# Patient Record
Sex: Female | Born: 1950 | Hispanic: Yes | Marital: Married | Smoking: Never smoker
Health system: Southern US, Community
[De-identification: ages and names within clinical notes are randomized; demographics above are authoritative.]

## PROBLEM LIST (undated history)

## (undated) DIAGNOSIS — I10 Essential (primary) hypertension: Secondary | ICD-10-CM

## (undated) DIAGNOSIS — E785 Hyperlipidemia, unspecified: Secondary | ICD-10-CM

## (undated) DIAGNOSIS — R112 Nausea with vomiting, unspecified: Secondary | ICD-10-CM

## (undated) DIAGNOSIS — E119 Type 2 diabetes mellitus without complications: Secondary | ICD-10-CM

## (undated) DIAGNOSIS — F419 Anxiety disorder, unspecified: Secondary | ICD-10-CM

## (undated) DIAGNOSIS — Z9889 Other specified postprocedural states: Secondary | ICD-10-CM

## (undated) DIAGNOSIS — K631 Perforation of intestine (nontraumatic): Secondary | ICD-10-CM

## (undated) DIAGNOSIS — G629 Polyneuropathy, unspecified: Secondary | ICD-10-CM

## (undated) DIAGNOSIS — K219 Gastro-esophageal reflux disease without esophagitis: Secondary | ICD-10-CM

## (undated) DIAGNOSIS — R1310 Dysphagia, unspecified: Secondary | ICD-10-CM

## (undated) DIAGNOSIS — F32A Depression, unspecified: Secondary | ICD-10-CM

## (undated) DIAGNOSIS — R262 Difficulty in walking, not elsewhere classified: Secondary | ICD-10-CM

## (undated) DIAGNOSIS — E876 Hypokalemia: Secondary | ICD-10-CM

## (undated) DIAGNOSIS — G459 Transient cerebral ischemic attack, unspecified: Secondary | ICD-10-CM

## (undated) DIAGNOSIS — J45909 Unspecified asthma, uncomplicated: Secondary | ICD-10-CM

## (undated) HISTORY — PX: TONSILLECTOMY: SUR1361

## (undated) HISTORY — DX: Depression, unspecified: F32.A

## (undated) HISTORY — PX: OOPHORECTOMY: SHX86

## (undated) HISTORY — DX: Hypokalemia: E87.6

## (undated) HISTORY — PX: BREAST BIOPSY: SHX20

## (undated) HISTORY — PX: OTHER SURGICAL HISTORY: SHX169

## (undated) HISTORY — PX: HYSTERECTOMY: SHX81

## (undated) HISTORY — PX: COLONOSCOPY, DIAGNOSTIC (SCREENING): SHX174

## (undated) HISTORY — PX: REDUCTION MAMMAPLASTY: SUR839

---

## 1996-08-20 ENCOUNTER — Emergency Department: Admit: 1996-08-20 | Payer: Self-pay | Source: Emergency Department | Admitting: Emergency Medicine

## 1998-01-18 ENCOUNTER — Inpatient Hospital Stay: Admit: 1998-01-18 | Disposition: A | Discharge status: Home / Self Care | Payer: Self-pay | Admitting: Family Medicine

## 2006-09-26 ENCOUNTER — Emergency Department
Admission: EM | Admit: 2006-09-26 | Disposition: A | Discharge status: Home / Self Care | Payer: Self-pay | Source: Emergency Department | Admitting: Emergency Medicine

## 2006-09-27 LAB — COMPREHENSIVE METABOLIC PANEL
ALT: 20 U/L (ref 7–56)
AST (SGOT): 31 U/L (ref 5–40)
Albumin, Synovial: 4.3 g/dL (ref 3.9–5.0)
Alkaline Phosphatase: 106 U/L (ref 38–126)
BUN / Creatinine Ratio: 19 (ref 8–20)
BUN: 14 mg/dL (ref 6–20)
Bilirubin, Total: 0.4 mg/dL (ref 0.2–1.3)
CO2: 28 mmol/L (ref 21.0–31.0)
Calcium: 9.1 mg/dL (ref 8.4–10.2)
Chloride: 103 mmol/L (ref 101–111)
Creatinine: 0.73 mg/dL (ref 0.5–1.4)
EGFR: >60 mL/min/{1.73_m2}
EGFR: >60 mL/min/{1.73_m2}
Glucose: 143 mg/dL — ABNORMAL HIGH (ref 70–100)
Potassium: 3.9 mmol/L (ref 3.6–5.0)
Protein, Total: 7.8 g/dL (ref 6.3–8.2)
Sodium: 139 mmol/L (ref 135–145)

## 2006-09-27 LAB — ^MANUAL DIFFERENTIAL MCKESSON
CELLS COUNTED: 100
Eosinophils Manual: 1 % (ref 0–6)
Lymphocytes Manual: 9 % — ABNORMAL LOW (ref 25–55)
Monocytes: 5 % (ref 1–8)
RBC Morphology: NORMAL
Segmented Neutrophils: 85 % — ABNORMAL HIGH (ref 49–69)

## 2006-09-27 LAB — D-DIMER, QUANTITATIVE: D-Dimer, Quant.: 219 ng/mL

## 2006-09-27 LAB — ^CBC WITH DIFF MCKESSON
Hematocrit: 39 % (ref 27.0–49.5)
Hemoglobin: 13.1 g/dL (ref 11.7–15.5)
MCH: 28.3 pg (ref 27.0–34.0)
MCHC: 33.6 % (ref 32.0–36.0)
MCV: 84.2 fL (ref 80–100)
Platelets: 129 10*3/uL — ABNORMAL LOW (ref 150–400)
RBC: 4.64 /mm3 (ref 3.80–5.40)
RDW: 14.3 % — ABNORMAL HIGH (ref 11.0–14.0)
WBC: 9.3 10*3/uL (ref 4.8–10.8)

## 2006-09-27 LAB — ^INFLUENZA A & B VIRUS ANTIGEN MCKESSON: Influenza A & B Antigen: NEGATIVE

## 2006-09-27 LAB — CK: Creatine Kinase (CK): 198 U/L (ref 19–204)

## 2006-09-27 LAB — ^TROPONIN I MCKESSON: Troponin: <0.04 ng/mL (ref 0.00–0.08)

## 2006-12-11 ENCOUNTER — Ambulatory Visit
Admission: RE | Admit: 2006-12-11 | Disposition: A | Discharge status: Home / Self Care | Payer: Self-pay | Source: Ambulatory Visit

## 2007-09-24 ENCOUNTER — Ambulatory Visit
Admission: RE | Admit: 2007-09-24 | Disposition: A | Discharge status: Home / Self Care | Payer: Self-pay | Source: Ambulatory Visit

## 2010-10-17 DIAGNOSIS — I639 Cerebral infarction, unspecified: Secondary | ICD-10-CM

## 2010-10-17 HISTORY — DX: Cerebral infarction, unspecified: I63.9

## 2010-11-18 ENCOUNTER — Inpatient Hospital Stay
Admission: EM | Admit: 2010-11-18 | Disposition: A | Discharge status: Rehabilitation Facility | Payer: Self-pay | Source: Emergency Department | Admitting: Internal Medicine

## 2010-11-18 ENCOUNTER — Emergency Department
Admission: EM | Admit: 2010-11-18 | Disposition: A | Discharge status: Other Acute Inpatient Hospital | Payer: Self-pay | Source: Emergency Department | Admitting: Emergency Medicine

## 2010-11-18 LAB — CBC AND DIFFERENTIAL
Baso(Absolute): 0.03 10*3/uL (ref 0.00–0.20)
Basophils: 0 % (ref 0–2)
Eosinophils Absolute: 0.06 10*3/uL (ref 0.00–0.70)
Eosinophils: 1 % (ref 0–5)
Hematocrit: 39.1 % (ref 37.0–47.0)
Hgb: 12.4 g/dL (ref 12.0–16.0)
Immature Granulocytes Absolute: 0.02 10*3/uL
Immature Granulocytes: 0 % (ref 0–1)
Lymphocytes Absolute: 1.89 10*3/uL (ref 0.50–4.40)
Lymphocytes: 21 % (ref 15–41)
MCH: 27.3 pg — ABNORMAL LOW (ref 28.0–32.0)
MCHC: 31.7 g/dL — ABNORMAL LOW (ref 32.0–36.0)
MCV: 85.9 fL (ref 80.0–100.0)
MPV: 14.1 fL — ABNORMAL HIGH (ref 9.4–12.3)
Monocytes Absolute: 0.41 10*3/uL (ref 0.00–1.20)
Monocytes: 5 % (ref 0–11)
Neutrophils Absolute: 6.49 10*3/uL
Neutrophils: 73 % (ref 52–75)
Platelets: 148 10*3/uL (ref 140–400)
RBC: 4.55 10*6/uL (ref 4.20–5.40)
RDW: 14 % (ref 12–15)
WBC: 8.9 10*3/uL (ref 3.50–10.80)

## 2010-11-18 LAB — URINALYSIS, REFLEX TO MICROSCOPIC EXAM IF INDICATED
Bilirubin, UA: NEGATIVE
Blood, UA: NEGATIVE
Glucose, UA: 70
Ketones UA: NEGATIVE
Leukocyte Esterase, UA: NEGATIVE
Nitrite, UA: NEGATIVE
Protein, UR: NEGATIVE
Specific Gravity UA POCT: 1.009 (ref 1.001–1.035)
Urine pH: 7 (ref 5.0–8.0)
Urobilinogen, UA: NORMAL mg/dL

## 2010-11-18 LAB — CBC
Hematocrit: 41.2 % (ref 37.0–47.0)
Hgb: 13.2 g/dL (ref 12.0–16.0)
MCH: 27.3 pg — ABNORMAL LOW (ref 28.0–32.0)
MCHC: 32 g/dL (ref 32.0–36.0)
MCV: 85.3 fL (ref 80.0–100.0)
Platelets: 144 10*3/uL (ref 140–400)
RBC: 4.83 10*6/uL (ref 4.20–5.40)
RDW: 14 % (ref 12–15)
WBC: 11.96 10*3/uL — ABNORMAL HIGH (ref 3.50–10.80)

## 2010-11-18 LAB — COMPREHENSIVE METABOLIC PANEL
ALT: 33 U/L (ref 3–36)
AST (SGOT): 28 U/L (ref 10–41)
Albumin/Globulin Ratio: 1.3 (ref 1.1–1.8)
Albumin: 4.1 g/dL (ref 3.4–4.9)
Alkaline Phosphatase: 92 U/L (ref 43–112)
BUN: 13 mg/dL (ref 8–20)
Bilirubin, Total: 0.4 mg/dL (ref 0.1–1.0)
CO2: 27 mEq/L (ref 21–30)
Calcium: 9 mg/dL (ref 8.6–10.2)
Chloride: 101 mEq/L (ref 98–107)
Creatinine: 0.6 mg/dL (ref 0.6–1.5)
Globulin: 3.1 g/dL (ref 2.0–3.7)
Glucose: 167 mg/dL — ABNORMAL HIGH (ref 70–100)
Potassium: 4.1 mEq/L (ref 3.6–5.0)
Protein, Total: 7.2 g/dL (ref 6.0–8.0)
Sodium: 138 mEq/L (ref 136–146)

## 2010-11-18 LAB — LIPID PANEL
Cholesterol / HDL Ratio: 4.9 Index
Cholesterol: 214 mg/dL — ABNORMAL HIGH (ref 0–199)
HDL: 44 mg/dL (ref >40)
LDL Calculated: 154 mg/dL — ABNORMAL HIGH (ref 0–99)
Triglycerides: 80 mg/dL (ref 34–149)
VLDL Calculated: 16 mg/dL (ref 10–40)

## 2010-11-18 LAB — TROPONIN I: Troponin I: 0 ng/mL (ref 0.00–0.09)

## 2010-11-18 LAB — I-STAT E3 CARTRIDGE
Hematocrit I-Stat: 41 % (ref 37.0–47.0)
Hemoglobin I-Stat: 13.9 g/dL (ref 12.0–16.0)
Potassium I-Stat: 3.9 mEq/L (ref 3.6–5.0)
Sodium I-Stat: 139 mEq/L (ref 136–146)

## 2010-11-18 LAB — RAPID DRUG SCREEN, URINE

## 2010-11-18 LAB — PT AND APTT F/C
PT INR: 1 (ref 0.9–1.1)
PT: 13.2 s (ref 12.6–15.0)
PTT: 26 s (ref 23–37)

## 2010-11-18 LAB — CK: Creatine Kinase (CK): 214 U/L — ABNORMAL HIGH (ref 20–140)

## 2010-11-18 LAB — PLATELET FUNCTION TEST
Collagen ADP Closure Time: 127 s — ABNORMAL HIGH (ref 46–105)
Collagen EPI Closure Time: 209 s — ABNORMAL HIGH (ref 70–184)

## 2010-11-18 LAB — HEMOLYSIS INDEX: Hemolysis Index: 1 Index (ref 0–9)

## 2010-11-18 LAB — GFR: EGFR: >60

## 2010-11-18 LAB — CKMB: Creatinine Kinase MB (CKMB): 1.5 ng/mL (ref 0.00–4.90)

## 2010-11-19 LAB — CBC AND DIFFERENTIAL
BASOPHILS %: 0.4 % (ref 0.0–2.0)
Baso(Absolute): 0.03 10*3/uL (ref 0.00–0.20)
Baso(Absolute): 0.02 10*3/uL (ref 0.00–0.20)
Basophils: 0 % (ref 0–2)
Eosinophils %: 1.1 % (ref 0.0–6.0)
Eosinophils Absolute: 0.09 10*3/uL — ABNORMAL LOW (ref 0.10–0.30)
Eosinophils Absolute: 0 10*3/uL (ref 0.00–0.70)
Eosinophils: 0 % (ref 0–5)
Hematocrit: 39.2 % (ref 27.0–49.5)
Hematocrit: 38.7 % (ref 37.0–47.0)
Hemoglobin: 13.1 g/dL (ref 11.7–15.5)
Hgb: 12.2 g/dL (ref 12.0–16.0)
Immature Granulocytes Absolute: 0.08 10*3/uL — ABNORMAL HIGH
Immature Granulocytes: 1 % (ref 0–1)
Lymphocytes Absolute: 2.86 10*3/uL (ref 1.00–4.80)
Lymphocytes Absolute: 1.7 10*3/uL (ref 0.50–4.40)
Lymphocytes Relative: 34.5 % (ref 25.0–55.0)
Lymphocytes: 14 % — ABNORMAL LOW (ref 15–41)
MCH: 28.5 pg (ref 27.0–34.0)
MCH: 27.4 pg — ABNORMAL LOW (ref 28.0–32.0)
MCHC: 33.4 g/dL (ref 32.0–36.0)
MCHC: 31.5 g/dL — ABNORMAL LOW (ref 32.0–36.0)
MCV: 85.4 fL (ref 80–100)
MCV: 86.8 fL (ref 80.0–100.0)
MPV: 14.2 fL — ABNORMAL HIGH (ref 9.0–13.0)
MPV: 13.6 fL — ABNORMAL HIGH (ref 9.4–12.3)
Monocytes Absolute: 0.54 10*3/uL (ref 0.10–1.20)
Monocytes Absolute: 0.46 10*3/uL (ref 0.00–1.20)
Monocytes Relative %: 6.5 % (ref 1.0–8.0)
Monocytes: 4 % (ref 0–11)
Neutrophils Absolute: 4.77 10*3/uL (ref 1.80–7.70)
Neutrophils Absolute: 9.64 10*3/uL
Neutrophils Relative %: 57.5 % (ref 49.0–69.0)
Neutrophils: 81 % — ABNORMAL HIGH (ref 52–75)
Platelets: 162 10*3/uL (ref 150–400)
Platelets: 154 10*3/uL (ref 140–400)
RBC: 4.59 M/uL (ref 3.80–5.40)
RBC: 4.46 10*6/uL (ref 4.20–5.40)
RDW: 13.5 % (ref 11.0–14.0)
RDW: 14 % (ref 12–15)
WBC: 8.29 10*3/uL (ref 4.80–10.80)
WBC: 11.9 10*3/uL — ABNORMAL HIGH (ref 3.50–10.80)

## 2010-11-19 LAB — SODIUM: Sodium: 144 mEq/L (ref 136–146)

## 2010-11-19 LAB — COMPREHENSIVE METABOLIC PANEL
ALT: 27 U/L (ref 7–56)
AST (SGOT): 24 U/L (ref 5–40)
Albumin, Synovial: 4.2 g/dL (ref 3.9–5.0)
Alkaline Phosphatase: 110 U/L (ref 38–126)
BUN / Creatinine Ratio: 20 (ref 8–20)
BUN: 13 mg/dL (ref 6–20)
Bilirubin, Total: 0.4 mg/dL (ref 0.2–1.3)
CO2: 33 mmol/L — ABNORMAL HIGH (ref 21.0–31.0)
Calcium: 9.3 mg/dL (ref 8.4–10.2)
Chloride: 98 mmol/L — ABNORMAL LOW (ref 101–111)
Creatinine: 0.65 mg/dL (ref 0.52–1.04)
EGFR: >60 mL/min/{1.73_m2}
EGFR: >60 mL/min/{1.73_m2}
Glucose: 135 mg/dL — ABNORMAL HIGH (ref 70–100)
Potassium: 4 mmol/L (ref 3.6–5.0)
Protein, Total: 7.3 g/dL (ref 6.3–8.2)
Sodium: 142 mmol/L (ref 135–145)

## 2010-11-19 LAB — TROPONIN I
Troponin I: <0.012 ng/mL (ref 0.000–0.034)
Troponin I: 0 ng/mL (ref 0.00–0.09)
Troponin I: 0 ng/mL (ref 0.00–0.09)

## 2010-11-19 LAB — BASIC METABOLIC PANEL
BUN: 9 mg/dL (ref 8–20)
CO2: 27 mEq/L (ref 21–30)
Calcium: 8.2 mg/dL — ABNORMAL LOW (ref 8.6–10.2)
Chloride: 106 mEq/L (ref 98–107)
Creatinine: 0.6 mg/dL (ref 0.6–1.5)
Glucose: 130 mg/dL — ABNORMAL HIGH (ref 70–100)
Potassium: 4.3 mEq/L (ref 3.6–5.0)
Sodium: 139 mEq/L (ref 136–146)

## 2010-11-19 LAB — GFR: EGFR: >60

## 2010-11-19 LAB — I-STAT E3 CARTRIDGE
Hematocrit I-Stat: 38 % (ref 37.0–47.0)
Hemoglobin I-Stat: 12.9 g/dL (ref 12.0–16.0)
Potassium I-Stat: 3.5 mEq/L — ABNORMAL LOW (ref 3.6–5.0)
Sodium I-Stat: 143 mEq/L (ref 136–146)

## 2010-11-19 LAB — CK
Creatine Kinase (CK): 191 U/L — ABNORMAL HIGH (ref 20–140)
Creatine Kinase (CK): 192 U/L — ABNORMAL HIGH (ref 20–140)

## 2010-11-19 LAB — PT (PROTHROMBIN TIME)
PT INR: 1
PT: 12.6 s (ref 11.6–14.1)

## 2010-11-19 LAB — PTT (PARTIAL THROMBOPLASTIN TIME): PTT: 26.9 s (ref 21.5–36.5)

## 2010-11-20 LAB — CBC AND DIFFERENTIAL
Baso(Absolute): 0.02 10*3/uL (ref 0.00–0.20)
Basophils: 0 % (ref 0–2)
Eosinophils Absolute: 0.01 10*3/uL (ref 0.00–0.70)
Eosinophils: 0 % (ref 0–5)
Hematocrit: 36.5 % — ABNORMAL LOW (ref 37.0–47.0)
Hgb: 11.3 g/dL — ABNORMAL LOW (ref 12.0–16.0)
Immature Granulocytes Absolute: 0.05 10*3/uL
Immature Granulocytes: 1 % (ref 0–1)
Lymphocytes Absolute: 1.46 10*3/uL (ref 0.50–4.40)
Lymphocytes: 14 % — ABNORMAL LOW (ref 15–41)
MCH: 27.7 pg — ABNORMAL LOW (ref 28.0–32.0)
MCHC: 31 g/dL — ABNORMAL LOW (ref 32.0–36.0)
MCV: 89.5 fL (ref 80.0–100.0)
MPV: 13.8 fL — ABNORMAL HIGH (ref 9.4–12.3)
Monocytes Absolute: 0.49 10*3/uL (ref 0.00–1.20)
Monocytes: 5 % (ref 0–11)
Neutrophils Absolute: 8.24 10*3/uL
Neutrophils: 80 % — ABNORMAL HIGH (ref 52–75)
Platelets: 152 10*3/uL (ref 140–400)
RBC: 4.08 10*6/uL — ABNORMAL LOW (ref 4.20–5.40)
RDW: 15 % (ref 12–15)
WBC: 10.27 10*3/uL (ref 3.50–10.80)

## 2010-11-20 LAB — BASIC METABOLIC PANEL
BUN: 7 mg/dL — ABNORMAL LOW (ref 8–20)
CO2: 28 mEq/L (ref 21–30)
Calcium: 7.7 mg/dL — ABNORMAL LOW (ref 8.6–10.2)
Chloride: 111 mEq/L — ABNORMAL HIGH (ref 98–107)
Creatinine: 0.6 mg/dL (ref 0.6–1.5)
Glucose: 136 mg/dL — ABNORMAL HIGH (ref 70–100)
Potassium: 4 mEq/L (ref 3.6–5.0)
Sodium: 144 mEq/L (ref 136–146)

## 2010-11-20 LAB — I-STAT E3 CARTRIDGE
Hematocrit I-Stat: 35 % — ABNORMAL LOW (ref 37.0–47.0)
Hemoglobin I-Stat: 11.9 g/dL — ABNORMAL LOW (ref 12.0–16.0)
Potassium I-Stat: 3.6 mEq/L (ref 3.6–5.0)
Sodium I-Stat: 145 mEq/L (ref 136–146)

## 2010-11-20 LAB — GFR: EGFR: >60

## 2010-11-21 LAB — BASIC METABOLIC PANEL
BUN: 8 mg/dL (ref 8–20)
CO2: 27 mEq/L (ref 21–30)
Calcium: 8.3 mg/dL — ABNORMAL LOW (ref 8.6–10.2)
Chloride: 104 mEq/L (ref 98–107)
Creatinine: 0.6 mg/dL (ref 0.6–1.5)
Glucose: 163 mg/dL — ABNORMAL HIGH (ref 70–100)
Potassium: 3.7 mEq/L (ref 3.6–5.0)
Sodium: 138 mEq/L (ref 136–146)

## 2010-11-21 LAB — CBC AND DIFFERENTIAL
Baso(Absolute): 0.04 10*3/uL (ref 0.00–0.20)
Basophils: 0 % (ref 0–2)
Eosinophils Absolute: 0.02 10*3/uL (ref 0.00–0.70)
Eosinophils: 0 % (ref 0–5)
Hematocrit: 40 % (ref 37.0–47.0)
Hgb: 12.4 g/dL (ref 12.0–16.0)
Immature Granulocytes Absolute: 0.18 10*3/uL — ABNORMAL HIGH
Immature Granulocytes: 1 % (ref 0–1)
Lymphocytes Absolute: 2.27 10*3/uL (ref 0.50–4.40)
Lymphocytes: 16 % (ref 15–41)
MCH: 27.2 pg — ABNORMAL LOW (ref 28.0–32.0)
MCHC: 31 g/dL — ABNORMAL LOW (ref 32.0–36.0)
MCV: 87.7 fL (ref 80.0–100.0)
MPV: 13.4 fL — ABNORMAL HIGH (ref 9.4–12.3)
Monocytes Absolute: 0.77 10*3/uL (ref 0.00–1.20)
Monocytes: 6 % (ref 0–11)
Neutrophils Absolute: 10.6 10*3/uL
Neutrophils: 76 % — ABNORMAL HIGH (ref 52–75)
Platelets: 161 10*3/uL (ref 140–400)
RBC: 4.56 10*6/uL (ref 4.20–5.40)
RDW: 14 % (ref 12–15)
WBC: 13.88 10*3/uL — ABNORMAL HIGH (ref 3.50–10.80)

## 2010-11-21 LAB — SODIUM
Sodium: 139 mEq/L (ref 136–146)
Sodium: 138 mEq/L (ref 136–146)

## 2010-11-21 LAB — GFR: EGFR: >60

## 2010-11-22 LAB — GFR: EGFR: >60

## 2010-11-22 LAB — CBC
Hematocrit: 37.9 % (ref 37.0–47.0)
Hgb: 12 g/dL (ref 12.0–16.0)
MCH: 27.7 pg — ABNORMAL LOW (ref 28.0–32.0)
MCHC: 31.7 g/dL — ABNORMAL LOW (ref 32.0–36.0)
MCV: 87.5 fL (ref 80.0–100.0)
MPV: 13.6 fL — ABNORMAL HIGH (ref 9.4–12.3)
Platelets: 137 10*3/uL — ABNORMAL LOW (ref 140–400)
RBC: 4.33 10*6/uL (ref 4.20–5.40)
RDW: 15 % (ref 12–15)
WBC: 10.8 10*3/uL (ref 3.50–10.80)

## 2010-11-22 LAB — BASIC METABOLIC PANEL
BUN: 8 mg/dL (ref 8–20)
CO2: 29 mEq/L (ref 21–30)
Calcium: 8 mg/dL — ABNORMAL LOW (ref 8.6–10.2)
Chloride: 106 mEq/L (ref 98–107)
Creatinine: 0.6 mg/dL (ref 0.6–1.5)
Glucose: 144 mg/dL — ABNORMAL HIGH (ref 70–100)
Potassium: 3.6 mEq/L (ref 3.6–5.0)
Sodium: 142 mEq/L (ref 136–146)

## 2010-11-23 LAB — URINALYSIS, REFLEX TO MICROSCOPIC EXAM IF INDICATED
Bilirubin, UA: NEGATIVE
Blood, UA: NEGATIVE
Glucose, UA: NEGATIVE
Ketones UA: NEGATIVE
Nitrite, UA: POSITIVE — AB
Protein, UR: NEGATIVE
Specific Gravity UA POCT: 1.01 (ref 1.001–1.035)
Urine pH: 6 (ref 5.0–8.0)
Urobilinogen, UA: NORMAL mg/dL

## 2010-11-23 LAB — CBC AND DIFFERENTIAL
Baso(Absolute): 0.02 10*3/uL (ref 0.00–0.20)
Basophils: 0 % (ref 0–2)
Eosinophils Absolute: 0.08 10*3/uL (ref 0.00–0.70)
Eosinophils: 1 % (ref 0–5)
Hematocrit: 38 % (ref 37.0–47.0)
Hgb: 11.9 g/dL — ABNORMAL LOW (ref 12.0–16.0)
Immature Granulocytes Absolute: 0.03 10*3/uL
Immature Granulocytes: 0 % (ref 0–1)
Lymphocytes Absolute: 2.36 10*3/uL (ref 0.50–4.40)
Lymphocytes: 25 % (ref 15–41)
MCH: 27.7 pg — ABNORMAL LOW (ref 28.0–32.0)
MCHC: 31.3 g/dL — ABNORMAL LOW (ref 32.0–36.0)
MCV: 88.4 fL (ref 80.0–100.0)
MPV: 13.8 fL — ABNORMAL HIGH (ref 9.4–12.3)
Monocytes Absolute: 0.81 10*3/uL (ref 0.00–1.20)
Monocytes: 8 % (ref 0–11)
Neutrophils Absolute: 6.31 10*3/uL
Neutrophils: 66 % (ref 52–75)
Platelets: 140 10*3/uL (ref 140–400)
RBC: 4.3 10*6/uL (ref 4.20–5.40)
RDW: 15 % (ref 12–15)
WBC: 9.61 10*3/uL (ref 3.50–10.80)

## 2010-11-23 LAB — BASIC METABOLIC PANEL
BUN: 11 mg/dL (ref 8–20)
CO2: 26 mEq/L (ref 21–30)
Calcium: 8.3 mg/dL — ABNORMAL LOW (ref 8.6–10.2)
Chloride: 104 mEq/L (ref 98–107)
Creatinine: 0.8 mg/dL (ref 0.6–1.5)
Glucose: 162 mg/dL — ABNORMAL HIGH (ref 70–100)
Potassium: 3.9 mEq/L (ref 3.6–5.0)
Sodium: 138 mEq/L (ref 136–146)

## 2010-11-23 LAB — GFR: EGFR: >60

## 2010-11-24 ENCOUNTER — Inpatient Hospital Stay
Admission: RE | Admit: 2010-11-24 | Disposition: A | Discharge status: Home / Self Care | Payer: Self-pay | Source: Other Acute Inpatient Hospital | Admitting: Hospice and Palliative Medicine

## 2010-11-24 LAB — CBC AND DIFFERENTIAL
Baso(Absolute): 0.02 10*3/uL (ref 0.00–0.20)
Basophils: 0 % (ref 0–2)
Eosinophils Absolute: 0.16 10*3/uL (ref 0.00–0.70)
Eosinophils: 2 % (ref 0–5)
Hematocrit: 37.8 % (ref 37.0–47.0)
Hgb: 11.9 g/dL — ABNORMAL LOW (ref 12.0–16.0)
Immature Granulocytes Absolute: 0.04 10*3/uL
Immature Granulocytes: 0 % (ref 0–1)
Lymphocytes Absolute: 2.29 10*3/uL (ref 0.50–4.40)
Lymphocytes: 22 % (ref 15–41)
MCH: 27.5 pg — ABNORMAL LOW (ref 28.0–32.0)
MCHC: 31.5 g/dL — ABNORMAL LOW (ref 32.0–36.0)
MCV: 87.5 fL (ref 80.0–100.0)
MPV: 13 fL — ABNORMAL HIGH (ref 9.4–12.3)
Monocytes Absolute: 0.74 10*3/uL (ref 0.00–1.20)
Monocytes: 7 % (ref 0–11)
Neutrophils Absolute: 7.1 10*3/uL
Neutrophils: 69 % (ref 52–75)
Platelets: 136 10*3/uL — ABNORMAL LOW (ref 140–400)
RBC: 4.32 10*6/uL (ref 4.20–5.40)
RDW: 15 % (ref 12–15)
WBC: 10.35 10*3/uL (ref 3.50–10.80)

## 2010-11-25 LAB — CBC
Hematocrit: 38.7 % (ref 37.0–47.0)
Hgb: 12.5 g/dL (ref 12.0–16.0)
MCH: 28.2 pg (ref 28.0–32.0)
MCHC: 32.3 g/dL (ref 32.0–36.0)
MCV: 87.4 fL (ref 80.0–100.0)
MPV: 12.8 fL — ABNORMAL HIGH (ref 9.4–12.3)
Platelets: 160 10*3/uL (ref 140–400)
RBC: 4.43 10*6/uL (ref 4.20–5.40)
RDW: 14 % (ref 12–15)
WBC: 9.28 10*3/uL (ref 3.50–10.80)

## 2010-11-25 LAB — URINALYSIS WITH MICROSCOPIC
Bilirubin, UA: NEGATIVE
Blood, UA: NEGATIVE
Glucose, UA: 50 — AB
Ketones UA: NEGATIVE
Nitrite, UA: NEGATIVE
Protein, UR: NEGATIVE
Specific Gravity UA POCT: 1.015 (ref 1.001–1.035)
Urine pH: 6 (ref 5.0–8.0)
Urobilinogen, UA: 2 mg/dL

## 2010-11-25 LAB — BASIC METABOLIC PANEL
BUN: 11 mg/dL (ref 7–21)
CO2: 31 mEq/L (ref 22–31)
Calcium: 9.3 mg/dL (ref 8.6–10.2)
Chloride: 98 mEq/L (ref 98–107)
Creatinine: 0.6 mg/dL (ref 0.5–1.4)
Glucose: 167 mg/dL — ABNORMAL HIGH (ref 70–100)
Potassium: 4.2 mEq/L (ref 3.6–5.0)
Sodium: 137 mEq/L (ref 136–143)

## 2010-11-25 LAB — HEMOGLOBIN A1C: Hemoglobin A1C: 8.1 % — ABNORMAL HIGH (ref 0.0–6.0)

## 2010-11-25 LAB — GFR: EGFR: >60

## 2010-12-03 LAB — CBC AND DIFFERENTIAL
Basophils Absolute Automated: 0.05 10*3/uL (ref 0.00–0.20)
Basophils Automated: 0 % (ref 0–2)
Eosinophils Absolute Automated: 0.08 10*3/uL (ref 0.00–0.70)
Eosinophils Automated: 1 % (ref 0–5)
Hematocrit: 38.6 % (ref 37.0–47.0)
Hgb: 12.3 g/dL (ref 12.0–16.0)
Immature Granulocytes Absolute: 0.02 10*3/uL
Immature Granulocytes: 0 % (ref 0–1)
Lymphocytes Absolute Automated: 2.9 10*3/uL (ref 0.50–4.40)
Lymphocytes Automated: 30 % (ref 15–41)
MCH: 27.6 pg — ABNORMAL LOW (ref 28.0–32.0)
MCHC: 31.9 g/dL — ABNORMAL LOW (ref 32.0–36.0)
MCV: 86.5 fL (ref 80.0–100.0)
MPV: 13.3 fL — ABNORMAL HIGH (ref 9.4–12.3)
Monocytes Absolute Automated: 0.69 10*3/uL (ref 0.00–1.20)
Monocytes: 7 % (ref 0–11)
Neutrophils Absolute: 5.9 10*3/uL (ref 1.80–8.10)
Neutrophils: 61 % (ref 52–75)
Nucleated RBC: 0 /100 WBC
Platelets: 183 10*3/uL (ref 140–400)
RBC: 4.46 10*6/uL (ref 4.20–5.40)
RDW: 14 % (ref 12–15)
WBC: 9.62 10*3/uL (ref 3.50–10.80)

## 2010-12-03 LAB — COMPREHENSIVE METABOLIC PANEL
ALT: 44 U/L (ref 21–72)
AST (SGOT): 22 U/L (ref 8–39)
Albumin/Globulin Ratio: 1.2 (ref 1.1–1.8)
Albumin: 3.7 g/dL (ref 3.7–5.1)
Alkaline Phosphatase: 76 U/L (ref 43–122)
BUN: 19 mg/dL (ref 7–21)
Bilirubin, Total: 0.4 mg/dL (ref 0.2–1.3)
CO2: 30 mEq/L (ref 22–31)
Calcium: 9.1 mg/dL (ref 8.6–10.2)
Chloride: 97 mEq/L — ABNORMAL LOW (ref 98–107)
Creatinine: 0.8 mg/dL (ref 0.5–1.4)
Globulin: 3 g/dL (ref 2.0–3.7)
Glucose: 138 mg/dL — ABNORMAL HIGH (ref 70–100)
Potassium: 4.3 mEq/L (ref 3.6–5.0)
Protein, Total: 6.7 g/dL (ref 6.0–8.0)
Sodium: 134 mEq/L — ABNORMAL LOW (ref 136–143)

## 2010-12-03 LAB — GFR: EGFR: >60

## 2010-12-20 ENCOUNTER — Emergency Department
Admission: EM | Admit: 2010-12-20 | Disposition: A | Discharge status: Home / Self Care | Payer: Self-pay | Source: Emergency Department | Admitting: Emergency Medicine

## 2010-12-22 LAB — CBC AND DIFFERENTIAL
BASOPHILS %: 0.2 % (ref 0.0–2.0)
Baso(Absolute): 0.02 10*3/uL (ref 0.00–0.20)
Eosinophils %: 1.4 % (ref 0.0–6.0)
Eosinophils Absolute: 0.13 10*3/uL (ref 0.10–0.30)
Hematocrit: 35.8 % (ref 27.0–49.5)
Hemoglobin: 12.1 g/dL (ref 11.7–15.5)
Lymphocytes Absolute: 2.76 10*3/uL (ref 1.00–4.80)
Lymphocytes Relative: 29.8 % (ref 25.0–55.0)
MCH: 28.9 pg (ref 27.0–34.0)
MCHC: 33.8 g/dL (ref 32.0–36.0)
MCV: 85.4 fL (ref 80–100)
MPV: 13.3 fL — ABNORMAL HIGH (ref 9.0–13.0)
Monocytes Absolute: 0.66 10*3/uL (ref 0.10–1.20)
Monocytes Relative %: 7.1 % (ref 1.0–8.0)
Neutrophils Absolute: 5.7 10*3/uL (ref 1.80–7.70)
Neutrophils Relative %: 61.5 % (ref 49.0–69.0)
Platelets: 164 10*3/uL (ref 150–400)
RBC: 4.19 M/uL (ref 3.80–5.40)
RDW: 13.5 % (ref 11.0–14.0)
WBC: 9.27 10*3/uL (ref 4.80–10.80)

## 2010-12-22 LAB — COMPREHENSIVE METABOLIC PANEL
ALT: 24 U/L (ref 7–56)
AST (SGOT): 18 U/L (ref 5–40)
Albumin, Synovial: 4.1 g/dL (ref 3.9–5.0)
Alkaline Phosphatase: 100 U/L (ref 38–126)
BUN / Creatinine Ratio: 23 — ABNORMAL HIGH (ref 8–20)
BUN: 17 mg/dL (ref 6–20)
Bilirubin, Total: 0.4 mg/dL (ref 0.2–1.3)
CO2: 29 mmol/L (ref 21.0–31.0)
Calcium: 9.4 mg/dL (ref 8.4–10.2)
Chloride: 100 mmol/L — ABNORMAL LOW (ref 101–111)
Creatinine: 0.72 mg/dL (ref 0.52–1.04)
EGFR: >60 mL/min/{1.73_m2}
EGFR: >60 mL/min/{1.73_m2}
Glucose: 112 mg/dL — ABNORMAL HIGH (ref 70–100)
Potassium: 4.1 mmol/L (ref 3.6–5.0)
Protein, Total: 6.8 g/dL (ref 6.3–8.2)
Sodium: 142 mmol/L (ref 135–145)

## 2010-12-22 LAB — URINALYSIS
Bilirubin, UA: NEGATIVE
Blood, UA: NEGATIVE
Glucose, UA: NEGATIVE
Ketones UA: NEGATIVE
Leukocyte Esterase, UA: NEGATIVE
Nitrate: NEGATIVE
Protein, UR: NEGATIVE
Specific Gravity, UR: 1.015 (ref 1.000–1.035)
Urobilinogen, UA: NORMAL
pH, Urine: 6 (ref 5.0–8.0)

## 2010-12-22 LAB — INFLUENZA ANTIGEN
Influenza A: NEGATIVE
Influenza B: NEGATIVE

## 2011-06-27 LAB — ECG 12-LEAD
Atrial Rate: 69 {beats}/min
P Axis: 46 degrees
P-R Interval: 168 ms
Q-T Interval: 414 ms
QRS Duration: 84 ms
QTC Calculation (Bezet): 443 ms
R Axis: 59 degrees
T Axis: 4 degrees
Ventricular Rate: 69 {beats}/min

## 2011-08-17 NOTE — H&P (Signed)
Emily Rowe, Emily Rowe      MRN:          [id number]      Account:      [id number]      Document ID:  [id number]                  Admit Date: 11/24/2010            Patient Location: M515-01      Patient Type: I            ATTENDING PHYSICIAN: Jonna Munro, DO                  REASON FOR ADMISSION:      Dysfunction of self-care, mobility, and speech following acute hemorrhagic      stroke.            ADMISSION HISTORY:      The patient is a 60 year old, right-handed, [place] female who has a      history of hypertension and hyperlipidemia, who had sudden onset of      right-sided weakness with CT scan showing left basal ganglia bleed for      which she was admitted to [facility] on November 18, 2010.  The      patient was evaluated by neurosurgery and not found to require surgical      intervention.  She had immediate control of her blood pressure with      medication and was monitored closely.  An MRI and MR angiogram were done,      which revealed intraparenchymal hemorrhage with no significant stenosis.      The patient had questionable diabetes during the hospital stay without      premorbid diagnosis.  She was found to have E. coli and Klebsiella urinary      tract infection and was started on IV Cipro transitioned to p.o. Cipro.            The patient was evaluated by acute therapies while at [facility] and was found to require moderate assist for supine to sit and      maximal assist for sit to stand and transfers.  The patient did do some      very short distance ambulation requiring maximum assist.  Her dressing and      self-care required minimal to moderate assistance.  She was found to have      dysarthria, aphasia, and anomia, but was not found to have any swallowing      deficits.  The patient appears as presented in the preadmission screening.            PAST MEDICAL HISTORY:      The patient has a past history significant for hypertension and      hypercholesterolemia  as noted above along with hysterectomy.            ALLERGIES:      The patient has an allergy to CODEINE and has previously been unable to      tolerate one of the STATINS because of increase in liver enzymes.            The patient was independent premorbidly in all areas of self-care and      mobility.  She does state that she was under a lot of stress caring for a      patient with Alzheimer disease in their home since May  2011.  The patient      has a supportive husband who was in good health.                                   Page 1 of 3      Emily Rowe, Emily Rowe      MRN:          [id number]      Account:      [id number]      Document ID:  [id number]                        REVIEW OF SYSTEMS:      She denies chest pain, shortness of breath, nausea, vomiting, or headache.      She has good control of her bowel and bladder and in fact had a bowel      movement this morning.            PHYSICAL EXAMINATION:      NEUROLOGIC:  The patient is found to be an awake and alert, heavyset,      60 year old female.  She has a mild right facial paresis and some      dysarthria.  The patient does not appear to have any difficulty with word      finding.  She shows good attention toward the right side.  There does not      appear to be a visual field cut.  Tongue is midline with a positive gag      reflex.      HEART:  Regular rate and rhythm.      LUNGS:  Clear.      ABDOMEN:  Soft with positive bowel sounds.      EXTREMITIES:  The patient has full active range of movement of her      nondominant left upper and left lower extremity.  In the right upper      extremity, the patient has low tone with some subluxation at the right      shoulder.  This area slightly painful, though the patient is not restricted      for passive ranging.  She has no active control in the right upper      extremity.  In the right lower extremity, the patient has normal tone      proximally and distally with minimal active hip flexion, but no other       active control.  She has full sensation in the upper and lower extremities.       There is no calf edema.  It is noted that the patient has already had a      venous Doppler study on admission to the rehabilitation unit, and this was      found to be a negative study.            DIAGNOSES:      1.  Dysfunction of self-care, mobility, communication, and possibly      high-level cognitive skills, status post left basal ganglia hemorrhage      secondary to hypertension, November 18, 2010.      2.  Hypertension.      3.  Hyperlipidemia.      4.  Probable diabetes.      5.  Mildly overweight status.      6.  History of hysterectomy.  7.  Right shoulder subluxation with discomfort.      8.  Escherichia coli and Klebsiella urinary tract infection.            RECOMMENDATIONS:      The patient will be admitted to [facility] for an      intensive inpatient interdisciplinary rehabilitation program, which will      include the following:  Physical and occupational therapy, speech therapy,      and rehabilitation nursing.  The patient will work on basic bed mobility                                   Page 2 of 3      Emily Rowe, Emily Rowe      MRN:          [id number]      Account:      [id number]      Document ID:  [id number]                  skills and short sitting to improve trunk control.  She will work with      facilitating active control through the right upper and right lower      extremities to weightbearing activities.  The patient will be assessed for      high-level cognitive deficits and further evaluated regarding her language      skills.  We will work on functional daily activities such as dressing and      bathing to facilitate right-sided return.  The patient will have, as she      proceeds to her therapy activities, close monitoring of her blood pressure.       We will be assessing patient's blood sugar and working with dietary in      this regard.  The patient will have hemoglobin A1c level  done.  We may also      check for thyroid dysfunction.  Neuropsychology will see the patient      regarding stress management.  Our estimated length of stay for this patient      is 2 to 3 weeks.                        Electronic Signing Provider            D:  11/25/2010 17:38 PM by Dr. Judeth Cornfield A. Cherrelle Plante, DO [phone])      T:  11/25/2010 18:12 PM by [id number]                  cc:                                   Page 3 of 3      Authenticated by Eulogio Bear. Shelia Kingsberry, DO [phone]) On 11/30/2010 02:19:48 PM

## 2011-08-17 NOTE — Discharge Summary (Signed)
Emily Rowe, Emily Rowe      MRN:          [id number]      Account:      [id number]      Document ID:  [id number]                  Admit Date: 11/24/2010      Discharge Date: 12/09/2010            ATTENDING PHYSICIAN:  Jonna Munro, DO                  REASON FOR ADMISSION:      Dysfunction of self-care and mobility following hemorrhagic stroke.            HISTORY OF PRESENT ILLNESS:      The patient is a 60 year old female who developed right-sided weakness on      November 18, 2010, with findings for left basal ganglia hemorrhage.  The      patient was treated acutely at [facility] where she also      received treatment for urinary tract infection and was found to have      questionable diabetes and accelerated blood pressure.  The patient was      transferred to [facility] for inpatient rehabilitation on      November 24, 2010, where she made the following progress:  ADLs with minimal      assist, transfers with minimal assist, and ambulation 50 feet with minimal      assistance, verbal cues and short base quad cane.  The patient will benefit      from ongoing physical and occupational therapy.  She was not felt to have      any difficulties with cognition or communication or swallow.            BLOOD SUGAR AND MEDICAL MANAGEMENT:      Blood sugar management:  The patient was started on Glucophage initially      500 mg b.i.d., although this dose caused her some nausea.  The dose was      reduced to 250 mg b.i.d. and the patient tolerated this well with fairly      good control of her blood sugars and fasting blood sugar on the day of      discharge 139.  The patient's blood pressure was in good range on several      medications 124/82.  She remains on medication for hyperlipidemia.  The      patient had the following laboratories at discharge:  CBC with a white      blood count of 9.62, hemoglobin 12.3, hematocrit 38.6, platelet count of      183, 000.  Normal electrolytes, BUN 19,  creatinine 0.8, and normal liver      enzymes.  The patient had a followup urinalysis which showed small      leukocyte esterase and some glucose, but with no growth on culture.  The      patient had baseline venous Doppler study on admission, which was negative      for DVT.            DISCHARGE MEDICATIONS:      Norvasc 5 mg p.o. daily, Lipitor 20 mg p.o. nightly, Pepcid 20 mg p.o. q.12      h., oretic 25 mg p.o. day, Lidoderm patch to the low back daily, Zestril 40      mg  p.o. day, Glucophage 250 mg p.o. b.i.d. with meals.            The patient has had a wheelchair donated and will have some home care      donated.  She will be following in a free clinic in [facility].  The      patient was discharged in stable condition.                                   Page 1 of 2      Emily Rowe, Emily Rowe      MRN:          [id number]      Account:      [id number]      Document ID:  [id number]                                    Electronic Signing Provider            D:  12/09/2010 10:00 AM by Dr. Judeth Cornfield A. Jasia Hiltunen, DO [phone])      T:  12/10/2010 08:58 AM by [email or web address]                        cc:                                   Page 2 of 2      Authenticated by Eulogio Bear. Letizia Hook, DO [phone]) On 12/10/2010 02:03:22 PM

## 2011-08-17 NOTE — Discharge Summary (Signed)
Emily Rowe, MCCALISTER      MRN:          [id number]      Account:      [id number]      Document ID:  [id number]                  Admit Date: 11/18/2010      Discharge Date: 11/24/2010            ATTENDING PHYSICIAN:  Burnis Kingfisher, MD                  CONSULTANTS:      Dr. Shawnee Knapp, MD and Dr. Nicoletta Dress, neurosurgery.            ADMISSION DIAGNOSES:      Dysarthria and right-sided weakness.            DISCHARGE DIAGNOSES:      1.  Left basal ganglia hemorrhage.      2.  Hypertension.      3.  Hyperlipidemia.      4.  Possible borderline diabetes.            PROCEDURES:      MRI of the brain showing intraparenchymal hemorrhage in the posterior      aspect of the right basil ganglia.  No significant intracranial or cervical      stenosis.            HOSPITAL COURSE:      The patient is a 60 year old female with a history of hypertension and      hyperlipidemia, who presented to [facility] on February 2 with      right lower extremity weakness and paresthesias.  She was taken initially      to [facility] and underwent a CT scan which showed a 2.7 x 1.8      left basal ganglia bleed.  She was transferred to [facility]      and admitted to the ICU.  She was acutely hypertensive to 200/88.  Her      blood pressure was controlled initially with a Cardene drip and she was      started on hypertonic saline in the ICU.  She was transferred out of the      ICU on February 4 and did well.  Her hypertonic saline was discontinued and      she continued on salt tablets which should be continued for 1 week.  Her      blood pressure was aggressively controlled on lisinopril and Norvasc.  She      was continued on a statin for hyperlipidemia.  Neurosurgery had been      consulted, but that there was no neurosurgical intervention needed.  Dr.      Adline Mango from neurology was also consulted and her care was recommended as      above.  Her residual deficits include significant right upper and lower       extremity weakness and right facial droop with dysarthria.  She will be      discharged to rehabilitation and follow up with neurology and her PCP, Dr.      Barton Dubois.            DISPOSITION:      Acute rehabilitation.  Page 1 of 3      [name], [name]      MRN:          [id number]      Account:      [id number]      Document ID:  [id number]                        ACTIVITY:      As tolerated.            DISCHARGE MEDICATIONS:      1.  Norvasc 5 mg p.o. daily.      2.  Lipitor 20 mg p.o. daily.      3.  Colace 100 mg p.o. b.i.d.      4.  Lovenox 40 mg subcutaneous daily.      5.  Pepcid 20 mg p.o. b.i.d.      6.  Lisinopril 40 mg p.o. daily.      7.  MiraLax 1 packet p.o. daily.      8.  Senna 2 tablets p.o. daily.      9.  Saline spray each nostril b.i.d.      10.  Salt tablets 2 grams p.o. q.6 h. x1 week.      11.  Tylenol as needed.      12.  Dulcolax suppository as needed.      13.  Ambien 5 mg p.o. nightly as needed for insomnia.            FOLLOWUP:      The patient will follow up with Dr. Adline Mango as needed for neurology and her      PCP, Dr. Barton Dubois in 1 to 2 weeks.            Time spent on discharge process including face-to-face time with patient      greater than 30 minutes.            ADDENDUM:            PLEASE ADDEND:      The patient's blood pressure medications were increased to improve her      blood pressure control and her Norvasc was increased to 10 mg and her      hydrochlorothiazide 25 mg was started.  She was also found to have evidence      of UTI with a urine culture pending at the time of this dictation, but she      was started initially on Cipro IV and transitioned to Cipro 500 mg p.o.      b.i.d. to finish a 7-day course.  She was discharged to rehabilitation.            R3: cad 11/26/10 (merged with docID [id number])                                    D:  11/23/2010 16:49 PM by Dr. Ileene Rubens. Emily Bering, MD ([id number])      T:  11/24/2010 11:38 AM by BP                                          Page 2 of 3      Emily Rowe      MRN:  [id number]      Account:      [id number]      Document ID:  [id number]                              IR:JJOACZ Claudette Laws MD      Barton Dubois MD      Shawnee Knapp MD                                   Page 3 of 3      Authenticated by Pilar Jarvis, MD ([id number]) On 12/06/2010 05:16:48 PM

## 2011-08-17 NOTE — H&P (Signed)
Emily Rowe, Emily Rowe      MRN:          [id number]      Account:      [id number]      Document ID:  [id number]                  Admit Date: 11/18/2010            Patient Location: [id number]      Patient Type: I            ATTENDING PHYSICIAN: Rennis Petty, MD                  ADMITTING COMPLAINT:      Dysarthria, right-sided weakness.            NEUROSURGEON:      Dr. Nicoletta Dress.            NEUROLOGIST:      Dr. Shawnee Knapp.            HISTORY OF PRESENT ILLNESS:      This [age] year old female with past medical history of hypertension,      hyperlipidemia, and questionable diabetes, presented with acute onset of      right lower extremity weakness and tingling later on she developed right      upper extremity weakness.  The patient also complained of dizziness and      lightheadedness.  She did not lose her consciousness.            At this time, the patient has dysarthria but able to communicate.  Denies      any headache, chest pain, shortness of breath, abdominal pain, dysuria,      hematuria, rash, joint pain.            REVIEW OF SYSTEMS:      As described above, all other 10-point review of systems is negative.            PAST MEDICAL HISTORY:      As described above.            SOCIAL HISTORY:      She denies any alcohol or drug abuse.  She denies any smoking.            FAMILY HISTORY:      The patient's mother died of heart attack at age of 2.  She was also      hypertensive and had a stroke.            ALLERGIES:      1.  CODEINE.      2.  Questionable allergy to STATINS.                                         Page 1 of 3      Rowe, Emily      MRN:          [id number]      Account:      [id number]      Document ID:  [id number]                  HOME MEDICATIONS:      The patient was taking lisinopril and Lipitor at home.            LABORATORY DATA:      WBC 10.2,  hemoglobin 11.3, platelets 152.  Sodium 145, potassium 3.6, BUN      7, creatinine 0.6.  Troponin negative x3.  Urinalysis within  normal limit.            My interpretation of CT of head is acute intraparenchymal hemorrhage.            A 2-D echocardiogram showed ejection fraction of 65% to 70%.            PHYSICAL EXAMINATION:      VITAL SIGNS:  Temperature 98 degrees F, blood pressure 145/70, respiratory      rate 18, oxygen saturation 99% on room air.      GENERAL:  The patient is awake, alert, oriented x3, lying comfortably in      bed, not in any distress.      HEAD:  Normocephalic, atraumatic.      NECK:  Supple, no thyromegaly.      EYES:  PERRLA, EOM intact.  No jaundice.      EARS, NOSE, AND THROAT:  No inflammation.  Moist mucosa.  Intact tympanic      membrane bilaterally.      RESPIRATORY:  Clear to auscultation bilaterally, normal respiratory effort.       No chest wall tenderness.  Percussion note resonant bilaterally.      ABDOMEN:  Bowel sounds present, soft, nontender, no hepatosplenomegaly.      CARDIOVASCULAR:  Regular rate and rhythm, no murmur, rubs, or gallops.  No      peripheral edema, no carotid bruit.      CENTRAL NERVOUS SYSTEM:  The cranial nerve II-XII are intact except right      facial nerve palsy.  The patient has right hemiplegia, Babinski upgoing on      the right side.      SKIN:  Normal skin temperature.  No rash, lesions, or ulcers.      MUSCULOSKELETAL:  No obvious muscle or joint tenderness or swelling.      Passive range of motion.  All joints within normal limit.      PSYCHIATRIC:  Awake, alert, oriented x3, normal affect.            ASSESSMENT AND PLAN:      1.  Acute hemorrhagic cerebrovascular accident.  No surgical intervention      needed.  Slowly bring her blood pressure to normal range.  I have started      her on low-dose lisinopril today.      2.  Hold antiplatelets and anticoagulants at this time.      3.  Neurology evaluation has been requested.      4.  The patient will need rehabilitation placement.  The plan was discussed      in detail with neurologist and the patient's family.  All of  them agree      with the plan.                                               Page 2 of 3      [name], [name]      MRN:          [id number]      Account:      [id number]      Document ID:  [id number]  Electronic Signing Provider            D:  11/20/2010 15:24 PM by Dr. Rennis Petty, MD ([id number])      T:  11/20/2010 23:09 PM by [id number]                  cc:                                   Page 3 of 3      Authenticated by Rennis Petty, MD ([id number]) On 11/21/2010 06:12:55 PM

## 2011-08-17 NOTE — H&P (Signed)
Emily Rowe, Emily Rowe      MRN:          [id number]      Account:      [id number]      Document ID:  [id number]                  Admit Date: 11/18/2010            Patient Location: [id number]      Patient Type: I            ATTENDING PHYSICIAN: Epimenio Foot, MD                  REASON FOR ADMISSION:      Intracranial hemorrhage.            HISTORY OF PRESENT ILLNESS:      The patient is a 60 year old female with past history of hypertension,      hyperlipidemia, and possibly diabetes who this afternoon while doing her      hair felt acute onset of right lower extremity weakness and tingling.      Subsequently, a right upper extremity weakness.  She was grabbing some      clothes and they gave way and started dropping then.  She subsequently felt      lightheaded and dizzy as she was going to pass out; however, she did not      lose any consciousness and was able to grab herself before falling.  She      did not have any significant trauma.  At that point, her family members      noticed some dysarthria and then subsequently called EMS took her to our      [facility].  At that time, she underwent a CT scan of the      head, which showed a 2.7 x 1.8 cm left basal ganglia bleed.  For that      reason, she was transferred here to [facility] for continued      neurosurgical intensive care.  The patient denies any other symptoms.  She      denies any anticoagulation or antiplatelet therapy.  She has never had any      episodes like this before and her neurologic defects have not changed; she      persists with right-sided hemiparesis and right facial droop and      dysarthria.            PAST MEDICAL HISTORY:      1.  Hypertension.      2.  Hyperlipidemia.      3.  Possible diabetes.  She has been told that she was diabetic once in the      past, however, in subsequent followup they told her she did not have it,      and she was temporarily on some medication for that and she has not taken      in the last  year.            PAST SURGICAL HISTORY:      Hysterectomy.            ALLERGIES:      She states that a STATIN a mild elevation in her LFTs, she is allergic to      CODEINE.  Page 1 of 4      Emily Rowe, Emily Rowe      MRN:          [id number]      Account:      [id number]      Document ID:  [id number]                  SOCIAL HISTORY:      The patient is originally from [place].  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  She denies any drinking, any smoking or recreational drug      use.            FAMILY HISTORY:      She refers her mother died of a heart attack at the age of 83.  She was      hypertensive and also had a history of strokes.            REVIEW OF SYSTEMS:      A 10-point review of systems was done and is otherwise negative.            PHYSICAL EXAMINATION:      VITAL SIGNS:  Shows a blood pressure of 200/88 with a MAP of 122, pulse of      105, respiratory rate of 18, pulse oximetry of 99% on 2 liters per minute.      GENERAL:  The patient is anxious and somewhat agitated.  She is very      scared, but she is alert.  She is oriented.  She is answering questions      appropriately; however, dysarthritic but she is making full sense.      HEENT:  The pupils are equal, round, reactive to light, anicteric sclerae,      pink conjunctivae.  Oropharynx clear, no exudates or erythema.      NECK:  Obese, no JVD is appreciated.  No carotid bruits.      CARDIAC:  Regular rate and rhythm, no murmurs, rubs, or gallops.      PULMONARY:  Clear lung auscultation bilaterally, no wheezing or any      rhonchi.      ABDOMEN:  Protuberant, soft, nontender, nondistended, no      hepatosplenomegaly.  Bowel sounds are normoactive.      EXTREMITIES:  No cyanosis, no clubbing, no edema.      NEUROLOGIC:  She has conjugated gaze.  She has positive corneals, positive      gag.  She has a right facial droop, tongue deviates to the right.  She has      some dysarthria.  She has  inability to raise her right shoulder.  She has      dense right-sided hemiparesis.  No sensory deficits.  Babinskis are      neutral.  The strength in her left extremities is 5/5, both proximal and      distally.            LABORATORY DATA:      Include white blood cell of 8.9, hemoglobin 12.4, hematocrit 39.1,      platelets of 148.  Sodium 138, potassium 4.1, chloride 101, serum      bicarbonate of 27, BUN of 13, creatinine 0.6, glucose 167, alkaline      phosphatase 92, ALT of 33, AST of 28.  INR 1, PT of 13.2.  EKG some T-wave      inversions in lateral  leads, otherwise no other ischemic changes.  She has      a CT of the head from our institution that shows a 2.4 x 1.5 cm left      lentiform nucleus with hemorrhage with some surrounding edema without      involvement of the ventricles, no herniation.            ASSESSMENT:                                   Page 2 of 4      Emily Rowe, Emily Rowe      MRN:          [id number]      Account:      [id number]      Document ID:  [id number]                  The patient is a 60 year old female presenting with acute onset of      right-sided hemiparesis, right facial droop and dysarthria and head imaging      compatible with a left basil ganglia intraparenchymal hemorrhage, likely      secondary to hypertension.            PLAN:      1.  Neurologic admission to the neurosurgical ICU for every 1 hour neuro      checks.  We will have tight control of her blood pressure.  Given that she      has received multiple p.r.n. antihypertensives that her systolic blood      pressure still exceed 200, we will start her on a Cardene drip for goal      systolic blood pressure 140 to 150.  Given her surrounding cerebral edema.      We will give her 3% saline for a goal sodium of 145 or above.  The patient      will need an MRI and MRA and possibly even a CAT scan tomorrow to assess      the evolution of her bleed.  She is not coagulopathic thrombocytopenic does      not take any  platelets, however, will check a platelet function assays.  We      will have tight control of fevers and hyperglycemia.  We will give      antiemetics and antitussives and bowel agents.  She will be kept n.p.o. and      her speech and swallow evaluation should be done in the morning.      2.  Cardiovascular:  Malignant hypertension that will be aggressive in      treating with a Cardene drip for systolic blood pressure 140 to 150 and a      mean arterial blood pressure of less than 110.  We will obtain serial      cardiac enzymes and echocardiogram as well as lipid profile and hemoglobin      A1c.      3.  Pulmonary:  Currently protecting her airway well.  The patient's mental      status deteriorates, she agrees to be intubated for airway protection.      4.  Renal:  We will monitor renal function and urine output and      electrolytes and replete as needed.      5.  Infectious disease:  Currently, there are no issues.  6.  Gastrointestinal:  The patient will be kept n.p.o. with Pepcid      prophylaxis and his speech and swallow evaluation will be assessed.      7.  Endocrine:  The patient will be kept on a sliding scale insulin for her      persistent hyperglycemia.  We will obtain hemoglobin A1c level.      8.  Hematologic:  We will monitor her blood counts.  She is not quite in      idiopathic with thrombocytopenia. We will do a platelet function assay.      9.  Prophylaxis:  The patient will be on sequential compession devices      prophylaxis as well as Pepcid.            DISPOSITION:      Full code.            CRITICAL CARE TIME SPENT:      65 minutes, excluding procedures.                                                     Page 3 of 4      Emily Rowe, Emily Rowe      MRN:          [id number]      Account:      [id number]      Document ID:  [id number]                              D:  11/18/2010 16:13 PM by Dr. Kirtland Bouchard. Barrie Folk, MD ([id number])      T:  11/18/2010 16:40 PM by [id number]                  cc:                                    Page 4 of 4      Authenticated by Adolm Joseph, MD ([id number]) On 12/06/2010 06:14:12 AM

## 2011-08-17 NOTE — Consults (Signed)
Emily Rowe, Emily Rowe      MRN:          [id number]      Account:      [id number]      Document ID:  [id number]      Service Date: 11/20/2010            Admit Date: 11/18/2010            Patient Location: [id number]      Patient Type: I            CONSULTING PHYSICIAN: Shawnee Knapp MD            REFERRING PHYSICIAN: Rennis Petty MD                  REASON FOR CONSULTATION:      Left-sided brain hemorrhage and right hemiplegia.            HISTORY OF PRESENT ILLNESS:      The patient is a 60 year old woman with history of hypertension,      hyperlipidemia, no definite history of diabetes who comes into the hospital      with acute onset of right-sided weakness and slurred speech.  CT scan at      the time of admission showed left basilar ganglia/external capsular      hemorrhage.  The patient had only mild mass effect.  She is currently in      neuro step-down unit.  She was asleep but easily aroused.  Her daughter is      at the bedside.  There is no previous history of strokes, heart attacks,      cancer.            PAST MEDICAL HISTORY:      Hypertension, dyslipidemia.  Patient denies any definite history of      diabetes.            PAST SURGICAL HISTORY:      Breast reduction and hysterectomy.            FAMILY HISTORY:      Positive for multiple strokes in father, it is unclear whether he had high      blood pressure.  Family is unsure whether he had high blood pressure.      Positive for heart attack in mother.            SOCIAL HISTORY:      Nonsmoker, nondrinker.  She is originally from [place].            PREADMISSION MEDICATIONS:      Those for hypertension.   Lipitor, Colace, Pepcid, Tylenol, Apresoline,      insulin.            ALLERGIES:      CODEINE.                                         Page 1 of 3      SELEEN, WALTER      MRN:          [id number]      Account:      [id number]      Document ID:  [id number]      Service Date: 11/20/2010            REVIEW OF SYSTEMS:      The patient denies  any chest  pain, breathing problem, abdominal pain,      diarrhea, headaches, confusion.  She has slurred speech and right-sided      weakness.            PHYSICAL EXAMINATION:      VITAL SIGNS:  Blood pressure 148/65, pulse 87, oxygen saturation 100%.      NEUROLOGIC:  Pleasant, cooperative, obese woman lying in bed.  Patient      easily arouses.  She knew her name.  She knew that she is in the hospital.      She could tell me the month and the year.  Her speech is dysarthric.  The      patient knew that she was in the hospital.  She knew the month and the      year.  She knew the name of the president.  Her speech is dysarthric.      Language parameters are normal.  She can follow commands.  She can count      fingers.  Extraocular movements are full.  There is prominent right facial      droop.  There is moderate degree of dysarthria.  The patient has fairly      dense flaccid right hemiplegia with 0/5 strength in the right arm and 0-1/5      in right leg.  Plantar is mute.  Sensations intact to touch and tickle.      Left side moves normally.      HEART:  Sounds normal, no murmurs, no carotid bruits.      NECK:  Supple.            REVIEW OF LABORATORY AND IMAGING DATA:      Hemoglobin 11.3; hematocrit 36.5; white cell count 10.27, platelet count      152,000.  Sodium 144, potassium 4.0, chloride 111, bicarbonate 28, BUN 7,      creatinine 0.6, random glucose 136.  Troponin 0.0.  The patient upon      admission had random glucose of 167.  Urine drug screen was negative.  INR      1, APTT 26.  UA essentially negative other than glucosuria.  CT brain scan      showed left subcortical external capsular hemorrhage, MR angiogram and MRI      does not show any underlying mass lesion.            IMPRESSION:      A 60 year old obese, hypertensive woman with history of hyperlipidemia,      probable new diagnosis of diabetes, comes in with acute left intracerebral      hemorrhage.  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  The patient has      dysarthria and fairly dense right hemiplegia.            RECOMMENDATIONS:      Physical and occupational therapy evaluation.  Deep venous thrombosis and      aspiration precautions, optimum control of blood pressure.  Continue      general medical care.  Neurosurgery already has been consulting on the      case.  The patient has had MRI and MRA results discussed as above.            Diagnosis, recommendations and plan of care discussed with the patient and      the daughter at the bedside.  Care coordinated with nursing staff and                                   Page 2 of 3      AIKAM, HELLICKSON      MRN:          [id number]      Account:      [id number]      Document ID:  [id number]      Service Date: 11/20/2010            referring physician.  Patient most likely will need acute rehabilitation.      I will follow patient clinically and make additional recommendations as      needed.                        Electronic Signing Provider            D:  11/20/2010 16:54 PM by Dr. Shawnee Knapp, MD ([id number])      T:  11/21/2010 15:37 PM by [id number]                  UX:[id number] Arshad MD                                   Page 3 of 3      Authenticated by Shawnee Knapp, MD ([id number]) On 12/12/2010 10:49:11 AM

## 2011-08-17 NOTE — Consults (Signed)
Emily Rowe, Emily Rowe      MRN:          [id number]      Account:      [id number]      Document ID:  [id number]      Service Date: 11/18/2010            Admit Date: 11/18/2010            Patient Location: [id number]-14      Patient Type: I            CONSULTING PHYSICIAN: Cheral Almas MD            REFERRING PHYSICIAN: Eden Emms MD                  CONSULTING SERVICE:      Neurosurgery            HISTORY OF PRESENT ILLNESS:      I was asked by Dr. Velda Shell of the [facility] service to see this 60 year old      right-handed woman with an intracranial hemorrhage.  Apparently, she      presented to [facility] with a right facial droop and weakness at      noon.  Workup there revealed some hemorrhage.  She was found to be      hypertensive at the outside hospital 220 systolic.  She has been relatively      stable since.  She does carry a diagnosis of hypertension.            ALLERGIES:      CODEINE.            PAST MEDICAL HISTORY:      Hypertension, hysterectomy.            MEDICATIONS:      Coreg, lisinopril, and Lipitor.            FAMILY HISTORY:      Positive for hypertension and stroke.            SOCIAL HISTORY:      Nonsmoker, nondrinker.  Family at bedside.            REVIEW OF SYSTEMS:      Positive for headache, weakness.            PHYSICAL EXAMINATION:      VITAL SIGNS:  Blood pressure now is 160/60, pulse of 80.      NEUROLOGIC:  She is awake, alert.  She has a right facial droop and right      pronator drift.  She has difficulty finding words.  Her arm is weaker than      her leg on the right side.  She does seem to understand quite well.  Pupils      are equal, reactive.  Cranial nerve exam other than the facial palsy                                   Page 1 of 2      RAVENNE, WAYMENT      MRN:          [id number]      Account:      [id number]      Document ID:  [id number]      Service Date: 11/18/2010            appears intact.  The facial  palsy is upper motor neuron type.            LABORATORY AND  DIAGNOSTIC DATA:      Review of the CT here shows an extreme capsule hemorrhage which goes into      the external capsule.  There is some local mass effect, no hydrocephalus,      no intraventricular hemorrhage.            IMPRESSION:      Hypertensive hemorrhage.            I think it deserves an MRI, MRA, but I do not suspect it will be anything      other than a hemorrhagic stroke.  I see no role for neurosurgical      intervention at this time.                        Electronic Signing Provider            D:  11/18/2010 21:12 PM by Dr. Okey Regal. Claudette Laws, MD ([id number])      T:  11/18/2010 23:51 PM by [facility]                  cc:                                   Page 2 of 2      Authenticated by Okey Regal. Claudette Laws, MD ([id number]) On 11/19/2010 04:40:37 PM

## 2011-10-04 NOTE — Procedures (Signed)
[facility]      ,            Transthoracic Echocardiogram      2D, M-mode, Doppler, and Color Doppler      Study date:  19-Nov-2010            Patient: Emily Rowe      MR #: [id number]      Account #: [id number]      DOB: 1951-09-02      Age: 60 years      Gender: Female      Height:      Weight:      BSA:            Sonographer:  Lyn Henri, RDCS, RVT      Cardiologist:  Glenetta Hew, MD            CLINICAL QUESTION: ICH            HISTORY: PRIOR HISTORY: HLD, HTN, fhx CVA, BP 114/89            PROCEDURE: The procedure was performed at the bedside. This was a routine      study. The transthoracic approach was used. The study included complete 2D      imaging, M-mode, complete spectral Doppler, and color Doppler. This was a      technically difficult study.            LEFT VENTRICLE: Midcavitary gradient noted (2.2 m/sec) likely due to      hyperdynamic state. Size was normal. Systolic function was vigorous. Ejection      fraction was estimated in the range of 65 % to 70 %. Although no diagnostic      regional wall motion abnormality was identified, this possibility cannot be      completely excluded on the basis of this study. Wall thickness was mildly      increased. Doppler: Doppler parameters were consistent with abnormal left      ventricular relaxation (grade 1 diastolic dysfunction).            AORTIC VALVE: The valve was probably trileaflet. Doppler: There was no      stenosis. There was no regurgitation.            AORTA: Aortic arch is normal in size. The root exhibited normal size.            MITRAL VALVE: Valve structure was normal. Doppler: There was trivial      regurgitation.            LEFT ATRIUM: Size was normal.            RIGHT VENTRICLE: The size was normal. Systolic function was normal. Wall      thickness was normal. Doppler: Systolic pressure was within the normal range.      Estimated peak pressure was in the range of 25 mmHg to 30 mmHg.            PULMONIC VALVE: Not well visualized.  Doppler: There was no stenosis. There was      no regurgitation.            TRICUSPID VALVE: The valve structure was normal. Doppler: There was trivial      regurgitation.            RIGHT ATRIUM: Size was normal.            SYSTEMIC VEINS: IVC: The inferior vena cava was normal in size.  PERICARDIUM: There was no pericardial effusion.            SUMMARY:            -  Procedure information:      -  This was a technically difficult study.            -  Left ventricle:      -  Size was normal.      -  Systolic function was vigorous. Ejection fraction was estimated in the range      of 65 % to 70 %. -  Although no diagnostic regional wall motion abnormality was      identified, this possibility cannot be completely excluded on the basis of this      study.      -  Wall thickness was mildly increased.      -  Doppler parameters were consistent with abnormal left ventricular relaxation      (grade 1 diastolic dysfunction).            -  Right ventricle:      -  The size was normal.      -  Systolic function was normal.      -  Wall thickness was normal.      -  Systolic pressure was within the normal range. Estimated peak pressure was      in the range of 25 mmHg to 30 mmHg.            COMPARISONS:      There is no prior study for comparison.            Prepared and signed by            Glenetta Hew, MD      Signed 19-Nov-2010 15:19:03                  (N: [id number])

## 2011-11-01 ENCOUNTER — Ambulatory Visit
Admission: RE | Admit: 2011-11-01 | Disposition: A | Discharge status: Home / Self Care | Payer: Self-pay | Source: Ambulatory Visit | Attending: Surgery | Admitting: Surgery

## 2011-11-04 NOTE — Op Note (Signed)
RYLAH, FUKUDA                                                    MRN:          [id number]                                                          Account:      [id number]                                                     Document ID:  [id number]                                               Procedure Date: 11/01/2011                                                                                    MRN: [id number]  Document ID: [id number]  Admit Date: 11/01/2011     Patient Location: DISCHARGED 11/01/2011  Patient Type: O     SURGEON: Osker Ayoub D Jamareon Shimel MD  ASSISTANT:          PREOPERATIVE DIAGNOSES:  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         TITLE OF PROCEDURE:  Left breast biopsy with preoperative mammographic needle localization.       ANESTHESIA:  TIVA, local.     ESTIMATED BLOOD LOSS:  Less than 30 mL.     CLINICAL HISTORY:  Emily Rowe is a 61 year old female who on mammography last year  was identified with an area of concerning calcifications and asymmetry.   Stereotactic biopsy demonstrated findings of atypical ductal hyperplasia as  well as intraductal papilloma in June of 2012.  Although, advised at the  time to undergo surgical excision, the patient's recent history was  complicated by a stroke and severe disability.  As she presented for  mammography, she was reminded that excision of this site was indicated.  As  she presented in the office, we had a detailed discussion regarding her  prior pathology results and reviewed the need to proceed with excision of  the area.  We discussed the anticipated procedure, excisional biopsy of the  area with preoperative needle localization, discussed benefits,  alternatives, risks of bleeding, infection, scarring, need for further  surgery.  She understood and wished to proceed.     DESCRIPTION OF PROCEDURE:  The patient  was taken  to the operating suite, placed in the supine position  and intravenous sedation and monitoring were provided by the anesthesia  department.  The left breast was prepped and draped in the usual sterile                                                                                                           Page 1 of 2  KARAGAN, LEHR                                                    MRN:          [id number]                                                          Account:      [id number]                                                     Document ID:  [id number]                                               Procedure Date: 11/01/2011                                                                                    fashion.  A supra-areolar transverse incision was created and dissection  carried through subcutaneous tissues sharply until the needle was isolated  and brought within the operative field.  Once the thick area of the Kopan's  needle was isolated, generous dissection to remove tissue surrounding the  tip of the localizing needle was performed.  The specimen was removed,  marked for orientation.  X-ray demonstrated the clip to be rather deep, but  was indeed within the specimen.  An additional segment of tissue deep to  the prior biopsy was removed, marked for orientation and submitted to gain  an additional margin surrounding the area of the clip.  The wound was  irrigated and hemostasis achieved with the cautery.  Local anesthetic of  0.25% Marcaine was infiltrated into  surgical sites for postoperative  analgesia and finally the wound was approximated with subcutaneous 3-0  Vicryl followed by subcuticular 5-0 Prolene.  Mastisol, Steri-Strips, and  dry dressings were applied.  The patient tolerated the procedure well, was  extubated in the operating room and taken to the recovery room in stable  condition.              D:  11/01/2011 21:50 PM by Jeanann Lewandowsky, MD ([id number])  T:  11/02/2011 11:29 AM by  Gwenyth Bender      Everlean Cherry: [id number]) (Doc ID: [id number])        cc:  [facility]                                                                                                            Page 2 of 2  Authenticated by Annamary Carolin, MD On 11/04/2011 04:28:13 PM

## 2012-07-13 LAB — ECG 12-LEAD
Atrial Rate: 78 {beats}/min
Atrial Rate: 67 {beats}/min
P Axis: 51 degrees
P Axis: 36 degrees
P-R Interval: 156 ms
P-R Interval: 158 ms
Q-T Interval: 400 ms
Q-T Interval: 420 ms
QRS Duration: 80 ms
QRS Duration: 82 ms
QTC Calculation (Bezet): 456 ms
QTC Calculation (Bezet): 443 ms
R Axis: 64 degrees
R Axis: 54 degrees
T Axis: -22 degrees
T Axis: 8 degrees
Ventricular Rate: 78 {beats}/min
Ventricular Rate: 67 {beats}/min

## 2012-09-03 ENCOUNTER — Other Ambulatory Visit: Payer: Self-pay

## 2012-09-03 ENCOUNTER — Ambulatory Visit
Admission: RE | Admit: 2012-09-03 | Discharge: 2012-09-03 | Disposition: A | Discharge status: Home / Self Care | Payer: PRIVATE HEALTH INSURANCE | Source: Ambulatory Visit

## 2012-09-03 DIAGNOSIS — I6350 Cerebral infarction due to unspecified occlusion or stenosis of unspecified cerebral artery: Secondary | ICD-10-CM | POA: Insufficient documentation

## 2012-11-22 ENCOUNTER — Emergency Department: Payer: Charity

## 2012-11-22 ENCOUNTER — Emergency Department: Payer: Self-pay

## 2012-11-22 ENCOUNTER — Emergency Department
Admission: EM | Admit: 2012-11-22 | Discharge: 2012-11-22 | Disposition: A | Discharge status: Home / Self Care | Payer: Charity | Attending: Emergency Medicine | Admitting: Emergency Medicine

## 2012-11-22 DIAGNOSIS — R5383 Other fatigue: Secondary | ICD-10-CM | POA: Insufficient documentation

## 2012-11-22 DIAGNOSIS — E119 Type 2 diabetes mellitus without complications: Secondary | ICD-10-CM | POA: Insufficient documentation

## 2012-11-22 DIAGNOSIS — Z8673 Personal history of transient ischemic attack (TIA), and cerebral infarction without residual deficits: Secondary | ICD-10-CM | POA: Insufficient documentation

## 2012-11-22 DIAGNOSIS — R209 Unspecified disturbances of skin sensation: Secondary | ICD-10-CM | POA: Insufficient documentation

## 2012-11-22 DIAGNOSIS — I10 Essential (primary) hypertension: Secondary | ICD-10-CM | POA: Insufficient documentation

## 2012-11-22 DIAGNOSIS — R51 Headache: Secondary | ICD-10-CM | POA: Insufficient documentation

## 2012-11-22 DIAGNOSIS — E785 Hyperlipidemia, unspecified: Secondary | ICD-10-CM | POA: Insufficient documentation

## 2012-11-22 HISTORY — DX: Hyperlipidemia, unspecified: E78.5

## 2012-11-22 HISTORY — DX: Type 2 diabetes mellitus without complications: E11.9

## 2012-11-22 HISTORY — DX: Essential (primary) hypertension: I10

## 2012-11-22 LAB — ECG 12-LEAD
Atrial Rate: 60 {beats}/min
P Axis: 39 degrees
P-R Interval: 152 ms
Q-T Interval: 438 ms
QRS Duration: 80 ms
QTC Calculation (Bezet): 438 ms
R Axis: 40 degrees
T Axis: 14 degrees
Ventricular Rate: 60 {beats}/min

## 2012-11-22 LAB — COMPREHENSIVE METABOLIC PANEL
ALT: 16 U/L (ref 0–55)
AST (SGOT): 22 U/L (ref 5–34)
Albumin/Globulin Ratio: 1.1 (ref 0.9–2.2)
Albumin: 3.9 g/dL (ref 3.5–5.0)
Alkaline Phosphatase: 80 U/L (ref 40–150)
Anion Gap: 10 (ref 5.0–15.0)
BUN: 12 mg/dL (ref 7.0–19.0)
Bilirubin, Total: 0.3 mg/dL (ref 0.2–1.2)
CO2: 26 mEq/L (ref 22–29)
Calcium: 9.5 mg/dL (ref 8.5–10.5)
Chloride: 102 mEq/L (ref 98–107)
Creatinine: 0.8 mg/dL (ref 0.6–1.0)
Globulin: 3.5 g/dL (ref 2.0–3.6)
Glucose: 87 mg/dL (ref 70–100)
Potassium: 3.5 mEq/L (ref 3.5–5.1)
Protein, Total: 7.4 g/dL (ref 6.0–8.3)
Sodium: 138 mEq/L (ref 136–145)

## 2012-11-22 LAB — CBC AND DIFFERENTIAL
Basophils Absolute Automated: 0.03 10*3/uL (ref 0.00–0.20)
Basophils Automated: 0 %
Eosinophils Absolute Automated: 0.03 10*3/uL (ref 0.00–0.70)
Eosinophils Automated: 0 %
Hematocrit: 38.8 % (ref 37.0–47.0)
Hgb: 12.9 g/dL (ref 12.0–16.0)
Lymphocytes Absolute Automated: 2.29 10*3/uL (ref 0.50–4.40)
Lymphocytes Automated: 28 %
MCH: 28.5 pg (ref 28.0–32.0)
MCHC: 33.2 g/dL (ref 32.0–36.0)
MCV: 85.8 fL (ref 80.0–100.0)
MPV: 12.7 fL — ABNORMAL HIGH (ref 9.4–12.3)
Monocytes Absolute Automated: 0.53 10*3/uL (ref 0.00–1.20)
Monocytes: 6 %
Neutrophils Absolute: 5.29 10*3/uL (ref 1.80–8.10)
Neutrophils: 65 %
Platelets: 160 10*3/uL (ref 140–400)
RBC: 4.52 10*6/uL (ref 4.20–5.40)
RDW: 14 % (ref 12–15)
WBC: 8.17 10*3/uL (ref 3.50–10.80)

## 2012-11-22 LAB — URINALYSIS
Bilirubin, UA: NEGATIVE
Blood, UA: NEGATIVE
Glucose, UA: NEGATIVE
Ketones UA: NEGATIVE
Leukocyte Esterase, UA: NEGATIVE
Nitrite, UA: NEGATIVE
Protein, UR: NEGATIVE
Specific Gravity UA: 1.006 (ref 1.001–1.035)
Urine pH: 6 (ref 5.0–8.0)
Urobilinogen, UA: 0.2 mg/dL

## 2012-11-22 LAB — POCT GLUCOSE: Whole Blood Glucose POCT: 75 mg/dL (ref 70–100)

## 2012-11-22 LAB — GFR: EGFR: >60

## 2012-11-22 MED ORDER — DEXTROSE 50 % IV SOLN
25.0000 mL | Freq: Once | INTRAVENOUS | Status: AC
Start: 2012-11-22 — End: 2012-11-22
  Administered 2012-11-22: 25 mL via INTRAVENOUS
  Filled 2012-11-22: qty 50

## 2012-11-22 NOTE — ED Notes (Signed)
Pt placed on 2 Liters O2 per Dr.Kugler.

## 2012-11-22 NOTE — ED Notes (Signed)
Headache, dizziness, bilateral upper and lower extremity numbness and fatigue on/off onset 1 week ago with headache becoming more severe at 1000 this am. [facility] and [facility]. Patient arrive A&OX3 moving all extremities with clear speech.

## 2012-11-22 NOTE — ED Provider Notes (Signed)
Physician/Midlevel provider first contact with patient: 11/22/12 1150         History     Chief Complaint   Patient presents with   . Headache   . Fatigue   . Dizziness   . Numbness     HPI Comments: Generalized weakness.    Patient is a 62 y.o. female presenting with headaches. The history is provided by the patient.   Headache  The primary symptoms include headaches and dizziness. Primary symptoms do not include syncope, loss of consciousness, altered mental status, seizures, visual change, focal weakness, loss of sensation, speech change, fever, nausea or vomiting. Episode onset: 1 week. Worse since yesterday. The symptoms are worsening. The neurological symptoms are diffuse.   Location: diffuse. The headache is associated with weakness. The headache is not associated with photophobia, eye pain, visual change or neck stiffness.   Dizziness also occurs with weakness. Dizziness does not occur with nausea, vomiting or diaphoresis.   Additional symptoms include weakness. Additional symptoms do not include neck stiffness, pain, photophobia or nystagmus. Medical issues also include cerebral vascular accident, diabetes and hypertension.       Past Medical History   Diagnosis Date   . Hypertensive disorder    . Diabetes    . Stroke    . Hyperlipemia        Past Surgical History   Procedure Date   . Hysterectomy    . Tonsillectomy    . Reduction mammaplasty    . Urinary bladder        History reviewed. No pertinent family history.    Social  History   Substance Use Topics   . Smoking status: Never Smoker    . Smokeless tobacco: Not on file   . Alcohol Use: No       .     Allergies   Allergen Reactions   . Codeine    . Glucosamine        Current/Home Medications    EZETIMIBE (ZETIA) 10 MG TABLET    Take 10 mg by mouth daily.    GLIPIZIDE (GLUCOTROL) 10 MG TABLET    Take 20 mg by mouth 2 (two) times daily before meals.    HYDROCHLOROTHIAZIDE (HYDRODIURIL) 25 MG TABLET    Take 25 mg by mouth daily.    LISINOPRIL  (PRINIVIL,ZESTRIL) 40 MG TABLET    Take 40 mg by mouth 2 (two) times daily.        Review of Systems   Constitutional: Negative for fever and diaphoresis.   HENT: Negative for ear pain, congestion, sore throat, neck pain and neck stiffness.    Eyes: Negative for photophobia, pain and visual disturbance.   Respiratory: Negative for cough, chest tightness, shortness of breath and wheezing.    Cardiovascular: Negative for chest pain, palpitations, leg swelling and syncope.   Gastrointestinal: Negative for nausea, vomiting, abdominal pain, diarrhea and constipation.   Genitourinary: Negative for dysuria and urgency.   Musculoskeletal: Negative for back pain and gait problem.   Skin: Negative for color change and rash.   Neurological: Positive for dizziness, weakness and headaches. Negative for speech change, focal weakness, seizures and loss of consciousness.   Psychiatric/Behavioral: Negative for confusion and altered mental status. The patient is not nervous/anxious.        Physical Exam    BP 183/88  Pulse 63  Resp 16  SpO2 100%    Physical Exam   Nursing note and vitals reviewed.  Constitutional: She is oriented  to person, place, and time. She appears well-developed and well-nourished.   HENT:   Head: Normocephalic and atraumatic.   Right Ear: External ear normal.   Left Ear: External ear normal.   Mouth/Throat: Oropharynx is clear and moist. No oropharyngeal exudate.   Eyes: Conjunctivae normal are normal. Pupils are equal, round, and reactive to light. Right eye exhibits no discharge. Left eye exhibits no discharge. No scleral icterus.   Neck: Normal range of motion. Neck supple. No JVD present. No tracheal deviation present.   Cardiovascular: Normal rate, regular rhythm and normal heart sounds.    No murmur heard.  Pulmonary/Chest: Effort normal and breath sounds normal. She has no wheezes. She exhibits no tenderness.   Abdominal: Soft. Bowel sounds are normal. She exhibits no distension. There is no  tenderness. There is no guarding.   Musculoskeletal: She exhibits no edema and no tenderness.   Neurological: She is alert and oriented to person, place, and time. Coordination normal. GCS eye subscore is 4. GCS verbal subscore is 5. GCS motor subscore is 6.        Mild R sided weakness   Skin: Skin is warm and dry. She is not diaphoretic. No pallor.   Psychiatric: She has a normal mood and affect. Her behavior is normal. Judgment normal.       MDM and ED Course     ED Medication Orders      Start     Status Ordering Provider    11/22/12 1315   dextrose 50 % bolus 25 mL   Once      Route: Intravenous  Ordered Dose: 25 mL         Last MAR action:  Given Ruchama Kubicek                 MDM  Number of Diagnoses or Management Options  Fatigue:   Headache:   Tingling:   Diagnosis management comments: Diagnosis management comments: Oxygen saturation by pulse oximetry is 95%-100%, Normal.  Interventions: None Needed.    Oxygen saturation by pulse oximetry is 95%-100%, Normal.  Interventions: None Needed.    Attending Dr. Melvern Sample    [phone]  EKG Interpretation  EKG interpreted by ED physician  Rate: Normal for age.  Rhythm: Normal sinus rhythm  Axis: Normal for age  PR, QRS and QT intervals:  normal for age and rate  ST Segments: No deviations suggestive of ischemia  Impression: Normal ECG with no evidence of ischemia.    Attending : Dr. Melvern Sample          Diff dx: CVA, TIA, anxiety, migraine, ICB (Doubt) electrolyte disorder, hypoglycemia, UTI.    Reassessment of Tommi Crepeau feeling better. Symptoms are improved. Stable for discharge.    TPA not given for the following reasons: Not CVA    I have reviewed the nursing history.    I have reviewed all available xrays and find the following results: CXR - NAP    In addition, in the [facility] EMR times orders such as fluids, iv boluses, and any medication order including drips 30 into the future (These times are not able to be corrected by the physician). Therefore, orders  appear to be placed 30 min after they are actually placed. All orders and meds in the ED are carried out STAT.    DR. Melvern Sample  is the primary attending for this patient and has obtained and performed the history, PE, and medical decision making for this  patient.      Results     Procedure Component Value Units Date/Time    Urinalysis ([id number]) Collected:11/22/12 1454    Specimen Information:Urine Updated:11/22/12 1535     Urine Type Clean Catch      Color, UA YELLOW      Clarity, UA CLEAR      Specific Gravity UA 1.006      Urine pH 6.0      Leukocytes, UA NEGATIVE      Nitrite, UA NEGATIVE      Protein, UA NEGATIVE      Glucose, UA NEGATIVE      Ketones UA NEGATIVE      Urobilinogen, UA 0.2 mg/dL      Bilirubin, UA NEGATIVE      Blood, UA NEGATIVE     Comprehensive metabolic panel ([id number]) Collected:11/22/12 1229    Specimen Information:Blood Updated:11/22/12 1304     Glucose 87 mg/dL      BUN 78.2 mg/dL      Creatinine 0.8 mg/dL      Sodium 956 mEq/L      Potassium 3.5 mEq/L      Chloride 102 mEq/L      CO2 26 mEq/L      Calcium 9.5 mg/dL      Protein, Total 7.4 g/dL      Albumin 3.9 g/dL      AST (SGOT) 22 U/L      ALT 16 U/L      Alkaline Phosphatase 80 U/L      Bilirubin, Total 0.3 mg/dL      Globulin 3.5 g/dL      Albumin/Globulin Ratio 1.1      Anion Gap 10.0     GFR ([id number]) Collected:11/22/12 1229     EGFR >60.0 Updated:11/22/12 1304    Accucheck ([id number]) Collected:11/22/12 1239     POCT Glucose WB 75 mg/dL UXLKGMW:08/12/24 3664    CBC and differential ([id number])  (Abnormal) Collected:11/22/12 1229    Specimen Information:Blood / Blood Updated:11/22/12 1244     WBC 8.17 x10 3/uL      RBC 4.52 x10 6/uL      Hgb 12.9 g/dL      Hematocrit 56.3 %      MCV 85.8 fL      MCH 28.5 pg      MCHC 33.2 g/dL      RDW 14 %      Platelets 160 x10 3/uL      MPV 12.7 (H) fL      Neutrophils 65 %      Lymphocytes Automated 28 %      Monocytes 6 %      Eosinophils Automated 0 %      Basophils Automated 0 %       Immature Granulocyte Unmeasured %      Nucleated RBC Unmeasured /100 WBC      Neutrophils Absolute 5.29 x10 3/uL      Abs Lymph Automated 2.29 x10 3/uL      Abs Mono Automated 0.53 x10 3/uL      Abs Eos Automated 0.03 x10 3/uL      Absolute Baso Automated 0.03 x10 3/uL      Absolute Immature Granulocyte Unmeasured x10 3/uL         Radiology Results (24 Hour)     Procedure Component Value Units Date/Time    Chest AP Portable ([id number]) Collected:11/22/12 1220    Order Status:Completed  Updated:11/22/12 1248    Narrative:  Clinical History: Weakness.    Findings: AP view of the chest. Compared with prior studies including  11/19/2010. Interval removal of previously seen central venous catheter.  Tortuosity of the thoracic aorta. Cardiac silhouette is prominent but  magnified by AP technique and stable to less pronounced than previously.  Pulmonary vascularity is normal. No focal consolidation.     Impression:    Impression: No evidence of acute cardiopulmonary disease.    CT Head WO Contrast ([id number]) Collected:11/22/12 1203    Order Status:Completed  Updated:11/22/12 1209    Narrative:    HISTORY: Headache, numbness.    FINDINGS: Brain CT without intravenous contrast. Correlation with the  brain CT dated December 20, 2010.    There is a small area of encephalomalacia in the left basal ganglia and  corona radiata consistent with evolution of previously seen hemorrhage.  The brain parenchyma itself is otherwise unremarkable. There is no mass  effect, acute intracranial hemorrhage. The ventricular system and  cisterns are normally configured. There are postoperative changes in the  nasal cavity. There is mucosal thickening along the residual ethmoid air  cells.     Impression:     1. 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  2. Postoperative and inflammatory changes in the paranasal sinuses.               Amount and/or Complexity of Data Reviewed  Clinical lab tests:  ordered and reviewed  Tests in the radiology section of CPT: ordered and reviewed  Tests in the medicine section of CPT: ordered and reviewed          Procedures    Clinical Impression & Disposition     Clinical Impression  Final diagnoses:   Tingling   Headache   Fatigue        ED Disposition     Discharge Hoyle Barr discharge to home/self care.    Condition at discharge: Good             New Prescriptions    No medications on file               Melvern Sample, DO  11/22/12 1556

## 2012-11-22 NOTE — Discharge Instructions (Signed)
Weakness / Fatigue    You have been seen today for generalized weakness. This may also be described as fatigue.    Weakness is a common problem, especially in older individuals.    It is important to understand the difference between true weakness (real weakness from a nerve or brain problem) and the more common problem of fatigue. These words might seem similar but they do mean very different problems.   Fatigue: When a person is describing fatigue, they may feel tired out very quickly even with just a little activity. They may also say they are feeling tired, sleepy, easily exhausted and unable to do normal daily activities because they don't seem to have enough energy.   True Weakness: When someone has true weakness, it means that the muscles are not working right. For example, a leg might be truly weak if you can t support your weight on it or if you can't get up from a chair because the thigh muscles aren t strong enough.    There are many causes of weakness including; Infections (often kidney/bladder infections or pneumonias), electrolyte abnormalities (low sodium, low potassium), depression, and neurologic (brain or nerve) disorders.    After looking at the results of the blood tests or X-rays, the cause of your weakness is:   Unclear or unknown.    It is VERY IMPORTANT to see your primary care doctor. More testing may be needed to figure out the cause of your weakness.    YOU SHOULD SEEK MEDICAL ATTENTION IMMEDIATELY, EITHER HERE OR AT THE NEAREST EMERGENCY DEPARTMENT, IF ANY OF THE FOLLOWING OCCURS:   Confusion, coma, agitation (becoming anxious or irritable).   Fever, vomiting.   Severe headache.   Signs of stroke (paralysis or numbness on one side of the body, drooping on one side of the face, difficulty talking).   Worsening weakness, difficulty standing, paralysis, loss of control of the bladder or bowels or difficulty swallowing.      Headache    You have been treated for a  headache.    Headaches are very common. Most of the time they are benign (not harmful). Some headaches can be very serious. Your headache appears to be benign. The doctor feels it is safe for you to go home.    If you continue to have headaches, or if this headache does not resolve over the next few days, you should be evaluated by your regular doctor or a neurologist. Keep a "headache diary." This may help your doctor learn the cause of your headaches.    Take your headache medication as directed. This is especially important if your doctor has placed you on a daily medication to prevent headaches.    YOU SHOULD SEEK MEDICAL ATTENTION IMMEDIATELY, EITHER HERE OR AT THE NEAREST EMERGENCY DEPARTMENT, IF ANY OF THE FOLLOWING OCCURS:   Your headache gets worse.   You have a severe headache that occurs suddenly.   Your head pain is different from your normal headache.   You have a fever, especially with a stiff neck.   You feel numbness, tingling, or weakness in your arms or legs.   You pass out.   You have problems with your vision.   You vomit and have trouble taking medication or keeping it down.       Will need neuro referral.    Tylenol for pain.

## 2013-11-20 DIAGNOSIS — E7800 Pure hypercholesterolemia, unspecified: Secondary | ICD-10-CM | POA: Insufficient documentation

## 2013-12-31 ENCOUNTER — Ambulatory Visit: Payer: Self-pay

## 2013-12-31 ENCOUNTER — Other Ambulatory Visit
Admission: RE | Admit: 2013-12-31 | Discharge: 2013-12-31 | Disposition: A | Discharge status: Home / Self Care | Payer: Medicare Other | Source: Ambulatory Visit | Attending: Gastroenterology | Admitting: Gastroenterology

## 2013-12-31 DIAGNOSIS — R112 Nausea with vomiting, unspecified: Secondary | ICD-10-CM | POA: Insufficient documentation

## 2013-12-31 DIAGNOSIS — K209 Esophagitis, unspecified without bleeding: Secondary | ICD-10-CM

## 2013-12-31 DIAGNOSIS — Z1211 Encounter for screening for malignant neoplasm of colon: Secondary | ICD-10-CM | POA: Insufficient documentation

## 2013-12-31 DIAGNOSIS — K2960 Other gastritis without bleeding: Secondary | ICD-10-CM | POA: Insufficient documentation

## 2013-12-31 DIAGNOSIS — K5730 Diverticulosis of large intestine without perforation or abscess without bleeding: Secondary | ICD-10-CM | POA: Insufficient documentation

## 2013-12-31 DIAGNOSIS — K649 Unspecified hemorrhoids: Secondary | ICD-10-CM | POA: Insufficient documentation

## 2013-12-31 DIAGNOSIS — D126 Benign neoplasm of colon, unspecified: Secondary | ICD-10-CM | POA: Insufficient documentation

## 2013-12-31 DIAGNOSIS — K2289 Other specified disease of esophagus: Secondary | ICD-10-CM | POA: Insufficient documentation

## 2013-12-31 DIAGNOSIS — D131 Benign neoplasm of stomach: Secondary | ICD-10-CM | POA: Insufficient documentation

## 2014-01-01 LAB — LAB USE ONLY - HISTORICAL SURGICAL PATHOLOGY

## 2015-08-21 ENCOUNTER — Emergency Department: Payer: Medicare Other

## 2015-08-21 ENCOUNTER — Emergency Department
Admission: EM | Admit: 2015-08-21 | Discharge: 2015-08-21 | Disposition: A | Discharge status: Home / Self Care | Payer: Medicare Other | Attending: Emergency Medicine | Admitting: Emergency Medicine

## 2015-08-21 DIAGNOSIS — E785 Hyperlipidemia, unspecified: Secondary | ICD-10-CM | POA: Insufficient documentation

## 2015-08-21 DIAGNOSIS — Z7984 Long term (current) use of oral hypoglycemic drugs: Secondary | ICD-10-CM | POA: Insufficient documentation

## 2015-08-21 DIAGNOSIS — R42 Dizziness and giddiness: Secondary | ICD-10-CM | POA: Insufficient documentation

## 2015-08-21 DIAGNOSIS — E119 Type 2 diabetes mellitus without complications: Secondary | ICD-10-CM | POA: Insufficient documentation

## 2015-08-21 DIAGNOSIS — Z8673 Personal history of transient ischemic attack (TIA), and cerebral infarction without residual deficits: Secondary | ICD-10-CM | POA: Insufficient documentation

## 2015-08-21 DIAGNOSIS — Z91040 Latex allergy status: Secondary | ICD-10-CM | POA: Insufficient documentation

## 2015-08-21 DIAGNOSIS — I10 Essential (primary) hypertension: Secondary | ICD-10-CM | POA: Insufficient documentation

## 2015-08-21 HISTORY — DX: Transient cerebral ischemic attack, unspecified: G45.9

## 2015-08-21 HISTORY — DX: Polyneuropathy, unspecified: G62.9

## 2015-08-21 LAB — CBC AND DIFFERENTIAL
Basophils Absolute Automated: 0.02 10*3/uL (ref 0.00–0.20)
Basophils Automated: 0 %
Eosinophils Absolute Automated: 0.01 10*3/uL (ref 0.00–0.70)
Eosinophils Automated: 0 %
Hematocrit: 40 % (ref 37.0–47.0)
Hgb: 12.7 g/dL (ref 12.0–16.0)
Lymphocytes Absolute Automated: 2.07 10*3/uL (ref 0.50–4.40)
Lymphocytes Automated: 26 %
MCH: 27.2 pg — ABNORMAL LOW (ref 28.0–32.0)
MCHC: 31.8 g/dL — ABNORMAL LOW (ref 32.0–36.0)
MCV: 85.7 fL (ref 80.0–100.0)
MPV: 14.1 fL — ABNORMAL HIGH (ref 9.4–12.3)
Monocytes Absolute Automated: 0.41 10*3/uL (ref 0.00–1.20)
Monocytes: 5 %
Neutrophils Absolute: 5.45 10*3/uL (ref 1.80–8.10)
Neutrophils: 68 %
Platelets: 150 10*3/uL (ref 140–400)
RBC: 4.67 10*6/uL (ref 4.20–5.40)
RDW: 14 % (ref 12–15)
WBC: 7.96 10*3/uL (ref 3.50–10.80)

## 2015-08-21 LAB — COMPREHENSIVE METABOLIC PANEL
ALT: 18 U/L (ref 0–55)
AST (SGOT): 20 U/L (ref 5–34)
Albumin/Globulin Ratio: 1.2 (ref 0.9–2.2)
Albumin: 3.9 g/dL (ref 3.5–5.0)
Alkaline Phosphatase: 96 U/L (ref 37–106)
Anion Gap: 10 (ref 5.0–15.0)
BUN: 21 mg/dL — ABNORMAL HIGH (ref 7.0–19.0)
Bilirubin, Total: 0.4 mg/dL (ref 0.2–1.2)
CO2: 29 mEq/L (ref 22–29)
Calcium: 9.7 mg/dL (ref 8.5–10.5)
Chloride: 97 mEq/L — ABNORMAL LOW (ref 100–111)
Creatinine: 1 mg/dL (ref 0.6–1.0)
Globulin: 3.3 g/dL (ref 2.0–3.6)
Glucose: 224 mg/dL — ABNORMAL HIGH (ref 70–100)
Potassium: 3.7 mEq/L (ref 3.5–5.1)
Protein, Total: 7.2 g/dL (ref 6.0–8.3)
Sodium: 136 mEq/L (ref 136–145)

## 2015-08-21 LAB — ECG 12-LEAD
Atrial Rate: 78 {beats}/min
P Axis: 49 degrees
P-R Interval: 148 ms
Q-T Interval: 418 ms
QRS Duration: 78 ms
QTC Calculation (Bezet): 476 ms
R Axis: 57 degrees
T Axis: 15 degrees
Ventricular Rate: 78 {beats}/min

## 2015-08-21 LAB — GFR: EGFR: 56.2

## 2015-08-21 LAB — CELL MORPHOLOGY
Cell Morphology: NEGATIVE
Platelet Estimate: NEGATIVE

## 2015-08-21 LAB — IHS D-DIMER: D-Dimer: 0.42 ug/mL FEU (ref 0.00–0.51)

## 2015-08-21 LAB — TROPONIN I: Troponin I: <0.01 ng/mL (ref 0.00–0.09)

## 2015-08-21 MED ORDER — SODIUM CHLORIDE 0.9 % IV BOLUS
1000.0000 mL | Freq: Once | INTRAVENOUS | Status: AC
Start: 2015-08-21 — End: 2015-08-21
  Administered 2015-08-21: 1000 mL via INTRAVENOUS

## 2015-08-21 NOTE — Discharge Instructions (Signed)
Lightheadedness    You have been seen for lightheadedness.    Lightheadedness is a feeling that you are about to faint or "pass out." You may feel dizzy, but you should not feel as though the room is spinning. If you experience spinning, this may be a sign of another condition.    Lightheadedness can lead to a feeling of almost fainting or an actual fainting spell. You may sometimes feel nauseated or vomit when you are lightheaded. Lying down usually makes lightheadedness go away.    Lightheadedness is often caused by a slight drop in blood pressure. This causes a decrease in the amount of blood that gets to the head. This can happens when you get up too quickly from sitting or lying down.    Some commons reasons to feel lightheaded include:   Dehydration (not getting enough fluid). This can happen if you are not drinking enough fluid, especially in hot weather when you are sweating a lot.   Missing a meal. Some people become lightheaded when they skip a meal or two during the day.   Illness. Some people feel lightheaded when they are sick, especially when they have fever (temperature higher than 100.4F / 38C).   Blood loss. If you are losing blood you can become lightheaded. This can happen with heavy menstrual bleeding, bleeding from your stomach or intestines (sometimes this causes dark or black stools (poop)). If you lose blood slowly over time, you may not notice until you get symptoms like feeling lightheaded.   Sometimes we just can't figure out why someone feels lightheaded.    When you feel lightheaded, lie down for 1 or 2 minutes. This will allow more blood to flow to your brain. After lying down, sit up slowly and remain sitting for 1 to 2 minutes before slowly standing up.     The doctor caring for you today does not think the lightheadedness is caused by anything dangerous. It is OK for you to go home.    YOU SHOULD SEEK MEDICAL ATTENTION IMMEDIATELY, EITHER HERE OR AT THE NEAREST  EMERGENCY DEPARTMENT, IF ANY OF THE FOLLOWING OCCUR:   You pass out.   You feel confused.   You feel short of breath.   You have chest pain.

## 2015-08-21 NOTE — ED Provider Notes (Signed)
Physician/Midlevel provider first contact with patient: 08/21/15 1025         History     Chief Complaint   Patient presents with   . Dizziness     HPI Comments: 64 yo F w/ hx hemorrhagic stroke BIBA after feeling lightheaded when tried to get off the couch x2 this am.  Denies any spinning sensation, HA, vision changes, n/v, change in appetite, cp, palpitations.    The history is provided by the patient.            Past Medical History   Diagnosis Date   . Hypertensive disorder    . Diabetes    . Stroke    . Hyperlipemia    . TIA (transient ischemic attack)    . Neuropathy        Past Surgical History   Procedure Laterality Date   . Hysterectomy     . Tonsillectomy     . Reduction mammaplasty     . Urinary bladder         No family history on file.    Social  Social History   Substance Use Topics   . Smoking status: Never Smoker    . Smokeless tobacco: None   . Alcohol Use: No       .     Allergies   Allergen Reactions   . Codeine    . Glucosamine    . Latex        Home Medications             ezetimibe (ZETIA) 10 MG tablet     Take 10 mg by mouth daily.     glipiZIDE (GLUCOTROL) 10 MG tablet     Take 20 mg by mouth 2 (two) times daily before meals.     hydrochlorothiazide (HYDRODIURIL) 25 MG tablet     Take 25 mg by mouth daily.     lisinopril (PRINIVIL,ZESTRIL) 40 MG tablet     Take 40 mg by mouth 2 (two) times daily.           Review of Systems   Constitutional: Negative for fever, diaphoresis and fatigue.   Eyes: Negative for visual disturbance.   Respiratory: Negative for shortness of breath.    Cardiovascular: Negative for chest pain and palpitations.   Gastrointestinal: Positive for nausea. Negative for vomiting and abdominal pain.   Musculoskeletal: Negative for back pain.   Neurological: Positive for syncope (questionable because not witnessed ) and light-headedness. Negative for seizures, speech difficulty and headaches.   Psychiatric/Behavioral: Negative for confusion. The patient is nervous/anxious.     All other systems reviewed and are negative.      Physical Exam    BP: 150/79 mmHg, Heart Rate: 79, Temp: 98.3 F (36.8 C), Resp Rate: 18, SpO2: 97 %    Physical Exam   Constitutional: She is oriented to person, place, and time. Vital signs are normal. She appears well-developed and well-nourished. No distress.   HENT:   Head: Normocephalic and atraumatic.   Mouth/Throat: Oropharynx is clear and moist.   Eyes: Conjunctivae and EOM are normal. Pupils are equal, round, and reactive to light.   Neck: Normal range of motion. Neck supple. No JVD present.   Cardiovascular: Normal rate, regular rhythm, normal heart sounds and intact distal pulses.    No murmur heard.  Pulmonary/Chest: Effort normal and breath sounds normal. No respiratory distress. She has no rales. She exhibits no tenderness.   Abdominal: Soft. Bowel sounds  are normal. There is no tenderness. There is no rebound and no guarding.   Musculoskeletal: Normal range of motion. She exhibits no edema or tenderness.   Neurological: She is alert and oriented to person, place, and time. No cranial nerve deficit. She exhibits normal muscle tone. Coordination normal. GCS eye subscore is 4. GCS verbal subscore is 5. GCS motor subscore is 6.   Skin: Skin is warm and dry. No pallor.   Psychiatric: Her speech is normal and behavior is normal. Her mood appears anxious. Her affect is labile. She expresses impulsivity.   Nursing note and vitals reviewed.        MDM and ED Course     ED Medication Orders     Start Ordered     Status Ordering Provider    08/21/15 1037 08/21/15 1036  sodium chloride 0.9 % bolus 1,000 mL   Once     Route: Intravenous  Ordered Dose: 1,000 mL     Acknowledged Arnaldo Heffron A             MDM  Number of Diagnoses or Management Options  Lightheadedness:      Amount and/or Complexity of Data Reviewed  Clinical lab tests: ordered and reviewed  Tests in the radiology section of CPT: ordered and reviewed  Tests in the medicine section of CPT: ordered  and reviewed  Review and summarize past medical records: yes  Independent visualization of images, tracings, or specimens: yes    I, Kalman Drape MD, have been the primary provider for Emily Rowe during this Emergency Dept visit.    Oxygen saturation by pulse oximetry is 95%-100% on RA, Normal.  Interventions: Patient Observed.    EKG Interpretation: read by me at 1022  Rhythm:  Normal Sinus  Ectopy:  None  Rate:  Normal  Conduction:  No blocks  ST Segments:  Normal ST segments  T Waves:  Normal T Waves  Axis:  Normal  Q Waves:  None seen  Clinical Impression:  Normal EKG    Pt w/ lightheadedness with position changes only.  Not c/w stroke.  No significant lab abnormalities.  Will discharge and have pt f/u w/ PCP.      Results for orders placed or performed during the hospital encounter of 08/21/15   Comprehensive Metabolic Panel (CMP)   Result Value Ref Range    Glucose 224 (H) 70 - 100 mg/dL    BUN 78.2 (H) 7.0 - 95.6 mg/dL    Creatinine 1.0 0.6 - 1.0 mg/dL    Sodium 213 086 - 578 mEq/L    Potassium 3.7 3.5 - 5.1 mEq/L    Chloride 97 (L) 100 - 111 mEq/L    CO2 29 22 - 29 mEq/L    Calcium 9.7 8.5 - 10.5 mg/dL    Protein, Total 7.2 6.0 - 8.3 g/dL    Albumin 3.9 3.5 - 5.0 g/dL    AST (SGOT) 20 5 - 34 U/L    ALT 18 0 - 55 U/L    Alkaline Phosphatase 96 37 - 106 U/L    Bilirubin, Total 0.4 0.2 - 1.2 mg/dL    Globulin 3.3 2.0 - 3.6 g/dL    Albumin/Globulin Ratio 1.2 0.9 - 2.2    Anion Gap 10.0 5.0 - 15.0   CBC with differential   Result Value Ref Range    WBC 7.96 3.50 - 10.80 x10 3/uL    Hgb 12.7 12.0 - 16.0 g/dL    Hematocrit  40.0 37.0 - 47.0 %    Platelets 150 140 - 400 x10 3/uL    RBC 4.67 4.20 - 5.40 x10 6/uL    MCV 85.7 80.0 - 100.0 fL    MCH 27.2 (L) 28.0 - 32.0 pg    MCHC 31.8 (L) 32.0 - 36.0 g/dL    RDW 14 12 - 15 %    MPV 14.1 (H) 9.4 - 12.3 fL    Neutrophils 68 None %    Lymphocytes Automated 26 None %    Monocytes 5 None %    Eosinophils Automated 0 None %    Basophils Automated 0 None %     Neutrophils Absolute 5.45 1.80 - 8.10 x10 3/uL    Abs Lymph Automated 2.07 0.50 - 4.40 x10 3/uL    Abs Mono Automated 0.41 0.00 - 1.20 x10 3/uL    Abs Eos Automated 0.01 0.00 - 0.70 x10 3/uL    Absolute Baso Automated 0.02 0.00 - 0.20 x10 3/uL   GFR   Result Value Ref Range    EGFR 56.2    D-Dimer   Result Value Ref Range    D-Dimer 0.42 0.00 - 0.51 ug/mL FEU   Troponin I   Result Value Ref Range    Troponin I <0.01 0.00 - 0.09 ng/mL   Cell MorpHology   Result Value Ref Range    Cell Morphology: N     Platelet Estimate N      Results for orders placed or performed during the hospital encounter of 08/21/15   CT Head without Contrast    Narrative    HISTORY: Syncope and dizziness. Stroke protocol.    COMPARISON: 11/22/2012    TECHNIQUE: Noncontrast CT imaging of the head was performed.    The following dose reduction techniques were utilized: automated  exposure control and/or adjustment of the mA and/or kV according to  patient size, and the use of iterative reconstruction technique.    FINDINGS: There is no hemorrhage, edema, mass-effect, midline shift or  hydrocephalus. There is atherosclerosis. The basilar cisterns are clear.  There has been prior sinonasal surgery.      Impression    1. No acute intracranial abnormality is detected. Stable exam.    These urgent results were discussed with and acknowledged by the ER  physician caring for this patient, at about 1055 hours on 08/20/2015.      Otho Ket, MD   08/21/2015 11:04 AM     Chest AP Portable    Narrative    Clinical history: Syncope.    COMPARISON: 11/22/2012.    Chest, AP portable: The thoracic aorta is slightly tortuous but stable.  The cardiac and hilar silhouettes remain within normal limits. The lungs  remain grossly clear.      Impression     Stable study. No acute process.    Al Decant, MD   08/21/2015 11:26 AM             Procedures    Clinical Impression & Disposition     Clinical Impression  Final diagnoses:   Lightheadedness        ED  Disposition     Discharge Emily Rowe discharge to home/self care.    Condition at disposition: Stable             Discharge Medication List as of 08/21/2015 12:00 PM  Kalman Drape, MD  08/24/15 Moses Manners

## 2015-08-21 NOTE — ED Notes (Signed)
Patient complaining of dizziness- hx of dizziness all the time- worse over the last 1 hour-states had a syncopal episode-  dizzinessworse when she bends over

## 2015-08-21 NOTE — ED Notes (Signed)
[facility]: The [place] Bedside Swallowing Screening Test      Time of Evaluation: __1128________      A) Before water intake:      1.  Have patient say "ah" and judge voice quality    Abnormal or Normal: ____normal____________  2.  Ask patient to stick tongue out and then move it from side to side.    Abnormal or Normal: ____normal____________    B)  Water intake:    Have the patient sit upright and give water.  Ask patient to say "ah" after each intake.  Mark as abnormal if you note any of the following signs: coughing, change in voice quality or drooling.  If abnormal, stop water intake and advance to "D."    1.  Give one teaspoon of water at a time.  Stop immediately for any abnormal signs as listed above.  If swallowing normally, repeat for a total of 10 teaspoons.    Abnormalities noted:  __none______    If yes, which swallow? n/a    If yes, what was the abnormality? _______n/a_______________      If no, continue the test.    C) Cup Drinking: If all 10 teaspoons were swallowed normally.  Allow the patient to drink from the cup.  Wait 1 minute after the end of the water swallows.  Ask patient to say "ah" and judge voice quality.    Abnormal or Normal: ______normal__________    D)  Results:  No abnormal signs (pass) or 1 or more abnormal signs (fail) ____pass_____

## 2015-09-09 ENCOUNTER — Ambulatory Visit (INDEPENDENT_AMBULATORY_CARE_PROVIDER_SITE_OTHER): Payer: Self-pay | Admitting: Cardiovascular Disease

## 2015-09-16 ENCOUNTER — Other Ambulatory Visit: Payer: Self-pay | Admitting: Cardiovascular Disease

## 2015-09-16 DIAGNOSIS — R55 Syncope and collapse: Secondary | ICD-10-CM

## 2015-09-22 ENCOUNTER — Ambulatory Visit
Admission: RE | Admit: 2015-09-22 | Discharge: 2015-09-22 | Disposition: A | Discharge status: Home / Self Care | Payer: Medicare Other | Source: Ambulatory Visit | Attending: Cardiovascular Disease | Admitting: Cardiovascular Disease

## 2015-09-22 DIAGNOSIS — R55 Syncope and collapse: Secondary | ICD-10-CM

## 2015-09-29 ENCOUNTER — Other Ambulatory Visit (INDEPENDENT_AMBULATORY_CARE_PROVIDER_SITE_OTHER): Payer: Self-pay

## 2015-09-29 ENCOUNTER — Ambulatory Visit
Admission: RE | Admit: 2015-09-29 | Discharge: 2015-09-29 | Disposition: A | Discharge status: Home / Self Care | Payer: Medicare Other | Source: Ambulatory Visit | Attending: Cardiovascular Disease | Admitting: Cardiovascular Disease

## 2015-09-29 DIAGNOSIS — R55 Syncope and collapse: Secondary | ICD-10-CM | POA: Insufficient documentation

## 2015-09-29 DIAGNOSIS — I517 Cardiomegaly: Secondary | ICD-10-CM | POA: Insufficient documentation

## 2015-10-18 DIAGNOSIS — G4730 Sleep apnea, unspecified: Secondary | ICD-10-CM

## 2015-10-18 HISTORY — DX: Sleep apnea, unspecified: G47.30

## 2016-04-14 ENCOUNTER — Emergency Department
Admission: EM | Admit: 2016-04-14 | Discharge: 2016-04-14 | Disposition: A | Discharge status: Home / Self Care | Payer: Medicare Other | Attending: Emergency Medicine | Admitting: Emergency Medicine

## 2016-04-14 ENCOUNTER — Emergency Department: Payer: Medicare Other

## 2016-04-14 DIAGNOSIS — E785 Hyperlipidemia, unspecified: Secondary | ICD-10-CM | POA: Insufficient documentation

## 2016-04-14 DIAGNOSIS — Z7984 Long term (current) use of oral hypoglycemic drugs: Secondary | ICD-10-CM | POA: Insufficient documentation

## 2016-04-14 DIAGNOSIS — E1165 Type 2 diabetes mellitus with hyperglycemia: Secondary | ICD-10-CM | POA: Insufficient documentation

## 2016-04-14 DIAGNOSIS — Z8673 Personal history of transient ischemic attack (TIA), and cerebral infarction without residual deficits: Secondary | ICD-10-CM | POA: Insufficient documentation

## 2016-04-14 DIAGNOSIS — R739 Hyperglycemia, unspecified: Secondary | ICD-10-CM

## 2016-04-14 DIAGNOSIS — I10 Essential (primary) hypertension: Secondary | ICD-10-CM | POA: Insufficient documentation

## 2016-04-14 LAB — COMPREHENSIVE METABOLIC PANEL
ALT: 25 U/L (ref 0–55)
AST (SGOT): 26 U/L (ref 5–34)
Albumin/Globulin Ratio: 1.2 (ref 0.9–2.2)
Albumin: 3.9 g/dL (ref 3.5–5.0)
Alkaline Phosphatase: 103 U/L (ref 37–106)
Anion Gap: 9 (ref 5.0–15.0)
BUN: 16.9 mg/dL (ref 7.0–19.0)
Bilirubin, Total: 0.4 mg/dL (ref 0.2–1.2)
CO2: 28 mEq/L (ref 22–29)
Calcium: 9.4 mg/dL (ref 8.5–10.5)
Chloride: 101 mEq/L (ref 100–111)
Creatinine: 0.9 mg/dL (ref 0.6–1.0)
Globulin: 3.3 g/dL (ref 2.0–3.6)
Glucose: 298 mg/dL — ABNORMAL HIGH (ref 70–100)
Potassium: 4.2 mEq/L (ref 3.5–5.1)
Protein, Total: 7.2 g/dL (ref 6.0–8.3)
Sodium: 138 mEq/L (ref 136–145)

## 2016-04-14 LAB — GFR: EGFR: >60

## 2016-04-14 LAB — CBC AND DIFFERENTIAL
Basophils Absolute Automated: 0.02 10*3/uL (ref 0.00–0.20)
Basophils Automated: 0.4 %
Eosinophils Absolute Automated: 0.02 10*3/uL (ref 0.00–0.70)
Eosinophils Automated: 0.4 %
Hematocrit: 41.5 % (ref 37.0–47.0)
Hgb: 13.4 g/dL (ref 12.0–16.0)
Immature Granulocytes Absolute: 0.02 10*3/uL
Immature Granulocytes: 0.4 %
Lymphocytes Absolute Automated: 1.79 10*3/uL (ref 0.50–4.40)
Lymphocytes Automated: 31.8 %
MCH: 27.9 pg — ABNORMAL LOW (ref 28.0–32.0)
MCHC: 32.3 g/dL (ref 32.0–36.0)
MCV: 86.5 fL (ref 80.0–100.0)
MPV: 13.9 fL — ABNORMAL HIGH (ref 9.4–12.3)
Monocytes Absolute Automated: 0.45 10*3/uL (ref 0.00–1.20)
Monocytes: 8 %
Neutrophils Absolute: 3.35 10*3/uL (ref 1.80–8.10)
Neutrophils: 59.4 %
Platelets: 134 10*3/uL — ABNORMAL LOW (ref 140–400)
RBC: 4.8 10*6/uL (ref 4.20–5.40)
RDW: 14 % (ref 12–15)
WBC: 5.63 10*3/uL (ref 3.50–10.80)

## 2016-04-14 LAB — URINALYSIS, REFLEX TO MICROSCOPIC EXAM IF INDICATED
Bilirubin, UA: NEGATIVE
Blood, UA: NEGATIVE
Glucose, UA: >=500 — AB
Ketones UA: 5 — AB
Leukocyte Esterase, UA: NEGATIVE
Nitrite, UA: NEGATIVE
Protein, UR: NEGATIVE
Specific Gravity UA: 1.015 (ref 1.001–1.035)
Urine pH: 6 (ref 5.0–8.0)
Urobilinogen, UA: NORMAL mg/dL

## 2016-04-14 LAB — PHOSPHORUS: Phosphorus: 2.7 mg/dL (ref 2.3–4.7)

## 2016-04-14 LAB — GLUCOSE WHOLE BLOOD - POCT: Whole Blood Glucose POCT: 301 mg/dL — ABNORMAL HIGH (ref 70–100)

## 2016-04-14 LAB — MAGNESIUM: Magnesium: 1.7 mg/dL (ref 1.6–2.6)

## 2016-04-14 LAB — TROPONIN I: Troponin I: 0.01 ng/mL (ref 0.00–0.09)

## 2016-04-14 MED ORDER — SODIUM CHLORIDE 0.9 % IV BOLUS
1000.0000 mL | Freq: Once | INTRAVENOUS | Status: AC
Start: 2016-04-14 — End: 2016-04-14
  Administered 2016-04-14: 1000 mL via INTRAVENOUS

## 2016-04-14 MED ORDER — ACETAMINOPHEN 500 MG PO TABS
1000.0000 mg | ORAL_TABLET | Freq: Once | ORAL | Status: AC
Start: 2016-04-14 — End: 2016-04-14
  Administered 2016-04-14: 1000 mg via ORAL
  Filled 2016-04-14: qty 2

## 2016-04-14 NOTE — ED Provider Notes (Signed)
Physician/Midlevel provider first contact with patient: 04/14/16 1106         History     Chief Complaint   Patient presents with   . Hyperglycemia     HPI Comments: 65 year old female presents ER complaining of elevated blood sugar.  Patient reports history of diabetes with intermittent elevated blood sugars followed up with her primary care doctor today and her blood sugar was 325 in the office she was sent to the ER for further evaluation.  Patient reports intermittent dizziness since yesterday but is not currently present.  Patient currently denies any pain or radiation.  Patient has been taking medicines as prescribed.  No recent illness.  No nausea or vomiting.  No fever or chills.    Patient is a 65 y.o. female presenting with hyperglycemia.   Hyperglycemia  Blood sugar level PTA:  325  Severity:  Moderate  Onset quality:  Gradual  Timing:  Intermittent  Progression:  Unchanged  Chronicity:  Recurrent  Diabetes status:  Controlled with oral medications  Current diabetic therapy:  Glipizide  Relieved by:  None tried  Ineffective treatments:  None tried  Associated symptoms: dizziness    Associated symptoms: no abdominal pain, no blurred vision, no chest pain, no confusion, no dysuria, no fever, no increased appetite, no increased thirst, no nausea, no polyuria, no shortness of breath, no vomiting and no weakness    Dizziness:     Severity:  Mild    Timing:  Intermittent    Progression:  Unchanged  Risk factors: obesity             Past Medical History   Diagnosis Date   . Hypertensive disorder    . Diabetes    . Stroke    . Hyperlipemia    . TIA (transient ischemic attack)    . Neuropathy        Past Surgical History   Procedure Laterality Date   . Hysterectomy     . Tonsillectomy     . Reduction mammaplasty     . Urinary bladder         History reviewed. No pertinent family history.    Social-lives with family   Social History   Substance Use Topics   . Smoking status: Never Smoker    . Smokeless tobacco:  None   . Alcohol Use: No       .     Allergies   Allergen Reactions   . Codeine    . Glucosamine    . Latex        Home Medications     Last Medication Reconciliation Action:  In Progress Mueller, Scott A, RN 04/14/2016 11:18 AM                        Flagged for Removal             ezetimibe (ZETIA) 10 MG tablet     Take 10 mg by mouth daily.     glipiZIDE (GLUCOTROL) 10 MG tablet     Take 20 mg by mouth 2 (two) times daily before meals.     hydrochlorothiazide (HYDRODIURIL) 25 MG tablet     Take 25 mg by mouth daily.     lisinopril (PRINIVIL,ZESTRIL) 40 MG tablet     Take 40 mg by mouth 2 (two) times daily.           Review of Systems   Constitutional: Negative for fever  and chills.   HENT: Negative for congestion and rhinorrhea.    Eyes: Negative for blurred vision, discharge and redness.   Respiratory: Negative for cough and shortness of breath.    Cardiovascular: Negative for chest pain and palpitations.   Gastrointestinal: Negative for nausea, vomiting and abdominal pain.   Endocrine: Negative for polydipsia, polyphagia and polyuria.   Genitourinary: Negative for dysuria, urgency, frequency, hematuria and flank pain.   Musculoskeletal: Negative for back pain, gait problem, neck pain and neck stiffness.   Skin: Negative for color change, pallor and wound.   Neurological: Positive for dizziness. Negative for syncope, weakness, numbness and headaches.   Hematological: Does not bruise/bleed easily.   Psychiatric/Behavioral: Negative for confusion and self-injury. The patient is not nervous/anxious.        Physical Exam    BP: 180/86 mmHg, Heart Rate: 69, Temp: 97.7 F (36.5 C), Resp Rate: 16, SpO2: 96 %, Weight: 86.183 kg    Physical Exam   Constitutional: She is oriented to person, place, and time. She appears well-developed and well-nourished. No distress.   Pt resting comfortably in NAD   HENT:   Head: Normocephalic and atraumatic.   Right Ear: External ear normal.   Left Ear: External ear normal.   Nose: Nose  normal.   Mouth/Throat: Oropharynx is clear and moist. No oropharyngeal exudate.   Eyes: Conjunctivae are normal. Pupils are equal, round, and reactive to light. Right eye exhibits no discharge. Left eye exhibits no discharge. No scleral icterus.   Neck: Normal range of motion. Neck supple. No JVD present. No tracheal deviation present.   Cardiovascular: Normal rate and regular rhythm.    Pulmonary/Chest: Effort normal and breath sounds normal. No respiratory distress. She has no wheezes. She has no rales. She exhibits no tenderness.   Abdominal: Soft. Bowel sounds are normal. She exhibits no distension and no mass. There is no tenderness. There is no rebound and no guarding.   Musculoskeletal: Normal range of motion. She exhibits no edema or tenderness.   Neurological: She is alert and oriented to person, place, and time.   Skin: Skin is warm and dry. No rash noted. She is not diaphoretic.   Psychiatric: She has a normal mood and affect. Her behavior is normal. Judgment and thought content normal.   Nursing note and vitals reviewed.        MDM and ED Course     ED Medication Orders     Start Ordered     Status Ordering Provider    04/14/16 1139 04/14/16 1138  acetaminophen (TYLENOL) tablet 1,000 mg   Once     Route: Oral  Ordered Dose: 1,000 mg     Last MAR action:  Given Larina Bras    04/14/16 1111 04/14/16 1110  sodium chloride 0.9 % bolus 1,000 mL   Once     Route: Intravenous  Ordered Dose: 1,000 mL     Last MAR action:  Stopped Larina Bras             MDM  Number of Diagnoses or Management Options  Hyperglycemia:   Diagnosis management comments: I, Langley Gauss, PA-C, have been the primary provider for Hoyle Barr during this Emergency Dept visit. The attending signature signifies review and agreement of the history, physical exam, evaluation, clinical impression and plan except as noted.   I have reviewed the nursing notes, including Past medical and surgical,Family  and Social History     Oxygen Saturation by  Pulse Oximetry  is 95%-100% - normal, no interventions needed     Differential diagnosis hyperglycemia, elevated blood sugar, DKA, dehydration, electrolyte imbalance    Labs reviewed by me.  Complete blood count with platelets decreased to 134, consistent with thrombocytopenia, complete metabolic panel with glucose mildly elevated to 298    Multiple reassessments of the patient. Pt resting comfortably in NAD.  Discussed with patient need for rest, avoid aggravating activities and follow-up. Return to the ER for any concerns. Pt voices understanding. No questions.          Amount and/or Complexity of Data Reviewed  Clinical lab tests: ordered and reviewed  Discuss the patient with other providers: yes  Independent visualization of images, tracings, or specimens: yes    Risk of Complications, Morbidity, and/or Mortality  Presenting problems: moderate  Management options: moderate    Patient Progress  Patient progress: stable            Procedures  ,  Results     Procedure Component Value Units Date/Time    Troponin I [[id number]] Collected:  04/14/16 1241    Specimen Information:  Blood Updated:  04/14/16 1316     Troponin I 0.01 ng/mL     Glucose Whole Blood - POCT [[id number]]  (Abnormal) Collected:  04/14/16 1241     POCT - Glucose Whole blood 301 (H) mg/dL Updated:  78/29/56 2130    Comprehensive metabolic panel [[id number]]  (Abnormal) Collected:  04/14/16 1241    Specimen Information:  Blood Updated:  04/14/16 1308     Glucose 298 (H) mg/dL      BUN 29.5 mg/dL      Creatinine 0.9 mg/dL      Sodium 284 mEq/L      Potassium 4.2 mEq/L      Chloride 101 mEq/L      CO2 28 mEq/L      Calcium 9.4 mg/dL      Protein, Total 7.2 g/dL      Albumin 3.9 g/dL      AST (SGOT) 26 U/L      ALT 25 U/L      Alkaline Phosphatase 103 U/L      Bilirubin, Total 0.4 mg/dL      Globulin 3.3 g/dL      Albumin/Globulin Ratio 1.2      Anion Gap 9.0     Magnesium [[id number]] Collected:  04/14/16 1241     Specimen Information:  Blood Updated:  04/14/16 1308     Magnesium 1.7 mg/dL     Phosphorus [[id number]] Collected:  04/14/16 1241    Specimen Information:  Blood Updated:  04/14/16 1308     Phosphorus 2.7 mg/dL     GFR [[id number]] Collected:  04/14/16 1241     EGFR >60.0 Updated:  04/14/16 1308    CBC with differential [[id number]]  (Abnormal) Collected:  04/14/16 1241    Specimen Information:  Blood from Blood Updated:  04/14/16 1257     WBC 5.63 x10 3/uL      Hgb 13.4 g/dL      Hematocrit 95.1 %      Platelets 134 (L) x10 3/uL      RBC 4.80 x10 6/uL      MCV 86.5 fL      MCH 27.9 (L) pg      MCHC 32.3 g/dL      RDW 14 %      MPV 13.9 (H) fL      Neutrophils 59.4 %  Lymphocytes Automated 31.8 %      Monocytes 8.0 %      Eosinophils Automated 0.4 %      Basophils Automated 0.4 %      Immature Granulocyte 0.4 %      Neutrophils Absolute 3.35 x10 3/uL      Abs Lymph Automated 1.79 x10 3/uL      Abs Mono Automated 0.45 x10 3/uL      Abs Eos Automated 0.02 x10 3/uL      Absolute Baso Automated 0.02 x10 3/uL      Absolute Immature Granulocyte 0.02 x10 3/uL     UA with reflex to micro (pts  3 + yrs) [[id number]]  (Abnormal) Collected:  04/14/16 1241    Specimen Information:  Urine Updated:  04/14/16 1255     Urine Type Clean Catch      Color, UA Yellow      Clarity, UA Clear      Specific Gravity UA 1.015      Urine pH 6.0      Leukocyte Esterase, UA Negative      Nitrite, UA Negative      Protein, UR Negative      Glucose, UA >=500 (A)      Ketones UA 5 (A)      Urobilinogen, UA Normal mg/dL      Bilirubin, UA Negative      Blood, UA Negative             Clinical Impression & Disposition     Clinical Impression  Final diagnoses:   Hyperglycemia        ED Disposition     Discharge Hoyle Barr discharge to home/self care.    Condition at disposition: Stable             Discharge Medication List as of 04/14/2016  1:59 PM                      Concaugh-Gruendel, Faylene Kurtz, PA  04/14/16 1920    Larina Bras, MD  04/15/16 [id number]

## 2016-04-14 NOTE — Discharge Instructions (Signed)
Please rest and avoid aggravating activities.  Please follow-up.  Return to the ER for any concerns.    Blood Sugar Monitoring (Edu)    Why you were in the hospital.    Blood sugar testing is an important tool for improving your health. You have been diagnosed with a condition, or started on a medicine that makes your blood sugar run too high or too low.    There are several conditions that can cause abnormal blood sugar levels. These include diabetes, hypoglycemia (low sugar), liver disease, thyroid disease or other metabolic problems.     Certain medicines can also cause abnormal blood sugar levels. These include corticosteroids (prednisone), antipsychotic medicines and diabetes medicines.     Your doctor will tell you how often you should check your blood sugar. He or she will also tell you what your target blood sugar levels should be. The target level will be based on your age, your general health and any other medical problems you may have.    For most people, a general range of target blood sugars is as follows:   Before a meal: 70-130 mg/dL (4-7 mmol/L).   1 to 2 hours after a meal: less than 180 mg/dL (10 mmol/L).    Fasting more than 8 hours: 90-130 mg/dL (5-7 mmol/L).    Talk to your health care provider about what to do when your blood sugar levels are not in the normal range for your target goals.    How to test your blood sugar:   Wash your hands with soap and water.   Remove a test strip from the container. Put the cap back on the bottle.   Insert the test strip into the meter.   Prick your finger with the needle (lancet) provided with your test kit. Prick the side of your finger, rather than the tip. This causes less pain.   Gently squeeze or massage your finger until a drop of blood forms.   Touch the test strip to the blood. Avoid touching your skin.   The meter will show your blood sugar level on the screen.   Write down the level and bring a record of these measurements to  appointments with your health care provider.   Note: You may experience pain or bruising where you test. You may have minor bleeding at your testing site. Hold firm pressure for 5 minutes over the site if you keep bleeding from a testing site.    Follow the instructions in the user manual for your meter. Meters vary based on brand and style.   Change batteries in your meter as directed in the instruction manual.   Use test strips designed for your meter. Not all strips and meters will work together.   Store test strips as instructed.   Do not use expired test strips.   Clean the meter regularly.   Run quality control tests as directed in the user manual.   Check the manual if you have any other questions or problems with how the meter works.   Bring the meter with you to your appointments so you can ask questions.    What you should do.    You may be scheduled for a follow-up appointment with your health care provider or a specialist. This is to ensure he or she can properly follow up and discuss your plan of care. Call your doctor as soon as possible if you can t keep your appointment.    Signs of  low blood sugar include:   Shakiness and general weakness.   Sweating.   Dizziness.   Headache.   Blurred vision.   Irritability.    Slurred (unclear) speech.   Drowsiness (feeling sleepy).   Confusion.   Fainting and/or seizures.    Heart palpitations (feeling your heart is beating abnormally).   Hunger.    If any of the above happens, do the following (if you can):   Test your blood sugar right away.    If your blood sugar is less than 70, eat a snack with some sugar. This can be a glass of orange juice or a piece of candy.    Then, eat a snack with some protein to help stabilize your blood sugar.   Recheck your sugar 15 minutes after your snack or sooner if your symptoms are not going away.     Call [facility] [phone]) if you feel confused, faint (weak), have slurred speech  or keep having low blood sugars.     Signs of low blood sugar include:   Urinating (peeing) often.   Greater thirst.   Dry mouth.   Blurred vision.   Fatigue (tiredness).   Nausea.     If any of the above happens, do the following:   Test your blood sugar if you are having these symptoms.    If your level is higher than your target range, this is called hyperglycemia. Very high blood sugar can also be very dangerous.    Talk with your health care provider about what to do when your blood sugars are above your target goals.    Call [facility] [phone]) if you feel confused, faint (weak) or you can t do the things necessary to take of your high blood sugar.    When you should contact your doctor.   If your blood sugar is below or above the targets discussed with your health care provider.   If you are having low blood sugars.   If you have loss of appetite, nausea (sick to stomach), vomiting or abdominal pain.   If you have sweet, fruity smelling breath (a sign of serious problems with high blood sugar).   If you think you have taken too much insulin or other diabetes medicine.   If you have signs of an infection.   If another doctor tells you to make changes in your medicines, vitamins, or supplements.   If you have been told you have kidney disease.   If you get sudden severe chest pain or have trouble breathing.   If you get a fever (temperature higher than 100.54F or 38C) or have chills.    When you should seek care at an Emergency Department.    YOU SHOULD SEEK MEDICAL ATTENTION IMMEDIATELY AT THE NEAREST EMERGENCY DEPARTMENT, IF ANY OF THE FOLLOWING HAPPENS:     You have major bleeding or bleeding that worries you.   You have any new or sudden warmth, redness, or swelling in the finger you use for testing.   You have trouble breathing or sudden shortness of breath.   You have chest pain, discomfort or tightness.   You have weakness, numbness or tingling in your arms or  legs.   You have any of the above symptoms and can t reach your primary care doctor.              Diabetes Mellitus, Type II    You have been diagnosed with type II diabetes.  Diabetes is a disease. It is when the body has trouble regulating the amount of sugar in the blood stream.    Type II diabetes is sometimes called "adult-onset diabetes". It is usually found in adults older than 40. However, type II diabetes is also seen in children and young adults.    Diabetes is a very serious disease. When not controlled, it can cause life-threatening problems. Untreated diabetes can lead to heart problems and kidney problems (including kidney failure). It can also lead to stroke and blindness.    Your insulin or pills will help keep your sugar under control. Also follow the diet prescribed by your doctor to keep your sugar under control. Untreated diabetes can cause heart problems, stroke or blindness. Therefore, it is very important for you to follow up with your regular doctor.    Check your blood sugar before every meal and before bedtime.    Insulin and other medicines for diabetes can make you feel lightheaded. You may sweat or even pass out. Always keep a snack food that contains sugar with you. If you begin to have these symptoms, eat the snack food to see if the symptoms get better. NEVER drive a car when feeling this way.    YOU SHOULD SEEK MEDICAL ATTENTION IMMEDIATELY, EITHER HERE OR AT THE NEAREST EMERGENCY DEPARTMENT, IF ANY OF THE FOLLOWING OCCURS:   Abdominal (belly) pain.   More than 3 vomiting (throwing up) episodes.   Blood sugar over 400 more than 3 times.   Fever (temperature higher than 100.36F / 38C) or shaking chills.   Unable to keep medicines down.   Any trouble swallowing or breathing.   Infection or rash on your skin.   Low blood sugar (less than 70) 3 or more times that does not get better after eating.              Diabetic Diet (Edu)    You have been given information  about diet guidelines for diabetes control. Use these only as general dietary guidelines. You should also get formal diabetes instruction from a specialist.    Diabetes is when your body cannot make insulin or use it properly. Insulin is a hormone that controls your blood sugar. High blood sugars can be very serious. Over time, they can lead to other serious health problems. These include vision loss, sensation loss in the feet, heart disease, stroke, and kidney problems.     Eating healthy helps to lower your blood sugar. It is one of the most important steps in managing diabetes.     A diabetes expert can help you manage your diabetes. This could be your doctor or endocrinologist (a doctor who specializes in the body s hormone system). It could also be a registered dietician or nutritionist. They can give you flexible meal plans to go with your lifestyle and other health needs.     A healthy diabetic diet includes a balance of foods. Use the food pyramid for reference. You can keep your blood sugars steady by:   Limiting sweet foods and fats.   Being careful about how many carbohydrates you eat, and when you eat them.   Eating a lot of whole grain foods, fruits and vegetables.    You may be taking medicine for your diabetes. If so, it is very important to follow a schedule for your meals and snacks.    Here are some general guidelines:   Fats and sweets are not as nutritious or healthy as  other foods. Fats have a lot of calories. Sweets can be high in carbohydrates and fats. Some sweet foods with a lot of fat and calories are ice cream, pie, syrup, cookies, doughnuts and cake. Other examples of fats are salad dressing, oil, cream cheese, butter, mayonnaise, olives and bacon. You do not have to get rid of these foods completely. Instead, try sugar-free popsicles or cocoa mix, fat-free ice cream or frozen yogurt or diet soda.   Other carbohydrates are bread, grains, cereal, pasta, and some vegetables like  corn and potatoes. These foods give you vitamins, minerals and fiber. Whole grain starches are healthier. They have more of the vitamins, minerals and fiber your body needs. You should eat starches at every meal.   Meat and meat substitutes give your body the protein it needs. The meat and meat substitutes group includes chicken, beef, fish, pork, ham, lamb, [place], eggs, cheese and tofu. Eat small amounts of these foods every day. Buy cuts of meat that have just a small amount of fat. Trim off any extra fat. Eat chicken or [place] without the skin. Cook the meat in low-fat ways. This includes broiling, grilling, roasting and steaming. Try vinegar, lemon juice, soy sauce, salsa, ketchup, herbs and spices for extra flavor.    Measure your food. This helps control your meal portions or serving sizes. This will help you maintain a good body weight. It will also help with a steady blood sugar level. Use measuring cups or spoons to make sure your servings are the right size. You can also use a food scale. You can find all nutrition facts and serving sizes on food package labels. Talk to your nutritionist about portions of each of the food groups.     For more information, talk to your doctor, nurse or nutritionist. You can also contact your local [employer] Diabetes Association. www.diabetes.org

## 2016-04-20 ENCOUNTER — Encounter (INDEPENDENT_AMBULATORY_CARE_PROVIDER_SITE_OTHER): Payer: Self-pay | Admitting: Internal Medicine

## 2016-04-20 ENCOUNTER — Ambulatory Visit (INDEPENDENT_AMBULATORY_CARE_PROVIDER_SITE_OTHER): Payer: Medicare Other | Admitting: Internal Medicine

## 2016-04-20 VITALS — BP 125/84 | HR 96 | Resp 16 | Ht 65.0 in | Wt 191.0 lb

## 2016-04-20 DIAGNOSIS — E1165 Type 2 diabetes mellitus with hyperglycemia: Secondary | ICD-10-CM

## 2016-04-20 DIAGNOSIS — I10 Essential (primary) hypertension: Secondary | ICD-10-CM | POA: Insufficient documentation

## 2016-04-20 DIAGNOSIS — E782 Mixed hyperlipidemia: Secondary | ICD-10-CM

## 2016-04-20 DIAGNOSIS — Z794 Long term (current) use of insulin: Secondary | ICD-10-CM

## 2016-04-20 MED ORDER — GLIPIZIDE ER 10 MG PO TB24
ORAL_TABLET | ORAL | Status: DC
Start: 2016-04-20 — End: 2017-05-16

## 2016-04-20 NOTE — Patient Instructions (Signed)
1. Please check sugars x3/ day- fasting, prior to lunch and 2h post dinner.  2. Please increase lantus to 20 units at bedtime; if blood sugars are over 150, increase the dose to 25 units in 3 days.  3. Recommend glipizide XL 20 mg daily with breakfast.  4. Please report sugar readings to [place] in 1 week.  5. Please schedule appointment with dietitian Amy Laural Benes

## 2016-04-20 NOTE — Progress Notes (Signed)
Subjective:      Date: 04/20/2016 2:47 PM   Patient ID: Emily Rowe is a 65 y.o. female.    Chief Complaint:  Chief Complaint   Patient presents with   . Diabetes   . Referred by PCP Dr. Caryn Bee       HPI:  HPI     Visit type: initial eval - existing diagnosis  Type: Type II and Non-insulin requiring  Evaluation: diagnosed 5 years ago ; moved from [place] in 1978.  Last follow-up: a month ago and with the patient's primary care physician    Diabetes Medication Regimen:  Recently started Lantus 10 units in the AM 10 units in PM on 04/16/16. Reports that due to liver dysfunction, she has been unable to tolerate most of her oral hypoglycemic agents.  Medication effectiveness/adherence: not working  Medication side effects: none  Presenting symptoms at time of diagnosis: Stroke  Prior medications: Glipizide and metformin (has side effects of diarrhea with metformin)  Daily basal insulin dosing: Lantus 10/10 units  Daily bolus insulin dosing: non-insulin requiring  Current symptoms: tingling/numbness in extremities , blurred vision   Patient denies: no other symptoms including chest pain, shortness of breath, hypoglycemic episodes, polydipsia, polyuria, headaches, dizziness, or abdominal pain  Exercise: daily and walking  Diet: moderate compliance with and well-balanced diet; grows her vegetables in the garden   Complications: hyperglycemia  Pertinent medical history: neuropathy and stroke  Home glucose readings: Download [id number] Sugars range 300-400 mg/dl  D6U result: 44.0%  H4V trend is increasing  Microalbumin trend: No results found for: MICROALBUMIN  Microalbumin trend is variable  LDL trend:   Lab Results   Component Value Date    LDL 154* 11/18/2010     LDL trend is increasing  Additional concerns: In addition, pt has a history of hyperlipidemia and is compliant with lipid therapy.  Pt denies side effects of lipid therapy - specifically denies myalgia., In addition, pt has a history of stable  chronic, essential HTN with no evidence of CHF or proteinuria.  The patient is compliant with anti-hypertensive therapy and denies side effects to therapy.  Pt denies CP, SOB, dizziness, orthopnea, PND or edema.     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    Has a h/o CVA a few years ago; reports complete recovery of function following PT.  Problem List:  Patient Active Problem List   Diagnosis   . Uncontrolled type 2 diabetes mellitus with hyperglycemia, with long-term current use of insulin   . Mixed hyperlipidemia   . Benign hypertension   . Long-term insulin use       Current Medications:  Current Outpatient Prescriptions   Medication Sig Dispense Refill   . BD ULTRA-FINE PEN NEEDLES 29G X 12.7MM Misc TO USE WITH LANTUS SOLOSTAR PEN.  2   . glipiZIDE XL (GLUCOTROL XL) 5 MG 24 hr tablet TK 2 T PO IN THE MORNING  1   . insulin glargine (LANTUS SOLOSTAR) 100 UNIT/ML injection pen INJECT 4 UNITS ONCE A DAY AT BEDTIME AND WILL INCREASE PER PHYSICIAN RECOMMENDATION  0   . NON FORMULARY Levanlodipino besilato 2.5     . NON FORMULARY Diupress 2.5 mg daily       No current facility-administered medications for this visit.       Allergies:  Allergies   Allergen Reactions   . Codeine    . Glucosamine    .  Latex        Past Medical History:  Past Medical History   Diagnosis Date   . Hypertensive disorder    . Diabetes    . Stroke    . Hyperlipemia    . TIA (transient ischemic attack)    . Neuropathy        Past Surgical History:  Past Surgical History   Procedure Laterality Date   . Hysterectomy     . Tonsillectomy     . Reduction mammaplasty     . Urinary bladder         Family History:  Family History   Problem Relation Age of Onset   . Diabetes Mother    . Diabetes Maternal Grandmother        Social History:  Social History     Social History   . Marital Status: Married     Spouse Name: N/A   . Number of Children: N/A   . Years of Education: N/A     Occupational History   . Not  on file.     Social History Main Topics   . Smoking status: Never Smoker    . Smokeless tobacco: Not on file   . Alcohol Use: No   . Drug Use: No   . Sexual Activity: Not on file     Other Topics Concern   . Not on file     Social History Narrative       The following portions of the patient's history were reviewed and updated as appropriate: allergies, current medications, past family history, past medical history, past social history, past surgical history and problem list.    Review of Systems   Constitutional: Positive for chills, weight loss and diaphoresis. Negative for fever and malaise/fatigue.   HENT: Negative for ear discharge, ear pain, hearing loss and tinnitus.    Eyes: Positive for blurred vision and double vision. Negative for pain, discharge and redness.   Respiratory: Positive for cough and wheezing. Negative for shortness of breath.    Cardiovascular: Positive for palpitations and leg swelling. Negative for chest pain and claudication.   Gastrointestinal: Positive for heartburn and constipation. Negative for nausea, vomiting, abdominal pain and diarrhea.   Genitourinary: Positive for urgency and frequency. Negative for dysuria.   Musculoskeletal: Positive for myalgias, back pain and joint pain. Negative for neck pain.   Skin: Negative for itching and rash.   Neurological: Positive for dizziness, tingling and headaches. Negative for weakness.   Endo/Heme/Allergies: Positive for polydipsia. Bruises/bleeds easily.   Psychiatric/Behavioral: Positive for depression and memory loss. The patient is nervous/anxious. The patient does not have insomnia.        Vitals:  BP 125/84 mmHg  Pulse 96  Resp 16  Ht 1.651 m (5\' 5" )  Wt 86.637 kg (191 lb)  BMI 31.78 kg/m2  SpO2 93%   Wt Readings from Last 3 Encounters:   04/20/16 86.637 kg (191 lb)   04/14/16 86.183 kg (190 lb)     Physical Examination:  General appearance: alert, appears stated age, cooperative and no distress  Eyes: conjunctivae/corneas clear.  PERRL, EOM's intact.   Neck: no cervical lymphadenopathy, no carotid bruit,  supple, symmetrical, trachea midline   Thyroid:normal sized on inspection; non tender to palpation and no discrete nodules palpated  Lungs: clear to auscultation bilaterally  Heart: regular rate and rhythm, S1, S2 normal, no murmur, click, rub or gallop  Abdomen: soft, non-tender; bowel sounds normal; no masses,  no  organomegaly  Extremities: extremities normal, atraumatic, no cyanosis or edema  Foot:  Pedal pulses 2+ bilaterally; no lesions or ulcers; sensation intact to monofilament and vibration by TF testing   Skin: Skin color, texture, turgor normal. No rashes or lesions  Neurologic: Alert and oriented X 3, normal strength and tone. Normal symmetric reflexes. No tremor  Foot Exam: monofilament testing of plantar aspect of first toe and metatarsal joints (10g monofilament) intact bilaterally, no fissures, maceration, ulceration bilaterally, normal hair growth bilaterally, normal pigmentation bilaterally and vibratory perception (128 Hz tuning fork) applied to first toe intact bilaterally    ASSESSMENT AND PLAN    1. Uncontrolled type 2 diabetes mellitus with hyperglycemia, with long-term current use of insulin  Lab Results   Component Value Date    HGBA1C 8.1* 11/25/2010       Current A1C is significantly elevated above goal and trend has significantly increased compared to prior values..  Recommend optimizing low carbohydrate diet efforts and drinking at least 60 ounces of H20 daily.  Exercise goal - 150 minutes of sustained cardio exercise per week. Recommend adding the following DM medication class to the current regimen - Sulfonylurea. Side effects of Sulfonylurea (hypoglycemia, nausea, dizziness, headache, weight gain) discussed with patient. Recommend home glucometer testing AC and HS.  Retinopathy surveillance is UTD. Podiatry surveillance is UTD. Microalbumin testing is UTD within past year.    1. Please check sugars x3/ day-  fasting, prior to lunch and 2h post dinner.  2. Please increase lantus to 20 units at bedtime; if blood sugars are over 150, increase the dose to 25 units in 3 days.  3. Recommend glipizide XL 20 mg daily with breakfast.  4. Please report sugar readings to [place] in 1 week.  5. Please schedule appointment with dietitian Amy Johnson    - glipiZIDE XL (GLUCOTROL) 10 MG 24 hr tablet; Take 2 tabs by mouth daily with breakfast  Dispense: 180 tablet; Refill: 1    2. Mixed hyperlipidemia  LDL not at goal; recently resumed statins by PCP    3. Benign hypertension  BP controlled on the current meds.    4. Long-term insulin use      RTC in 6 weeks    Tanya Nones MD MPH  Endocrinologist, [facility]    Consult note routed to referring provider.

## 2016-04-29 ENCOUNTER — Ambulatory Visit: Payer: Medicare Other | Admitting: "Endocrinology

## 2016-06-01 ENCOUNTER — Ambulatory Visit (INDEPENDENT_AMBULATORY_CARE_PROVIDER_SITE_OTHER): Payer: Medicare Other | Admitting: Internal Medicine

## 2016-06-01 ENCOUNTER — Encounter (INDEPENDENT_AMBULATORY_CARE_PROVIDER_SITE_OTHER): Payer: Self-pay | Admitting: Internal Medicine

## 2016-06-01 VITALS — BP 122/79 | HR 77 | Temp 98.3°F | Resp 16 | Ht 65.0 in | Wt 196.8 lb

## 2016-06-01 DIAGNOSIS — Z794 Long term (current) use of insulin: Secondary | ICD-10-CM

## 2016-06-01 DIAGNOSIS — E782 Mixed hyperlipidemia: Secondary | ICD-10-CM

## 2016-06-01 DIAGNOSIS — E1165 Type 2 diabetes mellitus with hyperglycemia: Secondary | ICD-10-CM

## 2016-06-01 DIAGNOSIS — I10 Essential (primary) hypertension: Secondary | ICD-10-CM

## 2016-06-01 MED ORDER — LIRAGLUTIDE 18 MG/3ML SC SOPN
PEN_INJECTOR | SUBCUTANEOUS | Status: DC
Start: 2016-06-01 — End: 2016-11-08

## 2016-06-01 MED ORDER — LANTUS SOLOSTAR 100 UNIT/ML SC SOPN
PEN_INJECTOR | SUBCUTANEOUS | Status: DC
Start: 2016-06-01 — End: 2016-07-04

## 2016-06-01 NOTE — Patient Instructions (Signed)
1. Please continue the current dose of glipizide.  2. Please inject into skin 0.6 mg liraglutide daily; after 3 days increase the dose of liraglutide to 1.2 mg daily. After additional 3 days, recommend increasing the dose of liraglutide to 1.8 mg daily.  3. Recommend decreasing the dose of lantus to  20 units; after increasing the dose of liraglutide to 1.8 mg, recommend decreasing the dose of lantus to 10 units.

## 2016-06-01 NOTE — Progress Notes (Signed)
Subjective:      Date: 06/01/2016 2:42 PM   Patient ID: Emily Rowe is a 65 y.o. female.    Chief Complaint:  Chief Complaint   Patient presents with   . Uncontrolled type 2 diabetes mellitus with hyperglycemia, wi       HPI:  HPI     Visit type: follow up  Type: Type II and Non-insulin requiring  Evaluation: diagnosed 5 years ago ; moved from [place] in 1978.  Last follow-up: 6 weeks ago    Diabetes Medication Regimen: Lantus 30 units in the AM , Glipizide 20 mg   Medication effectiveness/adherence: not working  Medication side effects: none  Presenting symptoms at time of diagnosis: Stroke  Prior medications: Glipizide and metformin (has side effects of diarrhea with metformin)  Daily basal insulin dosing: Lantus 10/10 units  Daily bolus insulin dosing: non-insulin requiring  Current symptoms: tingling/numbness in extremities , blurred vision   Patient denies: no other symptoms including chest pain, shortness of breath, hypoglycemic episodes, polydipsia, polyuria, headaches, dizziness, or abdominal pain  Exercise: daily and walking  Diet: moderate compliance with and well-balanced diet; grows her vegetables in the garden   Complications: hyperglycemia  Pertinent medical history: neuropathy and stroke  Home glucose readings: Download [id number]  A1c result: 14.0%  A1c trend is increasing  Microalbumin trend: No results found for: MICROALBUMIN  Microalbumin trend is variable  LDL trend:   Lab Results   Component Value Date    LDL 154* 11/18/2010     LDL trend is increasing  Additional concerns: In addition, pt has a history of hyperlipidemia and is compliant with lipid therapy.  Pt denies side effects of lipid therapy - specifically denies myalgia., In addition, pt has a history of stable chronic, essential HTN with no evidence of CHF or proteinuria.  The patient is compliant with anti-hypertensive therapy and denies side effects to therapy.  Pt denies CP, SOB, dizziness, orthopnea, PND or edema.     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    Has a h/o CVA a few years ago; reports complete recovery of function following PT.  Problem List:  Patient Active Problem List   Diagnosis   . Uncontrolled type 2 diabetes mellitus with hyperglycemia, with long-term current use of insulin   . Mixed hyperlipidemia   . Benign hypertension   . Long-term insulin use       Current Medications:  Current Outpatient Prescriptions   Medication Sig Dispense Refill   . BD ULTRA-FINE PEN NEEDLES 29G X 12.7MM Misc TO USE WITH LANTUS SOLOSTAR PEN.  2   . glipiZIDE XL (GLUCOTROL) 10 MG 24 hr tablet Take 2 tabs by mouth daily with breakfast 180 tablet 1   . insulin glargine (LANTUS SOLOSTAR) 100 UNIT/ML injection pen INJECT 4 UNITS ONCE A DAY AT BEDTIME AND WILL INCREASE PER PHYSICIAN RECOMMENDATION  0   . NON FORMULARY Levanlodipino besilato 2.5     . NON FORMULARY Diupress 2.5 mg daily       No current facility-administered medications for this visit.       Allergies:  Allergies   Allergen Reactions   . Codeine    . Glucosamine    . Latex        Past Medical History:  Past Medical History   Diagnosis Date   . Hypertensive disorder    . Diabetes    . Stroke    .  Hyperlipemia    . TIA (transient ischemic attack)    . Neuropathy        Past Surgical History:  Past Surgical History   Procedure Laterality Date   . Hysterectomy     . Tonsillectomy     . Reduction mammaplasty     . Urinary bladder         Family History:  Family History   Problem Relation Age of Onset   . Diabetes Mother    . Diabetes Maternal Grandmother        Social History:  Social History     Social History   . Marital Status: Married     Spouse Name: N/A   . Number of Children: N/A   . Years of Education: N/A     Occupational History   . Not on file.     Social History Main Topics   . Smoking status: Never Smoker    . Smokeless tobacco: Not on file   . Alcohol Use: No   . Drug Use: No   . Sexual Activity: Not on file     Other Topics  Concern   . Not on file     Social History Narrative       The following portions of the patient's history were reviewed and updated as appropriate: allergies, current medications, past family history, past medical history, past social history, past surgical history and problem list.    Review of Systems   Constitutional: Positive for chills, weight loss and diaphoresis. Negative for fever and malaise/fatigue.   HENT: Negative for ear discharge, ear pain, hearing loss and tinnitus.    Eyes: Positive for blurred vision and double vision. Negative for pain, discharge and redness.   Respiratory: Positive for cough and wheezing. Negative for shortness of breath.    Cardiovascular: Positive for palpitations and leg swelling. Negative for chest pain and claudication.   Gastrointestinal: Positive for heartburn and constipation. Negative for nausea, vomiting, abdominal pain and diarrhea.   Genitourinary: Positive for urgency and frequency. Negative for dysuria.   Musculoskeletal: Positive for myalgias, back pain and joint pain. Negative for neck pain.   Skin: Negative for itching and rash.   Neurological: Positive for dizziness, tingling and headaches. Negative for weakness.   Endo/Heme/Allergies: Positive for polydipsia. Bruises/bleeds easily.   Psychiatric/Behavioral: Positive for depression and memory loss. The patient is nervous/anxious. The patient does not have insomnia.        Vitals:  BP 122/79 mmHg  Pulse 77  Temp(Src) 98.3 F (36.8 C)  Resp 16  Ht 1.651 m (5\' 5" )  Wt 89.268 kg (196 lb 12.8 oz)  BMI 32.75 kg/m2  SpO2 94%   Wt Readings from Last 3 Encounters:   06/01/16 89.268 kg (196 lb 12.8 oz)   04/20/16 86.637 kg (191 lb)   04/14/16 86.183 kg (190 lb)     Physical Examination:  General appearance: alert, appears stated age, cooperative and no distress  Eyes: conjunctivae/corneas clear. PERRL, EOM's intact.   Neck: no cervical lymphadenopathy, no carotid bruit,  supple, symmetrical, trachea midline    Thyroid:normal sized on inspection; non tender to palpation and no discrete nodules palpated  Lungs: clear to auscultation bilaterally  Heart: regular rate and rhythm, S1, S2 normal, no murmur, click, rub or gallop  Abdomen: soft, non-tender; bowel sounds normal; no masses,  no organomegaly  Extremities: extremities normal, atraumatic, no cyanosis or edema  Foot:  Pedal pulses 2+ bilaterally; no lesions or ulcers;  sensation intact to monofilament and vibration by TF testing   Skin: Skin color, texture, turgor normal. No rashes or lesions  Neurologic: Alert and oriented X 3, normal strength and tone. Normal symmetric reflexes. No tremor  Foot Exam: monofilament testing of plantar aspect of first toe and metatarsal joints (10g monofilament) intact bilaterally, no fissures, maceration, ulceration bilaterally, normal hair growth bilaterally, normal pigmentation bilaterally and vibratory perception (128 Hz tuning fork) applied to first toe intact bilaterally    ASSESSMENT AND PLAN    1. Uncontrolled type 2 diabetes mellitus with hyperglycemia, with long-term current use of insulin  Lab Results   Component Value Date    HGBA1C 8.1* 11/25/2010       Current A1C is significantly elevated above goal and trend has significantly increased compared to prior values..  Recommend optimizing low carbohydrate diet efforts and drinking at least 60 ounces of H20 daily.  Exercise goal - 150 minutes of sustained cardio exercise per week. Recommend adding the following DM medication class to the current regimen - Sulfonylurea. Side effects of Sulfonylurea (hypoglycemia, nausea, dizziness, headache, weight gain) discussed with patient. Recommend home glucometer testing AC and HS.  Retinopathy surveillance is UTD. Podiatry surveillance is UTD. Microalbumin testing is UTD within past year.    1. Please check sugars x3/ day- fasting, prior to lunch and 2h post dinner.  2. Please continue lantus  3. Recommend glipizide XL 20 mg daily with  breakfast.  4. Please report sugar readings to [place] in 1 week.  5. Please start taking victoza 0.6 mg daily for 3 days, then increase the dose to 1.2 mg daily. After additional 3 days, recommend increasing the dose to 1.8 mg daily. Risk & Benefits of the new medication(s) were explained to the patient (and family) who verbalized understanding & agreed to the treatment plan. Patient (family) encouraged to contact me/clinical staff with any questions/concerns    - glipiZIDE XL (GLUCOTROL) 10 MG 24 hr tablet; Take 2 tabs by mouth daily with breakfast  Dispense: 180 tablet; Refill: 1    2. Mixed hyperlipidemia  LDL not at goal; recently resumed statins by PCP    3. Benign hypertension  BP controlled on the current meds.    4. Long-term insulin use      RTC in 6 weeks    Tanya Nones MD MPH  Endocrinologist, [facility]

## 2016-06-02 LAB — MICROALBUMIN, RANDOM URINE
Urine Creatinine, Random: 100.1 mg/dL
Urine Microalbumin, Random: <5 (ref 0.0–30.0)

## 2016-06-08 ENCOUNTER — Encounter (INDEPENDENT_AMBULATORY_CARE_PROVIDER_SITE_OTHER): Payer: Self-pay | Admitting: Internal Medicine

## 2016-06-08 ENCOUNTER — Other Ambulatory Visit (INDEPENDENT_AMBULATORY_CARE_PROVIDER_SITE_OTHER): Payer: Self-pay

## 2016-06-08 DIAGNOSIS — E119 Type 2 diabetes mellitus without complications: Secondary | ICD-10-CM

## 2016-06-08 MED ORDER — PEN NEEDLES 32G X 4 MM MISC
1.0000 [IU] | Freq: Every day | Status: DC
Start: 2016-06-08 — End: 2017-06-30

## 2016-06-08 NOTE — Telephone Encounter (Signed)
Pt didn't get script for needles to go with Victoza

## 2016-06-14 NOTE — Progress Notes (Signed)
Quick Note:    Please call with the following: Lab results are in the normal range.  ______

## 2016-07-04 ENCOUNTER — Other Ambulatory Visit (INDEPENDENT_AMBULATORY_CARE_PROVIDER_SITE_OTHER): Payer: Self-pay

## 2016-07-04 DIAGNOSIS — E1165 Type 2 diabetes mellitus with hyperglycemia: Secondary | ICD-10-CM

## 2016-07-04 MED ORDER — LANTUS SOLOSTAR 100 UNIT/ML SC SOPN
PEN_INJECTOR | SUBCUTANEOUS | 1 refills | Status: DC
Start: 2016-07-04 — End: 2016-12-16

## 2016-07-04 NOTE — Telephone Encounter (Signed)
See note below please.  

## 2016-07-04 NOTE — Telephone Encounter (Signed)
Pt states she cannot take the Victoza due to side effects of nausea, headache and anxiety

## 2016-07-04 NOTE — Telephone Encounter (Signed)
The idea is for [place] to be able to wean her off insulin. Please have her drop the dose of insulin to 14 units but stay on victoza. Thanks!

## 2016-07-04 NOTE — Telephone Encounter (Signed)
lv 06/01/16

## 2016-07-04 NOTE — Telephone Encounter (Signed)
Pt reports taking both insulin 32 units and Victoza 0.6mg  her blood sugar decreases to 60, even when she reduces her insulin to 20 units daily and 0.6mg  Victoza her blood sugar drops to 65  She doesn't think she can tolerate the Victoza with insulin

## 2016-07-05 NOTE — Telephone Encounter (Signed)
Please have her D/C victoza for now, retry again in 1 week when symptoms have resolved. Victoza is a good medication which has cardiac protection benefit and is therefore preferred. Thanks!

## 2016-07-06 NOTE — Telephone Encounter (Signed)
Left msg to call the office

## 2016-07-15 ENCOUNTER — Other Ambulatory Visit (INDEPENDENT_AMBULATORY_CARE_PROVIDER_SITE_OTHER): Payer: Self-pay

## 2016-07-15 NOTE — Telephone Encounter (Signed)
Error

## 2016-07-22 ENCOUNTER — Ambulatory Visit (INDEPENDENT_AMBULATORY_CARE_PROVIDER_SITE_OTHER): Payer: Medicare Other | Admitting: Internal Medicine

## 2016-07-28 ENCOUNTER — Encounter (INDEPENDENT_AMBULATORY_CARE_PROVIDER_SITE_OTHER): Payer: Self-pay | Admitting: Internal Medicine

## 2016-07-28 ENCOUNTER — Ambulatory Visit (FREE_STANDING_LABORATORY_FACILITY): Payer: Medicare Other | Admitting: Internal Medicine

## 2016-07-28 VITALS — BP 135/87 | HR 82 | Temp 98.8°F | Resp 16 | Ht 65.0 in | Wt 203.0 lb

## 2016-07-28 DIAGNOSIS — E1165 Type 2 diabetes mellitus with hyperglycemia: Secondary | ICD-10-CM

## 2016-07-28 DIAGNOSIS — E782 Mixed hyperlipidemia: Secondary | ICD-10-CM

## 2016-07-28 DIAGNOSIS — I10 Essential (primary) hypertension: Secondary | ICD-10-CM

## 2016-07-28 DIAGNOSIS — Z794 Long term (current) use of insulin: Secondary | ICD-10-CM

## 2016-07-28 LAB — COMPREHENSIVE METABOLIC PANEL
ALT: 17 U/L (ref 0–55)
AST (SGOT): 17 U/L (ref 5–34)
Albumin/Globulin Ratio: 0.9 (ref 0.9–2.2)
Albumin: 3.5 g/dL (ref 3.5–5.0)
Alkaline Phosphatase: 115 U/L — ABNORMAL HIGH (ref 37–106)
BUN: 19 mg/dL (ref 7.0–19.0)
Bilirubin, Total: 0.2 mg/dL (ref 0.1–1.2)
CO2: 29 mEq/L (ref 21–30)
Calcium: 9.6 mg/dL (ref 8.5–10.5)
Chloride: 99 mEq/L — ABNORMAL LOW (ref 100–111)
Creatinine: 0.8 mg/dL (ref 0.4–1.5)
Globulin: 3.8 g/dL — ABNORMAL HIGH (ref 2.0–3.7)
Glucose: 269 mg/dL — ABNORMAL HIGH (ref 70–100)
Potassium: 3.6 mEq/L (ref 3.5–5.1)
Protein, Total: 7.3 g/dL (ref 6.0–8.3)
Sodium: 138 mEq/L (ref 135–146)

## 2016-07-28 LAB — GFR: EGFR: >60

## 2016-07-28 LAB — HEMOLYSIS INDEX: Hemolysis Index: 3 (ref 0–18)

## 2016-07-28 MED ORDER — EMPAGLIFLOZIN 10 MG PO TABS
10.0000 mg | ORAL_TABLET | Freq: Every day | ORAL | 1 refills | Status: DC
Start: 2016-07-28 — End: 2016-08-02

## 2016-07-28 NOTE — Progress Notes (Signed)
Subjective:      Date: 07/28/2016 2:10 PM   Patient ID: Emily Rowe is a 65 y.o. female.    Chief Complaint:  Chief Complaint   Patient presents with   . Uncontrolled type 2 diabetes mellitus with hyperglycemia, wi     HPI     Visit type: follow up  Type: Type II and Non-insulin requiring  Evaluation: diagnosed 5 years ago ; moved from [place] in 1978.  Last follow-up: 6 weeks ago  Diabetes Medication Regimen: Lantus 32 units in the AM, Glipizide 20 mg ; stopped victoza 07/04/16 due to intolerance and severe nausea.  Medication effectiveness/adherence: not working  Medication side effects: dizziness and nausea ; due to Victoza    Presenting symptoms at time of diagnosis: Stroke  Prior medications: Glipizide and metformin (has side effects of diarrhea with metformin)  Daily basal insulin dosing: Lantus 32 units   Daily bolus insulin dosing: non-insulin requiring  Current symptoms: tingling/numbness in extremities , blurred vision   Patient denies: no other symptoms including chest pain, shortness of breath, hypoglycemic episodes, polydipsia, polyuria, headaches, dizziness, or abdominal pain  Exercise: daily and walking  Diet: moderate compliance with and well-balanced diet; grows her vegetables in the garden   Complications: hyperglycemia  Pertinent medical history: neuropathy and stroke  Home glucose readings: Download [id number]  A1c result: 14.0%  A1c trend is increasing  Microalbumin trend: No results found for: MICROALBUMIN  Microalbumin trend is variable  LDL trend:   Lab Results   Component Value Date    LDL 154 (H) 11/18/2010     LDL trend is increasing  Additional concerns: In addition, pt has a history of hyperlipidemia and is compliant with lipid therapy.  Pt denies side effects of lipid therapy - specifically denies myalgia., In addition, pt has a history of stable chronic, essential HTN with no evidence of CHF or proteinuria.  The patient is compliant with anti-hypertensive therapy and denies side  effects to therapy.  Pt denies CP, SOB, dizziness, orthopnea, PND or edema.     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    Has a h/o CVA a few years ago; reports complete recovery of function following PT.    Problem List:  Patient Active Problem List   Diagnosis   . Uncontrolled type 2 diabetes mellitus with hyperglycemia, with long-term current use of insulin   . Mixed hyperlipidemia   . Benign hypertension   . Long-term insulin use       Current Medications:  Current Outpatient Prescriptions   Medication Sig Dispense Refill   . glipiZIDE XL (GLUCOTROL) 10 MG 24 hr tablet Take 2 tabs by mouth daily with breakfast 180 tablet 1   . insulin glargine (LANTUS SOLOSTAR) 100 UNIT/ML injection pen Inject 32 units into skin daily 30 mL 1   . Insulin Pen Needle (PEN NEEDLES) 32G X 4 MM Misc 1 Unit by Does not apply route daily. 100 each 1   . NON FORMULARY Levanlodipino besilato 2.5     . NON FORMULARY Diupress 2.5 mg daily     . liraglutide (VICTOZA) 18 MG/3ML Solution Pen-injector Inject 1.8 mg into skin daily 9 pen 1     No current facility-administered medications for this visit.        Allergies:  Allergies   Allergen Reactions   . Codeine    . Glucosamine    . Latex  Past Medical History:  Past Medical History:   Diagnosis Date   . Diabetes    . Hyperlipemia    . Hypertensive disorder    . Neuropathy    . Stroke    . TIA (transient ischemic attack)        Past Surgical History:  Past Surgical History:   Procedure Laterality Date   . HYSTERECTOMY     . REDUCTION MAMMAPLASTY     . TONSILLECTOMY     . urinary bladder         Family History:  Family History   Problem Relation Age of Onset   . Diabetes Mother    . Diabetes Maternal Grandmother        Social History:  Social History     Social History   . Marital status: Married     Spouse name: N/A   . Number of children: N/A   . Years of education: N/A     Occupational History   . Not on file.     Social History Main Topics    . Smoking status: Never Smoker   . Smokeless tobacco: Not on file   . Alcohol use No   . Drug use: No   . Sexual activity: Not on file     Other Topics Concern   . Not on file     Social History Narrative   . No narrative on file       The following portions of the patient's history were reviewed and updated as appropriate: allergies, current medications, past family history, past medical history, past social history, past surgical history and problem list.    Review of Systems   Constitutional: Positive for chills, diaphoresis and weight loss. Negative for fever and malaise/fatigue.   HENT: Negative for ear discharge, ear pain, hearing loss and tinnitus.    Eyes: Positive for blurred vision and double vision. Negative for pain, discharge and redness.   Respiratory: Positive for cough and wheezing. Negative for shortness of breath.    Cardiovascular: Positive for palpitations and leg swelling. Negative for chest pain and claudication.   Gastrointestinal: Positive for constipation and heartburn. Negative for abdominal pain, diarrhea, nausea and vomiting.   Genitourinary: Positive for frequency and urgency. Negative for dysuria.   Musculoskeletal: Positive for back pain, joint pain and myalgias. Negative for neck pain.   Skin: Negative for itching and rash.   Neurological: Positive for dizziness, tingling and headaches. Negative for weakness.   Endo/Heme/Allergies: Positive for polydipsia. Bruises/bleeds easily.   Psychiatric/Behavioral: Positive for depression and memory loss. The patient is nervous/anxious. The patient does not have insomnia.        Vitals:  BP 135/87   Pulse 82   Temp 98.8 F (37.1 C)   Resp 16   Ht 1.651 m (5\' 5" )   Wt 92.1 kg (203 lb)   SpO2 95%   BMI 33.78 kg/m    Wt Readings from Last 3 Encounters:   07/28/16 92.1 kg (203 lb)   06/01/16 89.3 kg (196 lb 12.8 oz)   04/20/16 86.6 kg (191 lb)     Physical Examination:  General appearance: alert, appears stated age, cooperative and no  distress  Eyes: conjunctivae/corneas clear. PERRL, EOM's intact.   Neck: no cervical lymphadenopathy, no carotid bruit,  supple, symmetrical, trachea midline   Thyroid:normal sized on inspection; non tender to palpation and no discrete nodules palpated  Lungs: clear to auscultation bilaterally  Heart: regular rate and rhythm,  S1, S2 normal, no murmur, click, rub or gallop  Abdomen: soft, non-tender; bowel sounds normal; no masses,  no organomegaly  Extremities: extremities normal, atraumatic, no cyanosis or edema  Foot:  Pedal pulses 2+ bilaterally; no lesions or ulcers; sensation intact to monofilament and vibration by TF testing   Skin: Skin color, texture, turgor normal. No rashes or lesions  Neurologic: Alert and oriented X 3, normal strength and tone. Normal symmetric reflexes. No tremor  Foot Exam: monofilament testing of plantar aspect of first toe and metatarsal joints (10g monofilament) intact bilaterally, no fissures, maceration, ulceration bilaterally, normal hair growth bilaterally, normal pigmentation bilaterally and vibratory perception (128 Hz tuning fork) applied to first toe intact bilaterally    ASSESSMENT AND PLAN    1. Uncontrolled type 2 diabetes mellitus with hyperglycemia, with long-term current use of insulin  Lab Results   Component Value Date    HGBA1C 8.1 (H) 11/25/2010       Current A1C is significantly elevated above goal and trend has significantly increased compared to prior values..  Recommend optimizing low carbohydrate diet efforts and drinking at least 60 ounces of H20 daily.  Exercise goal - 150 minutes of sustained cardio exercise per week. Recommend adding the following DM medication class to the current regimen - Sulfonylurea. Side effects of Sulfonylurea (hypoglycemia, nausea, dizziness, headache, weight gain) discussed with patient. Recommend home glucometer testing AC and HS.  Retinopathy surveillance is UTD. Podiatry surveillance is UTD. Microalbumin testing is UTD within  past year.    1. Please check sugars x3/ day- fasting, prior to lunch and 2h post dinner.  2. Please continue lantus  3. Recommend glipizide XL 20 mg daily with breakfast.  4. Please report sugar readings to [place] in 1 week.  5. Recommend jardiance 10 mg daily in addition to above medication regimen given hyperglycemia and cardiac benefit with jardiance. Risk & Benefits of the new medication(s) were explained to the patient (and family) who verbalized understanding & agreed to the treatment plan. Patient (family) encouraged to contact me/clinical staff with any questions/concerns      - glipiZIDE XL (GLUCOTROL) 10 MG 24 hr tablet; Take 2 tabs by mouth daily with breakfast  Dispense: 180 tablet; Refill: 1    2. Mixed hyperlipidemia  LDL not at goal; recently resumed statins by PCP    3. Benign hypertension  BP controlled on the current meds.    4. Long-term insulin use      RTC in 6 weeks    Tanya Nones MD MPH  Endocrinologist, [facility]

## 2016-07-29 LAB — HEMOGLOBIN A1C
Average Estimated Glucose: 223.1 mg/dL
Hemoglobin A1C: 9.4 % — ABNORMAL HIGH (ref 4.6–5.9)

## 2016-07-31 ENCOUNTER — Encounter (INDEPENDENT_AMBULATORY_CARE_PROVIDER_SITE_OTHER): Payer: Self-pay | Admitting: Internal Medicine

## 2016-08-01 ENCOUNTER — Telehealth (INDEPENDENT_AMBULATORY_CARE_PROVIDER_SITE_OTHER): Payer: Self-pay

## 2016-08-01 NOTE — Telephone Encounter (Signed)
Ok to switch to invokana 100 mg daily. Thanks!

## 2016-08-01 NOTE — Telephone Encounter (Signed)
Attempted to call; vm is full

## 2016-08-01 NOTE — Telephone Encounter (Signed)
[place] is not on formulary - please choose from the following: Invokana, Invokamet, Farxiga and [employer]

## 2016-08-02 ENCOUNTER — Encounter (INDEPENDENT_AMBULATORY_CARE_PROVIDER_SITE_OTHER): Payer: Self-pay | Admitting: Internal Medicine

## 2016-08-02 MED ORDER — CANAGLIFLOZIN 100 MG PO TABS
100.0000 mg | ORAL_TABLET | Freq: Every day | ORAL | 1 refills | Status: DC
Start: 2016-08-02 — End: 2016-11-08

## 2016-08-02 NOTE — Telephone Encounter (Signed)
Patient aware of results and recommendations and verbalized understanding  Med called in

## 2016-08-09 NOTE — Progress Notes (Signed)
Please call with the following: A1c/ 3 month estimate of avg sugars is in the range of uncontrolled diabetes. Target is less than 7%. One of the liver tests is slightly high; the rest of the lab results are in the acceptable range. Please ensure compliance with checking your sugars at least x2/ day and taking medications as instructed regularly. Please bring sugar log to your follow up visit.

## 2016-08-10 ENCOUNTER — Telehealth (INDEPENDENT_AMBULATORY_CARE_PROVIDER_SITE_OTHER): Payer: Self-pay

## 2016-08-10 NOTE — Telephone Encounter (Signed)
Side effects of dizziness, nausea, heaviness, difficulty walking, numbness all on Invokana  Feeling is similar to having a stroke

## 2016-08-10 NOTE — Telephone Encounter (Signed)
Per Dr. Smith Robert, hydrate well, if still having symptoms she should go for ER evaluation given her history  Left msg to call the office

## 2016-09-01 ENCOUNTER — Ambulatory Visit (INDEPENDENT_AMBULATORY_CARE_PROVIDER_SITE_OTHER): Payer: Medicare Other | Admitting: Internal Medicine

## 2016-09-05 ENCOUNTER — Other Ambulatory Visit (INDEPENDENT_AMBULATORY_CARE_PROVIDER_SITE_OTHER): Payer: Self-pay | Admitting: Internal Medicine

## 2016-09-17 ENCOUNTER — Other Ambulatory Visit (INDEPENDENT_AMBULATORY_CARE_PROVIDER_SITE_OTHER): Payer: Self-pay | Admitting: Internal Medicine

## 2016-09-18 NOTE — Telephone Encounter (Signed)
Needs to schedule follow up within the next 3 months

## 2016-09-19 NOTE — Telephone Encounter (Signed)
Please schedule f/u

## 2016-09-20 NOTE — Telephone Encounter (Signed)
Pt scheduled for 1/23 follow up at 11:45. She states that she has been out of Glipizide for 2 weeks. She is requesting a refill asap.    Pt call back #[phone]

## 2016-10-25 ENCOUNTER — Encounter (INDEPENDENT_AMBULATORY_CARE_PROVIDER_SITE_OTHER): Payer: Self-pay | Admitting: Internal Medicine

## 2016-11-08 ENCOUNTER — Ambulatory Visit (FREE_STANDING_LABORATORY_FACILITY): Payer: Medicare Other | Admitting: Internal Medicine

## 2016-11-08 ENCOUNTER — Encounter (INDEPENDENT_AMBULATORY_CARE_PROVIDER_SITE_OTHER): Payer: Self-pay | Admitting: Internal Medicine

## 2016-11-08 VITALS — BP 127/79 | HR 83 | Resp 16 | Ht 65.0 in | Wt 209.0 lb

## 2016-11-08 DIAGNOSIS — E1165 Type 2 diabetes mellitus with hyperglycemia: Secondary | ICD-10-CM

## 2016-11-08 DIAGNOSIS — Z794 Long term (current) use of insulin: Secondary | ICD-10-CM

## 2016-11-08 DIAGNOSIS — E010 Iodine-deficiency related diffuse (endemic) goiter: Secondary | ICD-10-CM

## 2016-11-08 DIAGNOSIS — I10 Essential (primary) hypertension: Secondary | ICD-10-CM

## 2016-11-08 DIAGNOSIS — E782 Mixed hyperlipidemia: Secondary | ICD-10-CM

## 2016-11-08 LAB — POCT HEMOGLOBIN A1C: POCT Hgb A1C: 8.2 — AB (ref 3.9–5.9)

## 2016-11-08 LAB — MICROALBUMIN, RANDOM URINE
Urine Creatinine, Random: 50.1 mg/dL
Urine Microalbumin, Random: <5 (ref 0.0–30.0)

## 2016-11-08 MED ORDER — LINAGLIPTIN 5 MG PO TABS
5.0000 mg | ORAL_TABLET | Freq: Every day | ORAL | 1 refills | Status: DC
Start: 2016-11-08 — End: 2017-03-16

## 2016-11-08 NOTE — Progress Notes (Signed)
Subjective:      Date: 11/08/2016 11:47 AM   Patient ID: Emily Rowe is a 66 y.o. female.    Chief Complaint:  Chief Complaint   Patient presents with   . Uncontrolled type 2 diabetes mellitus with hyperglycemia, wi     HPI     Visit type: follow up  Type: Type II and Non-insulin requiring  Evaluation: diagnosed 5 years ago ; moved from [place] in 1978.  Last follow-up: 6 weeks ago  Diabetes Medication Regimen: Lantus 32 units in the AM, Glipizide 25 mg ; stopped victoza 07/04/16 due to intolerance and severe nausea. Did not tolerate invokana.  Medication effectiveness/adherence: not working  Medication side effects: dizziness and nausea ; due to Victoza    Presenting symptoms at time of diagnosis: Stroke  Prior medications: Glipizide and metformin (has side effects of diarrhea with metformin)  Daily basal insulin dosing: Lantus 32 units   Daily bolus insulin dosing: non-insulin requiring  Current symptoms: tingling/numbness in extremities , blurred vision   Patient denies: no other symptoms including chest pain, shortness of breath, hypoglycemic episodes, polydipsia, polyuria, headaches, dizziness, or abdominal pain  Exercise: daily and walking  Diet: moderate compliance with and well-balanced diet; grows her vegetables in the garden   Complications: hyperglycemia  Pertinent medical history: neuropathy and stroke  Home glucose readings: Download [id number]  A1c result: 14.0%  A1c trend is increasing  Microalbumin trend: No results found for: MICROALBUMIN  Microalbumin trend is variable  LDL trend:   Lab Results   Component Value Date    LDL 154 (H) 11/18/2010     LDL trend is increasing  Additional concerns: In addition, pt has a history of hyperlipidemia and is compliant with lipid therapy.  Pt denies side effects of lipid therapy - specifically denies myalgia., In addition, pt has a history of stable chronic, essential HTN with no evidence of CHF or proteinuria.  The patient is compliant with anti-hypertensive  therapy and denies side effects to therapy.  Pt denies CP, SOB, dizziness, orthopnea, PND or edema.     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    Has a h/o CVA a few years ago; reports complete recovery of function following PT. Seen by neurologist for symptoms of dizziness.     Reports that her diet has been healthy.  Exercise: Walks and participates in PT.    Problem List:  Patient Active Problem List   Diagnosis   . Uncontrolled type 2 diabetes mellitus with hyperglycemia, with long-term current use of insulin   . Mixed hyperlipidemia   . Benign hypertension   . Long-term insulin use       Current Medications:  Current Outpatient Prescriptions   Medication Sig Dispense Refill   . glipiZIDE XL (GLUCOTROL XL) 5 MG 24 hr tablet TAKE 2 TABLET BY MOUTH IN THE MORNING (Patient taking differently: take one tablet by mouth in the afternoon) 180 tablet 0   . glipiZIDE XL (GLUCOTROL) 10 MG 24 hr tablet Take 2 tabs by mouth daily with breakfast 180 tablet 1   . insulin glargine (LANTUS SOLOSTAR) 100 UNIT/ML injection pen Inject 32 units into skin daily 30 mL 1   . Insulin Pen Needle (PEN NEEDLES) 32G X 4 MM Misc 1 Unit by Does not apply route daily. 100 each 1   . NON FORMULARY Levanlodipino besilato 2.5     . NON FORMULARY Diupress 2.5 mg daily     .  simvastatin (ZOCOR) 40 MG tablet Take 20 mg by mouth nightly.       No current facility-administered medications for this visit.        Allergies:  Allergies   Allergen Reactions   . Codeine    . Glucosamine    . Latex        Past Medical History:  Past Medical History:   Diagnosis Date   . Diabetes    . Hyperlipemia    . Hypertensive disorder    . Neuropathy    . Stroke    . TIA (transient ischemic attack)        Past Surgical History:  Past Surgical History:   Procedure Laterality Date   . HYSTERECTOMY     . REDUCTION MAMMAPLASTY     . TONSILLECTOMY     . urinary bladder         Family History:  Family History   Problem Relation  Age of Onset   . Diabetes Mother    . Diabetes Maternal Grandmother        Social History:  Social History     Social History   . Marital status: Married     Spouse name: N/A   . Number of children: N/A   . Years of education: N/A     Occupational History   . Not on file.     Social History Main Topics   . Smoking status: Never Smoker   . Smokeless tobacco: Never Used   . Alcohol use No   . Drug use: No   . Sexual activity: Not on file     Other Topics Concern   . Not on file     Social History Narrative   . No narrative on file       The following portions of the patient's history were reviewed and updated as appropriate: allergies, current medications, past family history, past medical history, past social history, past surgical history and problem list.    Review of Systems   Constitutional: Positive for chills, diaphoresis and weight loss. Negative for fever and malaise/fatigue.   HENT: Negative for ear discharge, ear pain, hearing loss and tinnitus.    Eyes: Positive for blurred vision and double vision. Negative for pain, discharge and redness.   Respiratory: Positive for cough and wheezing. Negative for shortness of breath.    Cardiovascular: Positive for palpitations and leg swelling. Negative for chest pain and claudication.   Gastrointestinal: Positive for constipation and heartburn. Negative for abdominal pain, diarrhea, nausea and vomiting.   Genitourinary: Positive for frequency and urgency. Negative for dysuria.   Musculoskeletal: Positive for back pain, joint pain and myalgias. Negative for neck pain.   Skin: Negative for itching and rash.   Neurological: Positive for dizziness, tingling and headaches. Negative for weakness.   Endo/Heme/Allergies: Positive for polydipsia. Bruises/bleeds easily.   Psychiatric/Behavioral: Positive for depression and memory loss. The patient is nervous/anxious. The patient does not have insomnia.        Vitals:  BP 127/79   Pulse 83   Resp 16   Ht 1.651 m (5\' 5" )    Wt 94.8 kg (209 lb)   SpO2 95%   BMI 34.78 kg/m    Wt Readings from Last 3 Encounters:   11/08/16 94.8 kg (209 lb)   07/28/16 92.1 kg (203 lb)   06/01/16 89.3 kg (196 lb 12.8 oz)     Physical Examination:  General appearance: alert, appears stated age, cooperative  and no distress  Eyes: conjunctivae/corneas clear. PERRL, EOM's intact.   Neck: no cervical lymphadenopathy, no carotid bruit,  supple, symmetrical, trachea midline   Thyroid:normal sized on inspection; non tender to palpation and no discrete nodules palpated  Lungs: clear to auscultation bilaterally  Heart: regular rate and rhythm, S1, S2 normal, no murmur, click, rub or gallop  Abdomen: soft, non-tender; bowel sounds normal; no masses,  no organomegaly  Extremities: extremities normal, atraumatic, no cyanosis or edema  Foot:  Pedal pulses 2+ bilaterally; no lesions or ulcers; sensation intact to monofilament and vibration by TF testing   Skin: Skin color, texture, turgor normal. No rashes or lesions  Neurologic: Alert and oriented X 3, normal strength and tone. Normal symmetric reflexes. No tremor  Foot Exam: monofilament testing of plantar aspect of first toe and metatarsal joints (10g monofilament) intact bilaterally, no fissures, maceration, ulceration bilaterally, normal hair growth bilaterally, normal pigmentation bilaterally and vibratory perception (128 Hz tuning fork) applied to first toe intact bilaterally    ASSESSMENT AND PLAN    1. Uncontrolled type 2 diabetes mellitus with hyperglycemia, with long-term current use of insulin  Lab Results   Component Value Date    HGBA1C 9.4 (H) 07/28/2016    HGBA1C 8.1 (H) 11/25/2010       Current A1C is significantly elevated above goal and trend has significantly increased compared to prior values..  Recommend optimizing low carbohydrate diet efforts and drinking at least 60 ounces of H20 daily.  Exercise goal - 150 minutes of sustained cardio exercise per week. Recommend adding the following DM  medication class to the current regimen - Sulfonylurea. Side effects of Sulfonylurea (hypoglycemia, nausea, dizziness, headache, weight gain) discussed with patient. Recommend home glucometer testing AC and HS.  Retinopathy surveillance is UTD. Podiatry surveillance is UTD. Microalbumin testing is UTD within past year.    1. Please check sugars x3/ day- fasting, prior to lunch and 2h post dinner.  2. Please continue lantus  3. Recommend glipizide XL 20 mg daily with breakfast.  4. Please report sugar readings to [place] in 1 week.  5. Recommend tradjenta 5 mg daily. Risk & Benefits of the new medication(s) were explained to the patient (and family) who verbalized understanding & agreed to the treatment plan. Patient (family) encouraged to contact me/clinical staff with any questions/concerns    - glipiZIDE XL (GLUCOTROL) 10 MG 24 hr tablet; Take 2 tabs by mouth daily with breakfast  Dispense: 180 tablet; Refill: 1    2. Mixed hyperlipidemia  LDL not at goal; recently resumed statins by PCP    3. Benign hypertension  BP controlled on the current meds.    4. Long-term insulin use      RTC in 6 weeks    Tanya Nones MD MPH  Endocrinologist, [facility]

## 2016-11-13 NOTE — Progress Notes (Signed)
A1c/ 3 month estimate of avg sugars is in the range of uncontrolled diabetes. Target A1c is less than 7%. Bad cholesterol level is slightly high; the rest of the lab results are in the acceptable range. Please ensure compliance with checking your sugars at least x2/ day and taking medications as instructed regularly. Please bring sugar log to your follow up visit.

## 2016-11-14 ENCOUNTER — Other Ambulatory Visit: Payer: Medicare Other

## 2016-12-16 ENCOUNTER — Other Ambulatory Visit (INDEPENDENT_AMBULATORY_CARE_PROVIDER_SITE_OTHER): Payer: Self-pay | Admitting: Internal Medicine

## 2016-12-16 DIAGNOSIS — E1165 Type 2 diabetes mellitus with hyperglycemia: Secondary | ICD-10-CM

## 2016-12-20 ENCOUNTER — Ambulatory Visit (INDEPENDENT_AMBULATORY_CARE_PROVIDER_SITE_OTHER): Payer: Medicare Other | Admitting: "Endocrinology

## 2016-12-20 ENCOUNTER — Encounter (INDEPENDENT_AMBULATORY_CARE_PROVIDER_SITE_OTHER): Payer: Self-pay | Admitting: "Endocrinology

## 2016-12-20 VITALS — BP 116/73 | HR 80 | Temp 97.8°F | Resp 18 | Ht 65.0 in | Wt 212.6 lb

## 2016-12-20 DIAGNOSIS — Z6835 Body mass index (BMI) 35.0-35.9, adult: Secondary | ICD-10-CM

## 2016-12-20 DIAGNOSIS — IMO0002 Reserved for concepts with insufficient information to code with codable children: Secondary | ICD-10-CM

## 2016-12-20 DIAGNOSIS — Z794 Long term (current) use of insulin: Secondary | ICD-10-CM

## 2016-12-20 DIAGNOSIS — E1165 Type 2 diabetes mellitus with hyperglycemia: Secondary | ICD-10-CM

## 2016-12-20 DIAGNOSIS — E785 Hyperlipidemia, unspecified: Secondary | ICD-10-CM

## 2016-12-20 DIAGNOSIS — E669 Obesity, unspecified: Secondary | ICD-10-CM

## 2016-12-20 DIAGNOSIS — E1159 Type 2 diabetes mellitus with other circulatory complications: Secondary | ICD-10-CM

## 2016-12-20 NOTE — Progress Notes (Signed)
Subjective:      Date: 12/20/2016 7:13 AM   Patient ID: Emily Rowe is a 66 y.o. female.    Chief Complaint:  No chief complaint on file.      HPI  Visit type: follow-up - type II DM   Diagnosed: 5 years ago  Type: Insulin requiring  Complications: hyperglycemia  Control: inadequate control  Comorbid illness: hyperlipidemia  Last follow-up: 6 weeks ago with Dr. Maeola Harman    Diabetes Medication Regimen:Glipizide XL 20 mg daily, Tradjenta 5 mg daily, Lantus 32 units daily  Unable to tolerate Metformin due to diarrhea, unable to tolerate Victoza due to n/v, unable to tolerate Invokana   Medication effectiveness/adherence: working well  Medication side effects: none  Interval events: no interval events  Interval symptoms: none  Patient denies: no other symptoms including chest pain, shortness of breath, hypoglycemic episodes, polydipsia, polyuria, headaches, dizziness, or abdominal pain  Home glucose readings: fasting 96 to 111; 2 hours post prandial 102 to 180; no BS lower than 70  Daily basal insulin dosing: Lantus  Daily bolus insulin dosing: non-insulin requiring  Diabetic screening: 2 months ago  Podiatric assessment: admits LE burning/tingling due to hx of stroke   Exercise: walks occasionally   Diet: inconsistent compliance with carb controlled diet   Response to therapy: with hemoglobin A1C above goal  A1c trend: improving     Lab Results   Component Value Date    HGBA1C 8.2 (A) 11/08/2016    HGBA1C 9.4 (H) 07/28/2016    HGBA1C 8.1 (H) 11/25/2010     Trend is decreasing  Additional concerns: In addition, pt has hyperlipidemia and is compliant with lipid therapy.  Pt denies side effects of lipid therapy - specifically denies myalgia.     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     Review of Systems     Constitutional: negative   HENT:  negative   Eyes:  negative   Respiratory:  negative   Cardiovascular:  negative   Gastrointestinal:  negative    Genitourinary:  negative   Musculoskeletal:  negative   Skin:  negative   Neurological: numbness, tingling, burning on LE  Endo/Heme/Allergies:  negative   Psychiatric/Behavioral:  negative     Problem List:  Patient Active Problem List   Diagnosis   . Uncontrolled type 2 diabetes mellitus with hyperglycemia, with long-term current use of insulin   . Mixed hyperlipidemia   . Benign hypertension   . Long-term insulin use       Current Medications:  Current Outpatient Prescriptions   Medication Sig Dispense Refill   . glipiZIDE XL (GLUCOTROL XL) 5 MG 24 hr tablet TAKE 2 TABLET BY MOUTH IN THE MORNING (Patient taking differently: take one tablet by mouth in the afternoon) 180 tablet 0   . glipiZIDE XL (GLUCOTROL) 10 MG 24 hr tablet Take 2 tabs by mouth daily with breakfast 180 tablet 1   . insulin glargine (LANTUS SOLOSTAR) 100 UNIT/ML injection pen INJECT 32 UNITS INTO SKIN ONCE DAILY 30 mL 1   . Insulin Pen Needle (PEN NEEDLES) 32G X 4 MM Misc 1 Unit by Does not apply route daily. 100 each 1   . Linagliptin (TRADJENTA) 5 MG Tab Take 1 tablet (5 mg total) by mouth daily. 90 tablet 1   . NON FORMULARY Levanlodipino besilato 2.5     . NON FORMULARY Diupress 2.5 mg daily     . simvastatin (ZOCOR)  40 MG tablet Take 20 mg by mouth nightly.       No current facility-administered medications for this visit.        Allergies:  Allergies   Allergen Reactions   . Codeine    . Glucosamine    . Latex        Past Medical History:  Past Medical History:   Diagnosis Date   . Diabetes    . Hyperlipemia    . Hypertensive disorder    . Neuropathy    . Stroke    . TIA (transient ischemic attack)        Past Surgical History:  Past Surgical History:   Procedure Laterality Date   . HYSTERECTOMY     . REDUCTION MAMMAPLASTY     . TONSILLECTOMY     . urinary bladder         Family History:  Family History   Problem Relation Age of Onset   . Diabetes Mother    . Diabetes Maternal Grandmother        Social History:  Social History     Social  History   . Marital status: Married     Spouse name: N/A   . Number of children: N/A   . Years of education: N/A     Occupational History   . Not on file.     Social History Main Topics   . Smoking status: Never Smoker   . Smokeless tobacco: Never Used   . Alcohol use No   . Drug use: No   . Sexual activity: Not on file     Other Topics Concern   . Not on file     Social History Narrative   . No narrative on file       The following sections were reviewed this encounter by the provider:   Tobacco  Allergies  Meds  Med Hx  Surg Hx  Fam Hx  Soc Hx          Vitals:  BP 116/73   Pulse 80   Temp 97.8 F (36.6 C) (Oral)   Resp 18   Ht 1.651 m (5\' 5" )   Wt 96.4 kg (212 lb 9.6 oz)   SpO2 97%   BMI 35.38 kg/m      Physical Examination     General appearance: alert, appears stated age, cooperative and no distress  Eyes: conjunctivae/corneas clear. PERRL, EOM's intact.   Neck: no cervical lymphadenopathy, no carotid bruit,  supple, symmetrical, trachea midline   Thyroid:normal sized on inspection; non tender to palpation and no discrete nodules palpated  Lungs: clear to auscultation bilaterally  Heart: regular rate and rhythm, S1, S2 normal, no murmur, click, rub or gallop  Abdomen: soft, non-tender; bowel sounds normal; no masses,  no organomegaly  Extremities: extremities normal, atraumatic, no cyanosis or edema  Foot Exam: monofilament testing of plantar aspect of first toe and metatarsal joints (10g monofilament) intact bilaterally, no fissures, maceration, ulceration or lesions bilaterally, normal hair growth bilaterally, normal pigmentation bilaterally and vibratory perception (128 Hz tuning fork) applied to first toe intact bilaterally; Pedal pulses 2+ bilaterally  Skin: Skin color, texture, turgor normal. No rashes or lesions  Neurologic: Alert and oriented X 3, normal strength and tone. Normal symmetric reflexes. No tremor    Assessment and Plan     1. Uncontrolled type 2 diabetes mellitus with  hyperglycemia, with long-term current use of insulin  - since last visit 6 weeks ago,  fasting and post prandial BS have been at goal   - continue Glipizide XL 20 mg daily, Tradjenta 5 mg daily and Lantus 32 units daily   - no hypoglycemia on increased Glipizide dose       2. Mixed hyperlipidemia  - LDL not at goal; recently resumed statins by PCP  - fasting Lipid panel at next visit       3. Hx of CVA       - stable; BP at goal; check Lipid panel at next visit       4. Obesity        - recommend calorie restricted diet and optimize carb controlled diet       - increase physical activity as tolerated     Bynum Bellows MSN FNP-BC  [facility]

## 2017-01-25 ENCOUNTER — Encounter (INDEPENDENT_AMBULATORY_CARE_PROVIDER_SITE_OTHER): Payer: Self-pay | Admitting: Internal Medicine

## 2017-01-30 ENCOUNTER — Telehealth (INDEPENDENT_AMBULATORY_CARE_PROVIDER_SITE_OTHER): Payer: Self-pay

## 2017-01-30 MED ORDER — INSULIN DEGLUDEC 200 UNIT/ML SC SOPN
32.0000 [IU] | PEN_INJECTOR | Freq: Every day | SUBCUTANEOUS | 1 refills | Status: DC
Start: 2017-01-30 — End: 2017-06-22

## 2017-01-30 NOTE — Telephone Encounter (Signed)
Please call in Rx for same dose of insulin as the patient is on (please confirm with her on the long acting insulin dose) and switch to tresiba 200 units/ml. Thanks!

## 2017-01-30 NOTE — Telephone Encounter (Signed)
[facility] pharmacy sent a request for refill but a new drug Rx has be sent for either Levemir, Basaglar or [place] .

## 2017-02-01 ENCOUNTER — Other Ambulatory Visit (FREE_STANDING_LABORATORY_FACILITY): Payer: Medicare Other

## 2017-02-01 ENCOUNTER — Other Ambulatory Visit (INDEPENDENT_AMBULATORY_CARE_PROVIDER_SITE_OTHER): Payer: Self-pay

## 2017-02-01 ENCOUNTER — Other Ambulatory Visit: Payer: Medicare Other

## 2017-02-01 DIAGNOSIS — E118 Type 2 diabetes mellitus with unspecified complications: Secondary | ICD-10-CM

## 2017-02-01 DIAGNOSIS — E010 Iodine-deficiency related diffuse (endemic) goiter: Secondary | ICD-10-CM

## 2017-02-01 DIAGNOSIS — E1165 Type 2 diabetes mellitus with hyperglycemia: Secondary | ICD-10-CM

## 2017-02-01 LAB — COMPREHENSIVE METABOLIC PANEL
ALT: 17 U/L (ref 0–55)
AST (SGOT): 20 U/L (ref 5–34)
Albumin/Globulin Ratio: 1.1 (ref 0.9–2.2)
Albumin: 3.7 g/dL (ref 3.5–5.0)
Alkaline Phosphatase: 82 U/L (ref 37–106)
BUN: 17 mg/dL (ref 7.0–19.0)
Bilirubin, Total: 0.4 mg/dL (ref 0.1–1.2)
CO2: 30 mEq/L — ABNORMAL HIGH (ref 21–29)
Calcium: 9.4 mg/dL (ref 8.5–10.5)
Chloride: 101 mEq/L (ref 100–111)
Creatinine: 0.8 mg/dL (ref 0.4–1.5)
Globulin: 3.5 g/dL (ref 2.0–3.7)
Glucose: 128 mg/dL — ABNORMAL HIGH (ref 70–100)
Potassium: 3.5 mEq/L (ref 3.5–5.1)
Protein, Total: 7.2 g/dL (ref 6.0–8.3)
Sodium: 139 mEq/L (ref 136–145)

## 2017-02-01 LAB — MICROALBUMIN, RANDOM URINE
Urine Creatinine, Random: 102.3 mg/dL
Urine Microalbumin, Random: 6 (ref 0.0–30.0)
Urine Microalbumin/Creatinine Ratio: 6 ug/mg (ref 0–30)

## 2017-02-01 LAB — LIPID PANEL
Cholesterol / HDL Ratio: 5.9
Cholesterol: 255 mg/dL — ABNORMAL HIGH (ref 0–199)
HDL: 43 mg/dL (ref 40–9999)
LDL Calculated: 183 mg/dL — ABNORMAL HIGH (ref 0–99)
Triglycerides: 145 mg/dL (ref 34–149)
VLDL Calculated: 29 mg/dL (ref 10–40)

## 2017-02-01 LAB — GFR: EGFR: >60

## 2017-02-01 LAB — HEMOLYSIS INDEX: Hemolysis Index: 4 (ref 0–18)

## 2017-02-01 LAB — TSH: TSH: 1.46 u[IU]/mL (ref 0.35–4.94)

## 2017-02-02 LAB — HEMOGLOBIN A1C
Average Estimated Glucose: 182.9 mg/dL
Hemoglobin A1C: 8 % — ABNORMAL HIGH (ref 4.6–5.9)

## 2017-02-08 ENCOUNTER — Encounter (INDEPENDENT_AMBULATORY_CARE_PROVIDER_SITE_OTHER): Payer: Self-pay | Admitting: Internal Medicine

## 2017-02-08 ENCOUNTER — Ambulatory Visit (INDEPENDENT_AMBULATORY_CARE_PROVIDER_SITE_OTHER): Payer: Medicare Other | Admitting: Internal Medicine

## 2017-02-08 VITALS — BP 126/83 | HR 84 | Resp 16 | Ht 65.0 in | Wt 214.0 lb

## 2017-02-08 DIAGNOSIS — E782 Mixed hyperlipidemia: Secondary | ICD-10-CM

## 2017-02-08 DIAGNOSIS — Z794 Long term (current) use of insulin: Secondary | ICD-10-CM

## 2017-02-08 DIAGNOSIS — E669 Obesity, unspecified: Secondary | ICD-10-CM | POA: Insufficient documentation

## 2017-02-08 DIAGNOSIS — E1165 Type 2 diabetes mellitus with hyperglycemia: Secondary | ICD-10-CM

## 2017-02-08 DIAGNOSIS — Z8673 Personal history of transient ischemic attack (TIA), and cerebral infarction without residual deficits: Secondary | ICD-10-CM

## 2017-02-08 DIAGNOSIS — Z6835 Body mass index (BMI) 35.0-35.9, adult: Secondary | ICD-10-CM

## 2017-02-08 MED ORDER — ROSUVASTATIN CALCIUM 20 MG PO TABS
20.0000 mg | ORAL_TABLET | Freq: Every day | ORAL | 1 refills | Status: DC
Start: 2017-02-08 — End: 2017-11-22

## 2017-02-08 NOTE — Progress Notes (Signed)
Subjective:      Date: 02/08/2017 9:44 AM   Patient ID: Emily Rowe is a 66 y.o. female.    Chief Complaint:  Chief Complaint   Patient presents with   . Uncontrolled type 2 diabetes mellitus with hyperglycemia, un       HPI  Visit type: follow-up - type II DM   Diagnosed: 5 years ago  Type: Insulin requiring  Complications: hyperglycemia  Control: inadequate control  Comorbid illness: hyperlipidemia  Last follow-up: 6 weeks ago with Dr. Maeola Harman  Diabetes Medication Regimen: Glipizide XL 20 mg daily, Tradjenta 5 mg daily, Lantus 32 units daily; Has not started [place] yet   Unable to tolerate Metformin due to diarrhea, unable to tolerate Victoza due to n/v, unable to tolerate Invokana   Medication effectiveness/adherence: working well  Medication side effects: none  Interval events: no interval events  Interval symptoms: none  Patient denies: no other symptoms including chest pain, shortness of breath, hypoglycemic episodes, polydipsia, polyuria, headaches, dizziness, or abdominal pain  Home glucose readings: fasting 96 to 111; 2 hours post prandial 102 to 180; no BS lower than 70  Daily basal insulin dosing: Lantus  Daily bolus insulin dosing: non-insulin requiring  Diabetic screening: 2 months ago  Podiatric assessment: admits LE burning/tingling due to hx of stroke   Exercise: walks occasionally   Diet: inconsistent compliance with carb controlled diet   Response to therapy: with hemoglobin A1C above goal  A1c trend: improving     Lab Results   Component Value Date    HGBA1C 8.0 (H) 02/01/2017    HGBA1C 8.2 (A) 11/08/2016    HGBA1C 9.4 (H) 07/28/2016     Trend is decreasing  Additional concerns: In addition, pt has hyperlipidemia and is compliant with lipid therapy.  Pt denies side effects of lipid therapy - specifically denies myalgia.     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     Review of Systems     Constitutional: negative   HENT:  negative   Eyes:   negative   Respiratory:  negative   Cardiovascular:  negative   Gastrointestinal:  negative   Genitourinary:  negative   Musculoskeletal:  negative   Skin:  negative   Neurological: numbness, tingling, burning on LE  Endo/Heme/Allergies:  negative   Psychiatric/Behavioral:  negative     Problem List:  Patient Active Problem List   Diagnosis   . Uncontrolled type 2 diabetes mellitus with hyperglycemia, with long-term current use of insulin   . Mixed hyperlipidemia   . Benign hypertension   . Long-term insulin use       Current Medications:  Current Outpatient Prescriptions   Medication Sig Dispense Refill   . glipiZIDE XL (GLUCOTROL) 10 MG 24 hr tablet Take 2 tabs by mouth daily with breakfast 180 tablet 1   . Insulin Degludec (TRESIBA FLEXTOUCH) 200 UNIT/ML Solution Pen-injector Inject 32 Units into the skin daily. 18 mL 1   . Insulin Pen Needle (PEN NEEDLES) 32G X 4 MM Misc 1 Unit by Does not apply route daily. 100 each 1   . Linagliptin (TRADJENTA) 5 MG Tab Take 1 tablet (5 mg total) by mouth daily. 90 tablet 1   . NON FORMULARY Levanlodipino besilato 2.5     . NON FORMULARY Diupress 2.5 mg daily     . simvastatin (ZOCOR) 40 MG tablet Take 20 mg by mouth nightly.  No current facility-administered medications for this visit.        Allergies:  Allergies   Allergen Reactions   . Codeine    . Glucosamine    . Latex        Past Medical History:  Past Medical History:   Diagnosis Date   . Diabetes    . Hyperlipemia    . Hypertensive disorder    . Neuropathy    . Stroke    . TIA (transient ischemic attack)        Past Surgical History:  Past Surgical History:   Procedure Laterality Date   . HYSTERECTOMY     . REDUCTION MAMMAPLASTY     . TONSILLECTOMY     . urinary bladder         Family History:  Family History   Problem Relation Age of Onset   . Diabetes Mother    . Diabetes Maternal Grandmother        Social History:  Social History     Social History   . Marital status: Married     Spouse name: N/A   . Number of  children: N/A   . Years of education: N/A     Occupational History   . Not on file.     Social History Main Topics   . Smoking status: Never Smoker   . Smokeless tobacco: Never Used   . Alcohol use No   . Drug use: No   . Sexual activity: Not on file     Other Topics Concern   . Not on file     Social History Narrative   . No narrative on file       The following sections were reviewed this encounter by the provider:       ROS   A complete 12 point ROS was obtained. Pertinent positives and negatives as noted in HPI. All other systems are negative.      Vitals:  BP 126/83   Pulse 84   Resp 16   Ht 1.651 m (5\' 5" )   Wt 97.1 kg (214 lb)   SpO2 98%   BMI 35.61 kg/m    Wt Readings from Last 3 Encounters:   02/08/17 97.1 kg (214 lb)   12/20/16 96.4 kg (212 lb 9.6 oz)   11/08/16 94.8 kg (209 lb)       Physical Examination     General appearance: alert, appears stated age, cooperative and no distress  Eyes: conjunctivae/corneas clear. PERRL, EOM's intact.   Neck: no cervical lymphadenopathy, no carotid bruit,  supple, symmetrical, trachea midline   Thyroid:normal sized on inspection; non tender to palpation and no discrete nodules palpated  Lungs: clear to auscultation bilaterally  Heart: regular rate and rhythm, S1, S2 normal, no murmur, click, rub or gallop  Abdomen: soft, non-tender; bowel sounds normal; no masses,  no organomegaly  Extremities: extremities normal, atraumatic, no cyanosis or edema  Foot Exam: monofilament testing of plantar aspect of first toe and metatarsal joints (10g monofilament) intact bilaterally, no fissures, maceration, ulceration or lesions bilaterally, normal hair growth bilaterally, normal pigmentation bilaterally and vibratory perception (128 Hz tuning fork) applied to first toe intact bilaterally; Pedal pulses 2+ bilaterally  Skin: Skin color, texture, turgor normal. No rashes or lesions  Neurologic: Alert and oriented X 3, normal strength and tone. Normal symmetric reflexes. No  tremor    Assessment and Plan     1. Uncontrolled type 2 diabetes mellitus with hyperglycemia,  with long-term current use of insulin  - since last visit 6 weeks ago, fasting and post prandial BS have been at goal   - continue Glipizide XL 20 mg daily, Tradjenta 5 mg daily and Lantus 32 units daily   - Significant interval improvement in glycemic control, without episodes of hypoglycemia.  Lab Results   Component Value Date    HGBA1C 8.0 (H) 02/01/2017    HGBA1C 8.2 (A) 11/08/2016    HGBA1C 9.4 (H) 07/28/2016       Current A1C is elevated above goal and trend has increased compared to prior values..  Recommend optimizing low carbohydrate diet efforts and drinking at least 60 ounces of H20 daily.  Exercise goal - 150 minutes of sustained cardio exercise per week. Recommend home glucometer testing twice a day  ( pre-breakfast or 2 hrs post-prandial).  Retinopathy surveillance is UTD. Podiatry surveillance is UTD. Microalbumin testing is UTD within past year.      2. Mixed hyperlipidemia  Lab Results   Component Value Date    CHOL 255 (H) 02/01/2017    CHOL 214 (H) 11/18/2010     Lab Results   Component Value Date    HDL 43 02/01/2017    HDL 44 11/18/2010     Lab Results   Component Value Date    LDL 183 (H) 02/01/2017    LDL 154 (H) 11/18/2010     Lab Results   Component Value Date    TRIG 145 02/01/2017    TRIG 80 11/18/2010     - LDL not at goal; discussed switching from zocor to crestor. Higher dose of zocor is associated with elevated liver tests.  - fasting Lipid panel at next visit       3. Hx of CVA       - stable; BP at goal; check Lipid panel at next visit       4. Obesity (BMI 35-35.9)       - recommend calorie restricted diet and optimize carb controlled diet       - increase physical activity as tolerated     RTC in 3 months    Tanya Nones MD MPH  Endocrinologist, [facility]

## 2017-03-16 ENCOUNTER — Other Ambulatory Visit (INDEPENDENT_AMBULATORY_CARE_PROVIDER_SITE_OTHER): Payer: Self-pay | Admitting: Internal Medicine

## 2017-03-16 ENCOUNTER — Encounter (HOSPITAL_BASED_OUTPATIENT_CLINIC_OR_DEPARTMENT_OTHER): Payer: Self-pay

## 2017-03-16 DIAGNOSIS — E1165 Type 2 diabetes mellitus with hyperglycemia: Secondary | ICD-10-CM

## 2017-03-16 NOTE — Progress Notes (Signed)
Individual Therapy Pre Screening Form  Date/Time: 03/16/2017, 1:38 PM  Interviewer: Karie Chimera    Patient Identification  Patient Name: Emily Rowe      Patient SSN: [id number]    Patient Phone Number(s):  [phone] (home)  , additional phone number:     Age:66 y.o.   DOB: 02-23-1951      Screening:   Can you briefly tell me about why you're seeking therapy?:lonely and anxious, has depression had a stroke 6 years ago ,       Do you currently drink alcohol? no  Do you currently or in the recent past use any non prescribed or illegal substances? No   (if yes to either, ask if they have ever had substance abuse treatment)  REFER TO [facility] IF APPROPRIATE    Are you involved in any legal trouble or lawsuits? No  REFER OUT    Have you ever had any hallucinations? (if yes ask if they are having them currently)  No  REFER OUT IF APPROPRIATE    Do you have any memory/cognition impairments? Yes, due to stroke sometimes forgets things and get dizzy,   REFER OUT IF APPROPRIATE      Have you ever had thoughts about suicide (if yes, ask how recent, if they had a plan, and if they have ever been hospitalized, if currently follow protocol)? No    (If yes to above question) Do you have any social support should the symptoms worsen? Kids, but they are far way, and she doesn't always understand the younger generation, tries to solve her problems by herself     Are you currently on any psychiatric medication? Yes  What are they? Amitriptyline, an antidepressant for her neuropathy     Who is the prescribing doctor? Foot Dr[facility] [place]     Comments/concerns from Clinical research associate: [place]

## 2017-03-17 ENCOUNTER — Ambulatory Visit: Payer: Medicare Other

## 2017-03-20 ENCOUNTER — Ambulatory Visit: Payer: Medicare Other

## 2017-03-22 ENCOUNTER — Ambulatory Visit: Payer: Medicare Other

## 2017-03-29 ENCOUNTER — Ambulatory Visit: Payer: Medicare Other

## 2017-03-31 ENCOUNTER — Ambulatory Visit: Payer: Medicare Other

## 2017-04-05 ENCOUNTER — Ambulatory Visit: Payer: Medicare Other

## 2017-04-07 ENCOUNTER — Ambulatory Visit: Payer: Medicare Other

## 2017-04-12 ENCOUNTER — Ambulatory Visit: Payer: Medicare Other

## 2017-04-14 ENCOUNTER — Ambulatory Visit: Payer: Medicare Other

## 2017-04-24 NOTE — Progress Notes (Signed)
Reviewed at follow up visit

## 2017-05-03 ENCOUNTER — Encounter (INDEPENDENT_AMBULATORY_CARE_PROVIDER_SITE_OTHER): Payer: Self-pay | Admitting: Internal Medicine

## 2017-05-03 ENCOUNTER — Ambulatory Visit (INDEPENDENT_AMBULATORY_CARE_PROVIDER_SITE_OTHER): Payer: Medicare Other | Admitting: Internal Medicine

## 2017-05-03 VITALS — BP 120/77 | HR 91 | Resp 16 | Ht 65.0 in | Wt 208.0 lb

## 2017-05-03 DIAGNOSIS — R300 Dysuria: Secondary | ICD-10-CM

## 2017-05-03 DIAGNOSIS — I10 Essential (primary) hypertension: Secondary | ICD-10-CM

## 2017-05-03 DIAGNOSIS — E782 Mixed hyperlipidemia: Secondary | ICD-10-CM

## 2017-05-03 DIAGNOSIS — Z794 Long term (current) use of insulin: Secondary | ICD-10-CM

## 2017-05-03 DIAGNOSIS — E1165 Type 2 diabetes mellitus with hyperglycemia: Secondary | ICD-10-CM

## 2017-05-03 DIAGNOSIS — Z6834 Body mass index (BMI) 34.0-34.9, adult: Secondary | ICD-10-CM

## 2017-05-03 DIAGNOSIS — E669 Obesity, unspecified: Secondary | ICD-10-CM

## 2017-05-03 LAB — COMPREHENSIVE METABOLIC PANEL
ALT: 24 U/L (ref 0–55)
AST (SGOT): 24 U/L (ref 5–34)
Albumin/Globulin Ratio: 1.2 (ref 0.9–2.2)
Albumin: 3.8 g/dL (ref 3.5–5.0)
Alkaline Phosphatase: 91 U/L (ref 37–106)
BUN: 18 mg/dL (ref 7.0–19.0)
Bilirubin, Total: 0.2 mg/dL (ref 0.1–1.2)
CO2: 30 mEq/L — ABNORMAL HIGH (ref 21–29)
Calcium: 10.2 mg/dL (ref 8.5–10.5)
Chloride: 101 mEq/L (ref 100–111)
Creatinine: 0.8 mg/dL (ref 0.4–1.5)
Globulin: 3.3 g/dL (ref 2.0–3.7)
Glucose: 148 mg/dL — ABNORMAL HIGH (ref 70–100)
Potassium: 3.3 mEq/L — ABNORMAL LOW (ref 3.5–5.1)
Protein, Total: 7.1 g/dL (ref 6.0–8.3)
Sodium: 141 mEq/L (ref 136–145)

## 2017-05-03 LAB — GFR: EGFR: >60

## 2017-05-03 LAB — HEMOLYSIS INDEX: Hemolysis Index: 8 (ref 0–18)

## 2017-05-03 NOTE — Patient Instructions (Signed)
Please contact our office if you have not heard from our staff regarding your results in 2 weeks.

## 2017-05-03 NOTE — Progress Notes (Signed)
Subjective:      Date: 05/03/2017 2:07 PM   Patient ID: Emily Rowe is a 66 y.o. female.    Chief Complaint:  Chief Complaint   Patient presents with   . Uncontrolled type 2 diabetes mellitus with hyperglycemia, wi       HPI  Visit type: follow-up - type II DM   Diagnosed: 5 years ago  Type: Insulin requiring  Complications: hyperglycemia  Control: inadequate control  Comorbid illness: hyperlipidemia  Last follow-up: 3 months ago  Diabetes Medication Regimen: Glipizide XL 20 mg daily, Tradjenta 5 mg daily, Tresiba 32 units daily.  Unable to tolerate Metformin due to diarrhea, unable to tolerate Victoza due to n/v, unable to tolerate Invokana   Medication effectiveness/adherence: working well  Medication side effects: none  Interval events: no interval events  Interval symptoms: Urinary retention , flank pain   Patient denies: no other symptoms including chest pain, shortness of breath, hypoglycemic episodes, polydipsia, polyuria, headaches, dizziness, or abdominal pain  Home glucose readings: fasting 96 to 111; 2 hours post prandial 102 to 180; no BG readings are lower than 70  Daily basal insulin dosing: Tresiba  Daily bolus insulin dosing: non-insulin requiring  Diabetic screening: 2 months ago  Podiatric assessment: admits LE burning/tingling due to hx of stroke   Exercise: walks occasionally   Diet: inconsistent compliance with carb controlled diet   Response to therapy: with hemoglobin A1C above goal  A1c trend: improving     Lab Results   Component Value Date    HGBA1C 8.0 (H) 02/01/2017    HGBA1C 8.2 (A) 11/08/2016    HGBA1C 9.4 (H) 07/28/2016     Trend is decreasing  Additional concerns: In addition, pt has hyperlipidemia and is compliant with lipid therapy.  Pt denies side effects of lipid therapy - specifically denies myalgia.     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     Review of Systems     ROS: A complete 12 point ROS was obtained. Pertinent  positives and negatives as noted in HPI. All other systems are negative.      Problem List:  Patient Active Problem List   Diagnosis   . Uncontrolled type 2 diabetes mellitus with hyperglycemia, with long-term current use of insulin   . Mixed hyperlipidemia   . Benign hypertension   . Long-term insulin use   . Obesity (BMI 30-39.9)       Current Medications:  Current Outpatient Prescriptions   Medication Sig Dispense Refill   . glipiZIDE XL (GLUCOTROL) 10 MG 24 hr tablet Take 2 tabs by mouth daily with breakfast 180 tablet 1   . Insulin Degludec (TRESIBA FLEXTOUCH) 200 UNIT/ML Solution Pen-injector Inject 32 Units into the skin daily. 18 mL 1   . Insulin Pen Needle (PEN NEEDLES) 32G X 4 MM Misc 1 Unit by Does not apply route daily. 100 each 1   . NON FORMULARY Levanlodipino besilato 2.5     . NON FORMULARY Diupress 2.5 mg daily     . rosuvastatin (CRESTOR) 20 MG tablet Take 1 tablet (20 mg total) by mouth daily. 90 tablet 1   . TRADJENTA 5 MG Tab TAKE 1 TABLET BY MOUTH ONCE DAILY 90 tablet 1     No current facility-administered medications for this visit.        Allergies:  Allergies   Allergen Reactions   . Codeine    . Glucosamine    .  Latex        Past Medical History:  Past Medical History:   Diagnosis Date   . Diabetes    . Hyperlipemia    . Hypertensive disorder    . Neuropathy    . Stroke    . TIA (transient ischemic attack)        Past Surgical History:  Past Surgical History:   Procedure Laterality Date   . HYSTERECTOMY     . REDUCTION MAMMAPLASTY     . TONSILLECTOMY     . urinary bladder         Family History:  Family History   Problem Relation Age of Onset   . Diabetes Mother    . Diabetes Maternal Grandmother        Social History:  Social History     Social History   . Marital status: Married     Spouse name: N/A   . Number of children: N/A   . Years of education: N/A     Occupational History   . Not on file.     Social History Main Topics   . Smoking status: Never Smoker   . Smokeless tobacco: Never  Used   . Alcohol use No   . Drug use: No   . Sexual activity: Not on file     Other Topics Concern   . Not on file     Social History Narrative   . No narrative on file       The following sections were reviewed this encounter by the provider:       ROS   A complete 12 point ROS was obtained. Pertinent positives and negatives as noted in HPI. All other systems are negative.      Vitals:  BP 120/77   Pulse 91   Resp 16   Ht 1.651 m (5\' 5" )   Wt 94.3 kg (208 lb)   SpO2 96%   BMI 34.61 kg/m    Wt Readings from Last 3 Encounters:   05/03/17 94.3 kg (208 lb)   02/08/17 97.1 kg (214 lb)   12/20/16 96.4 kg (212 lb 9.6 oz)       Physical Examination     General appearance: alert, appears stated age, cooperative and no distress  Eyes: conjunctivae/corneas clear. PERRL, EOM's intact.   Neck: no cervical lymphadenopathy, no carotid bruit,  supple, symmetrical, trachea midline   Thyroid:normal sized on inspection; non tender to palpation and no discrete nodules palpated  Lungs: clear to auscultation bilaterally  Heart: regular rate and rhythm, S1, S2 normal, no murmur, click, rub or gallop  Abdomen: soft, non-tender; bowel sounds normal; no masses,  no organomegaly  Extremities: extremities normal, atraumatic, no cyanosis or edema  Foot Exam: monofilament testing of plantar aspect of first toe and metatarsal joints (10g monofilament) intact bilaterally, no fissures, maceration, ulceration or lesions bilaterally, normal hair growth bilaterally, normal pigmentation bilaterally and vibratory perception (128 Hz tuning fork) applied to first toe intact bilaterally; Pedal pulses 2+ bilaterally  Skin: Skin color, texture, turgor normal. No rashes or lesions  Neurologic: Alert and oriented X 3, normal strength and tone. Normal symmetric reflexes. No tremor    Assessment and Plan     1. Uncontrolled type 2 diabetes mellitus with hyperglycemia, with long-term current use of insulin  - since last visit 6 weeks ago, fasting and post  prandial BS have been at goal   - continue Glipizide XL 20 mg daily, Tradjenta 5  mg daily and tresiba 32 units daily   - Significant interval improvement in glycemic control, without episodes of hypoglycemia.  Lab Results   Component Value Date    HGBA1C 8.0 (H) 02/01/2017    HGBA1C 8.2 (A) 11/08/2016    HGBA1C 9.4 (H) 07/28/2016       Current A1C is elevated above goal and trend has increased compared to prior values..  Recommend optimizing low carbohydrate diet efforts and drinking at least 60 ounces of H20 daily.  Exercise goal - 150 minutes of sustained cardio exercise per week. Recommend home glucometer testing twice a day  ( pre-breakfast or 2 hrs post-prandial).  Retinopathy surveillance is UTD. Podiatry surveillance is UTD. Microalbumin testing is UTD within past year.      2. Mixed hyperlipidemia  Lab Results   Component Value Date    CHOL 255 (H) 02/01/2017    CHOL 214 (H) 11/18/2010     Lab Results   Component Value Date    HDL 43 02/01/2017    HDL 44 11/18/2010     Lab Results   Component Value Date    LDL 183 (H) 02/01/2017    LDL 154 (H) 11/18/2010     Lab Results   Component Value Date    TRIG 145 02/01/2017    TRIG 80 11/18/2010     - LDL not at goal; discussed switching from zocor to crestor. Higher dose of zocor is associated with elevated liver tests.  - fasting Lipid panel at next visit       3. Hx of CVA       - stable; BP at goal; check Lipid panel at next visit       4. Obesity (BMI 34-34.9)       - recommend calorie restricted diet and optimize carb controlled diet       - increase physical activity as tolerated     5. Dysuria  Will check UA to rule out urinary tract infection.    RTC in 3 months    Tanya Nones MD MPH  Endocrinologist, [facility]

## 2017-05-04 LAB — URINALYSIS
Bilirubin, UA: NEGATIVE
Blood, UA: NEGATIVE
Glucose, UA: NEGATIVE
Ketones UA: NEGATIVE
Leukocyte Esterase, UA: NEGATIVE
Nitrite, UA: NEGATIVE
Protein, UR: NEGATIVE
Specific Gravity UA: 1.018 (ref 1.001–1.035)
Urine pH: 7.5 (ref 5.0–8.0)
Urobilinogen, UA: 0.2

## 2017-05-04 LAB — HEMOGLOBIN A1C
Average Estimated Glucose: 177.2 mg/dL
Hemoglobin A1C: 7.8 % — ABNORMAL HIGH (ref 4.6–5.9)

## 2017-05-04 LAB — TSH: TSH: 0.93 u[IU]/mL (ref 0.35–4.94)

## 2017-05-10 ENCOUNTER — Ambulatory Visit (INDEPENDENT_AMBULATORY_CARE_PROVIDER_SITE_OTHER): Payer: Medicare Other | Admitting: Internal Medicine

## 2017-05-11 ENCOUNTER — Telehealth (INDEPENDENT_AMBULATORY_CARE_PROVIDER_SITE_OTHER): Payer: Self-pay

## 2017-05-11 NOTE — Telephone Encounter (Signed)
Patient called with concerns of her urinalysis results. She states that she's had symptoms of an UTI for 16-17 days in total.  Last seen on 05/03/17, patient states that she was told a medication would be called in for her. Please advise.

## 2017-05-11 NOTE — Progress Notes (Signed)
Lab results are in the normal range other than the following:  1. A1c/ 3 month estimate of avg sugars is in the range of uncontrolled diabetes; target A1c is less than 7%.  2. Urine is cloudy in appearance but there is no evidence of white cells or other signs of urinary tract infection.  3. Potassium level is slightly low; recommend increasing the intake of potassium rich foods- bananas, avocados, sweet potato etc.

## 2017-05-11 NOTE — Telephone Encounter (Signed)
Please call patient with the results- negative for UTI. Thanks!

## 2017-05-12 NOTE — Telephone Encounter (Signed)
Left msg to call the office for results

## 2017-05-12 NOTE — Telephone Encounter (Signed)
Patient aware of results and recommendations and verbalized understanding

## 2017-05-16 ENCOUNTER — Other Ambulatory Visit (INDEPENDENT_AMBULATORY_CARE_PROVIDER_SITE_OTHER): Payer: Self-pay

## 2017-05-16 DIAGNOSIS — E1165 Type 2 diabetes mellitus with hyperglycemia: Secondary | ICD-10-CM

## 2017-05-16 MED ORDER — GLIPIZIDE ER 10 MG PO TB24
ORAL_TABLET | ORAL | 1 refills | Status: DC
Start: 2017-05-16 — End: 2017-11-25

## 2017-05-16 NOTE — Telephone Encounter (Signed)
lv 05/03/17

## 2017-05-17 ENCOUNTER — Encounter (INDEPENDENT_AMBULATORY_CARE_PROVIDER_SITE_OTHER): Payer: Self-pay

## 2017-05-24 ENCOUNTER — Emergency Department: Payer: Medicare Other

## 2017-05-24 ENCOUNTER — Emergency Department
Admission: EM | Admit: 2017-05-24 | Discharge: 2017-05-24 | Disposition: A | Discharge status: Home / Self Care | Payer: Medicare Other | Attending: Emergency Medicine | Admitting: Emergency Medicine

## 2017-05-24 DIAGNOSIS — I10 Essential (primary) hypertension: Secondary | ICD-10-CM | POA: Insufficient documentation

## 2017-05-24 DIAGNOSIS — Z794 Long term (current) use of insulin: Secondary | ICD-10-CM | POA: Insufficient documentation

## 2017-05-24 DIAGNOSIS — R51 Headache: Secondary | ICD-10-CM | POA: Insufficient documentation

## 2017-05-24 DIAGNOSIS — R002 Palpitations: Secondary | ICD-10-CM | POA: Insufficient documentation

## 2017-05-24 DIAGNOSIS — Z8673 Personal history of transient ischemic attack (TIA), and cerebral infarction without residual deficits: Secondary | ICD-10-CM | POA: Insufficient documentation

## 2017-05-24 DIAGNOSIS — Z79899 Other long term (current) drug therapy: Secondary | ICD-10-CM | POA: Insufficient documentation

## 2017-05-24 DIAGNOSIS — R519 Headache, unspecified: Secondary | ICD-10-CM

## 2017-05-24 DIAGNOSIS — E785 Hyperlipidemia, unspecified: Secondary | ICD-10-CM | POA: Insufficient documentation

## 2017-05-24 DIAGNOSIS — E119 Type 2 diabetes mellitus without complications: Secondary | ICD-10-CM | POA: Insufficient documentation

## 2017-05-24 LAB — CBC AND DIFFERENTIAL
Absolute NRBC: 0 10*3/uL
Basophils Absolute Automated: 0.03 10*3/uL (ref 0.00–0.20)
Basophils Automated: 0.3 %
Eosinophils Absolute Automated: 0.01 10*3/uL (ref 0.00–0.70)
Eosinophils Automated: 0.1 %
Hematocrit: 38.3 % (ref 37.0–47.0)
Hgb: 12.4 g/dL (ref 12.0–16.0)
Immature Granulocytes Absolute: 0.06 10*3/uL — ABNORMAL HIGH
Immature Granulocytes: 0.6 %
Lymphocytes Absolute Automated: 1.97 10*3/uL (ref 0.50–4.40)
Lymphocytes Automated: 18.6 %
MCH: 27.3 pg — ABNORMAL LOW (ref 28.0–32.0)
MCHC: 32.4 g/dL (ref 32.0–36.0)
MCV: 84.2 fL (ref 80.0–100.0)
MPV: 14 fL — ABNORMAL HIGH (ref 9.4–12.3)
Monocytes Absolute Automated: 0.43 10*3/uL (ref 0.00–1.20)
Monocytes: 4.1 %
Neutrophils Absolute: 8.07 10*3/uL (ref 1.80–8.10)
Neutrophils: 76.3 %
Nucleated RBC: 0 /100 WBC (ref 0.0–1.0)
Platelets: 170 10*3/uL (ref 140–400)
RBC: 4.55 10*6/uL (ref 4.20–5.40)
RDW: 15 % (ref 12–15)
WBC: 10.57 10*3/uL (ref 3.50–10.80)

## 2017-05-24 LAB — GFR: EGFR: 45.3

## 2017-05-24 LAB — BASIC METABOLIC PANEL
Anion Gap: 13 (ref 5.0–15.0)
BUN: 18 mg/dL (ref 7.0–19.0)
CO2: 29 mEq/L (ref 22–29)
Calcium: 9.7 mg/dL (ref 8.5–10.5)
Chloride: 97 mEq/L — ABNORMAL LOW (ref 100–111)
Creatinine: 1.2 mg/dL — ABNORMAL HIGH (ref 0.6–1.0)
Glucose: 176 mg/dL — ABNORMAL HIGH (ref 70–100)
Potassium: 3.2 mEq/L — ABNORMAL LOW (ref 3.5–5.1)
Sodium: 139 mEq/L (ref 136–145)

## 2017-05-24 LAB — TROPONIN I: Troponin I: <0.01 ng/mL (ref 0.00–0.09)

## 2017-05-24 MED ORDER — SODIUM CHLORIDE 0.9 % IV BOLUS
500.0000 mL | Freq: Once | INTRAVENOUS | Status: AC
Start: 2017-05-24 — End: 2017-05-24
  Administered 2017-05-24: 500 mL via INTRAVENOUS

## 2017-05-24 MED ORDER — POTASSIUM CHLORIDE CRYS ER 20 MEQ PO TBCR
20.0000 meq | EXTENDED_RELEASE_TABLET | Freq: Once | ORAL | Status: AC
Start: 2017-05-24 — End: 2017-05-24
  Administered 2017-05-24: 20 meq via ORAL
  Filled 2017-05-24: qty 1

## 2017-05-24 NOTE — Discharge Instructions (Signed)
Hypertension    You have been diagnosed with elevated blood pressure.    The medical term for high blood pressure is hypertension. Many people feel anxious or uncomfortable about being at the hospital. If you feel anxious today, this could make your blood pressure appear high, even if your blood pressure is usually normal. Check your blood pressure several more times when you are not feeling stress. Keep a record of these readings and give this information to your regular doctor. He or she will decide whether you have hypertension that requires medical treatment.    If your blood pressure becomes extremely high all of a sudden, you will probably notice symptoms. In fact, very high blood pressure is a medical emergency. Most people with hypertension have blood pressure that is only a little too high. Mild high blood pressure does not cause specific symptoms. Instead, the effects of hypertension develop slowly over time. Untreated hypertension can affect the heart, brain, kidneys, eyes, and blood vessels. Unfortunately, by the time side-effects become noticeable, the body has already been damaged. This is why hypertension is called "the silent killer!"    It is important to follow up with your regular doctor. Check your blood pressure several times in the next 1 to 2 weeks and tell your doctor about the results. It may be helpful to keep a log or a journal where you can write down your blood pressures. Note the time of day and the activity you were doing when the reading was taken.    YOU SHOULD SEEK MEDICAL ATTENTION IMMEDIATELY, EITHER HERE OR AT THE NEAREST EMERGENCY DEPARTMENT, IF ANY OF THE FOLLOWING OCCURS:   You have a headache.   You have chest pain.   You are short of breath or have trouble breathing.    You feel weak, especially on only one side of the body.   Your symptoms get worse or you have other concerns.

## 2017-05-24 NOTE — ED Triage Notes (Signed)
C/o headache and dizziness all day today. Reports that she was running around doing errands this afternoon and when she checked her blood pressure and it was 287 systolic. Reports that she took both her BP medication and her diuretic 1.5 hours ago. Reports that her headache is a little bit better but she has had the dizziness ever since her stroke 1 year ago

## 2017-05-24 NOTE — ED Notes (Signed)
Patient refusing EKG and Repeat labs states she will follow up with PCP and cardiologist

## 2017-05-24 NOTE — ED Provider Notes (Signed)
EMERGENCY DEPARTMENT HISTORY AND PHYSICAL EXAM    Date Time: 05/25/17 2:31 AM  Patient Name: Emily Rowe, Emily Rowe, 66 y.o., female  ED Provider: C. Jaysean Manville, MD.    History of Presenting Illness:     Chief Complaint: Hypertension/HA/palpitations  History obtained from: Patient.  Narrative/Additional Historical Findings:Emily Rowe is a 66 y.o. female  Reports episode of elevated blood pressure and elevated heart rate about 2 hours ago, called the ambulance.  Took extra lovanlo (brazilian medication) and diupress and BP improved.  Sees Dr. Nedra Hai from [facility] who approved heart medications.  Denies chest pain with episode, does note some shortness of breath with the episode but she states she is always short of breath.  Reports she was dizzy but reports daily dizziness since stroke, sees a neurologist who states that is a late effect of the stroke.  Feels weak all the time as well.  Still has mild headache.  Denies vomiting. BS was 147 today.      Nursing notes from this date of service were reviewed.    Past Medical History:     Past Medical History:   Diagnosis Date   . Diabetes    . Hyperlipemia    . Hypertensive disorder    . Neuropathy    . Stroke    . TIA (transient ischemic attack)        Past Surgical History:     Past Surgical History:   Procedure Laterality Date   . HYSTERECTOMY     . REDUCTION MAMMAPLASTY     . TONSILLECTOMY     . urinary bladder         Family History:     Family History   Problem Relation Age of Onset   . Diabetes Mother    . Diabetes Maternal Grandmother        Social History:     Social History     Social History   . Marital status: Married     Spouse name: N/A   . Number of children: N/A   . Years of education: N/A     Social History Main Topics   . Smoking status: Never Smoker   . Smokeless tobacco: Never Used   . Alcohol use No   . Drug use: No   . Sexual activity: Not on file     Other Topics Concern   . Not on file     Social History Narrative   . No narrative on file        Allergies:     Allergies   Allergen Reactions   . Codeine    . Glucosamine    . Latex        Medications:   No current facility-administered medications for this encounter.     Current Outpatient Prescriptions:   .  glipiZIDE (GLUCOTROL) 10 MG 24 hr tablet, Take 2 tabs by mouth daily with breakfast, Disp: 180 tablet, Rfl: 1  .  Insulin Degludec (TRESIBA FLEXTOUCH) 200 UNIT/ML Solution Pen-injector, Inject 32 Units into the skin daily., Disp: 18 mL, Rfl: 1  .  Insulin Pen Needle (PEN NEEDLES) 32G X 4 MM Misc, 1 Unit by Does not apply route daily., Disp: 100 each, Rfl: 1  .  NON FORMULARY, Levanlodipino besilato 2.5, Disp: , Rfl:   .  NON FORMULARY, Diupress 2.5 mg daily, Disp: , Rfl:   .  rosuvastatin (CRESTOR) 20 MG tablet, Take 1 tablet (20 mg total) by mouth daily., Disp: 90 tablet,  Rfl: 1    Review of Systems:   Review of Systems   Constitutional: Negative for fever and activity change.   Eyes: Negative for visual disturbance.   Respiratory: Negative for chest tightness and shortness of breath.    Cardiovascular: Negative for chest pain.   Gastrointestinal: Negative for vomiting and abdominal pain.   Musculoskeletal: Negative for gait problem.   Skin: Negative for rash.   Allergic/Immunologic: Negative for immunocompromised state.   Neurological: Negative for weakness.   Psychiatric/Behavioral: Negative for suicidal ideas.     All other systems reviewed and are negative    Physical Exam:     ED Triage Vitals [05/24/17 2005]   Enc Vitals Group      BP 119/75      Heart Rate 80      Resp Rate 18      Temp 98.2 F (36.8 C)      Temp Source Oral      SpO2 97 %      Weight 92.1 kg      Height 1.651 m      Head Circumference       Peak Flow       Pain Score 6      Pain Loc       Pain Edu?       Excl. in GC?      Physical Exam   Constitutional: Oriented to person, place, and time. Appears well-developed. No distress.   Head: Normocephalic and atraumatic.   Eyes: Conjunctivae and EOM are normal.   Neck: Normal  range of motion. Neck supple.   Cardiovascular: Normal rate, regular rhythm and normal heart sounds.    Pulmonary/Chest: CTAB, Effort normal.   Abdominal: Soft. No distension.  Nontender  Musculoskeletal: Normal range of motion. No edema or tenderness.   Neurological: Alert and oriented to person, place, and time. No facial droop or slurred speech, tongue midline, PERRL/EOMI, 5/5 strength UE/LE on left, slightly reduced strength on right - per patient that is old from stroke, gait normal, No change from baseline neuro status  Skin: Skin is warm and dry. Not diaphoretic.   Psychiatric: Normal mood and affect.   Nursing note and vitals reviewed.    Labs:     Labs Reviewed   CBC AND DIFFERENTIAL - Abnormal; Notable for the following:        Result Value    MCH 27.3 (*)     MPV 14.0 (*)     Absolute Immature Granulocyte 0.06 (*)     All other components within normal limits   BASIC METABOLIC PANEL - Abnormal; Notable for the following:     Glucose 176 (*)     Creatinine 1.2 (*)     Potassium 3.2 (*)     Chloride 97 (*)     All other components within normal limits   TROPONIN I   GFR         Rads:     Radiology Results (24 Hour)     Procedure Component Value Units Date/Time    XR Chest 2 Views [[id number]] Collected:  05/24/17 2102    Order Status:  Completed Updated:  05/24/17 2106    Narrative:       Chest 2 views    CLINICAL INFORMATION: Chest pain.    FINDINGS:    The lungs are clear. The cardiomediastinal silhouette is within normal  limits. There are no effusions. Anterolateral osteophytes are noted  within the spine.  Impression:        No acute disease.    Rocky Crafts, MD   05/24/2017 9:02 PM          Medications   sodium chloride 0.9 % bolus 500 mL (500 mLs Intravenous New Bag 05/24/17 2109)   potassium chloride (K-DUR,KLOR-CON) CR tablet 20 mEq (20 mEq Oral Given 05/24/17 2347)       MDM and ED Course   C. Sapna Padron, M.D., is the primary attending for this patient and has obtained and performed the history,  PE, and medical decision making for this patient.    Oxygen saturation by pulse oximetry is 95%-100%, Normal.  Interventions: None Needed    ECG:  Rate: 79, Rhythm: nsr, Intervals: QTC 481, Interpretation: no ischemia  This ECG was interpreted by C. Mekiyah Gladwell, ED physician      MDM:  Emily Rowe refuses to stay for cardiac evaluation, will not agree to repeat troponin testing or ecg.  She states she feels much better and that she will go home now.  She states that sometimes her anxiety gets the best of her but she hates hospitals.  She will call her doctor tomorrow for a follow up.  She is well appearing, BP has improved and she is ambulatory in the ED without difficulty.  Advised her to return with any repeat symptoms.        Assessment/Plan:   Results and instructions reviewed at the bedside with patient and family.    Clinical Impression  Final diagnoses:   Essential hypertension   Nonintractable headache, unspecified chronicity pattern, unspecified headache type   Palpitations       Disposition  ED Disposition     ED Disposition Condition Date/Time Comment    Discharge  Wed May 24, 2017 11:23 PM Emily Rowe discharge to home/self care.    Condition at disposition: Stable          Prescriptions  Discharge Medication List as of 05/24/2017 11:23 PM              Signed by: C. Wilford Merryfield, MD       Mang Hazelrigg, Evalee Mutton, MD  05/25/17 [phone]

## 2017-05-25 LAB — ECG 12-LEAD
Atrial Rate: 79 {beats}/min
P Axis: 54 degrees
P-R Interval: 152 ms
Q-T Interval: 420 ms
QRS Duration: 78 ms
QTC Calculation (Bezet): 481 ms
R Axis: 64 degrees
T Axis: 16 degrees
Ventricular Rate: 79 {beats}/min

## 2017-05-29 ENCOUNTER — Telehealth (INDEPENDENT_AMBULATORY_CARE_PROVIDER_SITE_OTHER): Payer: Self-pay

## 2017-05-29 NOTE — Telephone Encounter (Signed)
ED VISIT FOLLOW UP CALL    Facility: [facility] ED   Discharge Date: 05/24/17  Primary discharge Dx: Essential hypertension  Secondary discharge Dx: Nonintractable headache, unspecified chronicity pattern, unspecified headache type; Palpitations  Follow up appointment with PCP: 05/31/17      Specialist: None        Questions asked:     -What symptoms made you go to the ED?     Patient went to ED for hypertension, headache, and dizziness. Patient said that the dizziness has been present since having a stroke, but it has been worse in the last few days. Patient has also been feeling very weak.    -How are you feeling today after your recent ED visit:     Patient says that she is about the same, headache has resolved, but was present yesterday, 05/28/17. Patient says that she still feels weak and has episodes of dizziness. Patient says that her blood pressure has improved and she has been monitoring it closely.    -Do you have any new or worsening symptoms since being seen?     Patient says that she did get SOB yesterday, 05/28/17, but it has seen resolved. Episode did resolve on its own, and patient has a history of breathing problems.  If so, describe your symptoms and what you have tried to relieve your symptoms.     Patient says that the SOB resolved on its own.    -What instructions did they give you at discharge?:     Patient was advised to follow up with PCP as soon as possible.    -Do you have your printed copy of your discharge instructions?: No    -What questions do you have regarding your instructions?     Patient did not have any questions regarding discharge and was transferred to schedule an appointment with PCP. Appointment was scheduled for 05/31/17.    -What new medications were prescribed at your visit or what changes were made to your medications?    None    -Were you able to obtain the medication?     N/A    -Do you have assistance at home and transportation to appointments?     No, patient does not require  assistance at this time.     Advised to follow up as instructed. Patient/family member verbalized understanding and has no other questions or concerns. Instructed to call back if symptoms change or worsen and/or go to the emergency room or call 911 for emergency symptoms.

## 2017-05-29 NOTE — Telephone Encounter (Signed)
Noted. Thanks Ms Efraim Kaufmann.

## 2017-05-31 ENCOUNTER — Encounter (INDEPENDENT_AMBULATORY_CARE_PROVIDER_SITE_OTHER): Payer: Self-pay | Admitting: Family Medicine

## 2017-05-31 ENCOUNTER — Ambulatory Visit (FREE_STANDING_LABORATORY_FACILITY): Payer: Medicare Other | Admitting: Family Medicine

## 2017-05-31 VITALS — BP 130/85 | HR 70 | Temp 98.1°F | Ht 65.0 in | Wt 211.0 lb

## 2017-05-31 DIAGNOSIS — E876 Hypokalemia: Secondary | ICD-10-CM

## 2017-05-31 DIAGNOSIS — I10 Essential (primary) hypertension: Secondary | ICD-10-CM

## 2017-05-31 DIAGNOSIS — E1165 Type 2 diabetes mellitus with hyperglycemia: Secondary | ICD-10-CM

## 2017-05-31 DIAGNOSIS — Z794 Long term (current) use of insulin: Secondary | ICD-10-CM

## 2017-05-31 DIAGNOSIS — E782 Mixed hyperlipidemia: Secondary | ICD-10-CM

## 2017-05-31 DIAGNOSIS — F418 Other specified anxiety disorders: Secondary | ICD-10-CM

## 2017-05-31 LAB — GFR: EGFR: >60

## 2017-05-31 LAB — HEMOLYSIS INDEX: Hemolysis Index: 13 (ref 0–18)

## 2017-05-31 NOTE — Progress Notes (Signed)
Subjective:      Date: 05/31/2017 10:11 PM   Patient ID: Emily Rowe is a 66 y.o. female.    Chief Complaint:  Chief Complaint   Patient presents with   . Hypertension   . Diabetes       HPI  66 yr old female in for fu   Was in ER recently  States felt headache, neck pain an bP was high 260's and so went to ER  She states she has been feeling anxiety with headache last few weeks  At end of day she Is getting anxious and nervous  States her daughter has moved in with a dog and she cannot do it  So has spoken with her daughter and she is moving out    PT was on celexa  Then she saw Dr Laurell Josephs and he gave her medication  No suicidal thoughts      Visit type: follow-up - type II DM  Type: Insulin requiring  Complications: peripheral vascular disease, diabetic peripheral neuropathy and hyperglycemia  Control: inadequate control  Comorbid illness: hyperlipidemia, hypertension, diabetes, obesity and OSA  Last follow-up: months ago and with the patient's primary care physician    Diabetes Medication Regimen:  Medication effectiveness/adherence: working well and general adherence with medications  Medication side effects: constipation  Interval events: recent ER visit  Interval symptoms: fatigue  Patient denies: chest pain  Home glucose readings: Recall 121 average  Diabetic screening: last retinal exam was within 1 year and last podiatry exam was within 1 year  Podiatric assessment: admits LE burning/tingling and admits loss of LE sensation  Exercise: none  Diet: moderate compliance with, American Dietetic Association (ADA) diet and low sodium diet  Response to therapy: has been good       Patient has a chronic history of diabetes, hypertension, hyperlipidemia and strokes  [facility] during which time she was taking medications suffers high blood pressure from her country, [place].  Patient refused to change her medication stating that the previous prescriptions from here has  caused her to have extreme side effects  Patient eventually saw cardiology and according to her they agreed to the medication she is taking.    Uncontrolled diabetes and patient was referred to endocrinologist  She is currently stable on insulin  She has significant peripheral neuropathy related to diabetes and given her previous stroke next sling she also has chronic back pain given lumbar spondylosis  Patient sees podiatrist and is up-to-date on exam  She is also up-to-date on ophthalmic eye exam    A1c trend:   Lab Results   Component Value Date    HGBA1C 7.8 (H) 05/03/2017    HGBA1C 8.0 (H) 02/01/2017    HGBA1C 8.2 (A) 11/08/2016     Trend is decreasing  Additional concerns: In addition, pt has hyperlipidemia and is compliant with lipid therapy.  Pt denies side effects of lipid therapy - specifically denies myalgia.      Problem List:  Patient Active Problem List   Diagnosis   . Uncontrolled type 2 diabetes mellitus with hyperglycemia, with long-term current use of insulin   . Mixed hyperlipidemia   . Benign hypertension   . Long-term insulin use   . Obesity (BMI 30-39.9)       Current Medications:  Current Outpatient Prescriptions   Medication Sig Dispense Refill   . B-D ULTRAFINE III SHORT PEN 31G X 8 MM Misc USE WITH LANTUS SOLOSTAR  AS DIRECTED  0   . glipiZIDE (GLUCOTROL) 10 MG 24 hr tablet Take 2 tabs by mouth daily with breakfast 180 tablet 1   . Insulin Degludec (TRESIBA FLEXTOUCH) 200 UNIT/ML Solution Pen-injector Inject 32 Units into the skin daily. 18 mL 1   . Insulin Pen Needle (PEN NEEDLES) 32G X 4 MM Misc 1 Unit by Does not apply route daily. 100 each 1   . NON FORMULARY Levanlodipino besilato 2.5     . NON FORMULARY Diupress 2.5 mg daily     . rosuvastatin (CRESTOR) 20 MG tablet Take 1 tablet (20 mg total) by mouth daily. 90 tablet 1     No current facility-administered medications for this visit.        Allergies:  Allergies   Allergen Reactions   . Codeine    . Gabapentin      Dizziness and rash,  irregular heart rate   . Glucosamine    . Latex    . Flaxseed [Linseed Oil] Rash   . Omega 3 [Fish Oil] Rash   . Simvastatin Rash       Past Medical History:  Past Medical History:   Diagnosis Date   . Depression    . Diabetes    . Hyperlipemia    . Hypertensive disorder    . Hypokalemia    . Neuropathy    . Stroke    . TIA (transient ischemic attack)        Past Surgical History:  Past Surgical History:   Procedure Laterality Date   . HYSTERECTOMY     . REDUCTION MAMMAPLASTY     . TONSILLECTOMY     . urinary bladder         Family History:  Family History   Problem Relation Age of Onset   . Diabetes Mother    . Diabetes Maternal Grandmother        Social History:  Social History     Social History   . Marital status: Married     Spouse name: N/A   . Number of children: N/A   . Years of education: N/A     Occupational History   . Not on file.     Social History Main Topics   . Smoking status: Never Smoker   . Smokeless tobacco: Never Used   . Alcohol use No   . Drug use: No   . Sexual activity: Not on file     Other Topics Concern   . Not on file     Social History Narrative   . No narrative on file       The following sections were reviewed this encounter by the provider:   Tobacco  Allergies  Meds  Problems  Med Hx  Surg Hx  Fam Hx  Soc Hx            Vitals:  BP 130/85 (BP Site: Left arm, Patient Position: Sitting, Cuff Size: Large)   Pulse 70   Temp 98.1 F (36.7 C) (Oral)   Ht 1.651 m (5\' 5" )   Wt 95.7 kg (211 lb)   SpO2 95%   BMI 35.11 kg/m         ROS:  General/Constitutional:   Denies Chills. Denies Fatigue. Denies Fever.   Ophthalmologic:   Denies Blurred vision. Denies Eye Pain.   ENT:   Denies Nasal Discharge. Denies Ear pain. Denies Sinus pain.   Endocrine:   Denies Polydipsia. Denies Polyuria.  Respiratory:   Denies Cough. Denies Orthopnea. Denies Shortness of breath. Denies Wheezing.   Cardiovascular:   Denies Chest pain. Denies Chest pain with exertion. Denies Leg Claudication. Denies   Palpitations. Denies Swelling in hands/feet.   Gastrointestinal:   Denies Abdominal pain. Denies Blood in stool. Denies Constipation. Denies Diarrhea.  Denies Heartburn. Denies Nausea. Denies Vomiting.   Genitourinary:   Denies Blood in urine. Denies Frequent urination. Denies Painful urination.   Musculoskeletal:   + Leg cramps. + Muscle aches.   Skin:   Denies Skin lesion(s).   Neurologic:   Denies Dizziness. +Gait abnormality. Denies Headache. + Tingling/Numbness.     Physical Exam:  Alert and oriented. Affect is normal and appropriate. Clear thought process with normal reasoning and conversational tone and logic. No psychomotor retardation. Not in any acute distress  Skin: no rash or abnormal lesions  Eyes - normal eye movements.  Pupils equal and reactive to light  Eyelid normal on exam  Ears - TM clear, external ear normal  Nose - no acute changes  Throat - Oral mucosa moist and pink, no abnormality noted  Neck- No thyroid enlargement noted, no palpable cervical lymph nodes  HEART: S1S2 heard, regular rate and rhythm  LUNGS: good air exchange, no crackles or ronchi or wheezing  ABDOMEN: Positive bowel sounds, no hepatospleenomegaly appreciated, no tenderness   Back: Normal curvature, no tenderness.   Extremities: FROM, no deformities, no edema, no erythema.   No knee effusion.   mild tendernesson the joint line.  Neuro: Physiological, speech better than before.   Right side weakness.   Restricted and only able to abduct <20%, Right leg weakness.   Strength RUE and RLE 3/5.         Skin: Normal, no rashes, no lesions noted.   Extremities: Warm, well perfused, no edema. Fingers stiff with some contracture L> R    microfilament exam shows reduce to sensation on both feet    no open wound    Dyslipidemia:  Lab Results   Component Value Date    CHOL 255 (H) 02/01/2017    CHOL 214 (H) 11/18/2010    TRIG 145 02/01/2017    TRIG 80 11/18/2010    HDL 43 02/01/2017    HDL 44 11/18/2010    LDL 183 (H) 02/01/2017    LDL  154 (H) 11/18/2010       Lab Results   Component Value Date    HGBA1C 7.8 (H) 05/03/2017    HGBA1C 8.0 (H) 02/01/2017    HGBA1C 8.2 (A) 11/08/2016         Assessment/Plan:       1. Uncontrolled type 2 diabetes mellitus with hyperglycemia, with long-term current use of insulin    2. Mixed hyperlipidemia  - Basic metabolic panel    3. Essential hypertension    4. Hypokalemia  - Basic metabolic panel    Diabetes:  Lab Results   Component Value Date    HGBA1C 7.8 (H) 05/03/2017    HGBA1C 8.0 (H) 02/01/2017    HGBA1C 8.2 (A) 11/08/2016      No results found for: MICROALB  Current A1C is elevated above goal and trend has significantly improved compared to prior values..  Continue current diet management for control of diabetes. Continue current DM medication regimen. Recommend home glucometer testing pre-meals three times a day. Recommend therapeutic lifestyle changes which include optimizing low carbohydrate diet and aerobic exercise efforts. Retinopathy surveillance is UTD. Podiatry surveillance is UTD. Microalbumin testing  is UTD within past year.   HTN:  patient recently had high blood pressure Continue to optimize low salt diet and aerobic exercise efforts. and had a long conversation with patient recommending that she switch over the medication PFD approved medication Recommend optimizing therapeutic lifestyle changes which include obtaining at least 150 minutes of aerobic exercise per week and eating a heart healthy diet ( i.e. DASH diet - www.heart.org or PopSteam.is ). Recommend out-of-office blood pressure measurements for titration of BP-lowering medication. Cardiology surveillance is UTD.   Patient is developed and in changing her medication  I discussed with patient that it makes it hard for all physicians to treat her neck she has high blood pressure without knowing the exact medication she is taking  Patient agreed to bring her medications with her for the next visit and we would try to make a  transition into FDA approved medication  Patient is on medication very similar to the amlodipine chlorthalidone combination  Meanwhile low-salt diet, continue current medication    Dyslipidemia:  Lab Results   Component Value Date    CHOL 255 (H) 02/01/2017    CHOL 214 (H) 11/18/2010    TRIG 145 02/01/2017    TRIG 80 11/18/2010    HDL 43 02/01/2017    HDL 44 11/18/2010    LDL 183 (H) 02/01/2017    LDL 154 (H) 11/18/2010     LDL is at goal on current anti-lipidemic medication regimen.  Continue current medication therapy.  Repeat lipid indices in 6 months.  Continue to optimize low carbohydrate diet and aerobic exercise efforts.    Anxiety depression-patient was previously started on Celexa by me and then she stopped stating that the she does not want to take medications for fear of side effects  Then she visited her podiatrist, who gave her some medication saying that it would help with her depression but she's not sure sure what medication she is taking  Patient will see me back in one week with all medications    Kinte Trim Bernadene Person, MD

## 2017-06-01 LAB — BASIC METABOLIC PANEL
BUN: 15 mg/dL (ref 7.0–19.0)
CO2: 30 mEq/L — ABNORMAL HIGH (ref 21–29)
Calcium: 9.6 mg/dL (ref 8.5–10.5)
Chloride: 99 mEq/L — ABNORMAL LOW (ref 100–111)
Creatinine: 0.8 mg/dL (ref 0.4–1.5)
Glucose: 177 mg/dL — ABNORMAL HIGH (ref 70–100)
Potassium: 4.1 mEq/L (ref 3.5–5.1)
Sodium: 138 mEq/L (ref 136–145)

## 2017-06-04 ENCOUNTER — Encounter (INDEPENDENT_AMBULATORY_CARE_PROVIDER_SITE_OTHER): Payer: Self-pay | Admitting: Family Medicine

## 2017-06-06 ENCOUNTER — Ambulatory Visit (INDEPENDENT_AMBULATORY_CARE_PROVIDER_SITE_OTHER): Payer: Self-pay | Admitting: Cardiovascular Disease

## 2017-06-06 LAB — VAHRT HISTORIC LVEF: Ejection Fraction: 65 %

## 2017-06-08 ENCOUNTER — Encounter (INDEPENDENT_AMBULATORY_CARE_PROVIDER_SITE_OTHER): Payer: Self-pay | Admitting: Family Medicine

## 2017-06-08 ENCOUNTER — Ambulatory Visit (INDEPENDENT_AMBULATORY_CARE_PROVIDER_SITE_OTHER): Payer: Medicare Other | Admitting: Family Medicine

## 2017-06-08 VITALS — BP 136/85 | HR 76 | Temp 97.8°F | Ht 65.0 in | Wt 211.0 lb

## 2017-06-08 DIAGNOSIS — E782 Mixed hyperlipidemia: Secondary | ICD-10-CM

## 2017-06-08 DIAGNOSIS — E1165 Type 2 diabetes mellitus with hyperglycemia: Secondary | ICD-10-CM

## 2017-06-08 DIAGNOSIS — Z794 Long term (current) use of insulin: Secondary | ICD-10-CM

## 2017-06-08 DIAGNOSIS — I10 Essential (primary) hypertension: Secondary | ICD-10-CM

## 2017-06-08 NOTE — Progress Notes (Signed)
Subjective:      Date: 06/08/2017 10:11 PM   Patient ID: Emily Rowe is a 66 y.o. female.    Chief Complaint:  Chief Complaint   Patient presents with   . Diabetes   . Results       HPI:  HPI  66 yr old female in for HTN FU  Was seen after ER FU recently     Pt taking BP med from [place]- novanlo 2.5, diupress 25/5  Was advised bring her meds and see if we can change to FDA appropved med  Aslo was told to bring all meds    Since last visit pt states doing well  She went and saw cardiologist [facility] this week  Was told EKG was fine  But he wants to do Holter per pt  States cardiologist was ok with her medication she is taking      Visit type: s/p ER visits  Type: essential  Associated Conditions: no evidence of CHF  Last follow-up: weeks ago  Compliance: is compliant with the current regimen  Comorbid illness: hyperlipidemia, hypertension, diabetes and prior CVA  (use .MGFRAMINGHAM for Framingham Risk Score documentation)  Interval events: recent ER visit  Interval symptoms: none  Patient denies: dizziness, chest pain, shortness of breath, palpitations, pre-syncope, edema, DOE, orthopnea or PND  Average ambulatory systolic pressure: 130's  Average ambulatory diastolic pressure: 80's  End organ damage from HTN: none  Lifestyle changes: avoidance of fat and decreased salt intake  Exercise: in pain s/p CVA - so not much  Response to medications: has been good  Prior testing: a chemistry profile showed normal renal function  Additional concerns: In addition, pt also needs medication refill.    Problem List:  Patient Active Problem List   Diagnosis   . Uncontrolled type 2 diabetes mellitus with hyperglycemia, with long-term current use of insulin   . Mixed hyperlipidemia   . Benign hypertension   . Long-term insulin use   . Obesity (BMI 30-39.9)       Current Medications:  Current Outpatient Prescriptions   Medication Sig Dispense Refill   . glipiZIDE (GLUCOTROL) 10 MG 24 hr tablet Take 2 tabs by mouth daily with  breakfast 180 tablet 1   . Insulin Degludec (TRESIBA FLEXTOUCH) 200 UNIT/ML Solution Pen-injector Inject 32 Units into the skin daily. 18 mL 1   . Insulin Pen Needle (PEN NEEDLES) 32G X 4 MM Misc 1 Unit by Does not apply route daily. 100 each 1   . Linagliptin (TRADJENTA) 5 MG Tab Take 5 mg by mouth daily.     . NON FORMULARY Levanlodipino besilato 2.5     . NON FORMULARY Diupress 2.5 mg daily     . rosuvastatin (CRESTOR) 20 MG tablet Take 1 tablet (20 mg total) by mouth daily. 90 tablet 1   . hydroxychloroquine (PLAQUENIL) 200 MG tablet Take 200 mg by mouth daily.       No current facility-administered medications for this visit.        Allergies:  Allergies   Allergen Reactions   . Codeine    . Gabapentin      Dizziness and rash, irregular heart rate   . Glucosamine    . Latex    . Flaxseed [Linseed Oil] Rash   . Omega 3 [Fish Oil] Rash   . Simvastatin Rash       Past Medical History:  Past Medical History:   Diagnosis Date   . Depression    .  Diabetes    . Hyperlipemia    . Hypertensive disorder    . Hypokalemia    . Neuropathy    . Stroke    . TIA (transient ischemic attack)        Past Surgical History:  Past Surgical History:   Procedure Laterality Date   . HYSTERECTOMY     . REDUCTION MAMMAPLASTY     . TONSILLECTOMY     . urinary bladder         Family History:  Family History   Problem Relation Age of Onset   . Diabetes Mother    . Diabetes Maternal Grandmother        Social History:  Social History     Social History   . Marital status: Married     Spouse name: N/A   . Number of children: N/A   . Years of education: N/A     Occupational History   . Not on file.     Social History Main Topics   . Smoking status: Never Smoker   . Smokeless tobacco: Never Used   . Alcohol use No   . Drug use: No   . Sexual activity: Not on file     Other Topics Concern   . Not on file     Social History Narrative   . No narrative on file       The following sections were reviewed this encounter by the provider:   Tobacco   Allergies  Meds  Problems  Med Hx  Surg Hx  Fam Hx  Soc Hx          Vitals:  BP 136/85 (BP Site: Left arm, Patient Position: Sitting, Cuff Size: Large)   Pulse 76   Temp 97.8 F (36.6 C) (Oral)   Ht 1.651 m (5\' 5" )   Wt 95.7 kg (211 lb)   SpO2 96%   BMI 35.11 kg/m     ROS:  General/Constitutional:   Denies Chills. Denies Fatigue. Denies Fever.   Ophthalmologic:   Denies Blurred vision. Denies Eye Pain.   ENT:   Denies Nasal Discharge. Denies Ear pain. Denies Sinus pain.   Endocrine:   Denies Polydipsia. Denies Polyuria.   Respiratory:   Denies Cough. Denies Orthopnea. Denies Shortness of breath. Denies Wheezing.   Cardiovascular:   Denies Chest pain. Denies Chest pain with exertion. Denies Leg Claudication. Denies  Palpitations. Denies Swelling in hands/feet.   Gastrointestinal:   Denies Abdominal pain. Denies Blood in stool. Denies Constipation. Denies Diarrhea.  Denies Heartburn. Denies Nausea. Denies Vomiting.   Genitourinary:   Denies Blood in urine. Denies Frequent urination. Denies Painful urination.   Musculoskeletal:   + Leg cramps. + Muscle aches.   Skin:   Denies Skin lesion(s).   Neurologic:   Denies Dizziness. +Gait abnormality. Denies Headache. + Tingling/Numbness.     Physical Exam:  Alert and oriented. Affect is normal and appropriate. Clear thought process with normal reasoning and conversational tone and logic. No psychomotor retardation. Not in any acute distress  Skin: no rash or abnormal lesions  Eyes - normal eye movements.  Pupils equal and reactive to light  Eyelid normal on exam  Ears - TM clear, external ear normal  Nose - no acute changes  Throat - Oral mucosa moist and pink, no abnormality noted  Neck- No thyroid enlargement noted, no palpable cervical lymph nodes  HEART: S1S2 heard, regular rate and rhythm  LUNGS: good air exchange, no crackles or  ronchi or wheezing  ABDOMEN: Positive bowel sounds, no hepatospleenomegaly appreciated, no tenderness   Back: Normal curvature,  no tenderness.   Extremities: FROM, no deformities, no edema, no erythema.   No knee effusion.   mild tendernesson the joint line.  Neuro: Physiological, speech better than before.   Right side weakness.   Restricted and only able to abduct <20%, Right leg weakness.   Strength RUE and RLE 3/5.         Skin: Normal, no rashes, no lesions noted.   Extremities: Warm, well perfused, no edema. Fingers stiff with some contracture L> R    microfilament exam shows reduce to sensation on both feet    no open wound         Assessment:       1. Essential hypertension  - Comprehensive metabolic panel; Future  - Lipid panel; Future    2. Uncontrolled type 2 diabetes mellitus with hyperglycemia, with long-term current use of insulin  - Comprehensive metabolic panel; Future  - Lipid panel; Future  - TSH, Abn Reflex to Free T4, Serum; Future    3. Mixed hyperlipidemia  - Comprehensive metabolic panel; Future  - Lipid panel; Future  - TSH, Abn Reflex to Free T4, Serum; Future        Plan:     HTN:  Current blood pressure is at goal (goal SBP<130; DBP<80). Continue to optimize low salt diet and aerobic exercise efforts. Recommend optimizing therapeutic lifestyle changes which include obtaining at least 150 minutes of aerobic exercise per week and eating a heart healthy diet ( i.e. DASH diet - www.heart.org or PopSteam.is ). Recommend out-of-office blood pressure measurements for titration of BP-lowering medication. Cardiology surveillance is UTD.     I advised starting amlodipine and ACE  Pt agreed but not sure  She just saw cardio Dr Theodis Blaze dn seems w/u in progress  Will check with him and see if he agrees to med change  I advised FDA approved meds    Rtn for fasting as she is not fastingctoday    Olivia Pavelko Bernadene Person, MD

## 2017-06-09 ENCOUNTER — Other Ambulatory Visit: Payer: Medicare Other

## 2017-06-12 ENCOUNTER — Other Ambulatory Visit (FREE_STANDING_LABORATORY_FACILITY): Payer: Medicare Other

## 2017-06-12 ENCOUNTER — Telehealth (INDEPENDENT_AMBULATORY_CARE_PROVIDER_SITE_OTHER): Payer: Self-pay

## 2017-06-12 ENCOUNTER — Ambulatory Visit (INDEPENDENT_AMBULATORY_CARE_PROVIDER_SITE_OTHER): Payer: Self-pay

## 2017-06-12 DIAGNOSIS — E782 Mixed hyperlipidemia: Secondary | ICD-10-CM

## 2017-06-12 DIAGNOSIS — E1165 Type 2 diabetes mellitus with hyperglycemia: Secondary | ICD-10-CM

## 2017-06-12 DIAGNOSIS — I10 Essential (primary) hypertension: Secondary | ICD-10-CM

## 2017-06-12 DIAGNOSIS — Z794 Long term (current) use of insulin: Secondary | ICD-10-CM

## 2017-06-12 LAB — COMPREHENSIVE METABOLIC PANEL
ALT: 18 U/L (ref 0–55)
AST (SGOT): 19 U/L (ref 5–34)
Albumin/Globulin Ratio: 1.1 (ref 0.9–2.2)
Albumin: 3.8 g/dL (ref 3.5–5.0)
Alkaline Phosphatase: 95 U/L (ref 37–106)
BUN: 13 mg/dL (ref 7.0–19.0)
Bilirubin, Total: 0.4 mg/dL (ref 0.1–1.2)
CO2: 29 mEq/L (ref 21–29)
Calcium: 9.4 mg/dL (ref 8.5–10.5)
Chloride: 101 mEq/L (ref 100–111)
Creatinine: 0.8 mg/dL (ref 0.4–1.5)
Globulin: 3.5 g/dL (ref 2.0–3.7)
Glucose: 149 mg/dL — ABNORMAL HIGH (ref 70–100)
Potassium: 3.7 mEq/L (ref 3.5–5.1)
Protein, Total: 7.3 g/dL (ref 6.0–8.3)
Sodium: 139 mEq/L (ref 136–145)

## 2017-06-12 LAB — THYROID STIMULATING HORMONE (TSH), REFLEX ON ABNORMAL TO FREE T4, SERUM: TSH, Abn Reflex to Free T4, Serum: 1.54 u[IU]/mL (ref 0.35–4.94)

## 2017-06-12 LAB — LIPID PANEL
Cholesterol / HDL Ratio: 5.7
Cholesterol: 241 mg/dL — ABNORMAL HIGH (ref 0–199)
HDL: 42 mg/dL (ref 40–9999)
LDL Calculated: 173 mg/dL — ABNORMAL HIGH (ref 0–99)
Triglycerides: 130 mg/dL (ref 34–149)
VLDL Calculated: 26 mg/dL (ref 10–40)

## 2017-06-12 LAB — GFR: EGFR: >60

## 2017-06-12 LAB — HEMOLYSIS INDEX: Hemolysis Index: 15 (ref 0–18)

## 2017-06-12 NOTE — Telephone Encounter (Signed)
Left message to inform patient to make a follow up appt.

## 2017-06-12 NOTE — Telephone Encounter (Signed)
Per Dr. Smith Robert , have patient shedule a follow up appointment to see Np Bynum Bellows today or as soon as possible .

## 2017-06-12 NOTE — Telephone Encounter (Signed)
Patient was in the hospital for over 2 weeks . Patient was seen in the hospital for a rapid heart rate , nausea, abdominal bloating , decreased appetite, and lower extremities swelling and blurred vision. Patient believes that these S/S are related to the insulin. Patient did not take insulin dosage yesterday was said that she felt better off the insulin. Please advise.

## 2017-06-16 ENCOUNTER — Telehealth (INDEPENDENT_AMBULATORY_CARE_PROVIDER_SITE_OTHER): Payer: Self-pay | Admitting: Family Medicine

## 2017-06-16 NOTE — Telephone Encounter (Signed)
Left message to call.

## 2017-06-16 NOTE — Telephone Encounter (Signed)
Attempted to contact patient to inquire more information in regards to her questions. States she isn't feeling well and wants to be off her "water pills" and that they are messing with her potassium. Please call patient to clarify. Thank you!

## 2017-06-16 NOTE — Telephone Encounter (Signed)
Patient called requesting to speak with Dr. Delman Cheadle in reference to changing her Blood Pressure Medication. Please call patient at [phone].    Thank You,    Hilda Lias

## 2017-06-20 ENCOUNTER — Ambulatory Visit (FREE_STANDING_LABORATORY_FACILITY): Payer: Medicare Other | Admitting: Internal Medicine

## 2017-06-20 ENCOUNTER — Encounter (INDEPENDENT_AMBULATORY_CARE_PROVIDER_SITE_OTHER): Payer: Self-pay | Admitting: Internal Medicine

## 2017-06-20 VITALS — BP 126/82 | HR 84 | Temp 97.8°F | Resp 16 | Ht 65.0 in | Wt 211.0 lb

## 2017-06-20 DIAGNOSIS — E1165 Type 2 diabetes mellitus with hyperglycemia: Secondary | ICD-10-CM

## 2017-06-20 DIAGNOSIS — E876 Hypokalemia: Secondary | ICD-10-CM

## 2017-06-20 DIAGNOSIS — I10 Essential (primary) hypertension: Secondary | ICD-10-CM

## 2017-06-20 DIAGNOSIS — Z794 Long term (current) use of insulin: Secondary | ICD-10-CM

## 2017-06-20 DIAGNOSIS — E782 Mixed hyperlipidemia: Secondary | ICD-10-CM

## 2017-06-20 DIAGNOSIS — E669 Obesity, unspecified: Secondary | ICD-10-CM

## 2017-06-20 MED ORDER — LISINOPRIL 40 MG PO TABS
40.0000 mg | ORAL_TABLET | Freq: Every day | ORAL | 3 refills | Status: DC
Start: 2017-06-20 — End: 2019-05-11

## 2017-06-20 MED ORDER — LANTUS SOLOSTAR 100 UNIT/ML SC SOPN
PEN_INJECTOR | SUBCUTANEOUS | 1 refills | Status: DC
Start: 2017-06-20 — End: 2017-06-30

## 2017-06-20 NOTE — Progress Notes (Signed)
Subjective:      Date: 06/20/2017 1:33 PM   Patient ID: Emily Rowe is a 66 y.o. female.    Chief Complaint:  Chief Complaint   Patient presents with   . Uncontrolled type 2 diabetes mellitus with hyperglycemia, wi       HPI  Visit type: follow-up - type II DM   Diagnosed: 5 years ago  Type: Insulin requiring  Complications: hyperglycemia  Control: inadequate control  Comorbid illness: hyperlipidemia  Last follow-up: 3 months ago  Diabetes Medication Regimen: Glipizide XL 20 mg daily ; Currently off Tradjenta 5 mg daily, Tresiba 32 units daily.  Unable to tolerate Metformin due to diarrhea, unable to tolerate Victoza due to n/v, unable to tolerate Invokana   Medication effectiveness/adherence: working well  Medication side effects: none  Interval events: recent ER visit 05/24/2017  Interval symptoms: HTN , Headache , dizziness  Patient denies: no other symptoms including chest pain, shortness of breath, hypoglycemic episodes, polydipsia, polyuria, headaches, dizziness, or abdominal pain  Home glucose readings: fasting 96 to 111; 2 hours post prandial 102 to 180; no BG readings are lower than 70  Daily basal insulin dosing: Tresiba  Daily bolus insulin dosing: non-insulin requiring  Diabetic screening: 2 months ago  Podiatric assessment: admits LE burning/tingling due to hx of stroke   Exercise: walks occasionally   Diet: inconsistent compliance with carb controlled diet   Response to therapy: with hemoglobin A1C above goal  A1c trend: improving     Lab Results   Component Value Date    HGBA1C 7.8 (H) 05/03/2017    HGBA1C 8.0 (H) 02/01/2017    HGBA1C 8.2 (A) 11/08/2016     Trend is decreasing  Additional concerns: In addition, pt has hyperlipidemia and is compliant with lipid therapy.  Pt denies side effects of lipid therapy - specifically denies myalgia.     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     Reports severe hypokalemia required ER visit; had been off  lisinopril at that time.    Review of Systems     ROS: A complete 12 point ROS was obtained. Pertinent positives and negatives as noted in HPI. All other systems are negative.      Problem List:  Patient Active Problem List   Diagnosis   . Uncontrolled type 2 diabetes mellitus with hyperglycemia, with long-term current use of insulin   . Mixed hyperlipidemia   . Benign hypertension   . Long-term insulin use   . Obesity (BMI 30-39.9)       Current Medications:  Current Outpatient Prescriptions   Medication Sig Dispense Refill   . glipiZIDE (GLUCOTROL) 10 MG 24 hr tablet Take 2 tabs by mouth daily with breakfast 180 tablet 1   . NON FORMULARY Levanlodipino besilato 2.5     . NON FORMULARY Diupress 2.5 mg daily     . rosuvastatin (CRESTOR) 20 MG tablet Take 1 tablet (20 mg total) by mouth daily. 90 tablet 1   . hydroxychloroquine (PLAQUENIL) 200 MG tablet Take 200 mg by mouth daily.     . Insulin Degludec (TRESIBA FLEXTOUCH) 200 UNIT/ML Solution Pen-injector Inject 32 Units into the skin daily. 18 mL 1   . Insulin Pen Needle (PEN NEEDLES) 32G X 4 MM Misc 1 Unit by Does not apply route daily. 100 each 1   . Linagliptin (TRADJENTA) 5 MG Tab Take 5 mg by mouth daily.  No current facility-administered medications for this visit.        Allergies:  Allergies   Allergen Reactions   . Codeine    . Gabapentin      Dizziness and rash, irregular heart rate   . Glucosamine    . Latex    . Flaxseed [Linseed Oil] Rash   . Omega 3 [Fish Oil] Rash   . Simvastatin Rash       Past Medical History:  Past Medical History:   Diagnosis Date   . Depression    . Diabetes    . Hyperlipemia    . Hypertensive disorder    . Hypokalemia    . Neuropathy    . Stroke    . TIA (transient ischemic attack)        Past Surgical History:  Past Surgical History:   Procedure Laterality Date   . HYSTERECTOMY     . REDUCTION MAMMAPLASTY     . TONSILLECTOMY     . urinary bladder         Family History:  Family History   Problem Relation Age of Onset   .  Diabetes Mother    . Diabetes Maternal Grandmother        Social History:  Social History     Social History   . Marital status: Married     Spouse name: N/A   . Number of children: N/A   . Years of education: N/A     Occupational History   . Not on file.     Social History Main Topics   . Smoking status: Never Smoker   . Smokeless tobacco: Never Used   . Alcohol use No   . Drug use: No   . Sexual activity: Not on file     Other Topics Concern   . Not on file     Social History Narrative   . No narrative on file       The following sections were reviewed this encounter by the provider:       ROS   A complete 12 point ROS was obtained. Pertinent positives and negatives as noted in HPI. All other systems are negative.      Vitals:  BP 126/82   Pulse 84   Temp 97.8 F (36.6 C)   Resp 16   Ht 1.651 m (5\' 5" )   Wt 95.7 kg (211 lb)   SpO2 94%   BMI 35.11 kg/m    Wt Readings from Last 3 Encounters:   06/20/17 95.7 kg (211 lb)   06/08/17 95.7 kg (211 lb)   05/31/17 95.7 kg (211 lb)       Physical Examination     General appearance: alert, appears stated age, cooperative and no distress  Eyes: conjunctivae/corneas clear. PERRL, EOM's intact.   Neck: no cervical lymphadenopathy, no carotid bruit,  supple, symmetrical, trachea midline   Thyroid:normal sized on inspection; non tender to palpation and no discrete nodules palpated  Lungs: clear to auscultation bilaterally  Heart: regular rate and rhythm, S1, S2 normal, no murmur, click, rub or gallop  Extremities: extremities normal, atraumatic, no cyanosis or edema  Foot Exam: monofilament testing of plantar aspect of first toe and metatarsal joints (10g monofilament) intact bilaterally, no fissures, maceration, ulceration or lesions bilaterally, normal hair growth bilaterally, normal pigmentation bilaterally and vibratory perception (128 Hz tuning fork) applied to first toe intact bilaterally; Pedal pulses 2+ bilaterally  Skin: Skin color, texture, turgor normal. No  rashes or lesions  Neurologic: Alert and oriented X 3, normal strength and tone. Normal symmetric reflexes. No tremor    Assessment and Plan     1. Uncontrolled type 2 diabetes mellitus with hyperglycemia, with long-term current use of insulin  - since last visit 6 weeks ago, fasting and post prandial BS have been at goal   - continue Glipizide XL 20 mg daily, Tradjenta 5 mg daily. Discussed switching to lantus 20 units daily.  - Significant interval improvement in glycemic control, without episodes of hypoglycemia.  Lab Results   Component Value Date    HGBA1C 7.8 (H) 05/03/2017    HGBA1C 8.0 (H) 02/01/2017    HGBA1C 8.2 (A) 11/08/2016       Current A1C is elevated above goal and trend has increased compared to prior values..  Recommend optimizing low carbohydrate diet efforts and drinking at least 60 ounces of H20 daily.  Exercise goal - 150 minutes of sustained cardio exercise per week. Recommend home glucometer testing twice a day  ( pre-breakfast or 2 hrs post-prandial).  Retinopathy surveillance is UTD. Podiatry surveillance is UTD. Microalbumin testing is UTD within past year.      2. Mixed hyperlipidemia  Lab Results   Component Value Date    CHOL 241 (H) 06/12/2017    CHOL 255 (H) 02/01/2017    CHOL 214 (H) 11/18/2010     Lab Results   Component Value Date    HDL 42 06/12/2017    HDL 43 02/01/2017    HDL 44 11/18/2010     Lab Results   Component Value Date    LDL 173 (H) 06/12/2017    LDL 183 (H) 02/01/2017    LDL 154 (H) 11/18/2010     Lab Results   Component Value Date    TRIG 130 06/12/2017    TRIG 145 02/01/2017    TRIG 80 11/18/2010     - LDL not at goal; discussed switching from zocor to crestor. Higher dose of zocor is associated with elevated liver tests.  - fasting Lipid panel at next visit       3. Hx of CVA       - stable; BP at goal; check Lipid panel at next visit       4. Obesity (BMI 34-34.9)       - recommend calorie restricted diet and optimize carb controlled diet       - increase physical  activity as tolerated     5. Hypokalemia  Resume lisinopril; likely exacerbated by use of diuretic alone.    RTC in 2 months    Tanya Nones MD MPH  Endocrinologist, [facility]

## 2017-06-21 LAB — COMPREHENSIVE METABOLIC PANEL
ALT: 18 U/L (ref 0–55)
AST (SGOT): 19 U/L (ref 5–34)
Albumin/Globulin Ratio: 1.1 (ref 0.9–2.2)
Albumin: 3.8 g/dL (ref 3.5–5.0)
Alkaline Phosphatase: 94 U/L (ref 37–106)
BUN: 13 mg/dL (ref 7.0–19.0)
Bilirubin, Total: 0.3 mg/dL (ref 0.1–1.2)
CO2: 28 mEq/L (ref 21–29)
Calcium: 9.4 mg/dL (ref 8.5–10.5)
Chloride: 99 mEq/L — ABNORMAL LOW (ref 100–111)
Creatinine: 1.1 mg/dL (ref 0.4–1.5)
Globulin: 3.5 g/dL (ref 2.0–3.7)
Glucose: 172 mg/dL — ABNORMAL HIGH (ref 70–100)
Potassium: 3.7 mEq/L (ref 3.5–5.1)
Protein, Total: 7.3 g/dL (ref 6.0–8.3)
Sodium: 138 mEq/L (ref 136–145)

## 2017-06-21 LAB — GFR: EGFR: 50.1

## 2017-06-21 LAB — HEMOLYSIS INDEX: Hemolysis Index: 10 (ref 0–18)

## 2017-06-22 ENCOUNTER — Telehealth (INDEPENDENT_AMBULATORY_CARE_PROVIDER_SITE_OTHER): Payer: Self-pay

## 2017-06-22 ENCOUNTER — Encounter (INDEPENDENT_AMBULATORY_CARE_PROVIDER_SITE_OTHER): Payer: Self-pay | Admitting: Internal Medicine

## 2017-06-22 DIAGNOSIS — E119 Type 2 diabetes mellitus without complications: Secondary | ICD-10-CM

## 2017-06-22 NOTE — Telephone Encounter (Signed)
[place] Lantus denied - has not tried and failed ALL formulary alternatives (Levemir)

## 2017-06-22 NOTE — Telephone Encounter (Signed)
Left msg to call the office

## 2017-06-22 NOTE — Telephone Encounter (Signed)
Last note states was on tresiba was there a side effect? If so would send in levemir pen at same dose as tresiba

## 2017-06-23 ENCOUNTER — Encounter (INDEPENDENT_AMBULATORY_CARE_PROVIDER_SITE_OTHER): Payer: Self-pay | Admitting: Internal Medicine

## 2017-06-30 MED ORDER — ACCU-CHEK AVIVA VI STRP
ORAL_STRIP | 1 refills | Status: DC
Start: 2017-06-30 — End: 2017-07-12

## 2017-06-30 MED ORDER — LEVEMIR FLEXPEN 100 UNIT/ML SC SOPN
32.0000 [IU] | PEN_INJECTOR | Freq: Every evening | SUBCUTANEOUS | 1 refills | Status: DC
Start: 2017-06-30 — End: 2018-07-02

## 2017-06-30 MED ORDER — PEN NEEDLES 32G X 4 MM MISC
1.0000 [IU] | Freq: Every day | 1 refills | Status: DC
Start: 2017-06-30 — End: 2018-06-05

## 2017-06-30 NOTE — Telephone Encounter (Signed)
Prescription sent to pharmacy on file. 

## 2017-06-30 NOTE — Telephone Encounter (Signed)
lv 06/20/17

## 2017-07-04 NOTE — Progress Notes (Signed)
Lab results are in the normal/ expected range. There is mild interval decrease in kidney function. Recommend improving hydration and we will continue to monitor.

## 2017-07-12 ENCOUNTER — Other Ambulatory Visit (INDEPENDENT_AMBULATORY_CARE_PROVIDER_SITE_OTHER): Payer: Self-pay

## 2017-07-12 MED ORDER — GLUCOSE BLOOD VI STRP
ORAL_STRIP | 1 refills | Status: DC
Start: 2017-07-12 — End: 2018-06-05

## 2017-07-12 NOTE — Telephone Encounter (Signed)
Please send corrected strips  lv 06/20/17

## 2017-07-25 ENCOUNTER — Encounter (INDEPENDENT_AMBULATORY_CARE_PROVIDER_SITE_OTHER): Payer: Self-pay | Admitting: Internal Medicine

## 2017-07-25 ENCOUNTER — Ambulatory Visit (INDEPENDENT_AMBULATORY_CARE_PROVIDER_SITE_OTHER): Payer: Medicare Other | Admitting: Internal Medicine

## 2017-07-25 VITALS — BP 127/75 | HR 80 | Temp 98.3°F | Resp 16 | Ht 65.0 in | Wt 207.0 lb

## 2017-07-25 DIAGNOSIS — Z794 Long term (current) use of insulin: Secondary | ICD-10-CM

## 2017-07-25 DIAGNOSIS — E1165 Type 2 diabetes mellitus with hyperglycemia: Secondary | ICD-10-CM

## 2017-07-25 DIAGNOSIS — I10 Essential (primary) hypertension: Secondary | ICD-10-CM

## 2017-07-25 DIAGNOSIS — Z6834 Body mass index (BMI) 34.0-34.9, adult: Secondary | ICD-10-CM

## 2017-07-25 DIAGNOSIS — E782 Mixed hyperlipidemia: Secondary | ICD-10-CM

## 2017-07-25 DIAGNOSIS — E669 Obesity, unspecified: Secondary | ICD-10-CM

## 2017-07-25 DIAGNOSIS — E876 Hypokalemia: Secondary | ICD-10-CM

## 2017-07-25 NOTE — Progress Notes (Signed)
Subjective:      Date: 07/25/2017 9:17 AM   Patient ID: Emily Rowe is a 66 y.o. female.    Chief Complaint:  Chief Complaint   Patient presents with   . Uncontrolled type 2 diabetes mellitus with hyperglycemia, wi       HPI  Visit type: follow-up - type II DM   Diagnosed: 5 years ago  Type: Insulin requiring  Complications: hyperglycemia  Control: inadequate control  Comorbid illness: hyperlipidemia  Last follow-up: 2 months ago  Diabetes Medication Regimen: Glipizide XL 10 mg daily ; Currently off Tradjenta 5 mg daily. Not taking levemir for 1 and a half weeks.  Unable to tolerate Metformin due to diarrhea, unable to tolerate Victoza due to n/v, unable to tolerate Invokana   Medication effectiveness/adherence: working well  Medication side effects: none  Interval events: recent ER visit 05/24/2017  Interval symptoms: HTN , Headache , dizziness  Patient denies: no other symptoms including chest pain, shortness of breath, hypoglycemic episodes, polydipsia, polyuria, headaches, dizziness, or abdominal pain  Home glucose readings: fasting 96 to 111; 2 hours post prandial 102 to 180; no BG readings are lower than 70  Daily basal insulin dosing: Tresiba  Daily bolus insulin dosing: non-insulin requiring  Diabetic screening: 2 months ago  Podiatric assessment: admits LE burning/tingling due to hx of stroke   Exercise: walks occasionally   Diet: inconsistent compliance with carb controlled diet   Response to therapy: with hemoglobin A1C above goal  A1c trend: improving     Lab Results   Component Value Date    HGBA1C 7.8 (H) 05/03/2017    HGBA1C 8.0 (H) 02/01/2017    HGBA1C 8.2 (A) 11/08/2016     Trend is decreasing  Additional concerns: In addition, pt has hyperlipidemia and is compliant with lipid therapy.  Pt denies side effects of lipid therapy - specifically denies myalgia.     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     Reports severe hypokalemia required ER  visit; had been off lisinopril at that time.    Review of Systems     ROS: A complete 12 point ROS was obtained. Pertinent positives and negatives as noted in HPI. All other systems are negative.      Problem List:  Patient Active Problem List   Diagnosis   . Uncontrolled type 2 diabetes mellitus with hyperglycemia, with long-term current use of insulin   . Mixed hyperlipidemia   . Benign hypertension   . Long-term insulin use   . Obesity (BMI 30-39.9)       Current Medications:  Current Outpatient Prescriptions   Medication Sig Dispense Refill   . glipiZIDE (GLUCOTROL) 10 MG 24 hr tablet Take 2 tabs by mouth daily with breakfast 180 tablet 1   . glucose blood (ACCU-CHEK SMARTVIEW) test strip Use as instructed to check blood sugar twice daily DIAGNOSIS E11.65 ACCU-CHEK SMARTVIEW 200 each 1   . Insulin Pen Needle (PEN NEEDLES) 32G X 4 MM Misc 1 Unit by Does not apply route daily. 100 each 1   . lisinopril (PRINIVIL,ZESTRIL) 40 MG tablet Take 1 tablet (40 mg total) by mouth daily. 90 tablet 3   . NON FORMULARY Levanlodipino besilato 2.5     . NON FORMULARY Diupress 2.5 mg daily     . rosuvastatin (CRESTOR) 20 MG tablet Take 1 tablet (20 mg total) by mouth daily. 90 tablet 1   . LEVEMIR FLEXPEN 100 UNIT/ML  injection pen Inject 32 Units into the skin nightly. 30 mL 1   . Linagliptin (TRADJENTA) 5 MG Tab Take 5 mg by mouth daily.       No current facility-administered medications for this visit.        Allergies:  Allergies   Allergen Reactions   . Codeine    . Gabapentin      Dizziness and rash, irregular heart rate   . Glucosamine    . Latex    . Flaxseed [Linseed Oil] Rash   . Omega 3 [Fish Oil] Rash   . Simvastatin Rash       Past Medical History:  Past Medical History:   Diagnosis Date   . Depression    . Diabetes    . Hyperlipemia    . Hypertensive disorder    . Hypokalemia    . Neuropathy    . Stroke    . TIA (transient ischemic attack)        Past Surgical History:  Past Surgical History:   Procedure Laterality  Date   . HYSTERECTOMY     . REDUCTION MAMMAPLASTY     . TONSILLECTOMY     . urinary bladder         Family History:  Family History   Problem Relation Age of Onset   . Diabetes Mother    . Diabetes Maternal Grandmother        Social History:  Social History     Social History   . Marital status: Married     Spouse name: N/A   . Number of children: N/A   . Years of education: N/A     Occupational History   . Not on file.     Social History Main Topics   . Smoking status: Never Smoker   . Smokeless tobacco: Never Used   . Alcohol use No   . Drug use: No   . Sexual activity: Not on file     Other Topics Concern   . Not on file     Social History Narrative   . No narrative on file       The following sections were reviewed this encounter by the provider:       ROS   A complete 12 point ROS was obtained. Pertinent positives and negatives as noted in HPI. All other systems are negative.      Vitals:  BP 127/75   Pulse 80   Temp 98.3 F (36.8 C)   Resp 16   Ht 1.651 m (5\' 5" )   Wt 93.9 kg (207 lb)   SpO2 96%   BMI 34.45 kg/m    Wt Readings from Last 3 Encounters:   07/25/17 93.9 kg (207 lb)   06/20/17 95.7 kg (211 lb)   06/08/17 95.7 kg (211 lb)       Physical Examination     General appearance: alert, appears stated age, cooperative and no distress  Eyes: conjunctivae/corneas clear. PERRL, EOM's intact.   Neck: no cervical lymphadenopathy, no carotid bruit,  supple, symmetrical, trachea midline   Thyroid:normal sized on inspection; non tender to palpation and no discrete nodules palpated  Lungs: clear to auscultation bilaterally  Heart: regular rate and rhythm, S1, S2 normal, no murmur, click, rub or gallop  Extremities: extremities normal, atraumatic, no cyanosis or edema  Foot Exam: monofilament testing of plantar aspect of first toe and metatarsal joints (10g monofilament) intact bilaterally, no fissures, maceration, ulceration or lesions bilaterally,  normal hair growth bilaterally, normal pigmentation  bilaterally and vibratory perception (128 Hz tuning fork) applied to first toe intact bilaterally; Pedal pulses 2+ bilaterally  Skin: Skin color, texture, turgor normal. No rashes or lesions.  Neurologic: Alert and oriented X 3, normal strength and tone. Normal symmetric reflexes. No tremor    Assessment and Plan     1. Uncontrolled type 2 diabetes mellitus with hyperglycemia, with long-term current use of insulin  - since last visit 6 weeks ago, fasting and post prandial BS have been at goal   - continue Glipizide XL 20 mg daily, Tradjenta 5 mg daily. Discussed switching to lantus 20 units daily.  - Significant interval improvement in glycemic control, without episodes of hypoglycemia.  Lab Results   Component Value Date    HGBA1C 7.8 (H) 05/03/2017    HGBA1C 8.0 (H) 02/01/2017    HGBA1C 8.2 (A) 11/08/2016       Current A1C is elevated above goal and trend has increased compared to prior values..  Recommend optimizing low carbohydrate diet efforts and drinking at least 60 ounces of H20 daily.  Exercise goal - 150 minutes of sustained cardio exercise per week. Recommend home glucometer testing twice a day  ( pre-breakfast or 2 hrs post-prandial).  Retinopathy surveillance is UTD. Podiatry surveillance is UTD. Microalbumin testing is UTD within past year.      2. Mixed hyperlipidemia  Lab Results   Component Value Date    CHOL 241 (H) 06/12/2017    CHOL 255 (H) 02/01/2017    CHOL 214 (H) 11/18/2010     Lab Results   Component Value Date    HDL 42 06/12/2017    HDL 43 02/01/2017    HDL 44 11/18/2010     Lab Results   Component Value Date    LDL 173 (H) 06/12/2017    LDL 183 (H) 02/01/2017    LDL 154 (H) 11/18/2010     Lab Results   Component Value Date    TRIG 130 06/12/2017    TRIG 145 02/01/2017    TRIG 80 11/18/2010     - LDL not at goal; discussed switching from zocor to crestor. Higher dose of zocor is associated with elevated liver tests.  - fasting Lipid panel at next visit       3. Hx of CVA       - stable; BP  at goal; check Lipid panel at next visit       4. Obesity (BMI 34-34.9)       - recommend calorie restricted diet and optimize carb controlled diet       - increase physical activity as tolerated     5. Hypokalemia  Resume lisinopril; likely exacerbated by use of diuretic alone. Recommend screening to rule out hyperaldosteronism.    RTC in 2 months    Tanya Nones MD MPH  Endocrinologist, [facility]

## 2017-08-01 ENCOUNTER — Encounter (INDEPENDENT_AMBULATORY_CARE_PROVIDER_SITE_OTHER): Payer: Self-pay | Admitting: Internal Medicine

## 2017-08-08 ENCOUNTER — Other Ambulatory Visit: Payer: Medicare Other

## 2017-08-10 ENCOUNTER — Other Ambulatory Visit: Payer: Medicare Other

## 2017-08-11 ENCOUNTER — Other Ambulatory Visit (FREE_STANDING_LABORATORY_FACILITY): Payer: Medicare Other

## 2017-08-11 DIAGNOSIS — Z794 Long term (current) use of insulin: Secondary | ICD-10-CM

## 2017-08-11 DIAGNOSIS — E782 Mixed hyperlipidemia: Secondary | ICD-10-CM

## 2017-08-11 DIAGNOSIS — E1165 Type 2 diabetes mellitus with hyperglycemia: Secondary | ICD-10-CM

## 2017-08-11 DIAGNOSIS — E876 Hypokalemia: Secondary | ICD-10-CM

## 2017-08-11 DIAGNOSIS — I10 Essential (primary) hypertension: Secondary | ICD-10-CM

## 2017-08-11 LAB — COMPREHENSIVE METABOLIC PANEL
ALT: 17 U/L (ref 0–55)
AST (SGOT): 16 U/L (ref 5–34)
Albumin/Globulin Ratio: 1.1 (ref 0.9–2.2)
Albumin: 3.8 g/dL (ref 3.5–5.0)
Alkaline Phosphatase: 95 U/L (ref 37–106)
BUN: 13 mg/dL (ref 7.0–19.0)
Bilirubin, Total: 0.3 mg/dL (ref 0.1–1.2)
CO2: 30 mEq/L — ABNORMAL HIGH (ref 21–29)
Calcium: 9.6 mg/dL (ref 8.5–10.5)
Chloride: 99 mEq/L — ABNORMAL LOW (ref 100–111)
Creatinine: 0.8 mg/dL (ref 0.4–1.5)
Globulin: 3.4 g/dL (ref 2.0–3.7)
Glucose: 167 mg/dL — ABNORMAL HIGH (ref 70–100)
Potassium: 3.9 mEq/L (ref 3.5–5.1)
Protein, Total: 7.2 g/dL (ref 6.0–8.3)
Sodium: 137 mEq/L (ref 136–145)

## 2017-08-11 LAB — LIPID PANEL
Cholesterol / HDL Ratio: 4.7
Cholesterol: 186 mg/dL (ref 0–199)
HDL: 40 mg/dL (ref 40–9999)
LDL Calculated: 120 mg/dL — ABNORMAL HIGH (ref 0–99)
Triglycerides: 130 mg/dL (ref 34–149)
VLDL Calculated: 26 mg/dL (ref 10–40)

## 2017-08-11 LAB — HEMOGLOBIN A1C
Average Estimated Glucose: 214.5 mg/dL
Hemoglobin A1C: 9.1 % — ABNORMAL HIGH (ref 4.6–5.9)

## 2017-08-11 LAB — HEMOLYSIS INDEX: Hemolysis Index: 2 (ref 0–18)

## 2017-08-11 LAB — TSH: TSH: 1.41 u[IU]/mL (ref 0.35–4.94)

## 2017-08-11 LAB — GFR: EGFR: >60

## 2017-08-20 LAB — ALDOSTERONE/PLASMA RENIN ACTIVITY RATIO
ALDO/PRA Ratio: 12.8 (ref 0.9–28.9)
Aldosterone: 5 ng/dL
PRA, LC/MS/MS: 0.39 (ref 0.25–5.82)

## 2017-09-25 ENCOUNTER — Ambulatory Visit (INDEPENDENT_AMBULATORY_CARE_PROVIDER_SITE_OTHER): Payer: Medicare Other | Admitting: Internal Medicine

## 2017-09-25 ENCOUNTER — Encounter (INDEPENDENT_AMBULATORY_CARE_PROVIDER_SITE_OTHER): Payer: Self-pay | Admitting: Internal Medicine

## 2017-09-25 VITALS — BP 128/85 | HR 67 | Temp 97.8°F | Ht 65.0 in | Wt 210.0 lb

## 2017-09-25 DIAGNOSIS — Z794 Long term (current) use of insulin: Secondary | ICD-10-CM

## 2017-09-25 DIAGNOSIS — E669 Obesity, unspecified: Secondary | ICD-10-CM

## 2017-09-25 DIAGNOSIS — Z6834 Body mass index (BMI) 34.0-34.9, adult: Secondary | ICD-10-CM

## 2017-09-25 DIAGNOSIS — E876 Hypokalemia: Secondary | ICD-10-CM

## 2017-09-25 DIAGNOSIS — E1165 Type 2 diabetes mellitus with hyperglycemia: Secondary | ICD-10-CM

## 2017-09-25 DIAGNOSIS — Z8673 Personal history of transient ischemic attack (TIA), and cerebral infarction without residual deficits: Secondary | ICD-10-CM

## 2017-09-25 DIAGNOSIS — I10 Essential (primary) hypertension: Secondary | ICD-10-CM

## 2017-09-25 DIAGNOSIS — E782 Mixed hyperlipidemia: Secondary | ICD-10-CM

## 2017-09-25 MED ORDER — METFORMIN HCL ER 500 MG PO TB24
1000.0000 mg | ORAL_TABLET | Freq: Every day | ORAL | 1 refills | Status: DC
Start: 2017-09-25 — End: 2018-02-22

## 2017-09-25 MED ORDER — EZETIMIBE 10 MG PO TABS
10.0000 mg | ORAL_TABLET | Freq: Every day | ORAL | 1 refills | Status: DC
Start: 2017-09-25 — End: 2018-06-05

## 2017-09-25 NOTE — Progress Notes (Signed)
Subjective:      Date: 09/25/2017 11:22 AM   Patient ID: Emily Rowe is a 66 y.o. female.    Chief Complaint:  Chief Complaint   Patient presents with   . Diabetes       HPI  Visit type: follow-up - type II DM   Diagnosed: 5 years ago  Type: Insulin requiring  Complications: hyperglycemia  Control: inadequate control  Comorbid illness: hyperlipidemia  Last follow-up: 2 months ago  Diabetes Medication Regimen: Glipizide XL 10 mg daily; Currently off Tradjenta 5 mg daily. Not taking levemir for 1 and a half weeks.  Unable to tolerate Metformin due to diarrhea, unable to tolerate Victoza due to n/v, unable to tolerate Invokana. Taking potassium pill daily along with eating sweet potato and avocado everyday.  Medication effectiveness/adherence: working well  Medication side effects: none  Interval events: recent ER visit 05/24/2017  Interval symptoms: HTN , Headache , dizziness  Patient denies: no other symptoms including chest pain, shortness of breath, hypoglycemic episodes, polydipsia, polyuria, headaches, dizziness, or abdominal pain  Home glucose readings: fasting 96 to 111; 2 hours post prandial 102 to 180; no BG readings are lower than 70  Daily basal insulin dosing: Tresiba  Daily bolus insulin dosing: non-insulin requiring  Diabetic screening: 2 months ago  Podiatric assessment: admits LE burning/tingling due to hx of stroke   Exercise: walks occasionally   Diet: inconsistent compliance with carb controlled diet   Response to therapy: with hemoglobin A1C above goal  A1c trend: improving     Lab Results   Component Value Date    HGBA1C 9.1 (H) 08/11/2017    HGBA1C 7.8 (H) 05/03/2017    HGBA1C 8.0 (H) 02/01/2017     Trend is decreasing  Additional concerns: In addition, pt has hyperlipidemia and is compliant with lipid therapy.  Pt denies side effects of lipid therapy - specifically denies myalgia.     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     Reports  severe hypokalemia required ER visit; had been off lisinopril at that time.  Notes allergic reaction soon after taking crestor.    Review of Systems     ROS: A complete 12 point ROS was obtained. Pertinent positives and negatives as noted in HPI. All other systems are negative.      Problem List:  Patient Active Problem List   Diagnosis   . Uncontrolled type 2 diabetes mellitus with hyperglycemia, with long-term current use of insulin   . Mixed hyperlipidemia   . Benign hypertension   . Long-term insulin use   . Obesity (BMI 30-39.9)       Current Medications:  Current Outpatient Prescriptions   Medication Sig Dispense Refill   . glipiZIDE (GLUCOTROL) 10 MG 24 hr tablet Take 2 tabs by mouth daily with breakfast 180 tablet 1   . glucose blood (ACCU-CHEK SMARTVIEW) test strip Use as instructed to check blood sugar twice daily DIAGNOSIS E11.65 ACCU-CHEK SMARTVIEW 200 each 1   . Insulin Pen Needle (PEN NEEDLES) 32G X 4 MM Misc 1 Unit by Does not apply route daily. 100 each 1   . LEVEMIR FLEXPEN 100 UNIT/ML injection pen Inject 32 Units into the skin nightly. 30 mL 1   . Linagliptin (TRADJENTA) 5 MG Tab Take 5 mg by mouth daily.     Marland Kitchen lisinopril (PRINIVIL,ZESTRIL) 40 MG tablet Take 1 tablet (40 mg total) by mouth daily. 90 tablet 3   .  NON FORMULARY Levanlodipino besilato 2.5     . NON FORMULARY Diupress 2.5 mg daily     . rosuvastatin (CRESTOR) 20 MG tablet Take 1 tablet (20 mg total) by mouth daily. 90 tablet 1     No current facility-administered medications for this visit.        Allergies:  Allergies   Allergen Reactions   . Codeine    . Gabapentin      Dizziness and rash, irregular heart rate   . Glucosamine    . Latex    . Flaxseed [Linseed Oil] Rash   . Omega 3 [Fish Oil] Rash   . Simvastatin Rash       Past Medical History:  Past Medical History:   Diagnosis Date   . Depression    . Diabetes    . Hyperlipemia    . Hypertensive disorder    . Hypokalemia    . Neuropathy    . Stroke    . TIA (transient ischemic  attack)        Past Surgical History:  Past Surgical History:   Procedure Laterality Date   . HYSTERECTOMY     . REDUCTION MAMMAPLASTY     . TONSILLECTOMY     . urinary bladder         Family History:  Family History   Problem Relation Age of Onset   . Diabetes Mother    . Diabetes Maternal Grandmother        Social History:  Social History     Social History   . Marital status: Married     Spouse name: N/A   . Number of children: N/A   . Years of education: N/A     Occupational History   . Not on file.     Social History Main Topics   . Smoking status: Never Smoker   . Smokeless tobacco: Never Used   . Alcohol use No   . Drug use: No   . Sexual activity: Not on file     Other Topics Concern   . Not on file     Social History Narrative   . No narrative on file       The following sections were reviewed this encounter by the provider:       ROS:  A complete 12 point ROS was obtained. Pertinent positives and negatives as noted in HPI. All other systems are negative.      Vitals:  BP 128/85   Pulse 67   Temp 97.8 F (36.6 C) (Oral)   Ht 1.651 m (5\' 5" )   Wt 95.3 kg (210 lb)   SpO2 96%   BMI 34.95 kg/m    Wt Readings from Last 3 Encounters:   09/25/17 95.3 kg (210 lb)   07/25/17 93.9 kg (207 lb)   06/20/17 95.7 kg (211 lb)       Physical Examination     General appearance: alert, appears stated age, cooperative and no distress  Eyes: conjunctivae/corneas clear. PERRL, EOM's intact.   Neck: no cervical lymphadenopathy, no carotid bruit,  supple, symmetrical, trachea midline   Thyroid:normal sized on inspection; non tender to palpation and no discrete nodules palpated  Lungs: clear to auscultation bilaterally  Heart: regular rate and rhythm, S1, S2 normal, no murmur, click, rub or gallop  Extremities: extremities normal, atraumatic, no cyanosis or edema  Foot Exam: monofilament testing of plantar aspect of first toe and metatarsal joints (10g monofilament) intact bilaterally, no  fissures, maceration, ulceration or  lesions bilaterally, normal hair growth bilaterally, normal pigmentation bilaterally and vibratory perception (128 Hz tuning fork) applied to first toe intact bilaterally; Pedal pulses 2+ bilaterally  Skin: Skin color, texture, turgor normal. No rashes or lesions.  Neurologic: Alert and oriented X 3, normal strength and tone. Normal symmetric reflexes. No tremor    Assessment and Plan     1. Uncontrolled type 2 diabetes mellitus with hyperglycemia, with long-term current use of insulin  - since last visit 6 weeks ago, fasting and post prandial BS have been at goal   - continue Glipizide XL 20 mg daily, Tradjenta 5 mg daily. Recommend initiation of metformin ER 1000 mg daily with food.  - Significant interval improvement in glycemic control, without episodes of hypoglycemia. Has not tolerated initiation of insulin due to hypokalemia.    Lab Results   Component Value Date    HGBA1C 9.1 (H) 08/11/2017    HGBA1C 7.8 (H) 05/03/2017    HGBA1C 8.0 (H) 02/01/2017       Current A1C is elevated above goal and trend has increased compared to prior values..  Recommend optimizing low carbohydrate diet efforts and drinking at least 60 ounces of H20 daily.  Exercise goal - 150 minutes of sustained cardio exercise per week. Recommend home glucometer testing twice a day  ( pre-breakfast or 2 hrs post-prandial).  Retinopathy surveillance is UTD. Podiatry surveillance is UTD. Microalbumin testing is UTD within past year.      2. Mixed hyperlipidemia  Lab Results   Component Value Date    CHOL 186 08/11/2017    CHOL 241 (H) 06/12/2017    CHOL 255 (H) 02/01/2017     Lab Results   Component Value Date    HDL 40 08/11/2017    HDL 42 06/12/2017    HDL 43 02/01/2017     Lab Results   Component Value Date    LDL 120 (H) 08/11/2017    LDL 173 (H) 06/12/2017    LDL 183 (H) 02/01/2017     Lab Results   Component Value Date    TRIG 130 08/11/2017    TRIG 130 06/12/2017    TRIG 145 02/01/2017     - LDL not at goal; discussed switching from zocor  to crestor. Higher dose of zocor is associated with elevated liver tests.  - fasting Lipid panel at next visit       3. Hx of CVA       - stable; BP at goal; check Lipid panel at next visit       4. Obesity (BMI 34-34.9)       - recommend calorie restricted diet and optimize carb controlled diet       - increase physical activity as tolerated     5. Hypokalemia  Resume lisinopril; likely exacerbated by use of diuretic alone. Recommend screening to rule out hyperaldosteronism.    6. Hypertension  BP is well controlled on the current meds.    RTC in 2 months    Tanya Nones MD MPH  Endocrinologist, [facility]

## 2017-09-25 NOTE — Patient Instructions (Signed)
Ezetimibe Tablets  Brand Name: Zetia  What is this medicine?  EZETIMIBE (ez ET i mibe) blocks the absorption of cholesterol from the stomach. It can help lower blood cholesterol for patients who are at risk of getting heart disease or a stroke. It is only for patients whose cholesterol level is not controlled by diet.  How should I use this medicine?  Take this medicine by mouth with a glass of water. Follow the directions on the prescription label. This medicine can be taken with or without food. Take your doses at regular intervals. Do not take your medicine more often than directed.  Talk to your pediatrician regarding the use of this medicine in children. Special care may be needed.  What side effects may I notice from receiving this medicine?  Side effects that you should report to your doctor or health care professional as soon as possible:   allergic reactions like skin rash, itching or hives, swelling of the face, lips, or tongue   dark yellow or brown urine   unusually weak or tired   yellowing of the skin or eyes  Side effects that usually do not require medical attention (report to your doctor or health care professional if they continue or are bothersome):   diarrhea   dizziness   headache   stomach upset or pain  What may interact with this medicine?  Do not take this medicine with any of the following medications:   fenofibrate   gemfibrozil  This medicine may also interact with the following medications:   antacids   cyclosporine   herbal medicines like red yeast rice   other medicines to lower cholesterol or triglycerides  What if I miss a dose?  If you miss a dose, take it as soon as you can. If it is almost time for your next dose, take only that dose. Do not take double or extra doses.  Where should I keep my medicine?  Keep out of the reach of children.  Store at room temperature between 15 and 30 degrees C (59 and 86 degrees F). Protect from moisture. Keep container tightly  closed. Throw away any unused medicine after the expiration date.  What should I tell my health care provider before I take this medicine?  They need to know if you have any of these conditions:   liver disease   an unusual or allergic reaction to ezetimibe, medicines, foods, dyes, or preservatives   pregnant or trying to get pregnant   breast-feeding  What should I watch for while using this medicine?  Visit your doctor or health care professional for regular checks on your progress. You will need to have your cholesterol levels checked. If you are also taking some other cholesterol medicines, you will also need to have tests to make sure your liver is working properly.  Tell your doctor or health care professional if you get any unexplained muscle pain, tenderness, or weakness, especially if you also have a fever and tiredness.  You need to follow a low-cholesterol, low-fat diet while you are taking this medicine. This will decrease your risk of getting heart and blood vessel disease. Exercising and avoiding alcohol and smoking can also help. Ask your doctor or dietician for advice.  NOTE:This sheet is a summary. It may not cover all possible information. If you have questions about this medicine, talk to your doctor, pharmacist, or health care provider. Copyright 2018 [employer]

## 2017-10-25 ENCOUNTER — Encounter (INDEPENDENT_AMBULATORY_CARE_PROVIDER_SITE_OTHER): Payer: Self-pay

## 2017-10-31 ENCOUNTER — Encounter (INDEPENDENT_AMBULATORY_CARE_PROVIDER_SITE_OTHER): Payer: Self-pay

## 2017-11-22 ENCOUNTER — Ambulatory Visit (FREE_STANDING_LABORATORY_FACILITY): Payer: Medicare Other | Admitting: Internal Medicine

## 2017-11-22 ENCOUNTER — Encounter (INDEPENDENT_AMBULATORY_CARE_PROVIDER_SITE_OTHER): Payer: Self-pay | Admitting: Internal Medicine

## 2017-11-22 VITALS — BP 115/77 | HR 72 | Resp 16 | Ht 65.0 in | Wt 204.0 lb

## 2017-11-22 DIAGNOSIS — E669 Obesity, unspecified: Secondary | ICD-10-CM

## 2017-11-22 DIAGNOSIS — Z6833 Body mass index (BMI) 33.0-33.9, adult: Secondary | ICD-10-CM

## 2017-11-22 DIAGNOSIS — Z789 Other specified health status: Secondary | ICD-10-CM

## 2017-11-22 DIAGNOSIS — E782 Mixed hyperlipidemia: Secondary | ICD-10-CM

## 2017-11-22 DIAGNOSIS — E1165 Type 2 diabetes mellitus with hyperglycemia: Secondary | ICD-10-CM

## 2017-11-22 DIAGNOSIS — I10 Essential (primary) hypertension: Secondary | ICD-10-CM

## 2017-11-22 DIAGNOSIS — E876 Hypokalemia: Secondary | ICD-10-CM

## 2017-11-22 DIAGNOSIS — Z794 Long term (current) use of insulin: Secondary | ICD-10-CM

## 2017-11-22 LAB — COMPREHENSIVE METABOLIC PANEL
ALT: 14 U/L (ref 0–55)
AST (SGOT): 16 U/L (ref 5–34)
Albumin/Globulin Ratio: 1.1 (ref 0.9–2.2)
Albumin: 3.8 g/dL (ref 3.5–5.0)
Alkaline Phosphatase: 88 U/L (ref 37–106)
BUN: 22 mg/dL — ABNORMAL HIGH (ref 7.0–19.0)
Bilirubin, Total: 0.3 mg/dL (ref 0.2–1.2)
CO2: 28 mEq/L (ref 21–29)
Calcium: 9.7 mg/dL (ref 8.5–10.5)
Chloride: 99 mEq/L — ABNORMAL LOW (ref 100–111)
Creatinine: 0.9 mg/dL (ref 0.4–1.5)
Globulin: 3.5 g/dL (ref 2.0–3.7)
Glucose: 129 mg/dL — ABNORMAL HIGH (ref 70–100)
Potassium: 3.5 mEq/L (ref 3.5–5.1)
Protein, Total: 7.3 g/dL (ref 6.0–8.3)
Sodium: 139 mEq/L (ref 136–145)

## 2017-11-22 LAB — MICROALBUMIN, RANDOM URINE
Urine Creatinine, Random: 62.4 mg/dL
Urine Microalbumin, Random: <5 (ref 0.0–30.0)

## 2017-11-22 LAB — LIPID PANEL
Cholesterol / HDL Ratio: 5.4
Cholesterol: 232 mg/dL — ABNORMAL HIGH (ref 0–199)
HDL: 43 mg/dL (ref 40–9999)
LDL Calculated: 158 mg/dL — ABNORMAL HIGH (ref 0–99)
Triglycerides: 156 mg/dL — ABNORMAL HIGH (ref 34–149)
VLDL Calculated: 31 mg/dL (ref 10–40)

## 2017-11-22 LAB — GFR: EGFR: >60

## 2017-11-22 LAB — HEMOLYSIS INDEX: Hemolysis Index: 1 (ref 0–18)

## 2017-11-22 LAB — TSH: TSH: 1.13 u[IU]/mL (ref 0.35–4.94)

## 2017-11-22 LAB — HEMOGLOBIN A1C
Average Estimated Glucose: 197.3 mg/dL
Hemoglobin A1C: 8.5 % — ABNORMAL HIGH (ref 4.6–5.9)

## 2017-11-22 NOTE — Progress Notes (Signed)
Subjective:      Date: 11/22/2017 9:15 AM   Patient ID: Emily Rowe is a 66 y.o. female.    Chief Complaint:  Chief Complaint   Patient presents with   . Uncontrolled type 2 diabetes mellitus with hyperglycemia, wi       HPI  Visit type: follow-up - type II DM   Diagnosed: 5 years ago  Type: Insulin requiring  Complications: hyperglycemia  Control: inadequate control  Comorbid illness: hyperlipidemia  Last follow-up: 2 months ago  Diabetes Medication Regimen: Glipizide XL 20 mg daily , Tradjenta 5 mg daily , Levemir 28 units at bedtime   Unable to tolerate Metformin due to diarrhea, unable to tolerate Victoza due to n/v, unable to tolerate Invokana. Taking potassium pill daily along with eating sweet potato and avocado everyday.  Medication effectiveness/adherence: working well  Medication side effects: none  Interval events: recent ER visit 05/24/2017  Interval symptoms: HTN , Headache , dizziness  Patient denies: no other symptoms including chest pain, shortness of breath, hypoglycemic episodes, polydipsia, polyuria, headaches, dizziness, or abdominal pain  Home glucose readings: Sugars range 133-140 mg/dl During the day, blood sugars have not been uncontrolled per report. Did not bring her sugar log recently.  Daily basal insulin dosing: Tresiba  Daily bolus insulin dosing: non-insulin requiring  Diabetic screening: 2 months ago  Podiatric assessment: admits LE burning/tingling due to hx of stroke   Exercise: walks occasionally   Diet: inconsistent compliance with carb controlled diet   Response to therapy: with hemoglobin A1C above goal  A1c trend: improving     Lab Results   Component Value Date    HGBA1C 9.1 (H) 08/11/2017    HGBA1C 7.8 (H) 05/03/2017    HGBA1C 8.0 (H) 02/01/2017     Trend is decreasing  Additional concerns: In addition, pt has hyperlipidemia and is compliant with lipid therapy.  Pt denies side effects of lipid therapy - specifically denies myalgia.     Hx of CVA, with residual right  sided weakness and antalgic gait, no cognitive deficits.     Reports severe hypokalemia which required ER visit; had been off lisinopril at that time.  Notes allergic reaction soon after taking crestor.    Review of Systems     ROS: A complete 12 point ROS was obtained. Pertinent positives and negatives as noted in HPI. All other systems are negative.      Problem List:  Patient Active Problem List   Diagnosis   . Uncontrolled type 2 diabetes mellitus with hyperglycemia, with long-term current use of insulin   . Mixed hyperlipidemia   . Benign hypertension   . Long-term insulin use   . Obesity (BMI 30-39.9)       Current Medications:  Current Outpatient Prescriptions   Medication Sig Dispense Refill   . ezetimibe (ZETIA) 10 MG tablet Take 1 tablet (10 mg total) by mouth daily. 90 tablet 1   . glipiZIDE (GLUCOTROL) 10 MG 24 hr tablet Take 2 tabs by mouth daily with breakfast 180 tablet 1   . glucose blood (ACCU-CHEK SMARTVIEW) test strip Use as instructed to check blood sugar twice daily DIAGNOSIS E11.65 ACCU-CHEK SMARTVIEW 200 each 1   . Insulin Pen Needle (PEN NEEDLES) 32G X 4 MM Misc 1 Unit by Does not apply route daily. 100 each 1   . LEVEMIR FLEXPEN 100 UNIT/ML injection pen Inject 32 Units into the skin nightly. (Patient taking differently: Inject 28 Units into the skin nightly.    )  30 mL 1   . Linagliptin (TRADJENTA) 5 MG Tab Take 5 mg by mouth daily.     Marland Kitchen lisinopril (PRINIVIL,ZESTRIL) 40 MG tablet Take 1 tablet (40 mg total) by mouth daily. 90 tablet 3   . NON FORMULARY Levanlodipino besilato 2.5     . NON FORMULARY Diupress 2.5 mg daily     . simvastatin (ZOCOR) 40 MG tablet Take 40 mg by mouth nightly.     . metFORMIN (GLUCOPHAGE XR) 500 MG 24 hr tablet Take 2 tablets (1,000 mg total) by mouth daily. 180 tablet 1     No current facility-administered medications for this visit.        Allergies:  Allergies   Allergen Reactions   . Codeine Nausea And Vomiting   . Crestor [Rosuvastatin] Rash     D/c due to  severe rash and higher blood sugars   . Glucosamine Hives and Nausea And Vomiting   . Latex Rash   . Gabapentin      Dizziness and rash, irregular heart rate   . Flaxseed [Linseed Oil] Rash   . Omega 3 [Fish Oil] Rash   . Simvastatin Rash     Patients reports able to tolerate medication and is currently taking        Past Medical History:  Past Medical History:   Diagnosis Date   . Depression    . Diabetes    . Hyperlipemia    . Hypertensive disorder    . Hypokalemia    . Neuropathy    . Stroke    . TIA (transient ischemic attack)        Past Surgical History:  Past Surgical History:   Procedure Laterality Date   . HYSTERECTOMY     . REDUCTION MAMMAPLASTY     . TONSILLECTOMY     . urinary bladder         Family History:  Family History   Problem Relation Age of Onset   . Diabetes Mother    . Diabetes Maternal Grandmother        Social History:  Social History     Social History   . Marital status: Married     Spouse name: N/A   . Number of children: N/A   . Years of education: N/A     Occupational History   . Not on file.     Social History Main Topics   . Smoking status: Never Smoker   . Smokeless tobacco: Never Used   . Alcohol use No   . Drug use: No   . Sexual activity: Not on file     Other Topics Concern   . Not on file     Social History Narrative   . No narrative on file       The following sections were reviewed this encounter by the provider:       ROS:  A complete 12 point ROS was obtained. Pertinent positives and negatives as noted in HPI. All other systems are negative.      Vitals:  BP 115/77   Pulse 72   Resp 16   Ht 1.651 m (5\' 5" )   Wt 92.5 kg (204 lb)   SpO2 98%   BMI 33.95 kg/m    Wt Readings from Last 3 Encounters:   11/22/17 92.5 kg (204 lb)   09/25/17 95.3 kg (210 lb)   07/25/17 93.9 kg (207 lb)     -6 lbs since 12/18  Physical Examination     General appearance: alert, appears stated age, cooperative and no distress  Eyes: conjunctivae/corneas clear. PERRL, EOM's intact.   Neck: no  cervical lymphadenopathy, no carotid bruit,  supple, symmetrical, trachea midline   Thyroid:normal sized on inspection; non tender to palpation and no discrete nodules palpated  Lungs: clear to auscultation bilaterally  Heart: regular rate and rhythm, S1, S2 normal, no murmur, click, rub or gallop  Extremities: extremities normal, atraumatic, no cyanosis or edema  Foot Exam: monofilament testing of plantar aspect of first toe and metatarsal joints (10g monofilament) intact bilaterally, no fissures, maceration, ulceration or lesions bilaterally, normal hair growth bilaterally, normal pigmentation bilaterally and vibratory perception (128 Hz tuning fork) applied to first toe intact bilaterally; Pedal pulses 2+ bilaterally  Skin: Skin color, texture, turgor normal. No rashes or lesions.  Neurologic: Alert and oriented X 3, normal strength and tone. Normal symmetric reflexes. No tremor    Assessment and Plan     1. Uncontrolled type 2 diabetes mellitus with hyperglycemia, with long-term current use of insulin  - since last visit 6 weeks ago, fasting and post prandial blood sugars have been at goal   - continue Glipizide XL 20 mg daily, Tradjenta 5 mg daily. Recommend initiation of metformin ER 1000 mg daily with food.  - Significant interval improvement in glycemic control, without episodes of hypoglycemia. Has not tolerated initiation of insulin due to hypokalemia.    Lab Results   Component Value Date    HGBA1C 9.1 (H) 08/11/2017    HGBA1C 7.8 (H) 05/03/2017    HGBA1C 8.0 (H) 02/01/2017       Current A1C is elevated above goal and trend has increased compared to prior values..  Recommend optimizing low carbohydrate diet efforts and drinking at least 60 ounces of H20 daily.  Exercise goal - 150 minutes of sustained cardio exercise per week. Recommend home glucometer testing twice a day  ( pre-breakfast or 2 hrs post-prandial).  Retinopathy surveillance is UTD. Podiatry surveillance is UTD. Microalbumin testing is UTD  within past year.      2. Mixed hyperlipidemia  Lab Results   Component Value Date    CHOL 186 08/11/2017    CHOL 241 (H) 06/12/2017    CHOL 255 (H) 02/01/2017     Lab Results   Component Value Date    HDL 40 08/11/2017    HDL 42 06/12/2017    HDL 43 02/01/2017     Lab Results   Component Value Date    LDL 120 (H) 08/11/2017    LDL 173 (H) 06/12/2017    LDL 183 (H) 02/01/2017     Lab Results   Component Value Date    TRIG 130 08/11/2017    TRIG 130 06/12/2017    TRIG 145 02/01/2017     - LDL not at goal; discussed switching from zocor to crestor. Higher dose of zocor is associated with elevated liver tests.  - fasting Lipid panel at next visit       3. Hx of CVA       - stable; BP at goal; check Lipid panel at next visit       4. Obesity (BMI 33-33.9)       - recommend calorie restricted diet and optimize carb controlled diet       - increase physical activity as tolerated     5. Hypokalemia  Resume lisinopril; likely exacerbated by use of diuretic alone. Recommend repeat screening to rule  out hyperaldosteronism.    6. Hypertension  BP is well controlled on the current meds.    RTC in 2 months    Tanya Nones MD MPH  Endocrinologist, [facility]

## 2017-11-22 NOTE — Progress Notes (Signed)
Please call with the following: A1c/ 3 month estimate of avg sugars is in the range of uncontrolled diabetes; target A1c is less than 7%. Bad cholesterol level is high; recommend continuation of the statin and zetia; the rest of the lab results are in the acceptable range. Please ensure compliance with checking your sugars at least x3/ day and taking medications as instructed regularly. Please bring sugar log to your follow up visit.

## 2017-11-25 ENCOUNTER — Other Ambulatory Visit (INDEPENDENT_AMBULATORY_CARE_PROVIDER_SITE_OTHER): Payer: Self-pay | Admitting: Internal Medicine

## 2017-11-25 DIAGNOSIS — E1165 Type 2 diabetes mellitus with hyperglycemia: Secondary | ICD-10-CM

## 2017-11-27 ENCOUNTER — Other Ambulatory Visit (INDEPENDENT_AMBULATORY_CARE_PROVIDER_SITE_OTHER): Payer: Self-pay

## 2017-11-27 NOTE — Telephone Encounter (Signed)
LV 11/22/17

## 2017-11-29 ENCOUNTER — Ambulatory Visit (INDEPENDENT_AMBULATORY_CARE_PROVIDER_SITE_OTHER): Payer: Medicare Other | Admitting: Internal Medicine

## 2017-11-29 MED ORDER — SIMVASTATIN 40 MG PO TABS
40.00 mg | ORAL_TABLET | Freq: Every evening | ORAL | 1 refills | Status: DC
Start: 2017-11-29 — End: 2018-06-02

## 2017-12-08 ENCOUNTER — Other Ambulatory Visit (INDEPENDENT_AMBULATORY_CARE_PROVIDER_SITE_OTHER): Payer: Self-pay | Admitting: Internal Medicine

## 2017-12-08 DIAGNOSIS — E119 Type 2 diabetes mellitus without complications: Secondary | ICD-10-CM

## 2017-12-08 MED ORDER — LINAGLIPTIN 5 MG PO TABS
5.00 mg | ORAL_TABLET | Freq: Every day | ORAL | 1 refills | Status: DC
Start: 2017-12-08 — End: 2018-06-02

## 2017-12-20 ENCOUNTER — Ambulatory Visit (INDEPENDENT_AMBULATORY_CARE_PROVIDER_SITE_OTHER): Payer: Medicare Other | Admitting: Internal Medicine

## 2018-01-03 ENCOUNTER — Encounter (INDEPENDENT_AMBULATORY_CARE_PROVIDER_SITE_OTHER): Payer: Self-pay | Admitting: "Endocrinology

## 2018-01-03 ENCOUNTER — Ambulatory Visit (INDEPENDENT_AMBULATORY_CARE_PROVIDER_SITE_OTHER): Payer: No Typology Code available for payment source | Admitting: "Endocrinology

## 2018-01-03 VITALS — BP 135/79 | HR 86 | Resp 16 | Ht 65.0 in | Wt 202.8 lb

## 2018-01-03 DIAGNOSIS — E119 Type 2 diabetes mellitus without complications: Secondary | ICD-10-CM

## 2018-01-03 DIAGNOSIS — Z794 Long term (current) use of insulin: Secondary | ICD-10-CM

## 2018-01-03 LAB — BASIC METABOLIC PANEL
BUN: 17 mg/dL (ref 7.0–19.0)
CO2: 27 mEq/L (ref 21–29)
Calcium: 9.6 mg/dL (ref 8.5–10.5)
Chloride: 99 mEq/L — ABNORMAL LOW (ref 100–111)
Creatinine: 0.8 mg/dL (ref 0.4–1.5)
Glucose: 161 mg/dL — ABNORMAL HIGH (ref 70–100)
Potassium: 3.5 mEq/L (ref 3.5–5.1)
Sodium: 138 mEq/L (ref 136–145)

## 2018-01-03 LAB — GFR: EGFR: >60

## 2018-01-03 LAB — HEMOLYSIS INDEX: Hemolysis Index: 6 (ref 0–18)

## 2018-01-03 NOTE — Progress Notes (Signed)
Subjective:      Date: 01/03/2018 10:23 AM   Patient ID: Emily Rowe is a 67 y.o. female.    Chief Complaint:  Chief Complaint   Patient presents with   . Diabetes     type 2 w/ ins       HPI  Visit type: follow-up - type II DM   Diagnosed: over 5 years ago  Type: Insulin requiring  Complications: hyperglycemia  Control: inadequate control  Comorbid illness: hyperlipidemia  Last follow-up: a month ago with Dr. Maeola Harman   Diabetes Medication Regimen: Glipizide XL 20 mg daily , Tradjenta 5 mg daily , Levemir 28 units at bedtime (stopped taking Levemir for 1 month now)   Unable to tolerate Metformin due to diarrhea, unable to tolerate Victoza due to n/v, unable to tolerate Invokana. Taking potassium pill daily along with eating sweet potato and avocado everyday.  Medication effectiveness/adherence: working well and stopped taking Levemir   Medication side effects: none  Interval events: no interval events   Interval symptoms: none  Patient denies: no other symptoms including chest pain, shortness of breath, hypoglycemic episodes, polydipsia, polyuria, headaches, dizziness, or abdominal pain  Home glucose readings: download: few BS readings since last office visit:  Fasting 142;  After lunch 160, 113  Daily basal insulin dosing: Levemir (stopped taking 1 month ago)   Daily bolus insulin dosing: non-insulin requiring  Diabetic screening: yesterday, January 02, 2018, no known retinopathy  Podiatric assessment: admits LE burning/tingling due to hx of stroke   Exercise: walks occasionally   Diet: moderate compliance with recommended diet; eats more vegetables and protein   Response to therapy: with hemoglobin A1C above goal  A1c trend: improving     Lab Results   Component Value Date    HGBA1C 8.5 (H) 11/22/2017    HGBA1C 9.1 (H) 08/11/2017    HGBA1C 7.8 (H) 05/03/2017     Trend is decreasing  Additional concerns: In addition, pt has hyperlipidemia and is compliant with lipid therapy.  Pt denies side effects of lipid  therapy - specifically denies myalgia., In addition, pt has stable essential HTN with no evidence of CHF or proteinuria.  The patient is compliant with anti-hypertensive therapy and denies side effects to therapy.  Pt denies CP, SOB, dizziness, orthopnea, PND or edema.     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     Reports severe hypokalemia which required ER visit; had been off lisinopril at that time.  Notes allergic reaction soon after taking crestor.    Came back from her vacation in [place] 2 days ago, notes blood sugars as well as blood pressure while in [place] have been better. Stopped Levemir, has been having low BS recently.        Problem List:  Patient Active Problem List   Diagnosis   . Uncontrolled type 2 diabetes mellitus with hyperglycemia   . Mixed hyperlipidemia   . Benign hypertension   . Long-term insulin use   . Obesity (BMI 30-39.9)       Current Medications:  Current Outpatient Prescriptions   Medication Sig Dispense Refill   . ezetimibe (ZETIA) 10 MG tablet Take 1 tablet (10 mg total) by mouth daily. 90 tablet 1   . glipiZIDE (GLUCOTROL) 10 MG 24 hr tablet TAKE 2 TABLETS BY MOUTH EVERY DAY WITH BREAKFAST 180 tablet 1   . glucose blood (ACCU-CHEK SMARTVIEW) test strip Use as instructed to  check blood sugar twice daily DIAGNOSIS E11.65 ACCU-CHEK SMARTVIEW 200 each 1   . Insulin Pen Needle (PEN NEEDLES) 32G X 4 MM Misc 1 Unit by Does not apply route daily. 100 each 1   . Linagliptin (TRADJENTA) 5 MG Tab Take 1 tablet (5 mg total) by mouth daily. 90 tablet 1   . lisinopril (PRINIVIL,ZESTRIL) 40 MG tablet Take 1 tablet (40 mg total) by mouth daily. 90 tablet 3   . metFORMIN (GLUCOPHAGE XR) 500 MG 24 hr tablet Take 2 tablets (1,000 mg total) by mouth daily. 180 tablet 1   . NON FORMULARY Levanlodipino besilato 2.5     . NON FORMULARY Diupress 2.5 mg daily     . simvastatin (ZOCOR) 40 MG tablet Take 1 tablet (40 mg total) by mouth nightly. 90  tablet 1   . LEVEMIR FLEXPEN 100 UNIT/ML injection pen Inject 32 Units into the skin nightly. (Patient taking differently: Inject 28 Units into the skin nightly.    ) 30 mL 1     No current facility-administered medications for this visit.        Allergies:  Allergies   Allergen Reactions   . Codeine Nausea And Vomiting   . Crestor [Rosuvastatin] Rash     D/c due to severe rash and higher blood sugars   . Glucosamine Hives and Nausea And Vomiting   . Latex Rash   . Gabapentin      Dizziness and rash, irregular heart rate   . Flaxseed [Linseed Oil] Rash   . Omega 3 [Fish Oil] Rash   . Simvastatin Rash     Patients reports able to tolerate medication and is currently taking        Past Medical History:  Past Medical History:   Diagnosis Date   . Depression    . Diabetes    . Hyperlipemia    . Hypertensive disorder    . Hypokalemia    . Neuropathy    . Stroke    . TIA (transient ischemic attack)        Past Surgical History:  Past Surgical History:   Procedure Laterality Date   . HYSTERECTOMY     . REDUCTION MAMMAPLASTY     . TONSILLECTOMY     . urinary bladder         Family History:  Family History   Problem Relation Age of Onset   . Diabetes Mother    . Diabetes Maternal Grandmother        Social History:  Social History     Social History   . Marital status: Married     Spouse name: N/A   . Number of children: N/A   . Years of education: N/A     Occupational History   . Not on file.     Social History Main Topics   . Smoking status: Never Smoker   . Smokeless tobacco: Never Used   . Alcohol use No   . Drug use: No   . Sexual activity: Not on file     Other Topics Concern   . Not on file     Social History Narrative   . No narrative on file       The following sections were reviewed this encounter by the provider:   Tobacco  Allergies  Meds  Problems  Med Hx  Surg Hx  Fam Hx  Soc Hx         ROS:  A complete 12  point ROS was obtained. Pertinent positives and negatives as noted in HPI. All other systems are  negative.      Vitals:  BP 135/79 (BP Site: Left arm, Patient Position: Sitting, Cuff Size: Medium)   Pulse 86   Resp 16   Ht 1.651 m (5\' 5" )   Wt 92 kg (202 lb 12.8 oz)   SpO2 97%   BMI 33.75 kg/m    Wt Readings from Last 3 Encounters:   01/03/18 92 kg (202 lb 12.8 oz)   11/22/17 92.5 kg (204 lb)   09/25/17 95.3 kg (210 lb)     -6 lbs since 12/18    Physical Examination     General appearance: alert, appears stated age, cooperative and no distress  Eyes: conjunctivae/corneas clear. PERRL, EOM's intact.   Neck: no cervical lymphadenopathy, no carotid bruit,  supple, symmetrical, trachea midline   Thyroid:normal sized on inspection; non tender to palpation and no discrete nodules palpated  Lungs: clear to auscultation bilaterally  Heart: regular rate and rhythm, S1, S2 normal, no murmur, click, rub or gallop  Extremities: extremities normal, atraumatic, no cyanosis or edema  Foot Exam: monofilament testing of plantar aspect of first toe and metatarsal joints (10g monofilament) intact bilaterally, no fissures, maceration, ulceration or lesions bilaterally, normal hair growth bilaterally, normal pigmentation bilaterally and vibratory perception (128 Hz tuning fork) applied to first toe intact bilaterally; Pedal pulses 2+ bilaterally  Skin: Skin color, texture, turgor normal. No rashes or lesions.  Neurologic: Alert and oriented X 3, normal strength and tone. Normal symmetric reflexes. No tremor    Assessment and Plan     1. Uncontrolled type 2 diabetes mellitus with hyperglycemia, with long-term current use of insulin        Lab Results   Component Value Date    HGBA1C 8.5 (H) 11/22/2017    HGBA1C 9.1 (H) 08/11/2017    HGBA1C 7.8 (H) 05/03/2017       Current A1C is elevated above goal and trend has been improving compared to prior values..  Continue current DM medication regimen. Recommend home glucometer testing twice a day (pre-breakfast or 2 hrs post-prandial).  Recommend therapeutic lifestyle changes which  include optimizing low carbohydrate diet and aerobic exercise efforts.     - few blood sugar readings since last office visit in the low 100 to mid 100s  - she stopped Levemir due to AM fasting low BS   - currently taking Glipizide XL 20 mg daily and Tradjenta 5 mg daily       2. Mixed hyperlipidemia  Lab Results   Component Value Date    CHOL 232 (H) 11/22/2017    CHOL 186 08/11/2017    CHOL 241 (H) 06/12/2017     Lab Results   Component Value Date    HDL 43 11/22/2017    HDL 40 08/11/2017    HDL 42 06/12/2017     Lab Results   Component Value Date    LDL 158 (H) 11/22/2017    LDL 120 (H) 08/11/2017    LDL 173 (H) 06/12/2017     Lab Results   Component Value Date    TRIG 156 (H) 11/22/2017    TRIG 130 08/11/2017    TRIG 130 06/12/2017     - increasing LDL   - on statin and Zetia   - recommend diet low in saturated fats and patient states she is eating more vegetables and less fatty foods     3. Hx  of CVA   - stable     4. Obesity (BMI 33-33.9)  - recommend calorie restricted diet and optimize carb controlled diet  - increase physical activity as tolerated     5. Hypokalemia  - normal K level most recent labs    - BMP     6. Hypertension  - BP is well controlled on the current meds.    RTC in 2 months    Bynum Bellows MSN FNP-BC  [facility]

## 2018-01-03 NOTE — Patient Instructions (Signed)
Check BS twice daily: fasting and 2 hours after dinner         A1c* < 7.0%   Fasting Glucose Level 70-130 mg/dl    2 hours After Meal Glucose Level < 180 mg/dl

## 2018-01-17 ENCOUNTER — Ambulatory Visit (INDEPENDENT_AMBULATORY_CARE_PROVIDER_SITE_OTHER): Payer: No Typology Code available for payment source | Admitting: Internal Medicine

## 2018-01-17 ENCOUNTER — Encounter (INDEPENDENT_AMBULATORY_CARE_PROVIDER_SITE_OTHER): Payer: Self-pay | Admitting: Internal Medicine

## 2018-01-17 VITALS — BP 118/81 | HR 71 | Temp 98.0°F | Resp 14 | Wt 205.0 lb

## 2018-01-17 DIAGNOSIS — E782 Mixed hyperlipidemia: Secondary | ICD-10-CM

## 2018-01-17 DIAGNOSIS — I639 Cerebral infarction, unspecified: Secondary | ICD-10-CM

## 2018-01-17 DIAGNOSIS — E1165 Type 2 diabetes mellitus with hyperglycemia: Secondary | ICD-10-CM

## 2018-01-17 DIAGNOSIS — G629 Polyneuropathy, unspecified: Secondary | ICD-10-CM

## 2018-01-17 DIAGNOSIS — I10 Essential (primary) hypertension: Secondary | ICD-10-CM

## 2018-01-17 NOTE — Progress Notes (Signed)
Subjective:      Date: 01/17/2018 11:26 AM   Patient ID: Emily Rowe is a 67 y.o. female.    Chief Complaint:  Chief Complaint   Patient presents with   . Establish Care     referred by Dr. Newt Lukes       HPI:  HPI     Referred by Dr Maeola Harman       Visit type: follow-up - type II DM   Diagnosed: over 5 years ago  Lab Results   Component Value Date    HGBA1C 8.5 (H) 11/22/2017     Type: Insulin requiring  Complications: hyperglycemia  Control: inadequate control  Comorbid illness: hyperlipidemia  Last follow-up: a month ago with Dr. Maeola Harman   Diabetes Medication Regimen: Glipizide XL 20 mg daily , Tradjenta 5 mg daily , Levemir 28 units at bedtime (stopped taking Levemir for 1 month now)   Unable to tolerate Metformin due to diarrhea, unable to tolerate Victoza due to n/v, unable to tolerate Invokana. Taking potassium pill daily along with eating sweet potato and avocado everyday.  Medication effectiveness/adherence: working well and stopped taking Levemir   Medication side effects: none  Interval events: no interval events   Interval symptoms: none  Patient denies: no other symptoms including chest pain, shortness of breath, hypoglycemic episodes, polydipsia, polyuria, headaches, dizziness, or abdominal pain  Home glucose readings: download: few BS readings since last office visit:  Fasting 142;  After lunch 160, 113  Daily basal insulin dosing: Levemir (stopped taking 1 month ago)   Daily bolus insulin dosing: non-insulin requiring  Diabetic screening: yesterday, January 02, 2018, no known retinopathy  Podiatric assessment: admits LE burning/tingling due to hx of stroke   Exercise: walks occasionally   Diet: moderate compliance with recommended diet; eats more vegetables and protein   Response to therapy: with hemoglobin A1C above goal  A1c trend: improving     Hx of CVA, with residual right sided weakness and antalgic gait, no cognitive deficits.     Additional concerns: In addition, pt has  hyperlipidemia and is compliant with lipid therapy.  Pt denies side effects of lipid therapy - specifically denies myalgia., In addition, pt has stable essential HTN with no evidence of CHF or proteinuria.  The patient is compliant with anti-hypertensive therapy and denies side effects to therapy.  Pt denies CP, SOB, dizziness, orthopnea, PND or edema.     Reports severe hypokalemia which required ER visit; had been off lisinopril at that time.    Office Visit on 01/03/2018   Component Date Value Ref Range Status   . Glucose 01/03/2018 161* 70 - 100 mg/dL Final    Comment: ADA guidelines for diabetes mellitus:  Fasting:  Equal to or greater than 126 mg/dL  Random:   Equal to or greater than 200 mg/dL     . BUN 01/03/2018 17.0  7.0 - 19.0 mg/dL Final   . Creatinine 16/07/9603 0.8  0.4 - 1.5 mg/dL Final   . Calcium 54/06/8118 9.6  8.5 - 10.5 mg/dL Final   . Sodium 14/78/2956 138  136 - 145 mEq/L Final   . Potassium 01/03/2018 3.5  3.5 - 5.1 mEq/L Final   . Chloride 01/03/2018 99* 100 - 111 mEq/L Final   . CO2 01/03/2018 27  21 - 29 mEq/L Final   . Hemolysis Index 01/03/2018 6  0 - 18 Final   . EGFR 01/03/2018 >60.0   Final    Comment: Disease  State Reference Ranges:    Chronic Kidney Disease; < 60 ml/min/1.73 sq.m    Kidney Failure; < 15 ml/min/1.73 sq.m    [Calculated using IDMS-Traceable MDRD equation (based on    gender, age and black vs. non-black race) recommended by    [employer] Kidney Disease Education Program. No data    available for non-white, non-black race.]  GFR estimates are unreliable in patients with:    Rapidly changing kidney function or recent dialysis,    extreme age, body size or body composition(obesity,    severe malnutrition). Abnormal muscle mass (limb    amputation, muscle wasting). In these patients,    alternative determinations of GFR should be obtained.         Problem List:  Patient Active Problem List   Diagnosis   . Uncontrolled type 2 diabetes mellitus with hyperglycemia   . Mixed  hyperlipidemia   . Benign hypertension   . Long-term insulin use   . Obesity (BMI 30-39.9)       Current Medications:  Outpatient Prescriptions Marked as Taking for the 01/17/18 encounter (Office Visit) with Morrison Old, MD   Medication Sig Dispense Refill   . ezetimibe (ZETIA) 10 MG tablet Take 1 tablet (10 mg total) by mouth daily. 90 tablet 1   . glipiZIDE (GLUCOTROL) 10 MG 24 hr tablet TAKE 2 TABLETS BY MOUTH EVERY DAY WITH BREAKFAST 180 tablet 1   . glucose blood (ACCU-CHEK SMARTVIEW) test strip Use as instructed to check blood sugar twice daily DIAGNOSIS E11.65 ACCU-CHEK SMARTVIEW 200 each 1   . Insulin Pen Needle (PEN NEEDLES) 32G X 4 MM Misc 1 Unit by Does not apply route daily. 100 each 1   . LEVEMIR FLEXPEN 100 UNIT/ML injection pen Inject 32 Units into the skin nightly. (Patient taking differently: Inject 28 Units into the skin nightly.    ) 30 mL 1   . Linagliptin (TRADJENTA) 5 MG Tab Take 1 tablet (5 mg total) by mouth daily. 90 tablet 1   . lisinopril (PRINIVIL,ZESTRIL) 40 MG tablet Take 1 tablet (40 mg total) by mouth daily. 90 tablet 3   . metFORMIN (GLUCOPHAGE XR) 500 MG 24 hr tablet Take 2 tablets (1,000 mg total) by mouth daily. 180 tablet 1   . NON FORMULARY Levanlodipino besilato 2.5     . NON FORMULARY Diupress 2.5 mg daily     . simvastatin (ZOCOR) 40 MG tablet Take 1 tablet (40 mg total) by mouth nightly. 90 tablet 1       Allergies:  Allergies   Allergen Reactions   . Codeine Nausea And Vomiting   . Crestor [Rosuvastatin] Rash     D/c due to severe rash and higher blood sugars   . Glucosamine Hives and Nausea And Vomiting   . Latex Rash   . Gabapentin      Dizziness and rash, irregular heart rate   . Flaxseed [Linseed Oil] Rash   . Omega 3 [Fish Oil] Rash   . Simvastatin Rash     Patients reports able to tolerate medication and is currently taking        Past Medical History:  Past Medical History:   Diagnosis Date   . Depression    . Diabetes    . Hyperlipemia    . Hypertensive disorder    .  Hypokalemia    . Neuropathy    . Stroke 2012   . TIA (transient ischemic attack)        Past Surgical  History:  Past Surgical History:   Procedure Laterality Date   . HYSTERECTOMY     . REDUCTION MAMMAPLASTY     . TONSILLECTOMY     . urinary bladder         Family History:  Family History   Problem Relation Age of Onset   . Diabetes Mother    . Diabetes Maternal Grandmother        Social History:  Social History     Social History   . Marital status: Married     Spouse name: N/A   . Number of children: N/A   . Years of education: N/A     Occupational History   . Not on file.     Social History Main Topics   . Smoking status: Never Smoker   . Smokeless tobacco: Never Used   . Alcohol use No   . Drug use: No   . Sexual activity: Not on file      Comment: married, not working ,     Other Topics Concern   . Not on file     Social History Narrative   . No narrative on file       The following sections were reviewed this encounter by the provider:   Tobacco  Allergies  Meds  Problems  Med Hx  Surg Hx  Fam Hx  Soc Hx          Vitals:  BP 118/81   Pulse 71   Temp 98 F (36.7 C) (Oral)   Resp 14   Wt 93 kg (205 lb)   SpO2 96%   BMI 34.11 kg/m         ROS:  General/Constitutional:   Denies Chills. Denies Fatigue. Denies Fever.   Ophthalmologic:   Denies Blurred vision. Denies Eye Pain.   ENT:   Denies Nasal Discharge. Denies Ear pain. Denies Sinus pain.   Endocrine:   Denies Polydipsia. Denies Polyuria.   Respiratory:   Denies Cough. Denies Orthopnea. Denies Shortness of breath. Denies Wheezing.   Cardiovascular:   Denies Chest pain. Denies Chest pain with exertion. Denies Leg Claudication. Denies  Palpitations. Denies Swelling in hands/feet.   Gastrointestinal:   Denies Abdominal pain. Denies Blood in stool. Denies Constipation. Denies Diarrhea.  Denies Heartburn. Denies Nausea. Denies Vomiting.   Genitourinary:   Denies Blood in urine. Denies Frequent urination. Denies Painful urination.   Musculoskeletal:    Denies Leg cramps. Denies Muscle aches.   Skin:   Denies Skin lesion(s).   Neurologic:   Denies Dizziness. Denies Gait abnormality. Denies Headache.     Objective:       Physical Exam:  General Examination:   GENERAL APPEARANCE: alert, in no acute distress, well developed, well nourished,  oriented to time, place, and person.   NOSE: normal nasal mucosa.   ORAL CAVITY: normal oropharynx, normal lips, mucosa moist.   THROAT: normal appearance, clear.   NECK/THYROID: neck supple, no carotid bruit, carotid pulse 2+ bilaterally, no cervical  lymphadenopathy, no neck mass palpated, no jugular venous distention, no  thyromegaly.   HEART: S1, S2 normal, no murmurs, rubs, gallops, regular rate and rhythm.   LUNGS: normal effort / no distress, normal breath sounds, clear to auscultation b bilaterally, no wheezes, rales, rhonchi.   EXTREMITIES: no clubbing, cyanosis, or edema B/L.   PERIPHERAL PULSES: DP/PT pulses 2+ bilateral , No evidence of loss of hair growth  B/L.   PODIATRIC: Inspection: B/L feet - normal appearance,  normal coloration, no fissures or  macerations, No evidence of nail dystrophy, No evidence of paronychia, Normal  skin pigmentation / coloration, Monofilament testing demonstrates decreased sensation B/L feet.          Assessment/Plan:       1. Uncontrolled type 2 diabetes mellitus with hyperglycemia  Recommend decreasing the amount of carbohydrates (such as pasta, breads, cereals, rice, and other grains), added sugars (e.g., cakes, cookies, and beverages) and increasing fiber intake (fresh fruits, vegetable, oatmeal). Lifestyle modifications for weight loss such as aerobic exercise at least 30 minutes daily for atleast 4 days a week is important.      F/u 3 months   UTD ophtha    2. Mixed hyperlipidemia  Lifestyle and dietary changes discussed in detail  .  The patient is advised to begin progressive daily aerobic exercise program and follow a low fat, low cholesterol diet.    3. Benign  hypertension    Pt stable on current medication regimen, no concerns. Continue current medication        Morrison Old, MD

## 2018-02-20 ENCOUNTER — Ambulatory Visit (INDEPENDENT_AMBULATORY_CARE_PROVIDER_SITE_OTHER): Payer: Medicare Other | Admitting: Internal Medicine

## 2018-02-22 ENCOUNTER — Encounter (INDEPENDENT_AMBULATORY_CARE_PROVIDER_SITE_OTHER): Payer: Self-pay | Admitting: Internal Medicine

## 2018-02-22 ENCOUNTER — Ambulatory Visit (INDEPENDENT_AMBULATORY_CARE_PROVIDER_SITE_OTHER): Payer: No Typology Code available for payment source | Admitting: Internal Medicine

## 2018-02-22 VITALS — BP 133/80 | HR 81 | Resp 16 | Ht 65.0 in | Wt 205.0 lb

## 2018-02-22 DIAGNOSIS — E1165 Type 2 diabetes mellitus with hyperglycemia: Secondary | ICD-10-CM

## 2018-02-22 DIAGNOSIS — E782 Mixed hyperlipidemia: Secondary | ICD-10-CM

## 2018-02-22 DIAGNOSIS — Z8673 Personal history of transient ischemic attack (TIA), and cerebral infarction without residual deficits: Secondary | ICD-10-CM

## 2018-02-22 DIAGNOSIS — I10 Essential (primary) hypertension: Secondary | ICD-10-CM

## 2018-02-22 DIAGNOSIS — Z6834 Body mass index (BMI) 34.0-34.9, adult: Secondary | ICD-10-CM

## 2018-02-22 DIAGNOSIS — E669 Obesity, unspecified: Secondary | ICD-10-CM

## 2018-02-22 NOTE — Progress Notes (Signed)
Subjective:      Date: 02/22/2018 3:55 PM   Patient ID: Emily Rowe is a 67 y.o. female.    Chief Complaint:  Chief Complaint   Patient presents with   . Uncontrolled type 2 diabetes mellitus with hyperglycemia       HPI  Visit type: follow-up - type II DM   Diagnosed: over 5 years ago  Type: Insulin requiring  Complications: hyperglycemia  Control: inadequate control  Comorbid illness: hyperlipidemia  Last follow-up: 2 months ago  with NP Bynum Bellows   Diabetes Medication Regimen: Glipizide XL 20 mg daily , Tradjenta 5 mg daily , Levemir 32 units at bedtime (stopped taking Levemir for 1 month now).  Unable to tolerate Metformin due to diarrhea, unable to tolerate Victoza due to n/v, unable to tolerate Invokana. Taking potassium pill daily along with eating sweet potato and avocado everyday.  Medication effectiveness/adherence: working well and stopped taking Levemir   Medication side effects: none  Interval events: no interval events   Interval symptoms: none  Patient denies: no other symptoms including chest pain, shortness of breath, hypoglycemic episodes, polydipsia, polyuria, headaches, dizziness, or abdominal pain  Home glucose readings:  FBG 100-150, 2H PPBG 140-160's  Daily basal insulin dosing: Levemir (stopped taking 1 month ago)   Daily bolus insulin dosing: non-insulin requiring  Diabetic screening: yesterday, January 02, 2018, no known retinopathy  Podiatric assessment: admits LE burning/tingling due to hx of stroke   Exercise: walks occasionally   Diet: moderate compliance with recommended diet; eats more vegetables and protein   Response to therapy: with hemoglobin A1C above goal  A1c trend: improving     Lab Results   Component Value Date    HGBA1C 8.5 (H) 11/22/2017    HGBA1C 9.1 (H) 08/11/2017    HGBA1C 7.8 (H) 05/03/2017     Trend is decreasing  Additional concerns: In addition, pt has hyperlipidemia and is compliant with lipid therapy.  Pt denies side effects of lipid  therapy - specifically denies myalgia., In addition, pt has stable essential HTN with no evidence of CHF or proteinuria.  The patient is compliant with anti-hypertensive therapy and denies side effects to therapy.  Pt denies CP, SOB, dizziness, orthopnea, PND or edema.     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     Reports severe hypokalemia which required ER visit; had been off lisinopril at that time.  Notes allergic reaction soon after taking crestor.    Came back from her vacation in [place] 2 days ago, notes blood sugars as well as blood pressure while in [place] have been better. Stopped Levemir, has been having low BS recently.        Problem List:  Patient Active Problem List   Diagnosis   . Uncontrolled type 2 diabetes mellitus with hyperglycemia   . Mixed hyperlipidemia   . Benign hypertension   . Long-term insulin use   . Obesity (BMI 30-39.9)   . Neuropathy   . Stroke       Current Medications:  Current Outpatient Prescriptions   Medication Sig Dispense Refill   . ezetimibe (ZETIA) 10 MG tablet Take 1 tablet (10 mg total) by mouth daily. 90 tablet 1   . glipiZIDE (GLUCOTROL) 10 MG 24 hr tablet TAKE 2 TABLETS BY MOUTH EVERY DAY WITH BREAKFAST 180 tablet 1   . glucose blood (ACCU-CHEK SMARTVIEW) test strip Use as instructed to check blood sugar  twice daily DIAGNOSIS E11.65 ACCU-CHEK SMARTVIEW 200 each 1   . Insulin Pen Needle (PEN NEEDLES) 32G X 4 MM Misc 1 Unit by Does not apply route daily. 100 each 1   . LEVEMIR FLEXPEN 100 UNIT/ML injection pen Inject 32 Units into the skin nightly. 30 mL 1   . Linagliptin (TRADJENTA) 5 MG Tab Take 1 tablet (5 mg total) by mouth daily. 90 tablet 1   . lisinopril (PRINIVIL,ZESTRIL) 40 MG tablet Take 1 tablet (40 mg total) by mouth daily. 90 tablet 3   . NON FORMULARY Levanlodipino besilato 2.5     . NON FORMULARY Diupress 2.5 mg daily     . simvastatin (ZOCOR) 40 MG tablet Take 1 tablet (40 mg total) by mouth  nightly. 90 tablet 1     No current facility-administered medications for this visit.        Allergies:  Allergies   Allergen Reactions   . Codeine Nausea And Vomiting   . Crestor [Rosuvastatin] Rash     D/c due to severe rash and higher blood sugars   . Glucosamine Hives and Nausea And Vomiting   . Latex Rash   . Gabapentin      Dizziness and rash, irregular heart rate   . Flaxseed [Linseed Oil] Rash   . Omega 3 [Fish Oil] Rash   . Simvastatin Rash     Patients reports able to tolerate medication and is currently taking        Past Medical History:  Past Medical History:   Diagnosis Date   . Depression    . Diabetes    . Hyperlipemia    . Hypertensive disorder    . Hypokalemia    . Neuropathy    . Stroke 2012   . TIA (transient ischemic attack)        Past Surgical History:  Past Surgical History:   Procedure Laterality Date   . HYSTERECTOMY     . REDUCTION MAMMAPLASTY     . TONSILLECTOMY     . urinary bladder         Family History:  Family History   Problem Relation Age of Onset   . Diabetes Mother    . Diabetes Maternal Grandmother        Social History:  Social History     Social History   . Marital status: Married     Spouse name: N/A   . Number of children: N/A   . Years of education: N/A     Occupational History   . Not on file.     Social History Main Topics   . Smoking status: Never Smoker   . Smokeless tobacco: Never Used   . Alcohol use No   . Drug use: No   . Sexual activity: Not on file      Comment: married, not working ,     Other Topics Concern   . Not on file     Social History Narrative   . No narrative on file       The following sections were reviewed this encounter by the provider:       ROS:  A complete 12 point ROS was obtained. Pertinent positives and negatives as noted in HPI. All other systems are negative.      Vitals:  BP 133/80   Pulse 81   Resp 16   Ht 1.651 m (5\' 5" )   Wt 93 kg (205 lb)   SpO2 95%  BMI 34.11 kg/m    Wt Readings from Last 3 Encounters:   02/22/18 93 kg (205 lb)    01/17/18 93 kg (205 lb)   01/03/18 92 kg (202 lb 12.8 oz)       Physical Examination     General appearance: alert, appears stated age, cooperative and no distress  Eyes: conjunctivae/corneas clear. PERRL, EOM's intact.   Neck: no cervical lymphadenopathy, no carotid bruit,  supple, symmetrical, trachea midline   Thyroid:normal sized on inspection; non tender to palpation and no discrete nodules palpated  Lungs: clear to auscultation bilaterally  Heart: regular rate and rhythm, S1, S2 normal, no murmur, click, rub or gallop  Extremities: extremities normal, atraumatic, no cyanosis or edema  Foot Exam: monofilament testing of plantar aspect of first toe and metatarsal joints (10g monofilament) intact bilaterally, no fissures, maceration, ulceration or lesions bilaterally, normal hair growth bilaterally, normal pigmentation bilaterally and vibratory perception (128 Hz tuning fork) applied to first toe intact bilaterally; Pedal pulses 2+ bilaterally  Skin: Skin color, texture, turgor normal. No rashes or lesions.  Neurologic: Alert and oriented X 3, normal strength and tone. Normal symmetric reflexes. No tremor    Assessment and Plan     1. Uncontrolled type 2 diabetes mellitus with hyperglycemia, with long-term current use of insulin        Lab Results   Component Value Date    HGBA1C 8.5 (H) 11/22/2017    HGBA1C 9.1 (H) 08/11/2017    HGBA1C 7.8 (H) 05/03/2017       Current A1C is elevated above goal and trend has been improving compared to prior values..  Continue current DM medication regimen. Recommend home glucometer testing twice a day (pre-breakfast or 2 hrs post-prandial).  Recommend therapeutic lifestyle changes which include optimizing low carbohydrate diet and aerobic exercise efforts.     - few blood sugar readings since last office visit in the low 100 to mid 100s  - she stopped Levemir due to AM fasting low BS   - currently taking Glipizide XL 20 mg daily and Tradjenta 5 mg daily       2. Mixed  hyperlipidemia  Lab Results   Component Value Date    CHOL 232 (H) 11/22/2017    CHOL 186 08/11/2017    CHOL 241 (H) 06/12/2017     Lab Results   Component Value Date    HDL 43 11/22/2017    HDL 40 08/11/2017    HDL 42 06/12/2017     Lab Results   Component Value Date    LDL 158 (H) 11/22/2017    LDL 120 (H) 08/11/2017    LDL 173 (H) 06/12/2017     Lab Results   Component Value Date    TRIG 156 (H) 11/22/2017    TRIG 130 08/11/2017    TRIG 130 06/12/2017     LDL 158, total cholesterol 232, TG 156.    - recommend diet low in saturated fats and patient states she is eating more vegetables and less fatty foods     3. Hx of CVA   - stable     4. Obesity (BMI 34-34.9)  - recommend calorie restricted diet and optimize carb controlled diet  - increase physical activity as tolerated     5. Hypertension  BP is well controlled on the current meds.    RTC in 3 months    Tanya Nones MD MPH  Endocrinologist, [facility]

## 2018-02-27 ENCOUNTER — Other Ambulatory Visit: Payer: No Typology Code available for payment source

## 2018-03-21 ENCOUNTER — Encounter (INDEPENDENT_AMBULATORY_CARE_PROVIDER_SITE_OTHER): Payer: Self-pay | Admitting: Family Medicine

## 2018-04-11 NOTE — Progress Notes (Signed)
Review at follow up visit

## 2018-04-30 ENCOUNTER — Other Ambulatory Visit (INDEPENDENT_AMBULATORY_CARE_PROVIDER_SITE_OTHER): Payer: Self-pay | Admitting: Internal Medicine

## 2018-04-30 DIAGNOSIS — E1165 Type 2 diabetes mellitus with hyperglycemia: Secondary | ICD-10-CM

## 2018-05-21 ENCOUNTER — Ambulatory Visit (INDEPENDENT_AMBULATORY_CARE_PROVIDER_SITE_OTHER): Payer: No Typology Code available for payment source | Admitting: Internal Medicine

## 2018-05-21 ENCOUNTER — Other Ambulatory Visit (FREE_STANDING_LABORATORY_FACILITY): Payer: No Typology Code available for payment source

## 2018-05-21 ENCOUNTER — Other Ambulatory Visit: Payer: No Typology Code available for payment source

## 2018-05-21 ENCOUNTER — Encounter (INDEPENDENT_AMBULATORY_CARE_PROVIDER_SITE_OTHER): Payer: Self-pay | Admitting: Internal Medicine

## 2018-05-21 VITALS — BP 144/70 | HR 70 | Ht 65.0 in | Wt 201.0 lb

## 2018-05-21 DIAGNOSIS — Z6833 Body mass index (BMI) 33.0-33.9, adult: Secondary | ICD-10-CM

## 2018-05-21 DIAGNOSIS — E1165 Type 2 diabetes mellitus with hyperglycemia: Secondary | ICD-10-CM

## 2018-05-21 DIAGNOSIS — E782 Mixed hyperlipidemia: Secondary | ICD-10-CM

## 2018-05-21 DIAGNOSIS — E669 Obesity, unspecified: Secondary | ICD-10-CM

## 2018-05-21 DIAGNOSIS — I10 Essential (primary) hypertension: Secondary | ICD-10-CM

## 2018-05-21 LAB — LIPID PANEL
Cholesterol / HDL Ratio: 5.3
Cholesterol: 234 mg/dL — ABNORMAL HIGH (ref 0–199)
HDL: 44 mg/dL (ref 40–9999)
LDL Calculated: 153 mg/dL — ABNORMAL HIGH (ref 0–99)
Triglycerides: 184 mg/dL — ABNORMAL HIGH (ref 34–149)
VLDL Calculated: 37 mg/dL (ref 10–40)

## 2018-05-21 LAB — COMPREHENSIVE METABOLIC PANEL
ALT: 22 U/L (ref 0–55)
AST (SGOT): 21 U/L (ref 5–34)
Albumin/Globulin Ratio: 1.1 (ref 0.9–2.2)
Albumin: 4 g/dL (ref 3.5–5.0)
Alkaline Phosphatase: 86 U/L (ref 37–106)
BUN: 11 mg/dL (ref 7.0–19.0)
Bilirubin, Total: 0.4 mg/dL (ref 0.2–1.2)
CO2: 30 mEq/L — ABNORMAL HIGH (ref 21–29)
Calcium: 10.1 mg/dL (ref 8.5–10.5)
Chloride: 98 mEq/L — ABNORMAL LOW (ref 100–111)
Creatinine: 1 mg/dL (ref 0.4–1.5)
Globulin: 3.8 g/dL — ABNORMAL HIGH (ref 2.0–3.7)
Glucose: 237 mg/dL — ABNORMAL HIGH (ref 70–100)
Potassium: 4.4 mEq/L (ref 3.5–5.1)
Protein, Total: 7.8 g/dL (ref 6.0–8.3)
Sodium: 138 mEq/L (ref 136–145)

## 2018-05-21 LAB — MICROALBUMIN, RANDOM URINE
Urine Creatinine, Random: 95.1 mg/dL
Urine Microalbumin, Random: 6 (ref 0.0–30.0)
Urine Microalbumin/Creatinine Ratio: 6 ug/mg (ref 0–30)

## 2018-05-21 LAB — HEMOLYSIS INDEX: Hemolysis Index: 27 — ABNORMAL HIGH (ref 0–18)

## 2018-05-21 LAB — HEMOGLOBIN A1C
Average Estimated Glucose: 185.8 mg/dL
Hemoglobin A1C: 8.1 % — ABNORMAL HIGH (ref 4.6–5.9)

## 2018-05-21 LAB — GFR: EGFR: 55.7

## 2018-05-21 LAB — TSH: TSH: 1.4 u[IU]/mL (ref 0.35–4.94)

## 2018-05-21 NOTE — Progress Notes (Signed)
Subjective:      Date: 05/21/2018 11:21 AM   Patient ID: Emily Rowe is a 67 y.o. female.    Chief Complaint:  Chief Complaint   Patient presents with   . Uncontrolled type 2 diabetes mellitus with hyperglycemia       HPI  Visit type: follow-up - type II DM   Diagnosed: over 5 years ago  Type: Insulin requiring  Complications: hyperglycemia  Control: inadequate control  Comorbid illness: hyperlipidemia  Last follow-up: 2 months ago  with NP Bynum Bellows   Diabetes Medication Regimen: Glipizide XL 20 mg daily , Tradjenta 5 mg daily , Levemir 25 units at bedtime (stopped taking Levemir for 1 month now).  Unable to tolerate Metformin due to diarrhea, unable to tolerate Victoza due to n/v, unable to tolerate Invokana. Taking potassium pill daily along with eating sweet potato and avocado everyday.  Medication effectiveness/adherence: working well and stopped taking Levemir   Medication side effects: none  Interval events: no interval events   Interval symptoms: none  Patient denies: no other symptoms including chest pain, shortness of breath, hypoglycemic episodes, polydipsia, polyuria, headaches, dizziness, or abdominal pain  Home glucose readings:  FBG 100-150, 2H PPBG 140-160's  Daily basal insulin dosing: Levemir (stopped taking 1 month ago)   Daily bolus insulin dosing: non-insulin requiring  Diabetic screening: yesterday, January 02, 2018, no known retinopathy  Podiatric assessment: admits LE burning/tingling due to hx of stroke   Exercise: walks occasionally   Diet: moderate compliance with recommended diet; eats more vegetables and protein   Response to therapy: with hemoglobin A1C above goal  A1c trend: improving     Lab Results   Component Value Date    HGBA1C 8.5 (H) 11/22/2017    HGBA1C 9.1 (H) 08/11/2017    HGBA1C 7.8 (H) 05/03/2017     Trend is decreasing  Additional concerns: In addition, pt has hyperlipidemia and is compliant with lipid therapy.  Pt denies side effects of lipid  therapy - specifically denies myalgia., In addition, pt has stable essential HTN with no evidence of CHF or proteinuria.  The patient is compliant with anti-hypertensive therapy and denies side effects to therapy.  Pt denies CP, SOB, dizziness, orthopnea, PND or edema.     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     H/o severe hypokalemia which required ER visit; had been off lisinopril at that time.  Notes allergic reaction soon after taking crestor.      Interval history: Reports recent fluctuation in BP.Seen by neurologist. Told that she could have panic attacks. Seen by psychiatrist and anxiety disorder was ruled out. Reports episodes of tingling and weakness of extremities. + complaints of headaches, dizziness.      Problem List:  Patient Active Problem List   Diagnosis   . Uncontrolled type 2 diabetes mellitus with hyperglycemia   . Mixed hyperlipidemia   . Benign hypertension   . Long-term insulin use   . Obesity (BMI 30-39.9)   . Neuropathy   . Stroke       Current Medications:  Current Outpatient Prescriptions   Medication Sig Dispense Refill   . glipiZIDE (GLUCOTROL) 10 MG 24 hr tablet TAKE 2 TABLETS BY MOUTH EVERY DAY WITH BREAKFAST 180 tablet 1   . glucose blood (ACCU-CHEK SMARTVIEW) test strip Use as instructed to check blood sugar twice daily DIAGNOSIS E11.65 ACCU-CHEK SMARTVIEW 200 each 1   . Insulin Pen Needle (PEN  NEEDLES) 32G X 4 MM Misc 1 Unit by Does not apply route daily. 100 each 1   . LEVEMIR FLEXPEN 100 UNIT/ML injection pen Inject 32 Units into the skin nightly. 30 mL 1   . Linagliptin (TRADJENTA) 5 MG Tab Take 1 tablet (5 mg total) by mouth daily. 90 tablet 1   . lisinopril (PRINIVIL,ZESTRIL) 40 MG tablet Take 1 tablet (40 mg total) by mouth daily. 90 tablet 3   . NON FORMULARY Levanlodipino besilato 2.5     . NON FORMULARY Diupress 2.5 mg daily     . simvastatin (ZOCOR) 40 MG tablet Take 1 tablet (40 mg total) by mouth nightly. (Patient  taking differently: Take 40 mg by mouth nightly    ) 90 tablet 1   . ezetimibe (ZETIA) 10 MG tablet Take 1 tablet (10 mg total) by mouth daily. 90 tablet 1   . metFORMIN (GLUCOPHAGE-XR) 500 MG 24 hr tablet TAKE 2 TABLETS BY MOUTH EVERY DAY 180 tablet 1     No current facility-administered medications for this visit.        Allergies:  Allergies   Allergen Reactions   . Codeine Nausea And Vomiting   . Crestor [Rosuvastatin] Rash     D/c due to severe rash and higher blood sugars   . Glucosamine Hives and Nausea And Vomiting   . Latex Rash   . Gabapentin      Dizziness and rash, irregular heart rate   . Flaxseed [Linseed Oil] Rash   . Omega 3 [Fish Oil] Rash   . Simvastatin Rash     Patients reports able to tolerate medication and is currently taking        Past Medical History:  Past Medical History:   Diagnosis Date   . Depression    . Diabetes    . Hyperlipemia    . Hypertensive disorder    . Hypokalemia    . Neuropathy    . Stroke 2012   . TIA (transient ischemic attack)        Past Surgical History:  Past Surgical History:   Procedure Laterality Date   . HYSTERECTOMY     . REDUCTION MAMMAPLASTY     . TONSILLECTOMY     . urinary bladder         Family History:  Family History   Problem Relation Age of Onset   . Diabetes Mother    . Diabetes Maternal Grandmother        Social History:  Social History     Social History   . Marital status: Married     Spouse name: N/A   . Number of children: N/A   . Years of education: N/A     Occupational History   . Not on file.     Social History Main Topics   . Smoking status: Never Smoker   . Smokeless tobacco: Never Used   . Alcohol use No   . Drug use: No   . Sexual activity: Not on file      Comment: married, not working ,     Other Topics Concern   . Not on file     Social History Narrative   . No narrative on file       The following sections were reviewed this encounter by the provider:   Allergies  Meds  Problems        ROS:  A complete 12 point ROS was obtained.  Pertinent positives and  negatives as noted in HPI. All other systems are negative.      Vitals:  BP 144/70   Pulse 70   Ht 1.651 m (5\' 5" )   Wt 91.2 kg (201 lb)   SpO2 96%   BMI 33.45 kg/m    Wt Readings from Last 3 Encounters:   05/21/18 91.2 kg (201 lb)   02/22/18 93 kg (205 lb)   01/17/18 93 kg (205 lb)     - 4 lbs since 5/19    Physical Examination     General appearance: alert, appears stated age, cooperative and no distress  Eyes: conjunctivae/corneas clear. PERRL, EOM's intact.   Neck: no cervical lymphadenopathy, no carotid bruit,  supple, symmetrical, trachea midline   Thyroid:normal sized on inspection; non tender to palpation and no discrete nodules palpated  Lungs: clear to auscultation bilaterally  Heart: regular rate and rhythm, S1, S2 normal, no murmur, click, rub or gallop  Abdomen: soft, non-tender; bowel sounds normal; no masses,  no organomegaly  Extremities: extremities normal, atraumatic, no cyanosis or edema  Foot:  Pedal pulses 2+ bilaterally; no lesions or ulcers; sensation intact to monofilament and vibration by TF testing   Skin: Skin color, texture, turgor normal. No rashes or lesions  Neurologic: Alert and oriented X 3, normal strength and tone. Normal symmetric reflexes. No tremor  Foot Exam: monofilament testing of plantar aspect of first toe and metatarsal joints (10g monofilament) intact bilaterally, no fissures, maceration, ulceration bilaterally, normal hair growth bilaterally, normal pigmentation bilaterally and vibratory perception (128 Hz tuning fork) applied to first toe intact bilaterally    Assessment and Plan     1. Uncontrolled type 2 diabetes mellitus with hyperglycemia  Lab Results   Component Value Date    HGBA1C 8.1 (H) 05/21/2018    HGBA1C 8.5 (H) 11/22/2017    HGBA1C 9.1 (H) 08/11/2017       Current A1C is elevated above goal and trend has been improving compared to prior values..  Recommend optimizing low carbohydrate diet efforts and drinking at least 60 ounces  of H20 daily.  Exercise goal - 150 minutes of sustained cardio exercise per week. Recommend home glucometer testing AC and HS.  Retinopathy surveillance is UTD. Podiatry surveillance is UTD. Microalbumin testing is UTD within past year.     Recommend continued Rx with tradjenta, glipizide. Discussed continuation of lower dose of levemir 10 units at bedtime.    - Hemoglobin A1C  - Comprehensive metabolic panel  - Microalbumin, Random Urine  - TSH    2. Benign hypertension  BP is fairly well controlled on the current meds. Reports elements of increase in stress and anxiety.    3. Mixed hyperlipidemia  On statins and zetia; well tolerated. Recommend consistent compliance and non-fasting labs below were reviewed.  Lab Results   Component Value Date    CHOL 234 (H) 05/21/2018    CHOL 232 (H) 11/22/2017    CHOL 186 08/11/2017     Lab Results   Component Value Date    HDL 44 05/21/2018    HDL 43 11/22/2017    HDL 40 08/11/2017     Lab Results   Component Value Date    LDL 153 (H) 05/21/2018    LDL 158 (H) 11/22/2017    LDL 120 (H) 08/11/2017     Lab Results   Component Value Date    TRIG 184 (H) 05/21/2018    TRIG 156 (H) 11/22/2017    TRIG 130 08/11/2017     -  Lipid panel    4. Obesity (BMI 30-39.9)  Recommend calorie restricted diet and exercise for weight loss      5. Body mass index 33.0-33.9, adult      RTC in 3 months    Tanya Nones MD MPH  Endocrinologist, [facility]

## 2018-05-30 ENCOUNTER — Ambulatory Visit (INDEPENDENT_AMBULATORY_CARE_PROVIDER_SITE_OTHER): Payer: Self-pay | Admitting: Cardiovascular Disease

## 2018-06-01 ENCOUNTER — Other Ambulatory Visit (INDEPENDENT_AMBULATORY_CARE_PROVIDER_SITE_OTHER): Payer: Self-pay | Admitting: Internal Medicine

## 2018-06-01 DIAGNOSIS — E1165 Type 2 diabetes mellitus with hyperglycemia: Secondary | ICD-10-CM

## 2018-06-02 ENCOUNTER — Other Ambulatory Visit (INDEPENDENT_AMBULATORY_CARE_PROVIDER_SITE_OTHER): Payer: Self-pay | Admitting: Internal Medicine

## 2018-06-02 DIAGNOSIS — E119 Type 2 diabetes mellitus without complications: Secondary | ICD-10-CM

## 2018-06-03 NOTE — Progress Notes (Signed)
Lab results are in the normal range other than the following:  1. A1c/ 3 month estimate of avg sugars is in the range of uncontrolled diabetes; A1c has gradually decreased from 9.1% in 10/18. Target A1c is less than 7%.  2. Total cholesterol, triglycerides, LDL/ bad cholesterol levels are high. Recommend compliance with zetia, zocor and diet low in saturated fat.  3. Evidence of mild drop in kidney function- recommend increasing fluid intake.

## 2018-06-04 ENCOUNTER — Telehealth (INDEPENDENT_AMBULATORY_CARE_PROVIDER_SITE_OTHER): Payer: Self-pay | Admitting: Internal Medicine

## 2018-06-04 NOTE — Telephone Encounter (Signed)
-----   Message from Tanya Nones, MD sent at 06/03/2018 12:38 AM EDT -----  Lab results are in the normal range other than the following:  1. A1c/ 3 month estimate of avg sugars is in the range of uncontrolled diabetes; A1c has gradually decreased from 9.1% in 10/18. Target A1c is less than 7%.  2. Total cholesterol, triglycerides, LDL/ bad cholesterol levels are high. Recommend compliance with zetia, zocor and diet low in saturated fat.  3. Evidence of mild drop in kidney function- recommend increasing fluid intake.

## 2018-06-04 NOTE — Telephone Encounter (Signed)
Patient states she doesn't drink anything but water and drinks a lot during the day.

## 2018-06-04 NOTE — Telephone Encounter (Signed)
This is likely due to high blood sugars from the diabetes.

## 2018-06-05 ENCOUNTER — Encounter (INDEPENDENT_AMBULATORY_CARE_PROVIDER_SITE_OTHER): Payer: Self-pay | Admitting: Internal Medicine

## 2018-06-05 ENCOUNTER — Emergency Department
Admission: EM | Admit: 2018-06-05 | Discharge: 2018-06-06 | Disposition: A | Discharge status: Home / Self Care | Payer: No Typology Code available for payment source | Attending: Emergency Medicine | Admitting: Emergency Medicine

## 2018-06-05 ENCOUNTER — Emergency Department: Payer: No Typology Code available for payment source

## 2018-06-05 DIAGNOSIS — E1140 Type 2 diabetes mellitus with diabetic neuropathy, unspecified: Secondary | ICD-10-CM | POA: Insufficient documentation

## 2018-06-05 DIAGNOSIS — G2581 Restless legs syndrome: Secondary | ICD-10-CM

## 2018-06-05 DIAGNOSIS — I10 Essential (primary) hypertension: Secondary | ICD-10-CM

## 2018-06-05 DIAGNOSIS — Z7984 Long term (current) use of oral hypoglycemic drugs: Secondary | ICD-10-CM | POA: Insufficient documentation

## 2018-06-05 DIAGNOSIS — I69351 Hemiplegia and hemiparesis following cerebral infarction affecting right dominant side: Secondary | ICD-10-CM | POA: Insufficient documentation

## 2018-06-05 DIAGNOSIS — R42 Dizziness and giddiness: Secondary | ICD-10-CM | POA: Insufficient documentation

## 2018-06-05 DIAGNOSIS — R51 Headache: Secondary | ICD-10-CM | POA: Insufficient documentation

## 2018-06-05 DIAGNOSIS — R531 Weakness: Secondary | ICD-10-CM | POA: Insufficient documentation

## 2018-06-05 DIAGNOSIS — R519 Headache, unspecified: Secondary | ICD-10-CM

## 2018-06-05 DIAGNOSIS — Z79899 Other long term (current) drug therapy: Secondary | ICD-10-CM | POA: Insufficient documentation

## 2018-06-05 DIAGNOSIS — E785 Hyperlipidemia, unspecified: Secondary | ICD-10-CM | POA: Insufficient documentation

## 2018-06-05 HISTORY — DX: Anxiety disorder, unspecified: F41.9

## 2018-06-05 LAB — URINALYSIS
Bilirubin, UA: NEGATIVE
Blood, UA: NEGATIVE
Glucose, UA: NEGATIVE
Ketones UA: NEGATIVE
Leukocyte Esterase, UA: NEGATIVE
Nitrite, UA: NEGATIVE
Protein, UR: NEGATIVE
Specific Gravity UA: 1.01 (ref 1.001–1.035)
Urine pH: 7.5 (ref 5.0–8.0)
Urobilinogen, UA: 0.2 mg/dL

## 2018-06-05 LAB — B-TYPE NATRIURETIC PEPTIDE: B-Natriuretic Peptide: 24 pg/mL (ref 0–100)

## 2018-06-05 LAB — COMPREHENSIVE METABOLIC PANEL
ALT: 20 U/L (ref 0–55)
AST (SGOT): 23 U/L (ref 5–34)
Albumin/Globulin Ratio: 1.2 (ref 0.9–2.2)
Albumin: 3.7 g/dL (ref 3.5–5.0)
Alkaline Phosphatase: 80 U/L (ref 37–106)
Anion Gap: 12 (ref 5.0–15.0)
BUN: 13 mg/dL (ref 7.0–19.0)
Bilirubin, Total: 0.2 mg/dL (ref 0.2–1.2)
CO2: 26 mEq/L (ref 22–29)
Calcium: 9.2 mg/dL (ref 8.5–10.5)
Chloride: 105 mEq/L (ref 100–111)
Creatinine: 1 mg/dL (ref 0.6–1.0)
Globulin: 3.1 g/dL (ref 2.0–3.6)
Glucose: 88 mg/dL (ref 70–100)
Potassium: 4.1 mEq/L (ref 3.5–5.1)
Protein, Total: 6.8 g/dL (ref 6.0–8.3)
Sodium: 143 mEq/L (ref 136–145)

## 2018-06-05 LAB — GFR: EGFR: 55.7

## 2018-06-05 LAB — CBC AND DIFFERENTIAL
Absolute NRBC: 0 10*3/uL (ref 0.00–0.00)
Basophils Absolute Automated: 0.03 10*3/uL (ref 0.00–0.08)
Basophils Automated: 0.3 %
Eosinophils Absolute Automated: 0.02 10*3/uL (ref 0.00–0.44)
Eosinophils Automated: 0.2 %
Hematocrit: 36.2 % (ref 34.7–43.7)
Hgb: 11.5 g/dL (ref 11.4–14.8)
Immature Granulocytes Absolute: 0.04 10*3/uL (ref 0.00–0.07)
Immature Granulocytes: 0.5 %
Lymphocytes Absolute Automated: 2.61 10*3/uL (ref 0.42–3.22)
Lymphocytes Automated: 30.3 %
MCH: 27.6 pg (ref 25.1–33.5)
MCHC: 31.8 g/dL (ref 31.5–35.8)
MCV: 86.8 fL (ref 78.0–96.0)
MPV: 13 fL — ABNORMAL HIGH (ref 8.9–12.5)
Monocytes Absolute Automated: 0.51 10*3/uL (ref 0.21–0.85)
Monocytes: 5.9 %
Neutrophils Absolute: 5.39 10*3/uL (ref 1.10–6.33)
Neutrophils: 62.8 %
Nucleated RBC: 0 /100 WBC (ref 0.0–0.0)
Platelets: 158 10*3/uL (ref 142–346)
RBC: 4.17 10*6/uL (ref 3.90–5.10)
RDW: 14 % (ref 11–15)
WBC: 8.6 10*3/uL (ref 3.10–9.50)

## 2018-06-05 LAB — TROPONIN I: Troponin I: <0.01 ng/mL (ref 0.00–0.09)

## 2018-06-05 LAB — LIPASE: Lipase: 14 U/L (ref 8–78)

## 2018-06-05 LAB — PHOSPHORUS: Phosphorus: 2.9 mg/dL (ref 2.3–4.7)

## 2018-06-05 LAB — MAGNESIUM: Magnesium: 1.8 mg/dL (ref 1.6–2.6)

## 2018-06-05 MED ORDER — SODIUM CHLORIDE 0.9 % IV BOLUS
1000.00 mL | Freq: Once | INTRAVENOUS | Status: AC
Start: 2018-06-05 — End: 2018-06-06
  Administered 2018-06-05: 1000 mL via INTRAVENOUS

## 2018-06-05 MED ORDER — MECLIZINE HCL 12.5 MG PO TABS
25.00 mg | ORAL_TABLET | Freq: Once | ORAL | Status: AC
Start: 2018-06-05 — End: 2018-06-05
  Administered 2018-06-05: 25 mg via ORAL
  Filled 2018-06-05: qty 2

## 2018-06-05 MED ORDER — ROPINIROLE HCL 0.25 MG PO TABS
0.25 mg | ORAL_TABLET | Freq: Once | ORAL | Status: DC
Start: 2018-06-05 — End: 2018-06-06

## 2018-06-05 MED ORDER — LORAZEPAM 0.5 MG PO TABS
0.50 mg | ORAL_TABLET | Freq: Once | ORAL | Status: AC
Start: 2018-06-05 — End: 2018-06-05
  Administered 2018-06-05: 0.5 mg via ORAL
  Filled 2018-06-05: qty 1

## 2018-06-05 NOTE — ED Provider Notes (Signed)
EMERGENCY DEPARTMENT NOTE    Physician/Midlevel provider first contact with patient: 06/05/18 2136         HISTORY OF PRESENT ILLNESS   Historian:Patient  Translator Used: No    Chief Complaint:   Chief Complaint   Patient presents with   . Nausea   . Headache   . Generalized weakness      Mechanism of Injury:       67 year old female, history of uncontrolled diabetes type 2, hypertension, stroke with right-sided weakness, presented with concern for having several different complaints.  Patient states that over the past 1 year, she has been having symptoms of headache, generalized malaise, weaknes, dizziness, and diffuse all over body aches.  Patient feels that she has sensation of pain that starts in her lower extremities that progresses up to her head.  Feels lightheaded, dizzy.  She feels nauseated.  She feels her blood pressure goes up and down.  She states that symptoms started this morning, and they have progressively been getting worse.  Does not seem to be very atypical compared to her previous episodes, though she feels like her symptoms have been persistent, which prompted her evaluation in the ER.  Patient states that she was recently restarted on a different antihypertensive.  She was started on lisinopril 5 days ago.  She has been compliant with her medications.  She does have a history of having significant hypokalemia in the past that required hospitalizations.  She is concerned about her electrolytes.  She denies any pain at time of presentation.  She denies any new weakness.  She states her symptoms come and go.  Nothing seems to make the symptoms better or worse.  She was brought in by EMS.      The history is provided by the patient.               MEDICAL HISTORY     Past Medical History:  Past Medical History:   Diagnosis Date   . Anxiety    . Depression    . Diabetes    . Hyperlipemia    . Hypertensive disorder    . Hypokalemia    . Neuropathy    . Stroke 2012   . TIA (transient ischemic attack)         Past Surgical History:  Past Surgical History:   Procedure Laterality Date   . HYSTERECTOMY     . REDUCTION MAMMAPLASTY     . TONSILLECTOMY     . urinary bladder         Social History:  Social History     Social History   . Marital status: Married     Spouse name: N/A   . Number of children: N/A   . Years of education: N/A     Occupational History   . Not on file.     Social History Main Topics   . Smoking status: Never Smoker   . Smokeless tobacco: Never Used   . Alcohol use No   . Drug use: No   . Sexual activity: Not on file      Comment: married, not working ,     Other Topics Concern   . Not on file     Social History Narrative   . No narrative on file       Family History:  Family History   Problem Relation Age of Onset   . Diabetes Mother    . Diabetes Maternal Grandmother  Outpatient Medication:  Discharge Medication List as of 06/06/2018 12:28 AM      CONTINUE these medications which have NOT CHANGED    Details   glipiZIDE (GLUCOTROL) 10 MG 24 hr tablet TAKE 2 TABLETS BY MOUTH EVERY DAY WITH BREAKFAST, Normal      LEVEMIR FLEXPEN 100 UNIT/ML injection pen Inject 32 Units into the skin nightly., Starting Fri 06/30/2017, Normal      lisinopril (PRINIVIL,ZESTRIL) 40 MG tablet Take 1 tablet (40 mg total) by mouth daily., Starting Tue 06/20/2017, Normal      METOPROLOL SUCCINATE ER PO Take 50 mg by mouth daily, Historical Med      simvastatin (ZOCOR) 40 MG tablet TAKE 1 TABLET BY MOUTH EVERY DAY AT NIGHT, Normal      TRADJENTA 5 MG Tab TAKE 1 TABLET BY MOUTH EVERY DAY, Normal               REVIEW OF SYSTEMS   Review of Systems   Constitutional: Positive for diaphoresis and malaise/fatigue. Negative for chills and fever.   HENT: Negative for congestion.    Eyes: Negative for double vision.   Respiratory: Negative for cough and shortness of breath.    Cardiovascular: Negative for chest pain.   Gastrointestinal: Positive for nausea and vomiting. Negative for abdominal pain.   Genitourinary: Negative for  dysuria and hematuria.   Musculoskeletal: Positive for myalgias.   Skin: Negative for rash.   Neurological: Positive for dizziness, tingling, weakness and headaches. Negative for focal weakness.           PHYSICAL EXAM   BP 165/89   Pulse 61   Temp 97.9 F (36.6 C)   Resp 20   Ht 5\' 5"  (1.651 m)   Wt 97.7 kg   BMI 35.84 kg/m     Physical Exam   Constitutional: She is oriented to person, place, and time. She appears well-developed.   HENT:   Head: Normocephalic and atraumatic.   Eyes: Pupils are equal, round, and reactive to light.   Neck: Normal range of motion. Neck supple.   Cardiovascular: Normal rate, regular rhythm and normal heart sounds.    Pulmonary/Chest: Effort normal and breath sounds normal. No respiratory distress. She has no wheezes.   Abdominal: Soft. Bowel sounds are normal. There is no tenderness. There is no rebound and no guarding.   Musculoskeletal: Normal range of motion.   Neurological: She is alert and oriented to person, place, and time.   Right sided weakness- chronic.  4/5 strength upper and lower right sided extremities.  Right sided facial droop- chronic.  Diminished sensation to light touch right sided   Skin: Skin is warm and dry. No rash noted. She is not diaphoretic.   Psychiatric: She has a normal mood and affect.   Nursing note and vitals reviewed.          MEDICAL DECISION MAKING     DISCUSSION        MEDICAL DECISION MAKING:    67 y.o. female with complaint of headache, weakness.   Patient denies any specific pain on presentation.  She has no new weakness, do not suspect CVA.  Differential includes electrolyte abnormality, atypical chest pain, dehydration, vertigo, headache, hypertensive urgency.  Lab work shows no acute findings.  No evidence of endorgan dysfunction.  She has a negative initial, repeat troponin I.  CT head shows no evidence of acute disease process.  Chest is without focal infiltrate.  Patient was hydrated.  She was given  a dose of Ativan, as patient does  have a history of anxiety after having a stroke.  Patient was able to stand up, ambulate without limitations.  She did have a slight episode of feeling slightly dizzy.  No associated diplopia, dysphasia, dysarthria, dysmetria.  Do not suspect a central cause to her vertigo.  Patient advised hydration.  She was also prescribed medication for her anxiety symptoms, as she has been having symptoms of anxiety, panic in the past and ativan seems to have helped her.      On reevaluation, pt feeling improved.  Discussed current results and treatment plan.  Patient in agreement with treatment and follow-up plans. Stable for discharge with a safe ride. Strict return precautions discussed.      Procedures    Oxygen Saturation by Pulse Oximetry: 98%  Interventions: None Needed.  Interpretation: Normal    Vital Signs:  Reviewed the patient's vital signs.  Nursing Notes:  Reviewed and utilized available nursing notes.  Medical Records:  Reviewed available past medical records.        DIAGNOSTIC STUDIES      XR Chest 2 Views   Final Result    No acute disease.      Rocky Crafts, MD    06/05/2018 10:25 PM      CT Head WO Contrast   Final Result    Normal, stable appearance of the brain.                  This CT study was performed using radiation dose reduction techniques   including one or more of the following: automated exposure control,   adjustment of the mA and/or kV according to patient's size and the use   of an iterative reconstruction technique.      Trilby Drummer, MD    06/05/2018 10:23 PM            Results     Procedure Component Value Units Date/Time    Troponin I - Repeat [[id number]] Collected:  06/05/18 2353    Specimen:  Blood Updated:  06/06/18 0021     Troponin I <0.01 ng/mL     B-type Natriuretic Peptide [[id number]] Collected:  06/05/18 2200    Specimen:  Blood Updated:  06/05/18 2236     B-Natriuretic Peptide 24 pg/mL     Troponin I [[id number]] Collected:  06/05/18 2200    Specimen:  Blood Updated:  06/05/18  2236     Troponin I <0.01 ng/mL     Lipase [[id number]] Collected:  06/05/18 2200    Specimen:  Blood Updated:  06/05/18 2226     Lipase 14 U/L     Magnesium [[id number]] Collected:  06/05/18 2200    Specimen:  Blood Updated:  06/05/18 2226     Magnesium 1.8 mg/dL     Phosphorus [[id number]] Collected:  06/05/18 2200    Specimen:  Blood Updated:  06/05/18 2226     Phosphorus 2.9 mg/dL     GFR [[id number]] Collected:  06/05/18 2200     Updated:  06/05/18 2226     EGFR 55.7    Comprehensive metabolic panel [[id number]] Collected:  06/05/18 2200    Specimen:  Blood Updated:  06/05/18 2226     Glucose 88 mg/dL      BUN 61.6 mg/dL      Creatinine 1.0 mg/dL      Sodium 073 mEq/L      Potassium 4.1 mEq/L      Chloride  105 mEq/L      CO2 26 mEq/L      Calcium 9.2 mg/dL      Protein, Total 6.8 g/dL      Albumin 3.7 g/dL      AST (SGOT) 23 U/L      ALT 20 U/L      Alkaline Phosphatase 80 U/L      Bilirubin, Total 0.2 mg/dL      Globulin 3.1 g/dL      Albumin/Globulin Ratio 1.2     Anion Gap 12.0    UA with reflex to micro (pts  3 + yrs) [[id number]] Collected:  06/05/18 2210    Specimen:  Urine from Urine, Clean Catch Updated:  06/05/18 2223     Urine Type Clean Catch     Color, UA YELLOW     Clarity, UA CLEAR     Specific Gravity UA 1.010     Urine pH 7.5     Leukocyte Esterase, UA NEGATIVE     Nitrite, UA NEGATIVE     Protein, UR NEGATIVE     Glucose, UA NEGATIVE     Ketones UA NEGATIVE     Urobilinogen, UA 0.2 mg/dL      Bilirubin, UA NEGATIVE     Blood, UA NEGATIVE    CBC with differential [[id number]]  (Abnormal) Collected:  06/05/18 2200    Specimen:  Blood from Blood Updated:  06/05/18 2208     WBC 8.60 x10 3/uL      Hgb 11.5 g/dL      Hematocrit 78.2 %      Platelets 158 x10 3/uL      RBC 4.17 x10 6/uL      MCV 86.8 fL      MCH 27.6 pg      MCHC 31.8 g/dL      RDW 14 %      MPV 13.0 (H) fL      Neutrophils 62.8 %      Lymphocytes Automated 30.3 %      Monocytes 5.9 %      Eosinophils Automated 0.2 %      Basophils Automated  0.3 %      Immature Granulocyte 0.5 %      Nucleated RBC 0.0 /100 WBC      Neutrophils Absolute 5.39 x10 3/uL      Abs Lymph Automated 2.61 x10 3/uL      Abs Mono Automated 0.51 x10 3/uL      Abs Eos Automated 0.02 x10 3/uL      Absolute Baso Automated 0.03 x10 3/uL      Absolute Immature Granulocyte 0.04 x10 3/uL      Absolute NRBC 0.00 x10 3/uL               EMERGENCY DEPT. MEDICATIONS      ED Medication Orders     Start Ordered     Status Ordering Provider    06/05/18 2347 06/05/18 2346  meclizine (ANTIVERT) tablet 25 mg  Once     Route: Oral  Ordered Dose: 25 mg     Last MAR action:  Given Tenea Sens T    06/05/18 2322 06/05/18 2321  LORazepam (ATIVAN) tablet 0.5 mg  Once     Route: Oral  Ordered Dose: 0.5 mg     Last MAR action:  Given Mitchel Honour T    06/05/18 2250 06/05/18 2249    Once in ED     Route: Oral  Ordered Dose: 0.25 mg     Discontinued Mitchel Honour T    06/05/18 2156 06/05/18 2155  sodium chloride 0.9 % bolus 1,000 mL  Once     Route: Intravenous  Ordered Dose: 1,000 mL     Last MAR action:  Stopped Urijah Arko T              DIAGNOSIS      Diagnosis:  Final diagnoses:   Generalized weakness   Restless legs   Hypertension, unspecified type   Headache, unspecified headache type   Dizziness, nonspecific       Disposition:  ED Disposition     ED Disposition Condition Date/Time Comment    Discharge  Wed Jun 06, 2018 12:24 AM Emily Rowe discharge to home/self care.    Condition at disposition: Stable          Prescriptions:  Discharge Medication List as of 06/06/2018 12:28 AM      START taking these medications    Details   ALPRAZolam (XANAX) 0.25 MG tablet Take 1 tablet (0.25 mg total) by mouth nightly as needed for Sleep or Anxiety, Starting Wed 06/06/2018, Normal      rOPINIRole (REQUIP) 0.25 MG tablet Take 1 tablet (0.25 mg total) by mouth nightly as needed (restless legs), Starting Wed 06/06/2018, Normal         CONTINUE these medications which have NOT CHANGED     Details   glipiZIDE (GLUCOTROL) 10 MG 24 hr tablet TAKE 2 TABLETS BY MOUTH EVERY DAY WITH BREAKFAST, Normal      LEVEMIR FLEXPEN 100 UNIT/ML injection pen Inject 32 Units into the skin nightly., Starting Fri 06/30/2017, Normal      lisinopril (PRINIVIL,ZESTRIL) 40 MG tablet Take 1 tablet (40 mg total) by mouth daily., Starting Tue 06/20/2017, Normal      METOPROLOL SUCCINATE ER PO Take 50 mg by mouth daily, Historical Med      simvastatin (ZOCOR) 40 MG tablet TAKE 1 TABLET BY MOUTH EVERY DAY AT NIGHT, Normal      TRADJENTA 5 MG Tab TAKE 1 TABLET BY MOUTH EVERY DAY, Normal               I, Dr. Excell Seltzer, DO, have been the primary provider for Emily Rowe during this Emergency Dept visit.     [address] Noga Fogg T, DO  06/06/18 [phone]

## 2018-06-05 NOTE — ED Triage Notes (Signed)
Pt. C/o nausea, right sided headache, right arm and leg pain. Pt states its been going on for a year, pain start on her right leg, pain is like someone is sitting on it, go all the way to her stomach arm and head. Pt states that when she had this symptoms her leg, arm feels numb, feel dizzy. A&OX4. Denies chest pain.

## 2018-06-06 LAB — TROPONIN I: Troponin I: <0.01 ng/mL (ref 0.00–0.09)

## 2018-06-06 MED ORDER — ALPRAZOLAM 0.25 MG PO TABS
0.25 mg | ORAL_TABLET | Freq: Every evening | ORAL | 0 refills | Status: DC | PRN
Start: 2018-06-06 — End: 2019-05-11

## 2018-06-06 MED ORDER — ROPINIROLE HCL 0.25 MG PO TABS
0.25 mg | ORAL_TABLET | Freq: Every evening | ORAL | 0 refills | Status: DC | PRN
Start: 2018-06-06 — End: 2019-05-11

## 2018-06-06 NOTE — Discharge Instructions (Signed)
Weakness / Fatigue    You have been seen today for generalized weakness. This may also be described as fatigue.    Weakness is a common problem, especially in older individuals.    It is important to understand the difference between true weakness (real weakness from a nerve or brain problem) and the more common problem of fatigue. These words might seem similar but they do mean very different problems.   Fatigue: When a person is describing fatigue, they may feel tired out very quickly even with just a little activity. They may also say they are feeling tired, sleepy, easily exhausted and unable to do normal daily activities because they don't seem to have enough energy.   True Weakness: When someone has true weakness, it means that the muscles are not working right. For example, a leg might be truly weak if you can't support your weight on it or if you can't get up from a chair because the thigh muscles aren't strong enough.    There are many causes of weakness including; Infections (often kidney/bladder infections or pneumonias), electrolyte abnormalities (low sodium, low potassium), depression, and neurologic (brain or nerve) disorders.    After looking at the results of the blood tests or X-rays, the cause of your weakness is:   Unclear or unknown.    It is VERY IMPORTANT to see your primary care doctor. More testing may be needed to figure out the cause of your weakness.    YOU SHOULD SEEK MEDICAL ATTENTION IMMEDIATELY, EITHER HERE OR AT THE NEAREST EMERGENCY DEPARTMENT, IF ANY OF THE FOLLOWING OCCURS:   Confusion, coma, agitation (becoming anxious or irritable).   Fever (temperature higher than 100.4F / 38C), vomiting.   Severe headache.   Signs of stroke (paralysis or numbness on one side of the body, drooping on one side of the face, difficulty talking).   Worsening weakness, difficulty standing, paralysis, loss of control of the bladder or bowels or difficulty swallowing.

## 2018-06-07 ENCOUNTER — Telehealth (INDEPENDENT_AMBULATORY_CARE_PROVIDER_SITE_OTHER): Payer: Self-pay | Admitting: Internal Medicine

## 2018-06-07 NOTE — Telephone Encounter (Signed)
Pt states she was just at the ER and needs to follow up with Dr.Garg. Pt would like to be fitted into her schedule . Please advise pt [phone]

## 2018-06-07 NOTE — Telephone Encounter (Signed)
Please review and schedule patient when provider has availability.

## 2018-06-12 NOTE — Telephone Encounter (Signed)
Patient verbalized understanding.    Patient states she has not been taking her medications because she feels weak.     BS:  08/26 0827 113  08/26 1224 118  08/27 AM FASTING 105    LOWEST 88  HIGHEST 138    Patient recently went to hospital (note in chart) for weakness and the day of that visit her sugar was 118.    She also stated she has had spikes her sugar will be 103 and then an hour alter it is 227.    Patient is taking Levemir 10 units AM and 10 units PM

## 2018-06-12 NOTE — Telephone Encounter (Signed)
Reviewed recommendations with patient. She verbalized understanding. She also asked if she should continue taking the glipizide daily and RN assented.

## 2018-06-12 NOTE — Telephone Encounter (Signed)
Left message for patient to return call.  If patient has to leave a voicemail advised to leave First Name, Last Name, DOB, Best Contact Number, and to spell first and last name

## 2018-06-12 NOTE — Telephone Encounter (Signed)
Theses blood sugars are in the normal range. She can decrease the dose to levemir 9 units in am and 9 units in pm to get the numbers more consistently in the 100s.

## 2018-06-12 NOTE — Telephone Encounter (Signed)
I called lmsg I stated I scheduled an appointment with Dr Eben Burow 06/15/2018 @3pm .  I left our number in case she needs to reschedule.

## 2018-06-15 ENCOUNTER — Ambulatory Visit (INDEPENDENT_AMBULATORY_CARE_PROVIDER_SITE_OTHER): Payer: Medicare Other | Admitting: Internal Medicine

## 2018-07-02 ENCOUNTER — Other Ambulatory Visit (INDEPENDENT_AMBULATORY_CARE_PROVIDER_SITE_OTHER): Payer: Self-pay | Admitting: Internal Medicine

## 2018-07-12 DIAGNOSIS — I69359 Hemiplegia and hemiparesis following cerebral infarction affecting unspecified side: Secondary | ICD-10-CM | POA: Insufficient documentation

## 2018-07-12 DIAGNOSIS — E119 Type 2 diabetes mellitus without complications: Secondary | ICD-10-CM | POA: Insufficient documentation

## 2018-07-12 DIAGNOSIS — E1142 Type 2 diabetes mellitus with diabetic polyneuropathy: Secondary | ICD-10-CM | POA: Insufficient documentation

## 2018-07-18 ENCOUNTER — Ambulatory Visit (INDEPENDENT_AMBULATORY_CARE_PROVIDER_SITE_OTHER): Payer: Self-pay | Admitting: Medical

## 2018-07-24 ENCOUNTER — Ambulatory Visit (INDEPENDENT_AMBULATORY_CARE_PROVIDER_SITE_OTHER): Payer: No Typology Code available for payment source | Admitting: Internal Medicine

## 2018-07-28 ENCOUNTER — Encounter (INDEPENDENT_AMBULATORY_CARE_PROVIDER_SITE_OTHER): Payer: Self-pay | Admitting: Family Medicine

## 2018-09-03 ENCOUNTER — Ambulatory Visit (INDEPENDENT_AMBULATORY_CARE_PROVIDER_SITE_OTHER): Payer: No Typology Code available for payment source | Admitting: Internal Medicine

## 2018-09-17 ENCOUNTER — Other Ambulatory Visit (INDEPENDENT_AMBULATORY_CARE_PROVIDER_SITE_OTHER): Payer: Self-pay | Admitting: Internal Medicine

## 2018-09-17 DIAGNOSIS — E119 Type 2 diabetes mellitus without complications: Secondary | ICD-10-CM

## 2018-09-24 ENCOUNTER — Encounter (INDEPENDENT_AMBULATORY_CARE_PROVIDER_SITE_OTHER): Payer: Self-pay | Admitting: Internal Medicine

## 2018-10-11 ENCOUNTER — Encounter (INDEPENDENT_AMBULATORY_CARE_PROVIDER_SITE_OTHER): Payer: Self-pay | Admitting: Internal Medicine

## 2018-10-11 ENCOUNTER — Other Ambulatory Visit: Payer: Medicare Other

## 2018-10-11 ENCOUNTER — Ambulatory Visit (INDEPENDENT_AMBULATORY_CARE_PROVIDER_SITE_OTHER): Payer: Medicare Other | Admitting: Internal Medicine

## 2018-10-11 VITALS — BP 125/75 | HR 73 | Ht 65.0 in | Wt 206.0 lb

## 2018-10-11 DIAGNOSIS — E1165 Type 2 diabetes mellitus with hyperglycemia: Secondary | ICD-10-CM

## 2018-10-11 DIAGNOSIS — E669 Obesity, unspecified: Secondary | ICD-10-CM

## 2018-10-11 DIAGNOSIS — E782 Mixed hyperlipidemia: Secondary | ICD-10-CM

## 2018-10-11 DIAGNOSIS — Z6834 Body mass index (BMI) 34.0-34.9, adult: Secondary | ICD-10-CM

## 2018-10-11 DIAGNOSIS — I10 Essential (primary) hypertension: Secondary | ICD-10-CM

## 2018-10-11 LAB — HEMOGLOBIN A1C
Average Estimated Glucose: 203 mg/dL
Hemoglobin A1C: 8.7 % — ABNORMAL HIGH (ref 4.6–5.9)

## 2018-10-11 LAB — COMPREHENSIVE METABOLIC PANEL
ALT: 19 U/L (ref 0–55)
AST (SGOT): 18 U/L (ref 5–34)
Albumin/Globulin Ratio: 1.1 (ref 0.9–2.2)
Albumin: 4 g/dL (ref 3.5–5.0)
Alkaline Phosphatase: 101 U/L (ref 37–106)
BUN: 17 mg/dL (ref 7.0–19.0)
Bilirubin, Total: 0.4 mg/dL (ref 0.2–1.2)
CO2: 29 mEq/L (ref 21–29)
Calcium: 10.3 mg/dL (ref 8.5–10.5)
Chloride: 100 mEq/L (ref 100–111)
Creatinine: 0.8 mg/dL (ref 0.4–1.5)
Globulin: 3.7 g/dL (ref 2.0–3.7)
Glucose: 157 mg/dL — ABNORMAL HIGH (ref 70–100)
Potassium: 4.2 mEq/L (ref 3.5–5.1)
Protein, Total: 7.7 g/dL (ref 6.0–8.3)
Sodium: 140 mEq/L (ref 136–145)

## 2018-10-11 LAB — LIPID PANEL
Cholesterol / HDL Ratio: 5.5
Cholesterol: 251 mg/dL — ABNORMAL HIGH (ref 0–199)
HDL: 46 mg/dL (ref 40–9999)
LDL Calculated: 182 mg/dL — ABNORMAL HIGH (ref 0–99)
Triglycerides: 114 mg/dL (ref 34–149)
VLDL Calculated: 23 mg/dL (ref 10–40)

## 2018-10-11 LAB — MICROALBUMIN, RANDOM URINE
Urine Creatinine, Random: 140.4 mg/dL
Urine Microalbumin, Random: 8 (ref 0.0–30.0)
Urine Microalbumin/Creatinine Ratio: 6 ug/mg (ref 0–30)

## 2018-10-11 LAB — TSH: TSH: 1.39 u[IU]/mL (ref 0.35–4.94)

## 2018-10-11 LAB — GFR: EGFR: >60

## 2018-10-11 LAB — HEMOLYSIS INDEX: Hemolysis Index: 8 (ref 0–18)

## 2018-10-11 MED ORDER — LANTUS SOLOSTAR 100 UNIT/ML SC SOPN
PEN_INJECTOR | SUBCUTANEOUS | 0 refills | Status: DC
Start: 2018-10-11 — End: 2019-02-20

## 2018-10-11 NOTE — Progress Notes (Signed)
Subjective:      Date: 10/11/2018 9:58 AM   Patient ID: Emily Rowe is a 67 y.o. female.    Chief Complaint:  Chief Complaint   Patient presents with   . Uncontrolled type 2 diabetes mellitus with hyperglycemia     Refills needed       HPI  Visit type: follow-up - type II DM   Diagnosed: over 5 years ago  Type: Insulin requiring  Complications: hyperglycemia  Control: inadequate control  Comorbid illness: hyperlipidemia  Last follow-up: 2 months ago  with NP Bynum Bellows   Diabetes Medication Regimen: Glipizide XL 20 mg daily , Tradjenta 5 mg daily , Levemir 25 units at bedtime (stopped taking Levemir for 1 month now).  Unable to tolerate Metformin due to diarrhea, unable to tolerate Victoza due to n/v, unable to tolerate Invokana. Taking potassium pill daily along with eating sweet potato and avocado everyday. Unable to tolerate Levemir due to episodes of nausea and vomiting. Not able to tolerate tresiba.  Medication effectiveness/adherence: working well and stopped taking Levemir   Medication side effects: none  Interval events: no interval events   Interval symptoms: none  Patient denies: no other symptoms including chest pain, shortness of breath, hypoglycemic episodes, polydipsia, polyuria, headaches, dizziness, or abdominal pain  Home glucose readings:  FBG 100-150, 2H PPBG 140-160's  Daily basal insulin dosing: Levemir (stopped taking 1 month ago)   Daily bolus insulin dosing: non-insulin requiring  Diabetic screening: yesterday, January 02, 2018, no known retinopathy  Podiatric assessment: admits LE burning/tingling due to hx of stroke   Exercise: walks occasionally   Diet: moderate compliance with recommended diet; eats more vegetables and protein   Response to therapy: with hemoglobin A1C above goal  A1c trend: improving     Lab Results   Component Value Date    HGBA1C 8.1 (H) 05/21/2018    HGBA1C 8.5 (H) 11/22/2017    HGBA1C 9.1 (H) 08/11/2017     Trend is decreasing  Additional  concerns: In addition, pt has hyperlipidemia and is compliant with lipid therapy.  Pt denies side effects of lipid therapy - specifically denies myalgia., In addition, pt has stable essential HTN with no evidence of CHF or proteinuria.  The patient is compliant with anti-hypertensive therapy and denies side effects to therapy.  Pt denies CP, SOB, dizziness, orthopnea, PND or edema.     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     H/o severe hypokalemia which required ER visit; had been off lisinopril at that time.  Notes allergic reaction soon after taking crestor.      Interval history: Elements of HPI were reviewed and updated. Notes worsening symptoms of anxiety, which results in episodes of hyperglycemia. Notes c/o abdominal bloating. Has been unable to tolerate long term use of levemir which results in GI side effects. Notes consistent compliance with listed medications for hypertension and hyperlipidemia.      Problem List:  Patient Active Problem List   Diagnosis   . Uncontrolled type 2 diabetes mellitus with hyperglycemia   . Mixed hyperlipidemia   . Benign hypertension   . Long-term insulin use   . Obesity (BMI 30-39.9)   . Neuropathy   . Stroke       Current Medications:  Current Outpatient Medications   Medication Sig Dispense Refill   . ALPRAZolam (XANAX) 0.25 MG tablet Take 1 tablet (0.25 mg total) by mouth nightly as needed for  Sleep or Anxiety 14 tablet 0   . glipiZIDE (GLUCOTROL) 10 MG 24 hr tablet TAKE 2 TABLETS BY MOUTH EVERY DAY WITH BREAKFAST 180 tablet 1   . LEVEMIR FLEXTOUCH 100 UNIT/ML injection pen INJECT 32 UNITS INTO THE SKIN NIGHTLY. 30 mL 1   . lisinopril (PRINIVIL,ZESTRIL) 40 MG tablet Take 1 tablet (40 mg total) by mouth daily. 90 tablet 3   . METOPROLOL SUCCINATE ER PO Take 50 mg by mouth daily     . rOPINIRole (REQUIP) 0.25 MG tablet Take 1 tablet (0.25 mg total) by mouth nightly as needed (restless legs) 10 tablet 0   . simvastatin  (ZOCOR) 40 MG tablet TAKE 1 TABLET BY MOUTH EVERY DAY AT NIGHT 90 tablet 1   . TRADJENTA 5 MG Tab TAKE 1 TABLET BY MOUTH EVERY DAY 90 tablet 1     No current facility-administered medications for this visit.        Allergies:  Allergies   Allergen Reactions   . Codeine Nausea And Vomiting   . Crestor [Rosuvastatin] Rash     D/c due to severe rash and higher blood sugars   . Glucosamine Hives and Nausea And Vomiting   . Latex Rash   . Gabapentin      Dizziness and rash, irregular heart rate   . Flaxseed [Linseed Oil] Rash   . Omega 3 [Fish Oil] Rash   . Simvastatin Rash     Patients reports able to tolerate medication and is currently taking        Past Medical History:  Past Medical History:   Diagnosis Date   . Anxiety    . Depression    . Diabetes    . Hyperlipemia    . Hypertensive disorder    . Hypokalemia    . Neuropathy    . Stroke 2012   . TIA (transient ischemic attack)        Past Surgical History:  Past Surgical History:   Procedure Laterality Date   . HYSTERECTOMY     . REDUCTION MAMMAPLASTY     . TONSILLECTOMY     . urinary bladder         Family History:  Family History   Problem Relation Age of Onset   . Diabetes Mother    . Diabetes Maternal Grandmother        Social History:  Social History     Socioeconomic History   . Marital status: Married     Spouse name: Not on file   . Number of children: Not on file   . Years of education: Not on file   . Highest education level: Not on file   Occupational History   . Not on file   Social Needs   . Financial resource strain: Not on file   . Food insecurity:     Worry: Not on file     Inability: Not on file   . Transportation needs:     Medical: Not on file     Non-medical: Not on file   Tobacco Use   . Smoking status: Never Smoker   . Smokeless tobacco: Never Used   Substance and Sexual Activity   . Alcohol use: No   . Drug use: No   . Sexual activity: Not on file     Comment: married, not working ,   Lifestyle   . Physical activity:     Days per week: Not on  file  Minutes per session: Not on file   . Stress: Not on file   Relationships   . Social connections:     Talks on phone: Not on file     Gets together: Not on file     Attends religious service: Not on file     Active member of club or organization: Not on file     Attends meetings of clubs or organizations: Not on file     Relationship status: Not on file   . Intimate partner violence:     Fear of current or ex partner: Not on file     Emotionally abused: Not on file     Physically abused: Not on file     Forced sexual activity: Not on file   Other Topics Concern   . Not on file   Social History Narrative   . Not on file       The following sections were reviewed this encounter by the provider:   Tobacco  Allergies  Meds  Problems  Med Hx  Surg Hx  Fam Hx        ROS:  A complete 12 point ROS was obtained. Pertinent positives and negatives as noted in HPI. All other systems are negative.      Vitals:  BP 125/75   Pulse 73   Ht 1.651 m (5\' 5" )   Wt 93.4 kg (206 lb)   SpO2 94%   BMI 34.28 kg/m    Wt Readings from Last 3 Encounters:   10/11/18 93.4 kg (206 lb)   06/05/18 97.7 kg (215 lb 6.2 oz)   05/21/18 91.2 kg (201 lb)     - 9 lbs since 8/19    Physical Examination     General appearance: alert, appears stated age, cooperative and no distress  Eyes: conjunctivae/corneas clear. PERRL, EOM's intact.   Neck: no cervical lymphadenopathy, no carotid bruit,  supple, symmetrical, trachea midline   Thyroid:normal sized on inspection; non tender to palpation and no discrete nodules palpated  Lungs: clear to auscultation bilaterally  Heart: regular rate and rhythm, S1, S2 normal, no murmur, click, rub or gallop  Extremities: extremities normal, atraumatic, no cyanosis or edema  Foot:  Pedal pulses 2+ bilaterally; no lesions or ulcers; sensation intact to monofilament and vibration by TF testing   Skin: Skin color, texture, turgor normal. No rashes or lesions  Neurologic: Alert and oriented X 3, normal  strength and tone. Normal symmetric reflexes. No tremor  Foot Exam: monofilament testing of plantar aspect of first toe and metatarsal joints (10g monofilament) intact bilaterally, no fissures, maceration, ulceration bilaterally, normal hair growth bilaterally, normal pigmentation bilaterally and vibratory perception (128 Hz tuning fork) applied to first toe intact bilaterally      Assessment and Plan     1. Uncontrolled type 2 diabetes mellitus with hyperglycemia  Lab Results   Component Value Date    HGBA1C 8.1 (H) 05/21/2018    HGBA1C 8.5 (H) 11/22/2017    HGBA1C 9.1 (H) 08/11/2017       Current A1C is elevated above goal and trend has not changed compared to prior values..  Recommend optimizing low carbohydrate diet efforts and drinking at least 60 ounces of H20 daily.  Exercise goal - 150 minutes of sustained cardio exercise per week. Recommend home glucometer testing AC and HS.  Retinopathy surveillance is UTD. Podiatry surveillance is UTD. Microalbumin testing is UTD within past year.    Given h/o intolerance to tresiba,  toujeo, basaglar. Recommend switching to lantus solostar. Prescription has been sent to pharmacy on file.  - Hemoglobin A1C  - Comprehensive metabolic panel  - Lipid panel  - TSH  - Urine Microalbumin Random  - insulin glargine (LANTUS SOLOSTAR) 100 UNIT/ML injection pen; Inject 10 units into skin daily at bedtime  Dispense: 15 mL; Refill: 0    2. Benign hypertension  BP is well controlled on the current meds.    3. Mixed hyperlipidemia  On statins; well tolerated.  - Lipid panel    4. Obesity (BMI 30-39.9)  Recommend calorie restricted diet and exercise for weight loss      5. Body mass index 34.0-34.9, adult      RTC in 5 months    Tanya Nones MD MPH  Endocrinologist, [facility]

## 2018-10-23 NOTE — Progress Notes (Signed)
Please call with the following: A1c/ 3 month estimate of avg sugars is in the range of uncontrolled diabetes; target A1c is less than 7%. Total cholesterol and LDL/ bad cholesterol level is high; the rest of the lab results are in the acceptable range. Please ensure compliance with checking your sugars at least x2/ day and taking medications as instructed regularly. Please bring sugar log to your follow up visit.

## 2018-10-25 ENCOUNTER — Telehealth (INDEPENDENT_AMBULATORY_CARE_PROVIDER_SITE_OTHER): Payer: Self-pay

## 2018-10-25 ENCOUNTER — Other Ambulatory Visit (INDEPENDENT_AMBULATORY_CARE_PROVIDER_SITE_OTHER): Payer: Self-pay

## 2018-10-25 NOTE — Telephone Encounter (Signed)
Called patient and had to leave message to call back to schedule a meeting.  I will call again early next week if I have not heard  back from her. Thanks.

## 2018-10-25 NOTE — Telephone Encounter (Signed)
Notes recorded by Rip Harbour, LPN on 06/22/453 at 9:51 AM EST    Reviewed recommendations patient verbalized understanding. Patient states that she is not able to tolerated Simvastatin due to she has S/s of rash and stomach . She states that she only takes Simvastatin twice a week. She has tried Crestor in the past but is not able tolerate because her S/s of rash and stomach aches are even worse . Please advise if there is another alternative. Patient also stated that she would like to discuss with a nutritionist regarding food options and what she can eat to help with lower her Cholesterol . Informed patient that we do have a nurse navigator that can recommend some nutritional guidance.     ------    Notes recorded by Tanya Nones, MD on 10/23/2018 at 6:42 AM EST  Please call with the following: A1c/ 3 month estimate of avg sugars is in the range of uncontrolled diabetes; target A1c is less than 7%. Total cholesterol and LDL/ bad cholesterol level is high; the rest of the lab results are in the acceptable range. Please ensure compliance with checking your sugars at least x2/ day and taking medications as instructed regularly. Please bring sugar log to your follow up visit.

## 2018-10-25 NOTE — Telephone Encounter (Signed)
Agreed with meeting NN to help with some nutritional guidance for diabetes and hyperlipidemia. Notes routed to [place] as well. Thanks

## 2018-10-25 NOTE — Progress Notes (Signed)
Nurse Navigator placed a call to the patient per Dr.Gundu Smith Robert referral. There was no answer. LVMM introducing Nurse Navigator and explaining education role. Contact information left for questions or concerns or scheduling a meeting to review diabetes education. Pending call back.      Waneta Martins, MSN, [facility]  [address], [address]  [place], [place] [address]  T [phone]

## 2018-11-01 ENCOUNTER — Other Ambulatory Visit (INDEPENDENT_AMBULATORY_CARE_PROVIDER_SITE_OTHER): Payer: Self-pay

## 2018-11-01 NOTE — Progress Notes (Signed)
Nurse Navigator placed a call to patient per Dr.Gundu Rao referral. There was no answer. LVMM introducing Nurse Navigator and explaining education role. Contact information left for questions or concerns or scheduling a meeting to review diabetes education.       Seneca Hoback, MSN, RN   Nurse Navigator   [facility]  [address], [address]  [place], [place] [address]  T [phone]

## 2018-11-26 ENCOUNTER — Other Ambulatory Visit (INDEPENDENT_AMBULATORY_CARE_PROVIDER_SITE_OTHER): Payer: Self-pay | Admitting: Internal Medicine

## 2018-11-26 DIAGNOSIS — E1165 Type 2 diabetes mellitus with hyperglycemia: Secondary | ICD-10-CM

## 2019-01-08 ENCOUNTER — Ambulatory Visit (INDEPENDENT_AMBULATORY_CARE_PROVIDER_SITE_OTHER): Payer: Medicare Other | Admitting: "Endocrinology

## 2019-01-23 ENCOUNTER — Other Ambulatory Visit (INDEPENDENT_AMBULATORY_CARE_PROVIDER_SITE_OTHER): Payer: Self-pay

## 2019-01-23 NOTE — Progress Notes (Signed)
Nurse Navigator placed a call to patient to follow up on diabetes management and BG monitoring. There was no answer. LVMM with contact information for an update.  Pending call back.      Dmya Long, MSN, RN   Nurse Navigator   [facility]  [address], [address]  [place], [place] [address]  T [phone]

## 2019-02-20 ENCOUNTER — Other Ambulatory Visit (INDEPENDENT_AMBULATORY_CARE_PROVIDER_SITE_OTHER): Payer: Self-pay

## 2019-02-20 DIAGNOSIS — E1165 Type 2 diabetes mellitus with hyperglycemia: Secondary | ICD-10-CM

## 2019-02-20 NOTE — Telephone Encounter (Signed)
Please call patient to schedule follow up.      LV 10/11/18

## 2019-02-25 MED ORDER — LANTUS SOLOSTAR 100 UNIT/ML SC SOPN
PEN_INJECTOR | SUBCUTANEOUS | 0 refills | Status: DC
Start: 2019-02-25 — End: 2019-11-21

## 2019-03-06 ENCOUNTER — Telehealth (INDEPENDENT_AMBULATORY_CARE_PROVIDER_SITE_OTHER): Payer: Medicare Other | Admitting: Internal Medicine

## 2019-03-12 ENCOUNTER — Ambulatory Visit (INDEPENDENT_AMBULATORY_CARE_PROVIDER_SITE_OTHER): Payer: Medicare Other | Admitting: Internal Medicine

## 2019-03-20 ENCOUNTER — Telehealth (INDEPENDENT_AMBULATORY_CARE_PROVIDER_SITE_OTHER): Payer: Medicare Other | Admitting: Internal Medicine

## 2019-03-20 ENCOUNTER — Encounter (INDEPENDENT_AMBULATORY_CARE_PROVIDER_SITE_OTHER): Payer: Self-pay | Admitting: Internal Medicine

## 2019-03-20 DIAGNOSIS — E1165 Type 2 diabetes mellitus with hyperglycemia: Secondary | ICD-10-CM

## 2019-03-20 DIAGNOSIS — I10 Essential (primary) hypertension: Secondary | ICD-10-CM

## 2019-03-20 NOTE — Progress Notes (Signed)
Subjective:      Date: 03/20/2019 2:09 PM   Patient ID: Emily Rowe is a 68 y.o. female.    Chief Complaint:  Chief Complaint   Patient presents with    Uncontrolled type 2 diabetes mellitus with hyperglycemia     Verbal consent has been obtained from the patient to conduct a video and telephone visit to minimize exposure to COVID-19: YES    HPI  Visit type: follow-up - type II DM   Diagnosed: over 5 years ago  Type: Insulin requiring  Complications: hyperglycemia  Control: inadequate control  Comorbid illness: hyperlipidemia  Last follow-up: 6 months ago    Diabetes Medication Regimen: Glipizide XL 20 mg daily , Tradjenta 5 mg daily , Lantus 10 units daily     Unable to tolerate Metformin due to diarrhea, unable to tolerate Victoza due to n/v, unable to tolerate Invokana. Taking potassium pill daily along with eating sweet potato and avocado everyday. Unable to tolerate Levemir due to episodes of nausea and vomiting. Not able to tolerate tresiba.  Medication effectiveness/adherence: working well and stopped taking Levemir   Medication side effects: none  Interval events: no interval events   Interval symptoms: none  Patient denies: no other symptoms including chest pain, shortness of breath, hypoglycemic episodes, polydipsia, polyuria, headaches, dizziness, or abdominal pain  Home glucose readings:  FBG 99-100, 2H PPBG 134-140 mg/dl  Daily basal insulin dosing: Levemir (stopped taking 1 month ago)   Daily bolus insulin dosing: non-insulin requiring  Diabetic screening: yesterday, January 02, 2018, no known retinopathy  Podiatric assessment: admits LE burning/tingling due to hx of stroke   Exercise: walks occasionally   Diet: moderate compliance with recommended diet; eats more vegetables and protein   Response to therapy: with hemoglobin A1C above goal  A1c trend: improving     Lab Results   Component Value Date    HGBA1C 8.7 (H) 10/11/2018    HGBA1C 8.1 (H) 05/21/2018    HGBA1C 8.5 (H) 11/22/2017      Trend is decreasing  Additional concerns: In addition, pt has hyperlipidemia and is compliant with lipid therapy.  Pt denies side effects of lipid therapy - specifically denies myalgia., In addition, pt has stable essential HTN with no evidence of CHF or proteinuria.  The patient is compliant with anti-hypertensive therapy and denies side effects to therapy.  Pt denies CP, SOB, dizziness, orthopnea, PND or edema.     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     H/o severe hypokalemia which required ER visit; had been off lisinopril at that time.  Notes allergic reaction soon after taking crestor.      Interval history: Elements of HPI were reviewed and updated. Notes worsening symptoms of anxiety, which results in episodes of hyperglycemia. Notes c/o abdominal bloating. Has been unable to tolerate long term use of levemir which results in GI side effects. Notes consistent compliance with listed medications for hypertension and hyperlipidemia.    Diet recall: BF: Waffle, apple, Lunch: Spinach, fries, Dinner: Fruit, vegetables      Problem List:  Patient Active Problem List   Diagnosis    Uncontrolled type 2 diabetes mellitus with hyperglycemia    Mixed hyperlipidemia    Benign hypertension    Long-term insulin use    Obesity (BMI 30-39.9)    Neuropathy    Stroke       Current Medications:  Current Outpatient Medications   Medication Sig  Dispense Refill    glipiZIDE (GLUCOTROL) 10 MG 24 hr tablet TAKE 2 TABLETS BY MOUTH EVERY DAY WITH BREAKFAST 180 tablet 1    insulin glargine (LANTUS SOLOSTAR) 100 UNIT/ML injection pen Inject 10 units into skin daily at bedtime 15 mL 0    lisinopril (PRINIVIL,ZESTRIL) 40 MG tablet Take 1 tablet (40 mg total) by mouth daily. 90 tablet 3    rosuvastatin (CRESTOR) 10 MG tablet Take 10 mg by mouth daily      TRADJENTA 5 MG Tab TAKE 1 TABLET BY MOUTH EVERY DAY 90 tablet 1    ALPRAZolam (XANAX) 0.25 MG tablet Take 1 tablet  (0.25 mg total) by mouth nightly as needed for Sleep or Anxiety 14 tablet 0    METOPROLOL SUCCINATE ER PO Take 50 mg by mouth daily      rOPINIRole (REQUIP) 0.25 MG tablet Take 1 tablet (0.25 mg total) by mouth nightly as needed (restless legs) 10 tablet 0    simvastatin (ZOCOR) 40 MG tablet TAKE 1 TABLET BY MOUTH EVERY DAY AT NIGHT 90 tablet 1     No current facility-administered medications for this visit.        Allergies:  Allergies   Allergen Reactions    Codeine Nausea And Vomiting    Crestor [Rosuvastatin] Rash     D/c due to severe rash and higher blood sugars    Glucosamine Hives and Nausea And Vomiting    Latex Rash    Eggs Or Egg-Derived Products      Patient had an allergy test and stated that she was allergic to eggs    Gabapentin      Dizziness and rash, irregular heart rate    Flaxseed [Linseed Oil] Rash    Omega 3 [Fish Oil] Rash    Simvastatin Rash     Patients reports able to tolerate medication and is currently taking        Past Medical History:  Past Medical History:   Diagnosis Date    Anxiety     Depression     Diabetes     Hyperlipemia     Hypertensive disorder     Hypokalemia     Neuropathy     Stroke 2012    TIA (transient ischemic attack)        Past Surgical History:  Past Surgical History:   Procedure Laterality Date    HYSTERECTOMY      REDUCTION MAMMAPLASTY      TONSILLECTOMY      urinary bladder         Family History:  Family History   Problem Relation Age of Onset    Diabetes Mother     Diabetes Maternal Grandmother        Social History:  Social History     Socioeconomic History    Marital status: Married     Spouse name: Not on file    Number of children: Not on file    Years of education: Not on file    Highest education level: Not on file   Occupational History    Not on file   Social Needs    Financial resource strain: Not on file    Food insecurity:     Worry: Not on file     Inability: Not on file    Transportation needs:     Medical: Not on  file     Non-medical: Not on file   Tobacco Use    Smoking status:  Never Smoker    Smokeless tobacco: Never Used   Substance and Sexual Activity    Alcohol use: No    Drug use: No    Sexual activity: Not on file     Comment: married, not working ,   Lifestyle    Physical activity:     Days per week: Not on file     Minutes per session: Not on file    Stress: Not on file   Relationships    Social connections:     Talks on phone: Not on file     Gets together: Not on file     Attends religious service: Not on file     Active member of club or organization: Not on file     Attends meetings of clubs or organizations: Not on file     Relationship status: Not on file    Intimate partner violence:     Fear of current or ex partner: Not on file     Emotionally abused: Not on file     Physically abused: Not on file     Forced sexual activity: Not on file   Other Topics Concern    Not on file   Social History Narrative    Not on file       The following sections were reviewed this encounter by the provider:       ROS:  A complete 12 point ROS was obtained. Pertinent positives and negatives as noted in HPI. All other systems are negative.      Vitals:  There were no vitals taken for this visit.   Wt Readings from Last 3 Encounters:   10/11/18 93.4 kg (206 lb)   06/05/18 97.7 kg (215 lb 6.2 oz)   05/21/18 91.2 kg (201 lb)     - 9 lbs since 8/19    Physical Examination     General appearance: alert, appears stated age, cooperative and no distress  Eyes: conjunctivae/corneas clear. PERRL, EOM's intact.   Neck: no cervical lymphadenopathy, no carotid bruit,  supple, symmetrical, trachea midline   Thyroid:normal sized on inspection; non tender to palpation and no discrete nodules palpated  Lungs: clear to auscultation bilaterally  Heart: regular rate and rhythm, S1, S2 normal, no murmur, click, rub or gallop  Extremities: extremities normal, atraumatic, no cyanosis or edema  Foot:  Pedal pulses 2+ bilaterally; no lesions  or ulcers; sensation intact to monofilament and vibration by TF testing   Skin: Skin color, texture, turgor normal. No rashes or lesions  Neurologic: Alert and oriented X 3, normal strength and tone. Normal symmetric reflexes. No tremor  Foot Exam: monofilament testing of plantar aspect of first toe and metatarsal joints (10g monofilament) intact bilaterally, no fissures, maceration, ulceration bilaterally, normal hair growth bilaterally, normal pigmentation bilaterally and vibratory perception (128 Hz tuning fork) applied to first toe intact bilaterally      Assessment and Plan     1. Uncontrolled type 2 diabetes mellitus with hyperglycemia  Lab Results   Component Value Date    HGBA1C 8.7 (H) 10/11/2018    HGBA1C 8.1 (H) 05/21/2018    HGBA1C 8.5 (H) 11/22/2017       Current A1C is elevated above goal and trend has not changed compared to prior values..  Recommend optimizing low carbohydrate diet efforts and drinking at least 60 ounces of H20 daily.  Exercise goal - 150 minutes of sustained cardio exercise per week. Recommend home glucometer testing AC and  HS.  Retinopathy surveillance is UTD. Podiatry surveillance is UTD. Microalbumin testing is UTD within past year.    Given h/o intolerance to tresiba, toujeo, basaglar. Recommend switching to lantus solostar. Prescription has been sent to pharmacy on file.  - Hemoglobin A1C  - Comprehensive metabolic panel  - Lipid panel  - TSH  - Urine Microalbumin Random  - insulin glargine (LANTUS SOLOSTAR) 100 UNIT/ML injection pen; Inject 10 units into skin daily at bedtime  Dispense: 15 mL; Refill: 0    2. Benign hypertension  BP is well controlled on the current meds.    3. Mixed hyperlipidemia  Lab Results   Component Value Date    CHOL 251 (H) 10/11/2018    CHOL 234 (H) 05/21/2018    CHOL 232 (H) 11/22/2017     Lab Results   Component Value Date    HDL 46 10/11/2018    HDL 44 05/21/2018    HDL 43 11/22/2017     Lab Results   Component Value Date    LDL 182 (H) 10/11/2018     LDL 153 (H) 05/21/2018    LDL 158 (H) 11/22/2017     Lab Results   Component Value Date    TRIG 114 10/11/2018    TRIG 184 (H) 05/21/2018    TRIG 156 (H) 11/22/2017       On statins; well tolerated.  - Lipid panel    4. Obesity (BMI 30-39.9)  Recommend calorie restricted diet and exercise for weight loss      5. Body mass index 34.0-34.9, adult      RTC in 5 months    Tanya Nones MD MPH  Endocrinologist, [facility]

## 2019-04-09 ENCOUNTER — Telehealth (INDEPENDENT_AMBULATORY_CARE_PROVIDER_SITE_OTHER): Payer: Self-pay

## 2019-04-09 NOTE — Telephone Encounter (Signed)
Patient LVM with FD stating pharmacy needs new rx's for insulin needles and test strips for Accu Check.  Left message for patient to return call to confirm meter brand and insulin needle preference.

## 2019-04-22 ENCOUNTER — Telehealth (INDEPENDENT_AMBULATORY_CARE_PROVIDER_SITE_OTHER): Payer: Self-pay

## 2019-04-22 DIAGNOSIS — E1165 Type 2 diabetes mellitus with hyperglycemia: Secondary | ICD-10-CM

## 2019-04-22 NOTE — Telephone Encounter (Signed)
Glucotrol XL is not covered by insurance please send in Glipizide, Glimepiride or Glipizide ER to [facility] [address]

## 2019-04-22 NOTE — Telephone Encounter (Signed)
[facility] pharmacy called and stated that Glucotrol XL is not covered by insurance, they are requesting a replacement be sent today because pt is out of medication.

## 2019-04-24 MED ORDER — GLIPIZIDE 10 MG PO TABS
10.0000 mg | ORAL_TABLET | Freq: Two times a day (BID) | ORAL | 1 refills | Status: DC
Start: 2019-04-24 — End: 2019-07-22

## 2019-04-24 MED ORDER — GLIPIZIDE 10 MG PO TABS
ORAL_TABLET | ORAL | 1 refills | Status: DC
Start: 2019-04-24 — End: 2019-04-24

## 2019-04-24 NOTE — Telephone Encounter (Signed)
Spoke with Caryn Bee, pharmacist at [facility]. He stated that the glipizide XL is covered, not the brand. The patient thought that she had been on the brand Glucotrol XL, but she had in fact been taking the specific manufacturer of glipizide XL. He stated that they would stick with the glipizide XL daily.

## 2019-04-24 NOTE — Telephone Encounter (Signed)
Received VMM from [facility]' pharmacy as they just received a Rx for Glucotrol 10 mg tabs regular release, but a Rx was transferred from the [facility] in [place] for Glucotrol XL.

## 2019-04-24 NOTE — Addendum Note (Signed)
Addended by: Tanya Nones on: 04/24/2019 12:23 AM     Modules accepted: Orders

## 2019-04-24 NOTE — Telephone Encounter (Signed)
Prescription has been sent.

## 2019-05-07 ENCOUNTER — Other Ambulatory Visit: Payer: Medicare Other

## 2019-05-09 ENCOUNTER — Other Ambulatory Visit (INDEPENDENT_AMBULATORY_CARE_PROVIDER_SITE_OTHER): Payer: Self-pay

## 2019-05-09 ENCOUNTER — Encounter (INDEPENDENT_AMBULATORY_CARE_PROVIDER_SITE_OTHER): Payer: Self-pay

## 2019-05-09 NOTE — Progress Notes (Signed)
NN contacted the patient via My Chart.  Left contact information for assistance with questions, concerns or scheduling a meeting. Pending response/ call back.    Waneta Martins, MSN, [facility]  [address]  [place], [place] [address]  T [phone]

## 2019-05-10 ENCOUNTER — Emergency Department: Payer: Medicare Other

## 2019-05-10 ENCOUNTER — Observation Stay
Admission: EM | Admit: 2019-05-10 | Discharge: 2019-05-11 | Disposition: A | Discharge status: Home / Self Care | Payer: Medicare Other | Attending: Internal Medicine | Admitting: Internal Medicine

## 2019-05-10 DIAGNOSIS — E669 Obesity, unspecified: Secondary | ICD-10-CM | POA: Diagnosis present

## 2019-05-10 DIAGNOSIS — E1142 Type 2 diabetes mellitus with diabetic polyneuropathy: Secondary | ICD-10-CM | POA: Insufficient documentation

## 2019-05-10 DIAGNOSIS — R2990 Unspecified symptoms and signs involving the nervous system: Secondary | ICD-10-CM | POA: Diagnosis present

## 2019-05-10 DIAGNOSIS — Z885 Allergy status to narcotic agent status: Secondary | ICD-10-CM | POA: Insufficient documentation

## 2019-05-10 DIAGNOSIS — Z9114 Patient's other noncompliance with medication regimen: Secondary | ICD-10-CM | POA: Insufficient documentation

## 2019-05-10 DIAGNOSIS — E1165 Type 2 diabetes mellitus with hyperglycemia: Secondary | ICD-10-CM | POA: Diagnosis present

## 2019-05-10 DIAGNOSIS — I160 Hypertensive urgency: Principal | ICD-10-CM | POA: Insufficient documentation

## 2019-05-10 DIAGNOSIS — E782 Mixed hyperlipidemia: Secondary | ICD-10-CM | POA: Diagnosis present

## 2019-05-10 DIAGNOSIS — I10 Essential (primary) hypertension: Secondary | ICD-10-CM | POA: Diagnosis present

## 2019-05-10 DIAGNOSIS — E785 Hyperlipidemia, unspecified: Secondary | ICD-10-CM | POA: Insufficient documentation

## 2019-05-10 DIAGNOSIS — Z8673 Personal history of transient ischemic attack (TIA), and cerebral infarction without residual deficits: Secondary | ICD-10-CM | POA: Insufficient documentation

## 2019-05-10 DIAGNOSIS — R42 Dizziness and giddiness: Secondary | ICD-10-CM | POA: Insufficient documentation

## 2019-05-10 DIAGNOSIS — Z888 Allergy status to other drugs, medicaments and biological substances status: Secondary | ICD-10-CM | POA: Insufficient documentation

## 2019-05-10 DIAGNOSIS — R110 Nausea: Secondary | ICD-10-CM | POA: Insufficient documentation

## 2019-05-10 DIAGNOSIS — R51 Headache: Secondary | ICD-10-CM | POA: Insufficient documentation

## 2019-05-10 DIAGNOSIS — Z794 Long term (current) use of insulin: Secondary | ICD-10-CM | POA: Insufficient documentation

## 2019-05-10 DIAGNOSIS — I639 Cerebral infarction, unspecified: Secondary | ICD-10-CM | POA: Diagnosis present

## 2019-05-10 LAB — CBC AND DIFFERENTIAL
Absolute NRBC: 0 10*3/uL (ref 0.00–0.00)
Basophils Absolute Automated: 0.04 10*3/uL (ref 0.00–0.08)
Basophils Automated: 0.4 %
Eosinophils Absolute Automated: 0.04 10*3/uL (ref 0.00–0.44)
Eosinophils Automated: 0.4 %
Hematocrit: 40.3 % (ref 34.7–43.7)
Hgb: 12.8 g/dL (ref 11.4–14.8)
Immature Granulocytes Absolute: 0.04 10*3/uL (ref 0.00–0.07)
Immature Granulocytes: 0.4 %
Lymphocytes Absolute Automated: 2.12 10*3/uL (ref 0.42–3.22)
Lymphocytes Automated: 20.8 %
MCH: 27.8 pg (ref 25.1–33.5)
MCHC: 31.8 g/dL (ref 31.5–35.8)
MCV: 87.4 fL (ref 78.0–96.0)
MPV: 12.7 fL — ABNORMAL HIGH (ref 8.9–12.5)
Monocytes Absolute Automated: 0.52 10*3/uL (ref 0.21–0.85)
Monocytes: 5.1 %
Neutrophils Absolute: 7.43 10*3/uL — ABNORMAL HIGH (ref 1.10–6.33)
Neutrophils: 72.9 %
Nucleated RBC: 0 /100 WBC (ref 0.0–0.0)
Platelets: 158 10*3/uL (ref 142–346)
RBC: 4.61 10*6/uL (ref 3.90–5.10)
RDW: 14 % (ref 11–15)
WBC: 10.19 10*3/uL — ABNORMAL HIGH (ref 3.10–9.50)

## 2019-05-10 LAB — COMPREHENSIVE METABOLIC PANEL
ALT: 18 U/L (ref 0–55)
AST (SGOT): 18 U/L (ref 5–34)
Albumin/Globulin Ratio: 1.1 (ref 0.9–2.2)
Albumin: 3.8 g/dL (ref 3.5–5.0)
Alkaline Phosphatase: 79 U/L (ref 37–106)
Anion Gap: 12 (ref 5.0–15.0)
BUN: 16 mg/dL (ref 7.0–19.0)
Bilirubin, Total: 0.3 mg/dL (ref 0.2–1.2)
CO2: 28 mEq/L (ref 22–29)
Calcium: 9.1 mg/dL (ref 8.5–10.5)
Chloride: 101 mEq/L (ref 100–111)
Creatinine: 0.9 mg/dL (ref 0.6–1.0)
Globulin: 3.4 g/dL (ref 2.0–3.6)
Glucose: 119 mg/dL — ABNORMAL HIGH (ref 70–100)
Potassium: 4 mEq/L (ref 3.5–5.1)
Protein, Total: 7.2 g/dL (ref 6.0–8.3)
Sodium: 141 mEq/L (ref 136–145)

## 2019-05-10 LAB — ECG 12-LEAD
Atrial Rate: 67 {beats}/min
P Axis: 39 degrees
P-R Interval: 144 ms
Q-T Interval: 418 ms
QRS Duration: 76 ms
QTC Calculation (Bezet): 441 ms
R Axis: 57 degrees
T Axis: 29 degrees
Ventricular Rate: 67 {beats}/min

## 2019-05-10 LAB — GLUCOSE WHOLE BLOOD - POCT: Whole Blood Glucose POCT: 141 mg/dL — ABNORMAL HIGH (ref 70–100)

## 2019-05-10 LAB — GFR: EGFR: >60

## 2019-05-10 LAB — APTT: PTT: 28 s (ref 23–37)

## 2019-05-10 LAB — TROPONIN I: Troponin I: <0.01 ng/mL (ref 0.00–0.05)

## 2019-05-10 LAB — PT/INR
PT INR: 1 (ref 0.9–1.1)
PT: 12.8 s (ref 12.6–15.0)

## 2019-05-10 LAB — TSH: TSH: 2.53 u[IU]/mL (ref 0.35–4.94)

## 2019-05-10 NOTE — ED Provider Notes (Addendum)
History     Chief Complaint   Patient presents with    Hypertension    Dizziness     The history is provided by the EMS personnel and the patient.   Hypertension   Severity:  Moderate  Onset quality:  Gradual  Duration:  3 days  Timing:  Intermittent  Progression:  Unchanged  Chronicity:  Recurrent  Notable PTA blood pressures:  200's   Context: medication change and stress    Context: not OTC medications used    Relieved by: took extra diovan tonight and losartan this am   Associated symptoms: dizziness and headaches ( frontal )    Associated symptoms: no abdominal pain, no blurred vision, no chest pain, no fever, no nausea, no neck pain, no palpitations, no peripheral edema, no shortness of breath, not vomiting and no weakness    Risk factors: diabetes, obesity and prior stroke    Risk factors: no cardiac disease and no tobacco use    Dizziness   Associated symptoms: headaches ( frontal )    Associated symptoms: no chest pain, no nausea, no palpitations, no shortness of breath, no vomiting and no weakness     68 year female with complaints of up and down bp and up and down glucose for 3 days, she reports compliance with meds and today took 100 mg cozaar this am instead of 50 mg and tonight took 240 mg diovan instead of 160 mg due to elevated blood pressures. She notes bp 200's systolic and usually lower than that but not sure of numbers. She also notes blood sugars 170s to 240s and she took lantus tonight 28 units and does not usually take insulin. She reports compliance with her usual oral dm meds.   She has been dizzy in the head and having frontal headaches related to her bp and blood sugars. No chest pain or sob, no vomiting, no new numbness or weakness. She had a cva with right sided deficits that are much improved from several years ago. She denies an overdose of meds just took extra due to degree of elevated bp.   Last a1c 8.5 11/22/2017 per endo note and was supposed to take lantus 10 units at bedtime  but she is not doing this     PCP Anbarasan   Neuro Unsure of name   Past Medical History:   Diagnosis Date    Anxiety     Depression     Diabetes     Hyperlipemia     Hypertensive disorder     Hypokalemia     Neuropathy     Stroke 2012    TIA (transient ischemic attack)        Past Surgical History:   Procedure Laterality Date    HYSTERECTOMY      REDUCTION MAMMAPLASTY      TONSILLECTOMY      urinary bladder         Family History   Problem Relation Age of Onset    Diabetes Mother     Diabetes Maternal Grandmother        Social  Social History     Tobacco Use    Smoking status: Never Smoker    Smokeless tobacco: Never Used   Substance Use Topics    Alcohol use: No    Drug use: No   .     Allergies   Allergen Reactions    Codeine Nausea And Vomiting    Crestor [Rosuvastatin] Rash  D/c due to severe rash and higher blood sugars    Glucosamine Hives and Nausea And Vomiting    Latex Rash    Eggs Or Egg-Derived Products      Patient had an allergy test and stated that she was allergic to eggs    Gabapentin      Dizziness and rash, irregular heart rate    Invokana [Canagliflozin]     Levemir [Insulin Detemir] Nausea And Vomiting    Metformin      diarrhea    Evaristo Bury [Insulin Degludec]     Victoza [Liraglutide] Nausea And Vomiting    Flaxseed [Linseed Oil] Rash    Omega 3 [Fish Oil] Rash    Simvastatin Rash     Patients reports able to tolerate medication and is currently taking        Home Medications     Med List Status:  In Progress Set By: Allayne Stack, RN at 05/10/2019 2242                ALPRAZolam (XANAX) 0.25 MG tablet     Take 1 tablet (0.25 mg total) by mouth nightly as needed for Sleep or Anxiety     glipiZIDE (GLUCOTROL) 10 MG tablet     Take 1 tablet (10 mg total) by mouth 2 (two) times daily before meals     insulin glargine (LANTUS SOLOSTAR) 100 UNIT/ML injection pen     Inject 10 units into skin daily at bedtime     lisinopril (PRINIVIL,ZESTRIL) 40 MG tablet     Take 1  tablet (40 mg total) by mouth daily.     losartan (COZAAR) 50 MG tablet     Take 50 mg by mouth daily     METOPROLOL SUCCINATE ER PO     Take 50 mg by mouth daily     rOPINIRole (REQUIP) 0.25 MG tablet     Take 1 tablet (0.25 mg total) by mouth nightly as needed (restless legs)     rosuvastatin (CRESTOR) 10 MG tablet     Take 10 mg by mouth daily     simvastatin (ZOCOR) 10 MG tablet     Take 10 mg by mouth nightly Patient reports she is taking a similar medication from [place]     TRADJENTA 5 MG Tab     TAKE 1 TABLET BY MOUTH EVERY DAY     valsartan (DIOVAN) 160 MG tablet     Take 160 mg by mouth daily                     Review of Systems   Constitutional: Negative for chills and fever.   HENT: Negative for congestion, rhinorrhea and sore throat.    Eyes: Negative for blurred vision.   Respiratory: Negative for shortness of breath.    Cardiovascular: Negative for chest pain and palpitations.   Gastrointestinal: Negative for abdominal pain, nausea and vomiting.   Genitourinary: Negative for dysuria.   Musculoskeletal: Negative for back pain, gait problem and neck pain.   Neurological: Positive for dizziness, numbness ( neuropathy ) and headaches ( frontal ). Negative for weakness.       Physical Exam    BP: (!) 165/91, Heart Rate: 68, Temp: 98.2 F (36.8 C), Resp Rate: 18, SpO2: 99 %, Weight: 91.9 kg    Physical Exam  Vitals signs and nursing note reviewed.   Constitutional:       General: She is not in acute distress.  Appearance: Normal appearance. She is not ill-appearing.   HENT:      Head: Normocephalic and atraumatic.      Right Ear: External ear normal.      Left Ear: External ear normal.      Nose: Nose normal.      Mouth/Throat:      Mouth: Mucous membranes are moist.      Pharynx: No oropharyngeal exudate or posterior oropharyngeal erythema.   Eyes:      Extraocular Movements: Extraocular movements intact.      Pupils: Pupils are equal, round, and reactive to light.   Neck:      Musculoskeletal: Normal  range of motion. No neck rigidity or muscular tenderness.   Cardiovascular:      Rate and Rhythm: Normal rate and regular rhythm.      Heart sounds: No murmur.   Pulmonary:      Effort: Pulmonary effort is normal. No respiratory distress.      Breath sounds: Normal breath sounds. No wheezing or rales.   Abdominal:      General: Abdomen is flat. Bowel sounds are normal. There is no distension.      Tenderness: There is no abdominal tenderness.   Musculoskeletal: Normal range of motion.         General: No swelling or tenderness.   Skin:     General: Skin is warm and dry.      Capillary Refill: Capillary refill takes less than 2 seconds.      Findings: No rash.   Neurological:      General: No focal deficit present.      Mental Status: She is alert and oriented to person, place, and time. Mental status is at baseline.      Cranial Nerves: Cranial nerves are intact. No cranial nerve deficit.      Sensory: Sensation is intact. No sensory deficit.      Motor: Motor function is intact. No weakness.      Coordination: Coordination is intact. Coordination normal.      Gait: Gait is intact. Gait normal.   Psychiatric:         Attention and Perception: Attention normal.         Mood and Affect: Mood and affect normal.         Speech: Speech normal.         Behavior: Behavior normal. Behavior is not agitated. Behavior is cooperative.         Thought Content: Thought content is not paranoid. Thought content does not include homicidal or suicidal ideation. Thought content does not include suicidal plan.           MDM and ED Course     ED Medication Orders (From admission, onward)    Start Ordered     Status Ordering Provider    05/11/19 0136 05/11/19 0135  LORazepam (ATIVAN) injection 1 mg  Once     Route: Intravenous  Ordered Dose: 1 mg     Last MAR action:  Given Reginia Forts    05/11/19 0129 05/11/19 0129  hydrALAZINE (APRESOLINE) injection 10 mg  Every 6 hours PRN     Route: Intravenous  Ordered Dose: 10 mg     Cecil Cranker Z    05/11/19 0115 05/11/19 0116    Every 6 hours PRN     Route: Intravenous  Ordered Dose: 10 mg     Discontinued ASWAD, BUKIR Z  MDM  Number of Diagnoses or Management Options  Cerebrovascular accident (CVA), unspecified mechanism:   Dizziness:   Essential hypertension:   Stroke-like symptom:   Type 2 diabetes mellitus with diabetic polyneuropathy, with long-term current use of insulin:   Diagnosis management comments: Dr. Marlane Hatcher  is the primary attending for this patient and has obtained and performed the history, PE, and medical decision making for this patient.    Oxygen saturation by pulse oximetry is 95%-100%, Normal.  Interventions: None Needed and Patient Observed.    EKG Interpretation  EKG interpreted by EDP  Compared to prior EKG dated 10/30/14   Rate: Normal  Rhythm: sinus rhythm  Axis: Normal  ST Segments: Normal ST segments  Conduction: No blocks  Impression: Normal EKG  Reginia Forts, MD   2223    When I was within 6 feet of this patient I donned the following PPE:  Surgical Mask Yes, Gloves No  , Gown No  ; Goggles No  ; Face Shield No  , 37M 6000 Respirator No  ; N95 No  .  The patient was wearing a mask during my evaluation Yes.    DDx includes tia, cva, med related, anxiety, thyroid, electrolyte disorder, doubt ich, no trauma, will check labs, urine and ct head.     Given the acute clinical presentation of dizziness and headache with hx of cva, obs for tia. Therefore pt warrants admission. Pt's condition can be life-threatening.    I spoke to hospitalist regarding admission. Dr Leonie Man     Results     Procedure Component Value Units Date/Time    Glucose Whole Blood - POCT ([id number]) Collected:  05/11/19 0104     Updated:  05/11/19 0109     Whole Blood Glucose POCT 98 mg/dL     Glucose Whole Blood - POCT ([id number])  (Abnormal) Collected:  05/11/19   0040     Updated:  05/11/19 0042     Whole Blood Glucose POCT 105 mg/dL     Urinalysis Reflex to Microscopic Exam- Reflex  to Culture ([id number])   Collected:  05/11/19 0016     Updated:  05/11/19 0025     Urine Type Urine, Clean Ca     Color, UA YELLOW     Clarity, UA CLEAR     Specific Gravity UA 1.007     Urine pH 6.0     Leukocyte Esterase, UA NEGATIVE     Nitrite, UA NEGATIVE     Protein, UR NEGATIVE     Glucose, UA NEGATIVE     Ketones UA NEGATIVE     Urobilinogen, UA 0.2 mg/dL      Bilirubin, UA NEGATIVE     Blood, UA NEGATIVE    Narrative:       Replace urinary catheter prior to obtaining the urine culture  if it has been in place for greater than or equal to 14  days:->N/A No Foley  Indications for U/A Reflex to Micro - Reflex to  Culture:->Suprapubic Pain/Tenderness or Dysuria    TSH ([id number]) Collected:  05/10/19 2308    Specimen:  Blood Updated:  05/10/19 2355     TSH 2.53 uIU/mL     Narrative:       Replace urinary catheter prior to obtaining the urine culture  if it has been in place for greater than or equal to 14  days:->N/A No Foley  Indications for U/A Reflex to Micro - Reflex to  Culture:->Suprapubic Pain/Tenderness or Dysuria  Troponin I ([id number]) Collected:  05/10/19 2308    Specimen:  Blood Updated:  05/10/19 2342     Troponin I <0.01 ng/mL     Narrative:       Replace urinary catheter prior to obtaining the urine culture  if it has been in place for greater than or equal to 14  days:->N/A No Foley  Indications for U/A Reflex to Micro - Reflex to  Culture:->Suprapubic Pain/Tenderness or Dysuria    Comprehensive metabolic panel ([id number])  (Abnormal) Collected:    05/10/19 2308    Specimen:  Blood Updated:  05/10/19 2336     Glucose 119 mg/dL      BUN 56.4 mg/dL      Creatinine 0.9 mg/dL      Sodium 332 mEq/L      Potassium 4.0 mEq/L      Chloride 101 mEq/L      CO2 28 mEq/L      Calcium 9.1 mg/dL      Protein, Total 7.2 g/dL      Albumin 3.8 g/dL      AST (SGOT) 18 U/L      ALT 18 U/L      Alkaline Phosphatase 79 U/L      Bilirubin, Total 0.3 mg/dL      Globulin 3.4 g/dL      Albumin/Globulin Ratio 1.1      Anion Gap 12.0    Narrative:       Replace urinary catheter prior to obtaining the urine culture  if it has been in place for greater than or equal to 14  days:->N/A No Foley  Indications for U/A Reflex to Micro - Reflex to  Culture:->Suprapubic Pain/Tenderness or Dysuria    GFR ([id number]) Collected:  05/10/19 2308     Updated:  05/10/19 2336     EGFR >60.0    Narrative:       Replace urinary catheter prior to obtaining the urine culture  if it has been in place for greater than or equal to 14  days:->N/A No Foley  Indications for U/A Reflex to Micro - Reflex to  Culture:->Suprapubic Pain/Tenderness or Dysuria    Prothrombin time/INR ([id number]) Collected:  05/10/19 2308    Specimen:  Blood Updated:  05/10/19 2329     PT 12.8 sec      PT INR 1.0    APTT ([id number]) Collected:  05/10/19 2308     Updated:  05/10/19 2329     PTT 28 sec     CBC and differential ([id number])  (Abnormal) Collected:  05/10/19 2308    Specimen:  Blood Updated:  05/10/19 2316     WBC 10.19 x10 3/uL      Hgb 12.8 g/dL      Hematocrit 23.7 %      Platelets 158 x10 3/uL      RBC 4.61 x10 6/uL      MCV 87.4 fL      MCH 27.8 pg      MCHC 31.8 g/dL      RDW 14 %      MPV 12.7 fL      Neutrophils 72.9 %      Lymphocytes Automated 20.8 %      Monocytes 5.1 %      Eosinophils Automated 0.4 %      Basophils Automated 0.4 %      Immature Granulocytes 0.4 %      Nucleated RBC 0.0 /100 WBC  Neutrophils Absolute 7.43 x10 3/uL      Lymphocytes Absolute Automated 2.12 x10 3/uL      Monocytes Absolute Automated 0.52 x10 3/uL      Eosinophils Absolute Automated 0.04 x10 3/uL      Basophils Absolute Automated 0.04 x10 3/uL      Immature Granulocytes Absolute 0.04 x10 3/uL      Absolute NRBC 0.00 x10 3/uL     Narrative:       Replace urinary catheter prior to obtaining the urine culture  if it has been in place for greater than or equal to 14  days:->N/A No Foley  Indications for U/A Reflex to Micro - Reflex to  Culture:->Suprapubic Pain/Tenderness or  Dysuria    Glucose Whole Blood - POCT ([id number])  (Abnormal) Collected:  05/10/19   2224     Updated:  05/10/19 2229     Whole Blood Glucose POCT 141 mg/dL         Radiology Results (24 Hour)     Procedure Component Value Units Date/Time    CT Head WO Contrast ([id number]) Collected:  05/10/19 2311    Order Status:  Completed Updated:  05/10/19 2315    Narrative:       HISTORY: Dizziness.     COMPARISON: 06/05/2018.    TECHNIQUE: CT of the head without intravenous contrast.     The following dose reduction techniques were utilized: Automated  exposure control and/or adjustment of the mA and/or kV according to  patient size, and the use of iterative reconstruction technique.    FINDINGS:     No acute intracranial hemorrhage, mass effect, loss of gray-white matter  differentiation, or evidence of acute ventricular outflow obstruction.    Minimal basal ganglial mineralization. Mild atherosclerotic  calcifications of the carotid siphons.    No depressed skull fracture or aggressive appearing osseous lesions.     Sinonasal mucosal thickening with likely postoperative changes of the  maxillary sinuses.      Impression:           No acute intracranial abnormality.     Eloise Harman, MD   05/10/2019 11:12 PM      Feeling better on re check, will obs with hospitalist                        Amount and/or Complexity of Data Reviewed  Clinical lab tests: ordered and reviewed  Tests in the radiology section of CPT: ordered and reviewed    Patient Progress  Patient progress: improved               NIH Stroke Score      Most Recent Value   Patient's calculated Stroke Score:  2 filed at 05/10/2019 2216          Procedures    Clinical Impression & Disposition     Clinical Impression  Final diagnoses:   Dizziness   Essential hypertension   Cerebrovascular accident (CVA), unspecified mechanism   Stroke-like symptom   Type 2 diabetes mellitus with diabetic polyneuropathy, with long-term current use of insulin        ED Disposition      ED Disposition Condition Date/Time Comment    Observation  Sat May 11, 2019  1:16 AM Admitting Physician: Lennox Laity [[id number]]   Diagnosis: Stroke-like symptom [578469]   Estimated Length of Stay: < 2 midnights   Tentative Discharge Plan?: Home or Self Care [1]   Patient Class:  Observation [104]             New Prescriptions    No medications on file

## 2019-05-10 NOTE — ED Triage Notes (Signed)
Pt reports 3 days of varying BPs and glucose levels. States has headache and dizziness with increased readings. Pt took 2 tablets of 50mg  cozaar this morning instead of 1, and took 1.5 tablets of diovan this evening to try to treat symptoms. Pt reports a high of over 200 sys for BP and 240's for glucose. EMS reported a sys reading of 103 PTA. Pt states she takes oral meds for diabetes, however she took 28 units of Lantus this evening for glucose in the 240's.

## 2019-05-11 DIAGNOSIS — R29818 Other symptoms and signs involving the nervous system: Secondary | ICD-10-CM

## 2019-05-11 DIAGNOSIS — Z9119 Patient's noncompliance with other medical treatment and regimen: Secondary | ICD-10-CM

## 2019-05-11 DIAGNOSIS — R2990 Unspecified symptoms and signs involving the nervous system: Secondary | ICD-10-CM | POA: Diagnosis present

## 2019-05-11 DIAGNOSIS — Z7982 Long term (current) use of aspirin: Secondary | ICD-10-CM

## 2019-05-11 DIAGNOSIS — I10 Essential (primary) hypertension: Secondary | ICD-10-CM | POA: Diagnosis present

## 2019-05-11 DIAGNOSIS — Z8673 Personal history of transient ischemic attack (TIA), and cerebral infarction without residual deficits: Secondary | ICD-10-CM

## 2019-05-11 DIAGNOSIS — F419 Anxiety disorder, unspecified: Secondary | ICD-10-CM

## 2019-05-11 DIAGNOSIS — F418 Other specified anxiety disorders: Secondary | ICD-10-CM

## 2019-05-11 DIAGNOSIS — E118 Type 2 diabetes mellitus with unspecified complications: Secondary | ICD-10-CM

## 2019-05-11 LAB — URINALYSIS REFLEX TO MICROSCOPIC EXAM - REFLEX TO CULTURE
Bilirubin, UA: NEGATIVE
Blood, UA: NEGATIVE
Glucose, UA: NEGATIVE
Ketones UA: NEGATIVE
Leukocyte Esterase, UA: NEGATIVE
Nitrite, UA: NEGATIVE
Protein, UR: NEGATIVE
Specific Gravity UA: 1.007 (ref 1.001–1.035)
Urine pH: 6 (ref 5.0–8.0)
Urobilinogen, UA: 0.2 mg/dL (ref 0.2–2.0)

## 2019-05-11 LAB — GLUCOSE WHOLE BLOOD - POCT
Whole Blood Glucose POCT: 105 mg/dL — ABNORMAL HIGH (ref 70–100)
Whole Blood Glucose POCT: 116 mg/dL — ABNORMAL HIGH (ref 70–100)
Whole Blood Glucose POCT: 138 mg/dL — ABNORMAL HIGH (ref 70–100)
Whole Blood Glucose POCT: 153 mg/dL — ABNORMAL HIGH (ref 70–100)
Whole Blood Glucose POCT: 97 mg/dL (ref 70–100)
Whole Blood Glucose POCT: 98 mg/dL (ref 70–100)

## 2019-05-11 MED ORDER — ALPRAZOLAM 0.5 MG PO TABS
0.5000 mg | ORAL_TABLET | Freq: Three times a day (TID) | ORAL | Status: DC | PRN
Start: 2019-05-11 — End: 2019-05-11

## 2019-05-11 MED ORDER — ASPIRIN 81 MG PO CHEW
81.00 mg | CHEWABLE_TABLET | Freq: Every day | ORAL | 0 refills | Status: AC
Start: 2019-05-12 — End: 2019-06-11

## 2019-05-11 MED ORDER — HYDRALAZINE HCL 20 MG/ML IJ SOLN
10.00 mg | INTRAMUSCULAR | Status: DC | PRN
Start: 2019-05-11 — End: 2019-05-11

## 2019-05-11 MED ORDER — LORAZEPAM 2 MG/ML IJ SOLN
INTRAMUSCULAR | Status: AC
Start: 2019-05-11 — End: 2019-05-11
  Administered 2019-05-11: 1 mg via INTRAVENOUS
  Filled 2019-05-11: qty 1

## 2019-05-11 MED ORDER — INSULIN GLARGINE 100 UNIT/ML SC SOLN
10.00 [IU] | Freq: Every evening | SUBCUTANEOUS | Status: DC
Start: 2019-05-11 — End: 2019-05-11

## 2019-05-11 MED ORDER — ROSUVASTATIN CALCIUM 5 MG PO TABS
10.0000 mg | ORAL_TABLET | Freq: Every day | ORAL | Status: DC
Start: 2019-05-11 — End: 2019-05-11
  Filled 2019-05-11: qty 2

## 2019-05-11 MED ORDER — AMLODIPINE BESYLATE 5 MG PO TABS
5.0000 mg | ORAL_TABLET | Freq: Every day | ORAL | Status: DC
Start: 2019-05-11 — End: 2019-05-11
  Administered 2019-05-11: 5 mg via ORAL
  Filled 2019-05-11: qty 1

## 2019-05-11 MED ORDER — INSULIN LISPRO 100 UNIT/ML SC SOLN
1.00 [IU] | Freq: Every evening | SUBCUTANEOUS | Status: DC
Start: 2019-05-11 — End: 2019-05-11

## 2019-05-11 MED ORDER — DEXTROSE 50 % IV SOLN
12.50 g | INTRAVENOUS | Status: DC | PRN
Start: 2019-05-11 — End: 2019-05-11

## 2019-05-11 MED ORDER — LORAZEPAM 2 MG/ML IJ SOLN
1.00 mg | Freq: Once | INTRAMUSCULAR | Status: AC
Start: 2019-05-11 — End: 2019-05-11

## 2019-05-11 MED ORDER — ASPIRIN 81 MG PO CHEW
81.00 mg | CHEWABLE_TABLET | Freq: Every day | ORAL | Status: DC
Start: 2019-05-11 — End: 2019-05-11
  Administered 2019-05-11: 81 mg via ORAL
  Filled 2019-05-11: qty 1

## 2019-05-11 MED ORDER — HYDRALAZINE HCL 20 MG/ML IJ SOLN
10.00 mg | Freq: Four times a day (QID) | INTRAMUSCULAR | Status: DC | PRN
Start: 2019-05-11 — End: 2019-05-11

## 2019-05-11 MED ORDER — INSULIN LISPRO 100 UNIT/ML SC SOLN
1.00 [IU] | Freq: Three times a day (TID) | SUBCUTANEOUS | Status: DC
Start: 2019-05-11 — End: 2019-05-11

## 2019-05-11 MED ORDER — HYDRALAZINE HCL 20 MG/ML IJ SOLN
INTRAMUSCULAR | Status: AC
Start: 2019-05-11 — End: 2019-05-11
  Administered 2019-05-11: 10 mg via INTRAVENOUS
  Filled 2019-05-11: qty 1

## 2019-05-11 MED ORDER — ENOXAPARIN SODIUM 40 MG/0.4ML SC SOLN
40.00 mg | Freq: Every day | SUBCUTANEOUS | Status: DC
Start: 2019-05-11 — End: 2019-05-11
  Administered 2019-05-11: 40 mg via SUBCUTANEOUS
  Filled 2019-05-11: qty 0.4

## 2019-05-11 MED ORDER — GLUCAGON 1 MG IJ SOLR (WRAP)
1.00 mg | INTRAMUSCULAR | Status: DC | PRN
Start: 2019-05-11 — End: 2019-05-11

## 2019-05-11 MED ORDER — GLIPIZIDE 5 MG PO TABS
5.0000 mg | ORAL_TABLET | Freq: Two times a day (BID) | ORAL | Status: DC
Start: 2019-05-11 — End: 2019-05-11
  Filled 2019-05-11: qty 1

## 2019-05-11 MED ORDER — GLUCOSE 40 % PO GEL
15.00 g | ORAL | Status: DC | PRN
Start: 2019-05-11 — End: 2019-05-11

## 2019-05-11 MED ORDER — ONDANSETRON HCL 4 MG/2ML IJ SOLN
4.00 mg | Freq: Once | INTRAMUSCULAR | Status: AC
Start: 2019-05-11 — End: 2019-05-11

## 2019-05-11 MED ORDER — VALSARTAN 160 MG PO TABS
160.0000 mg | ORAL_TABLET | Freq: Every day | ORAL | Status: DC
Start: 2019-05-11 — End: 2019-05-11
  Administered 2019-05-11: 160 mg via ORAL
  Filled 2019-05-11: qty 1

## 2019-05-11 MED ORDER — AMLODIPINE BESYLATE 5 MG PO TABS
5.0000 mg | ORAL_TABLET | Freq: Every day | ORAL | 0 refills | Status: DC
Start: 2019-05-12 — End: 2019-05-16

## 2019-05-11 MED ORDER — ONDANSETRON HCL 4 MG/2ML IJ SOLN
INTRAMUSCULAR | Status: AC
Start: 2019-05-11 — End: 2019-05-11
  Administered 2019-05-11: 4 mg via INTRAVENOUS
  Filled 2019-05-11: qty 2

## 2019-05-11 MED ORDER — LABETALOL HCL 5 MG/ML IV SOLN (WRAP)
10.00 mg | INTRAVENOUS | Status: DC | PRN
Start: 2019-05-11 — End: 2019-05-11

## 2019-05-11 MED ORDER — LOSARTAN POTASSIUM 25 MG PO TABS
50.0000 mg | ORAL_TABLET | Freq: Every day | ORAL | Status: DC
Start: 2019-05-11 — End: 2019-05-11

## 2019-05-11 MED ORDER — LORAZEPAM 0.5 MG PO TABS
0.2500 mg | ORAL_TABLET | Freq: Every evening | ORAL | 0 refills | Status: DC | PRN
Start: 2019-05-11 — End: 2019-05-28

## 2019-05-11 NOTE — Discharge Instr - Other Orders (Signed)
Ask3Teach3 Program    Education about New Medications and their Side effects    Dear Donalynn Furlong Aniceto Boss,    Its been a pleasure taking care of you during your hospitalization here at [facility]. We have initiated a new program to educate our patients and/or their family members or designated personnel about the new medications started by your physicians and their indications along with the possible side effects. Multiple studies have shown that patients started on new medications are often unaware of the names of the medication along with the indications and their side effects which leads to decreased compliance with the medications.    During our conversation today on 05/11/2019  5:01 PM I have explained to you the name of the new medication and the indication along with some possible common side effects. Listed below are some of the new medications started during this hospitalization.     Please call the Nurse if you have any side effects while in hospital.     Please call 911 if you have any life threatening symptoms after you are discharged from the hospital.    Please inform your Primary care physician for common side effects which are not life threatening after discharge.    Medication:  Amlodipine(Norvasc)   This Medication is used for:   High Blood Pressure   Angina     Common Side Effects are:   Headache   Dizziness   Nausea   Swelling     A note from your nurse:  Call your nurse immediately if you notice itching, hives, swelling or trouble breathing     Medication Name: Aspirin   This Medication is used for:   Stroke   Heart Attack   Atrial Fibrillation(irregular heart Beat)     Common Side Effects are:   Increased Bleeding    Upset Stomach/Nausea    Heart Burn     A note from your nurse:  Call your nurse immediately if you notice itching, hives, swelling or trouble breathing       Medication: Lorazepam(Ativan)   This Medication is used for:   Anxiety   Panic  Attack    Common Side Effects are:   Dizziness   Confusion   Drowsiness    A note from your nurse:  Call your nurse immediately if you notice itching, hives, swelling or trouble breathing         Thank you for your time.    Your Nurse    05/11/2019  5:01 PM  [facility]  [address]  [place], [place]  [address]

## 2019-05-11 NOTE — ED Notes (Signed)
Nursing Communication - Admission Information:  Diagnosis:   1. Dizziness     2. Essential hypertension     3. Cerebrovascular accident (CVA), unspecified mechanism     4. Stroke-like symptom       Mobility: stand by assistance   Isolation: No active isolations  Patient comes from: home  Neuro: alert and oriented  Special needs: none  Drips:

## 2019-05-11 NOTE — Progress Notes (Signed)
Tele removed, saline loc removed. Tried to review d/c instructions w/ pt but she refused and stated "I can read them at home". Educated on importance of reviewing d/c instructions and pt still refused. Copy of instructions given to pt. Pt d/c home, transport home by friend. Taken downstairs in wheelchair by [employer]. All belongings taken w/ pt.

## 2019-05-11 NOTE — Plan of Care (Signed)
Problem: Every Day - Stroke  Goal: Core/Quality measure requirements - Daily  Outcome: Adequate for Discharge  Flowsheets (Taken 05/11/2019 1456)  Core/Quality measure requirements - Daily:   VTE Prevention: Ensure anticoagulant(s) administered and/or anti-embolism stockings/devices documented by end of day 2   Ensure antithrombotic administered or contraindication documented by LIP by end of day 2   Once lipid panel has resulted, check LDL. Contact provider for statin order if LDL > 70 (or ensure contraindication documented by LIP).   Continue stroke education (must include Modifiable Risk Factors, Warning Signs and Symptoms of Stroke, Activation of Emergency Medical System and Follow-up Appointments). Ensure handout has been given and documented.  Goal: Neurological status is stable or improving  Outcome: Adequate for Discharge  Flowsheets (Taken 05/11/2019 1456)  Neurological status is stable or improving:   Monitor/assess/document neurological assessment (Stroke: every 4 hours)   Re-assess NIH Stroke Scale for any change in status   Observe for seizure activity and initiate seizure precautions if indicated   Monitor/assess NIH Stroke Scale   Perform CAM Assessment     Problem: Day of Discharge - Stroke  Goal: Able to express understanding of discharge instructions and stroke education  Outcome: Adequate for Discharge  Flowsheets (Taken 05/11/2019 1456)  Able to express understanding of discharge instructions and stroke education:   Provide alternative method of communication if needed   Consult/collaborate with Case Management/Social Work   Include patient care companion in decisions related to communication   Patient/patient care companion demonstrates understanding on disease process, treatment plan, medications and discharge plan   Ensure "Discharge instructions: Stroke" appears on discharge paperwork  (After Visit Summary-AVS)  Goal: Core/Quality measures - Discharge  Outcome: Adequate for  Discharge  Flowsheets (Taken 05/11/2019 1456)  Core/Quality measures - Discharge:   Document discharge NIH Stroke Scale   Document Modified Rankin Scale ([place] only)   Ensure antithrombotic ordered or contraindication documented by LIP   Ensure statin ordered or contraindication documented by LIP   If diagnosis or history of A-fib/A-flutter, ensure anticoagulation ordered or contraindication documented by LIP   Complete final verification medications: check and compare prior to admit (PTA), during hospitalization and discharge medication list   Document discharge stroke education completed and patient/family verbalizes understanding

## 2019-05-11 NOTE — OT Eval Note (Signed)
 [facility]  [address]  [place], [place]. [address]    Department of Rehabilitation Services  [phone]    Occupational Therapy Evaluation    Patient: Emily Rowe    MRN#: [id number]     [id number]    Time of treatment: Time Calculation  OT Received On: 05/11/19  Start Time: 1113  Stop Time: 1132  Time Calculation (min): 19 min  OT Visit Number: 1    Consult received for Hadassah Licia Herold Theophilus for OT Evaluation and Treatment.  Patient's medical condition is appropriate for Occupational therapy intervention at this time.    Assessment:   Wade Sigala Geroldine Esquivias is a 68 y.o. female admitted 05/10/2019.   Expanded chart review completed including review of labs, review of imaging, review of vitals and review of H&P and physician progress notes.  Pt demonstrates a stable clinical presentation. Pt appears to be at baseline for ADLs and functional transfers.    Assessment: Appears to be at baseline for ADL's;Appears to be at baseline for mobility     Complexity Chart Review Performance Deficits Clinical Decision Making Hx/Comorbidities Assistance needed   Low Brief 1-3 Limited options None None (or at baseline)   Moderate Expanded 3-5 Several Options 1-2 Min/Mod assist (not at baseline)   High Extensive 5 or more Multiple options 3 or more Max/dependent (not at baseline       Rehabilitation Potential: Prognosis: Good      Plan:   OT Frequency Recommended: one time visit - therapy discontinued   Treatment Interventions: No skilled interventions needed at this time     Patient Goal  Patient Goal: To go home    Risks/Benefits/POC Discussed with Pt/Family: With patient    Goals:                      Discharge Recommendations:   Based on today's session patient's discharge recommendation is the following: Discharge Recommendation: Home with supervision(Home with family).           Precautions and Contraindications:   Precautions  Weight Bearing Status: no  restrictions  Other Precautions: standard, falls      Medical Diagnosis: Dizziness [R42]  Stroke-like symptom [R29.90]  Essential hypertension [I10]  Cerebrovascular accident (CVA), unspecified mechanism [I63.9]  Type 2 diabetes mellitus with diabetic polyneuropathy, with long-term current use of insulin  [E11.42, Z79.4]    History of Present Illness: Emily Rowe is a 68 y.o. female admitted on 05/10/2019 with chief complaint of Hypertension and Dizziness     This is a 68 year old female with a known history of type 2 diabetes, hypertension, history of stroke 8 years ago affected her right side, presented to the hospital complaining of generalized weakness, headache and dizziness.     The patient said that for the past couple days her blood sugar and her blood pressure had been going up and down, unable to get it under control, she is very anxious, she had not been very compliant with her medications, she said today because her blood pressure was high she took 100 mg of Cozaar  instead of 50, and took to 40 mg of Diovan  instead of 160, but her blood pressure was still high, she also said that she was feeling headache, dizziness, nausea, and her neuropathy is getting worse so she decided to come to the hospital.     Patient said when her neuropathy acts up she gets very stiff, and she was also feeling nauseated and  gassy and continue to burp with no relief.     Patient denied any blurry vision, denied any change in her speech, denied any chest pain or shortness of breath or cough, no sick contact, denied any abdominal pain, no diarrhea. (Per H&P)         Patient Active Problem List   Diagnosis   . Uncontrolled type 2 diabetes mellitus with hyperglycemia   . Mixed hyperlipidemia   . Benign hypertension   . Long-term insulin  use   . Obesity (BMI 30-39.9)   . Neuropathy   . Stroke   . Stroke-like symptom   . Essential hypertension        Past Medical/Surgical History:  Past Medical History:   Diagnosis  Date   . Anxiety    . Depression    . Diabetes    . Hyperlipemia    . Hypertensive disorder    . Hypokalemia    . Neuropathy    . Stroke 2012   . TIA (transient ischemic attack)       Past Surgical History:   Procedure Laterality Date   . HYSTERECTOMY     . REDUCTION MAMMAPLASTY     . TONSILLECTOMY     . urinary bladder           X-Rays/Tests/Labs:      Social History:  Prior Level of Function  Prior level of function: Independent with ADLs, Ambulates independently  Baseline Activity Level: Household ambulation, Community ambulation  Driving: independent  DME Currently at Home: ADL- Grab Bars, [place], Single [place], [place], [employer]  Home Living Arrangements  Living Arrangements: Spouse/significant other  Type of Home: House  Home Layout: Multi-level, Bed/bath upstairs(Pt sleeps in family room on loveseat; showers upstairs)  Bathroom Shower/Tub: Pension scheme manager: Standard  Bathroom Equipment: Grab bars in Air traffic controller, Paediatric nurse  DME Currently at Home: ADL- [employer], [employer], Single [place], [place], [employer]      Subjective:   Patient is agreeable to participation in the therapy session.  Subjective: Pt received supine in bed. Pt agreeable to therapy session. Pt states I want to sit on the couch.  Pain Assessment  Pain Assessment: Numeric Scale (0-10)  Pain Score: 8-severe pain  POSS Score: Awake and Alert  Pain Location: Back  Pain Orientation: Lower  Pain Descriptors: Constant  Pain Frequency: Increases with movement  Pain Intervention(s): Repositioned;Distraction;Relaxation technique.        Objective:   Observation of Patient/Vital Signs:  Patient is in bed with telemetry in place.         Cognition/Neuro Status  Arousal/Alertness: Appropriate responses to stimuli  Attention Span: Appears intact  Orientation Level: Oriented X4  Memory: Appears intact  Following Commands: independent  Safety Awareness: independent  Insights: Educated in Engineer, building services;Fully aware of deficits  Problem Solving: Able  to problem solve independently  Behavior: cooperative;calm;attentive  Motor Planning: intact  Coordination: GMC impaired;FMC impaired(RUE)  Hand Dominance: right handed(Pt states she has learned to use L hand when needed)    Gross ROM  Right Upper Extremity ROM: needs focused assessment  Right Upper Extremity Overall ROM % reduced: reduced by 50%  Left Upper Extremity ROM: within functional limits  Gross Strength  Right Upper Extremity Strength: 2+/5  Left Upper Extremity Strength: within functional limits          Sensory  Auditory: intact  Tactile - Light Touch: intact  Visual Acuity: intact       Self-care and Home  Management  LB Dressing: Independent;Don/doff R sock;Don/doff L sock  Toileting: Supervision;grab bar use;sitting  Functional Transfers: Supervision;toilet transfer    Mobility and Transfers  Scooting to EOB: Minimal Assist(due to R sided weakness)  Supine to Sit: Supervision  Sit to Stand: Contact Guard Assist  Bed to Chair: Armed forces logistics/support/administrative officer Mobility/Ambulation: Environmental consultant Sitting Balance: good  Dyanamic Sitting Balance: fair  Static Standing Balance: good  Dynamic Standing Balance: good    Participation and Endurance  Participation Effort: good  Endurance: Tolerates 10 - 20 min exercise with multiple rests    AM-PACT 6 Clicks Daily Activity Inpatient Short Form  Inpatient AM-PACT Performed?: yes  Put On/Take Off Lower Body Clothing: None  Assist with Bathing: A little  Assist with Toileting: None  Put On/Take Off Upper Body Clothing: None  Assist with Grooming: None  Assist with Eating: None  OT Daily Activity Raw Score: 23  CMS 0-100% Score: 15.86%    PMP - Progressive Mobility Protocol   PMP Activity: Step 6 - Walks in Room    Treatment Activities: Pt received supine in bed. Pt performed bed mobility to EOB with MinA due to R sided weakness. Pt performed sit to stand with CGA. Pt ambulated throughout room and in bathroom with CGA. Pt performed toilet  transfer using grab bars and supervision. Pt returned to sit on couch with call bell and all needs within reach. RN notified of outcome session.     Educated the patient to role of occupational therapy, plan of care, goals of therapy and safety with mobility and ADLs, energy conservation techniques, home safety.      Therapist PPE during session procedural mask and gloves      Suzen Day, MS, OTR/L.

## 2019-05-11 NOTE — Progress Notes (Signed)
05/11/19 1000 05/11/19 1004 05/11/19 1008   Vital Signs   Heart Rate 93 94 99   BP 114/69 117/83 131/86   Patient Position Lying Sitting Standing

## 2019-05-11 NOTE — UM Notes (Signed)
OBS review 05/10/19    Patient placed to OBS for stroke like symptoms      Guideline 1 of 1 OC-042 Transient Ischemic Attack (TIA): Observation Care [Version: MCG 23rd Edition] Next Review Date: None Added by: Orpah Melter on 05/11/2019 11:05 AM EDT Patient Status:      Observation Care Admission Criteria [Status : Indications Met]    Most Recent Editor: Orpah Melter on 05/11/2019 11:05 AM EDT         Observation is appropriate for patient with 1 or more of the following [A] [B] [C] (1) (2) (4):      Rapid evaluation (eg, echocardiogram, carotid Doppler) in alternative setting is not feasible (eg, over next few days as outpatient is not possible)

## 2019-05-11 NOTE — Plan of Care (Signed)
Problem: Safety  Goal: Patient will be free from injury during hospitalization  Outcome: Progressing  Flowsheets (Taken 05/11/2019 0404)  Patient will be free from injury during hospitalization:   Assess patient's risk for falls and implement fall prevention plan of care per policy   Include patient/ family/ care giver in decisions related to safety   Use appropriate transfer methods   Assess for patients risk for elopement and implement Elopement Risk Plan per policy   Hourly rounding   Ensure appropriate safety devices are available at the bedside   Provide alternative method of communication if needed (communication boards, writing)     Problem: Pain  Goal: Pain at adequate level as identified by patient  Outcome: Progressing  Flowsheets (Taken 05/11/2019 0404)  Pain at adequate level as identified by patient:   Identify patient comfort function goal   Assess for risk of opioid induced respiratory depression, including snoring/sleep apnea. Alert healthcare team of risk factors identified.   Assess pain on admission, during daily assessment and/or before any "as needed" intervention(s)   Reassess pain within 30-60 minutes of any procedure/intervention, per Pain Assessment, Intervention, Reassessment (AIR) Cycle   Evaluate if patient comfort function goal is met   Evaluate patient's satisfaction with pain management progress   Offer non-pharmacological pain management interventions   Consult/collaborate with Pain Service   Include patient/patient care companion in decisions related to pain management as needed   Consult/collaborate with Physical Therapy, Occupational Therapy, and/or Speech Therapy     Problem: Day of Admission - Stroke  Goal: Core/Quality measure requirements - Admission  Outcome: Progressing  Flowsheets (Taken 05/11/2019 0403)  Core/Quality measure requirements - Admission:   Document NIH Stroke Scale on admission   Document nursing swallow/dysphagia screen on admission. If patient fails, keep  patient NPO (follow your hospital protocol on swallowing screening).   VTE Prevention: Ensure anticoagulant(s) administered and/or anti-embolism stockings/devices documented as ordered   Ensure antithrombotic administered or contraindication documented by LIP   If diagnosis or history of Atrial Fib/Atrial Flutter, ensure oral anticoagulation is initiated or contraindication documented by LIP   Ensure lipid panel ordered   Begin stroke education on admission (must include Modifiable Risk Factors, Warning Signs and Symptoms of Stroke, Activation of Emergency Medical System and Follow-up Appointments) Ensure handout has been given and documented.   Ensure PT/OT and/or SLP ordered

## 2019-05-11 NOTE — Discharge Summary (Signed)
Emily Rowe HOSPITALIST   Lebo Summary   Emily Rowe Info:   Date/Time: 05/11/2019 / 4:24 PM   Admit Date:05/10/2019  Emily Rowe Name:Emily Rowe   ZOX:[id number]   PCP: Emily Sous, MD  Attending Physician:Filbert Craze, Betsey Holiday, MD     Hospital Course:   Please see H&P for complete details of HPI and ROS. The Emily Rowe was admitted to [facility] and has been taken care as mentioned below.    Emily Rowe is a  68 year old female with a known history of type 2 diabetes, hypertension, history of stroke 8 years ago affected her right side, presented to the hospital complaining of generalized weakness, headache and dizziness. The Emily Rowe said that for the past couple days her blood sugar and her blood pressure had been going up and down, unable to get it under control, Emily Rowe is very anxious. Emily Rowe also mentioned that about her husband's affair with another women past 15 years and very stress about coping that.    1. Stroke like symptoms dizziness. Upon long discussion with Emily Rowe, it appears that Emily Rowe has this dizziness intermittently for several years.No other focal neurology, speech is very fluent and normal. Emily Rowe is obviously anxious. Emily Rowe refused MRI brain to be done. Emily Rowe is able to ambulate.         Emily Rowe is on aspirin and statin.   2. Type 2 diabetes: Uncontrolled, last hemoglobin A1c was 8.7, Emily Rowe is not adhering with her medication regimen at home, Emily Rowe follows up with her endocrinologist Dr. Arvilla Market, which is according to her note had multiple medication intolerance in the past. Emily Rowe appear to be choosing on which medicine Emily Rowe is going to take and which medicine Emily Rowe is not.   3. Hypertension: With hypertensive urgency. currently Emily Rowe is on Diovan prn only if blood pressure spike up and Cozaar, both are ARBs. I recommend Emily Rowe that Emily Rowe should not be on two ARBs but Emily Rowe insist Emily Rowe will stay on it as per her Primary care recommend. The doses are not maximum doses.  Norvasc works  well for her and Emily Rowe feels good. I will give prescription for Norvasc.   4. History of CVA: Stay on daily aspirin and statin  5. Significant anxiety: Improved with Ativan in the emergency room. Emily Rowe was on Xanax before but did not work for her anxiety. I will prescribe a few tablets of 0.5 mg Ativan and given her resources to see out Emily Rowe Psychiatry. No suicidal or homicidal thought. Denies any physical abuse at home but stressed with her husband's long affair with another women.     Disposition: vitals stable. Emily Rowe is walking in her room without issue. Emily Rowe is discharged home and I recommend to do outpatient MRI with Primary care. Emily Rowe understand there is risk of missing stroke or other acute events by refusing MRI.       Disposition: Home  Condition at Discharge and Prognosis: stable, prognosis long term is guarded  Admission Date:05/10/2019  Discharge Date: 05/11/19  Type of Admission:Observation   Code Status: Full Code  Subjective at the time of discharge:   Chief Complaint:  Hypertension and Dizziness  Occasional dizziness, no focal weakness, chest pain or shortness if breath  Objective:     Vitals:    05/11/19 1004 05/11/19 1008 05/11/19 1208 05/11/19 1531   BP: 117/83 131/86 123/63 134/80   Pulse: 94 99 86 93   Resp:   20 18   Temp:   98 F (36.7 C) 98.1  F (36.7 C)   TempSrc:   Temporal Temporal   SpO2:   96% 100%   Weight:       Height:         Physical Exam:   General: Emily Rowe is not in acute distress.     Appearance: Normal appearance. Emily Rowe is not ill-appearing.   HENT:      Head: Normocephalic and atraumatic.      Right Ear: Tympanic membrane normal.      Left Ear: Tympanic membrane normal.      Nose: Nose normal.      Mouth/Throat:      Mouth: Mucous membranes are moist.   Eyes:      Extraocular Movements: Extraocular movements intact.      Pupils: Pupils are equal, round, and reactive to light.   Neck:      Musculoskeletal: Normal range of motion and neck supple. No neck rigidity or muscular  tenderness.   Cardiovascular:      Rate and Rhythm: Normal rate and regular rhythm.      Pulses: Normal pulses.      Heart sounds: Normal heart sounds. No murmur. No gallop.    Pulmonary:      Effort: Pulmonary effort is normal. No respiratory distress.      Breath sounds: Normal breath sounds. No wheezing or rales.   Abdominal:      General: Abdomen is flat. Bowel sounds are normal. There is no distension.      Palpations: Abdomen is soft.      Tenderness: There is no abdominal tenderness. There is no guarding or rebound.   Musculoskeletal: Normal range of motion.         General: No swelling.      Right lower leg: No edema.      Left lower leg: No edema.   Skin:     General: Skin is warm.      Capillary Refill: Capillary refill takes less than 2 seconds.      Coloration: Skin is not jaundiced.   Neurological:      General: No focal deficit present.      Mental Status: Emily Rowe is alert and oriented to person, place, and time.      Cranial Nerves: No cranial nerve deficit.      Sensory: No sensory deficit.      Coordination: Coordination normal.   Psychiatric:         Mood and Affect: Mood normal.       Clinical Presentation:   History of Presenting Illness: Please refer to HPI in the Detailed H&P  Discharge Medications:   Discharge Medications:   [name], [name]   Home Medication Instructions ZOX:[id number]    Printed on:05/15/19 1325   Medication Information                      amLODIPine (NORVASC) 5 MG tablet  Take 1 tablet (5 mg total) by mouth daily             aspirin 81 MG chewable tablet  Chew 1 tablet (81 mg total) by mouth daily             glipiZIDE (GLUCOTROL) 10 MG tablet  Take 1 tablet (10 mg total) by mouth 2 (two) times daily before meals             insulin glargine (LANTUS SOLOSTAR) 100 UNIT/ML injection pen  Inject 10 units into skin daily at bedtime  LORazepam (ATIVAN) 0.5 MG tablet  Take 0.5 tablets (0.25 mg total) by mouth nightly as needed for Anxiety              losartan (COZAAR) 50 MG tablet  Take 50 mg by mouth daily             rosuvastatin (CRESTOR) 10 MG tablet  Take 10 mg by mouth daily             TRADJENTA 5 MG Tab  TAKE 1 TABLET BY MOUTH EVERY DAY             valsartan (DIOVAN) 160 MG tablet  Take 160 mg by mouth daily               Follow up recommendations:   Follow up:   Follow-up Information     Anbarasan, Lovell Sheehan, MD. Schedule an appointment as soon as possible for a visit in 7 day(s).    Specialty:  Family Medicine  Contact information:  [address] [place] [address]  [phone]             Gwendalyn Ege, MD. Schedule an appointment as soon as possible for a visit in 5 day(s).    Specialty:  Neurology  Contact information:  [address]  [place] [address]  [phone]             Tanya Nones, MD Follow up.    Specialties:  Endocrinology, Diabetes and Metabolism, Internal Medicine  Why:  as scheduled  Contact information:  [address]  [place] [address]  [phone]                  Results of Labs/imaging:   Labs have been reviewed:   Coagulation Profile:   Recent Labs   Lab 05/10/19  2308   PT 12.8   PT INR 1.0   PTT 28       CBC review:   Recent Labs   Lab 05/10/19  2308   WBC 10.19*   Hgb 12.8   Hematocrit 40.3   Platelets 158   MCV 87.4   RDW 14   Neutrophils 72.9   Lymphocytes Automated 20.8   Eosinophils Automated 0.4   Immature Granulocytes 0.4   Neutrophils Absolute 7.43*   Immature Granulocytes Absolute 0.04     Chem Review:  Recent Labs   Lab 05/10/19  2308   Sodium 141   Potassium 4.0   Chloride 101   CO2 28   BUN 16.0   Creatinine 0.9   Glucose 119*   Calcium 9.1   Bilirubin, Total 0.3   AST (SGOT) 18   ALT 18   Alkaline Phosphatase 79     Results     Procedure Component Value Units Date/Time    Glucose Whole Blood - POCT [[id number]]  (Abnormal) Collected:  05/11/19 1205     Updated:  05/11/19 1236     Whole Blood Glucose POCT 116 mg/dL     Glucose Whole Blood - POCT  [[id number]] Collected:  05/11/19 0747     Updated:  05/11/19 0813     Whole Blood Glucose POCT 97 mg/dL     Hemoglobin Z5G [[id number]] Collected:  05/11/19 0657    Specimen:  Blood Updated:  05/11/19 0701    Glucose Whole Blood - POCT [[id number]]  (Abnormal) Collected:  05/11/19 0510     Updated:  05/11/19 0630  Whole Blood Glucose POCT 138 mg/dL     Glucose Whole Blood - POCT [[id number]]  (Abnormal) Collected:  05/11/19 0229     Updated:  05/11/19 0233     Whole Blood Glucose POCT 153 mg/dL     Glucose Whole Blood - POCT [[id number]] Collected:  05/11/19 0104     Updated:  05/11/19 0109     Whole Blood Glucose POCT 98 mg/dL     Glucose Whole Blood - POCT [[id number]]  (Abnormal) Collected:  05/11/19 0040     Updated:  05/11/19 0042     Whole Blood Glucose POCT 105 mg/dL     Urinalysis Reflex to Microscopic Exam- Reflex to Culture [[id number]] Collected:  05/11/19 0016     Updated:  05/11/19 0025     Urine Type Urine, Clean Ca     Color, UA YELLOW     Clarity, UA CLEAR     Specific Gravity UA 1.007     Urine pH 6.0     Leukocyte Esterase, UA NEGATIVE     Nitrite, UA NEGATIVE     Protein, UR NEGATIVE     Glucose, UA NEGATIVE     Ketones UA NEGATIVE     Urobilinogen, UA 0.2 mg/dL      Bilirubin, UA NEGATIVE     Blood, UA NEGATIVE    Narrative:       Replace urinary catheter prior to obtaining the urine culture  if it has been in place for greater than or equal to 14  days:->N/A No Foley  Indications for U/A Reflex to Micro - Reflex to  Culture:->Suprapubic Pain/Tenderness or Dysuria    TSH [[id number]] Collected:  05/10/19 2308    Specimen:  Blood Updated:  05/10/19 2355     TSH 2.53 uIU/mL     Narrative:       Replace urinary catheter prior to obtaining the urine culture  if it has been in place for greater than or equal to 14  days:->N/A No Foley  Indications for U/A Reflex to Micro - Reflex to  Culture:->Suprapubic Pain/Tenderness or Dysuria    Troponin I [[id number]] Collected:  05/10/19 2308    Specimen:  Blood  Updated:  05/10/19 2342     Troponin I <0.01 ng/mL     Narrative:       Replace urinary catheter prior to obtaining the urine culture  if it has been in place for greater than or equal to 14  days:->N/A No Foley  Indications for U/A Reflex to Micro - Reflex to  Culture:->Suprapubic Pain/Tenderness or Dysuria    Comprehensive metabolic panel [[id number]]  (Abnormal) Collected:  05/10/19 2308    Specimen:  Blood Updated:  05/10/19 2336     Glucose 119 mg/dL      BUN 56.4 mg/dL      Creatinine 0.9 mg/dL      Sodium 332 mEq/L      Potassium 4.0 mEq/L      Chloride 101 mEq/L      CO2 28 mEq/L      Calcium 9.1 mg/dL      Protein, Total 7.2 g/dL      Albumin 3.8 g/dL      AST (SGOT) 18 U/L      ALT 18 U/L      Alkaline Phosphatase 79 U/L      Bilirubin, Total 0.3 mg/dL      Globulin 3.4 g/dL      Albumin/Globulin Ratio 1.1     Anion  Gap 12.0    Narrative:       Replace urinary catheter prior to obtaining the urine culture  if it has been in place for greater than or equal to 14  days:->N/A No Foley  Indications for U/A Reflex to Micro - Reflex to  Culture:->Suprapubic Pain/Tenderness or Dysuria    GFR [[id number]] Collected:  05/10/19 2308     Updated:  05/10/19 2336     EGFR >60.0    Narrative:       Replace urinary catheter prior to obtaining the urine culture  if it has been in place for greater than or equal to 14  days:->N/A No Foley  Indications for U/A Reflex to Micro - Reflex to  Culture:->Suprapubic Pain/Tenderness or Dysuria    Prothrombin time/INR [[id number]] Collected:  05/10/19 2308    Specimen:  Blood Updated:  05/10/19 2329     PT 12.8 sec      PT INR 1.0    APTT [[id number]] Collected:  05/10/19 2308     Updated:  05/10/19 2329     PTT 28 sec     CBC and differential [[id number]]  (Abnormal) Collected:  05/10/19 2308    Specimen:  Blood Updated:  05/10/19 2316     WBC 10.19 x10 3/uL      Hgb 12.8 g/dL      Hematocrit 33.2 %      Platelets 158 x10 3/uL      RBC 4.61 x10 6/uL      MCV 87.4 fL      MCH 27.8 pg       MCHC 31.8 g/dL      RDW 14 %      MPV 12.7 fL      Neutrophils 72.9 %      Lymphocytes Automated 20.8 %      Monocytes 5.1 %      Eosinophils Automated 0.4 %      Basophils Automated 0.4 %      Immature Granulocytes 0.4 %      Nucleated RBC 0.0 /100 WBC      Neutrophils Absolute 7.43 x10 3/uL      Lymphocytes Absolute Automated 2.12 x10 3/uL      Monocytes Absolute Automated 0.52 x10 3/uL      Eosinophils Absolute Automated 0.04 x10 3/uL      Basophils Absolute Automated 0.04 x10 3/uL      Immature Granulocytes Absolute 0.04 x10 3/uL      Absolute NRBC 0.00 x10 3/uL     Narrative:       Replace urinary catheter prior to obtaining the urine culture  if it has been in place for greater than or equal to 14  days:->N/A No Foley  Indications for U/A Reflex to Micro - Reflex to  Culture:->Suprapubic Pain/Tenderness or Dysuria    Glucose Whole Blood - POCT [[id number]]  (Abnormal) Collected:  05/10/19 2224     Updated:  05/10/19 2229     Whole Blood Glucose POCT 141 mg/dL         Radiology reports have been reviewed:  Radiology Results (24 Hour)     Procedure Component Value Units Date/Time    CT Head WO Contrast [[id number]] Collected:  05/10/19 2311    Order Status:  Completed Updated:  05/10/19 2315    Narrative:       HISTORY: Dizziness.     COMPARISON: 06/05/2018.    TECHNIQUE: CT of the head without intravenous contrast.  The following dose reduction techniques were utilized: Automated  exposure control and/or adjustment of the mA and/or kV according to  Emily Rowe size, and the use of iterative reconstruction technique.    FINDINGS:     No acute intracranial hemorrhage, mass effect, loss of gray-white matter  differentiation, or evidence of acute ventricular outflow obstruction.    Minimal basal ganglial mineralization. Mild atherosclerotic  calcifications of the carotid siphons.    No depressed skull fracture or aggressive appearing osseous lesions.     Sinonasal mucosal thickening with likely postoperative changes  of the  maxillary sinuses.      Impression:           No acute intracranial abnormality.     Eloise Harman, MD   05/10/2019 11:12 PM        Ct Head Wo Contrast    Result Date: 05/10/2019  HISTORY: Dizziness. COMPARISON: 06/05/2018. TECHNIQUE: CT of the head without intravenous contrast. The following dose reduction techniques were utilized: Automated exposure control and/or adjustment of the mA and/or kV according to Emily Rowe size, and the use of iterative reconstruction technique. FINDINGS: No acute intracranial hemorrhage, mass effect, loss of gray-white matter differentiation, or evidence of acute ventricular outflow obstruction. Minimal basal ganglial mineralization. Mild atherosclerotic calcifications of the carotid siphons. No depressed skull fracture or aggressive appearing osseous lesions. Sinonasal mucosal thickening with likely postoperative changes of the maxillary sinuses.     No acute intracranial abnormality. Eloise Harman, MD  05/10/2019 11:12 PM    Pathology:   Specimens (From admission, onward)    None        Pending Lab Results:   Labs/Images to be followed at your PCP office:   Unresulted Labs     Procedure . . . Date/Time    Hemoglobin A1C [[id number]] Collected:  05/11/19 0657    Specimen:  Blood Updated:  05/11/19 0701        Hospitalist:   Signed by: Yazmyne Sara Elmon Else Rula [place]  05/11/2019 4:24 PM  Time spent for discharge: 48 minutes      *This note was generated by the Epic EMR system/ Dragon speech recognition and may contain inherent errors or omissions not intended by the user. Grammatical errors, random word insertions, deletions, pronoun errors and incomplete sentences are occasional consequences of this technology due to software limitations. Not all errors are caught or corrected. If there are questions or concerns about the content of this note or information contained within the body of this dictation they should be addressed directly with the author for clarification

## 2019-05-11 NOTE — Progress Notes (Signed)
Pt states she does not want MRI here in the hospital, she is miserable and wants to d/c home and follow up out patient. MD aware.

## 2019-05-11 NOTE — Progress Notes (Signed)
Patient to return home today. OP psych resources placed on AVS. No further needs identified.       Cyndie Chime  Discharge Planner  [phone]

## 2019-05-11 NOTE — Progress Notes (Signed)
Pt admitted from [facility] alert & verbally responsive. Oriented x 4. Admission care rendered. Assessment done. Denies chest pain. No sob. Vitals stable at this time. NIH done & scored 1, pt has residual weakness on the right arm & leg from the stroke in 2012. Dysphagia screening done & passed. Neuro checks done with G[facility] of 15. Dr Leonie Man came in & assessed the pt. Orders made & carried out. MRI screening done. Awaiting for the call. Needs attended. Call light within reach. Will continue to monitor.

## 2019-05-11 NOTE — Progress Notes (Signed)
05/11/19 1654   mRS Scale   Assessment Source Patient   Could you live alone without any help from another person?  This means being able to bathe, use the toilet, shop, prepare or get meals, and manage finances. Yes   Are you able to do everything that you were doing right before your stroke, even if slower and not as much? Yes   Are you completely back to the way you were right before your stroke?  Yes   Are you able to walk without help from another person? Yes   Do you normally have someone available at all times to help with meals, using the toilet or walking? No   mRS Total 0

## 2019-05-11 NOTE — SLP Plan of Care Note (Signed)
SLP approached as pt about to d/c. Spoke with pt, who reports no acute changes or concerns re: speech, cognition, or swallow. Pt has been tolerating regular solid/thin liquid diet since admission with no difficulty. Pt drank water present at bedside with no overt s/s of aspiration. No skilled ST needs at this time.     Benjie Karvonen MS CCC-SLP

## 2019-05-11 NOTE — Discharge Instr - AVS First Page (Addendum)
Reason for your Hospital Admission:  1. Hypertension urgency  2. Significant anxiety  3. Dizziness, chronic  4. Type 2 diabetes with neuropathy: Uncontrolled, last hemoglobin A1c was 8.7  5.  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 Activity as tolerated  2. Diet: Diabetic diet, recommend to keep yourself well hydrated  3. Please return to emergency department if you have symptoms including but not limited to chest pain, shortness of breath, headache, worsening dizziness, panic attack, having thought of hurting yourself or to others or any other concerning symptoms  4. Follow up with Primary care physician after hospital discharge, follow up for blood pressure and blood sugar control, anxiety. Follow up MRI brain for dizziness  5. Follow up with Neurologist for neuropathy and dizziness  6. Follow up with your Endocrinologist as scheduled  7. Follow up with Psychiatry as anxiety is significant  8. Medication change: start Norvasc for high blood pressure, lorazepam for anxiety. Metoprolol is discontinued. Please learn information and side effects of Lorazepam and Norvasc. Recommend not driving vehicle after taking ativan as it may cause sleepiness.     For assistance in finding a psychiatrist please follow up with these resources;    1. [facility]       [phone]    2. [email or web address]    3. [email or web address]

## 2019-05-11 NOTE — H&P (Signed)
[facility] H&P Note    Patient Info:   Date/Time: 05/11/2019 / 4:12 AM   Admit Date:05/10/2019  Patient Name:Emily Rowe   ZOX:[id number]   PCP: Sabas Sous, MD  Attending Physician:Kathyleen Radice, Jerelene Redden, MD  Assessment/ Plan:   1. Strokelike symptoms: Patient is feeling generally weak, neuropathy is getting worse, she has a known history of CVA 8 years ago, presented with headache and dizziness, patient will be placed on observation, on telemetry per stroke protocol, MRI of the brain without contrast was ordered, continue neurochecks.  2. Type 2 diabetes: Uncontrolled, last hemoglobin A1c was 8.7, she is not adhering with her medication regimen at home, she follows up with her endocrinologist Dr. Arvilla Market, which is according to her note had multiple medication intolerance in the past.  3. Hypertension: With hypertensive urgency, not compliant with her blood pressure medications, blood pressure is controlled now, currently she is on Diovan and Cozaar, both are ARBs, I will continue her on Diovan, [place] Cozaar, add Norvasc, continue monitoring.  4. History of CVA: Start on daily aspirin  5. Anxiety: Improved with Ativan in the emergency room, I will start her on Xanax.    DVT Prohylaxis:lovenox and SEDs   Code Status: Full Code  Disposition:home  Type of Admission:Observation  Estimated Length of Stay (including stay in the ER receiving treatment): 1-2 days    Clinical Information and History:   Chief Complaint:  Chief Complaint   Patient presents with    Hypertension    Dizziness     Past Medical History:  Past Medical History:   Diagnosis Date    Anxiety     Depression     Diabetes     Hyperlipemia     Hypertensive disorder     Hypokalemia     Neuropathy     Stroke 2012    TIA (transient ischemic attack)      Past Surgical History:  Past Surgical History:   Procedure Laterality Date    HYSTERECTOMY      REDUCTION MAMMAPLASTY      TONSILLECTOMY      urinary  bladder       Family History:  Family History   Problem Relation Age of Onset    Diabetes Mother     Diabetes Maternal Grandmother      Social History:  Social History     Substance and Sexual Activity   Alcohol Use No     Social History     Substance and Sexual Activity   Drug Use No     Social History     Tobacco Use   Smoking Status Never Smoker   Smokeless Tobacco Never Used     Social History     Socioeconomic History    Marital status: Married     Spouse name: None    Number of children: None    Years of education: None    Highest education level: None   Occupational History    None   Social Engineer, site strain: None    Food insecurity     Worry: None     Inability: None    Transportation needs     Medical: None     Non-medical: None   Tobacco Use    Smoking status: Never Smoker    Smokeless tobacco: Never Used   Substance and Sexual Activity    Alcohol use: No    Drug use: No  Sexual activity: None     Comment: married, not working ,   Lifestyle    Physical activity     Days per week: None     Minutes per session: None    Stress: None   Relationships    Social connections     Talks on phone: None     Gets together: None     Attends religious service: None     Active member of club or organization: None     Attends meetings of clubs or organizations: None     Relationship status: None    Intimate partner violence     Fear of current or ex partner: None     Emotionally abused: None     Physically abused: None     Forced sexual activity: None   Other Topics Concern    None   Social History Narrative    None     Allergies:  Allergies   Allergen Reactions    Codeine Nausea And Vomiting    Crestor [Rosuvastatin] Rash     D/c due to severe rash and higher blood sugars    Glucosamine Hives and Nausea And Vomiting    Latex Rash    Eggs Or Egg-Derived Products      Patient had an allergy test and stated that she was allergic to eggs    Gabapentin      Dizziness and rash,  irregular heart rate    Invokana [Canagliflozin]     Levemir [Insulin Detemir] Nausea And Vomiting    Metformin      diarrhea    Evaristo Bury [Insulin Degludec]     Victoza [Liraglutide] Nausea And Vomiting    Flaxseed [Linseed Oil] Rash    Omega 3 [Fish Oil] Rash    Simvastatin Rash     Patients reports able to tolerate medication and is currently taking      Medications:  Medications Prior to Admission   Medication Sig Dispense Refill Last Dose    glipiZIDE (GLUCOTROL) 10 MG tablet Take 1 tablet (10 mg total) by mouth 2 (two) times daily before meals 180 tablet 1 05/10/2019 at Unknown time    insulin glargine (LANTUS SOLOSTAR) 100 UNIT/ML injection pen Inject 10 units into skin daily at bedtime 15 mL 0 05/10/2019 at pm    losartan (COZAAR) 50 MG tablet Take 50 mg by mouth daily   05/10/2019 at Unknown time    rosuvastatin (CRESTOR) 10 MG tablet Take 10 mg by mouth daily   05/10/2019 at Unknown time    TRADJENTA 5 MG Tab TAKE 1 TABLET BY MOUTH EVERY DAY 90 tablet 1 05/11/2019 at Unknown time    valsartan (DIOVAN) 160 MG tablet Take 160 mg by mouth daily       ALPRAZolam (XANAX) 0.25 MG tablet Take 1 tablet (0.25 mg total) by mouth nightly as needed for Sleep or Anxiety 14 tablet 0 Not Taking    lisinopril (PRINIVIL,ZESTRIL) 40 MG tablet Take 1 tablet (40 mg total) by mouth daily. 90 tablet 3 Taking    METOPROLOL SUCCINATE ER PO Take 50 mg by mouth daily   Not Taking    rOPINIRole (REQUIP) 0.25 MG tablet Take 1 tablet (0.25 mg total) by mouth nightly as needed (restless legs) 10 tablet 0 Not Taking    simvastatin (ZOCOR) 10 MG tablet Take 10 mg by mouth nightly Patient reports she is taking a similar medication from [place]   Taking     Clinical Presentation:  HPI:   Raymond Azure is a 68 y.o. female who has history of   Past Surgical History:   Procedure Laterality Date    HYSTERECTOMY      REDUCTION MAMMAPLASTY      TONSILLECTOMY      urinary bladder        Past Medical History:    Diagnosis Date    Anxiety     Depression     Diabetes     Hyperlipemia     Hypertensive disorder     Hypokalemia     Neuropathy     Stroke 2012    TIA (transient ischemic attack)     came with the chief complaint of Hypertension and Dizziness    This is a 68 year old female with a known history of type 2 diabetes, hypertension, history of stroke 8 years ago affected her right side, presented to the hospital complaining of generalized weakness, headache and dizziness.    The patient said that for the past couple days her blood sugar and her blood pressure had been going up and down, unable to get it under control, she is very anxious, she had not been very compliant with her medications, she said today because her blood pressure was high she took 100 mg of Cozaar instead of 50, and took to 40 mg of Diovan instead of 160, but her blood pressure was still high, she also said that she was feeling headache, dizziness, nausea, and her neuropathy is getting worse so she decided to come to the hospital.    Patient said when her neuropathy acts up she gets very stiff, and she was also feeling nauseated and gassy and continue to burp with no relief.    Patient denied any blurry vision, denied any change in her speech, denied any chest pain or shortness of breath or cough, no sick contact, denied any abdominal pain, no diarrhea.      Review of Systems:   Chief Complaint:  Hypertension and Dizziness    Review of Systems   Constitutional: Positive for malaise/fatigue. Negative for chills and fever.   HENT: Negative.    Eyes: Negative for blurred vision.   Respiratory: Negative for cough, shortness of breath and wheezing.    Cardiovascular: Negative for chest pain, palpitations and leg swelling.   Gastrointestinal: Positive for heartburn and nausea. Negative for abdominal pain, constipation, diarrhea and vomiting.   Genitourinary: Negative for dysuria and hematuria.   Musculoskeletal: Negative for joint pain and  myalgias.   Skin: Negative.    Neurological: Positive for dizziness, tingling, sensory change, weakness and headaches. Negative for tremors, speech change and focal weakness.   Endo/Heme/Allergies: Negative.    Psychiatric/Behavioral: Negative for depression.     Physical Exam:     Vitals:    05/11/19 0117 05/11/19 0133 05/11/19 0200 05/11/19 0344   BP: (!) 210/120 (!) 212/109 137/89 135/76   Pulse: 87 84 69 82   Resp: 13 19 20 20    Temp:    97.4 F (36.3 C)   TempSrc:    Temporal   SpO2: 99% 99% 100% 94%   Weight:         Physical Exam:   Physical Exam  Constitutional:       General: She is not in acute distress.     Appearance: Normal appearance. She is not ill-appearing.   HENT:      Head: Normocephalic and atraumatic.      Right Ear: Tympanic membrane  normal.      Left Ear: Tympanic membrane normal.      Nose: Nose normal.      Mouth/Throat:      Mouth: Mucous membranes are moist.   Eyes:      Extraocular Movements: Extraocular movements intact.      Pupils: Pupils are equal, round, and reactive to light.   Neck:      Musculoskeletal: Normal range of motion and neck supple. No neck rigidity or muscular tenderness.   Cardiovascular:      Rate and Rhythm: Normal rate and regular rhythm.      Pulses: Normal pulses.      Heart sounds: Normal heart sounds. No murmur. No gallop.    Pulmonary:      Effort: Pulmonary effort is normal. No respiratory distress.      Breath sounds: Normal breath sounds. No wheezing or rales.   Abdominal:      General: Abdomen is flat. Bowel sounds are normal. There is no distension.      Palpations: Abdomen is soft.      Tenderness: There is no abdominal tenderness. There is no guarding or rebound.   Musculoskeletal: Normal range of motion.         General: No swelling.      Right lower leg: No edema.      Left lower leg: No edema.   Skin:     General: Skin is warm.      Capillary Refill: Capillary refill takes less than 2 seconds.      Coloration: Skin is not jaundiced.   Neurological:       General: No focal deficit present.      Mental Status: She is alert and oriented to person, place, and time.      Cranial Nerves: No cranial nerve deficit.      Sensory: No sensory deficit.      Coordination: Coordination normal.   Psychiatric:         Mood and Affect: Mood normal.       Results of Labs/imaging:   Labs have been reviewed:   Coagulation Profile:   Recent Labs   Lab 05/10/19  2308   PT 12.8   PT INR 1.0   PTT 28       CBC review:   Recent Labs   Lab 05/10/19  2308   WBC 10.19*   Hgb 12.8   Hematocrit 40.3   Platelets 158   MCV 87.4   RDW 14   Neutrophils 72.9   Lymphocytes Automated 20.8   Eosinophils Automated 0.4   Immature Granulocytes 0.4   Neutrophils Absolute 7.43*   Immature Granulocytes Absolute 0.04     Chem Review:  Recent Labs   Lab 05/10/19  2308   Sodium 141   Potassium 4.0   Chloride 101   CO2 28   BUN 16.0   Creatinine 0.9   Glucose 119*   Calcium 9.1   Bilirubin, Total 0.3   AST (SGOT) 18   ALT 18   Alkaline Phosphatase 79     Results     Procedure Component Value Units Date/Time    Glucose Whole Blood - POCT [[id number]]  (Abnormal) Collected:  05/11/19 0229     Updated:  05/11/19 0233     Whole Blood Glucose POCT 153 mg/dL     Glucose Whole Blood - POCT [[id number]] Collected:  05/11/19 0104     Updated:  05/11/19 0109  Whole Blood Glucose POCT 98 mg/dL     Glucose Whole Blood - POCT [[id number]]  (Abnormal) Collected:  05/11/19 0040     Updated:  05/11/19 0042     Whole Blood Glucose POCT 105 mg/dL     Urinalysis Reflex to Microscopic Exam- Reflex to Culture [[id number]] Collected:  05/11/19 0016     Updated:  05/11/19 0025     Urine Type Urine, Clean Ca     Color, UA YELLOW     Clarity, UA CLEAR     Specific Gravity UA 1.007     Urine pH 6.0     Leukocyte Esterase, UA NEGATIVE     Nitrite, UA NEGATIVE     Protein, UR NEGATIVE     Glucose, UA NEGATIVE     Ketones UA NEGATIVE     Urobilinogen, UA 0.2 mg/dL      Bilirubin, UA NEGATIVE     Blood, UA NEGATIVE    Narrative:        Replace urinary catheter prior to obtaining the urine culture  if it has been in place for greater than or equal to 14  days:->N/A No Foley  Indications for U/A Reflex to Micro - Reflex to  Culture:->Suprapubic Pain/Tenderness or Dysuria    TSH [[id number]] Collected:  05/10/19 2308    Specimen:  Blood Updated:  05/10/19 2355     TSH 2.53 uIU/mL     Narrative:       Replace urinary catheter prior to obtaining the urine culture  if it has been in place for greater than or equal to 14  days:->N/A No Foley  Indications for U/A Reflex to Micro - Reflex to  Culture:->Suprapubic Pain/Tenderness or Dysuria    Troponin I [[id number]] Collected:  05/10/19 2308    Specimen:  Blood Updated:  05/10/19 2342     Troponin I <0.01 ng/mL     Narrative:       Replace urinary catheter prior to obtaining the urine culture  if it has been in place for greater than or equal to 14  days:->N/A No Foley  Indications for U/A Reflex to Micro - Reflex to  Culture:->Suprapubic Pain/Tenderness or Dysuria    Comprehensive metabolic panel [[id number]]  (Abnormal) Collected:  05/10/19 2308    Specimen:  Blood Updated:  05/10/19 2336     Glucose 119 mg/dL      BUN 24.4 mg/dL      Creatinine 0.9 mg/dL      Sodium 010 mEq/L      Potassium 4.0 mEq/L      Chloride 101 mEq/L      CO2 28 mEq/L      Calcium 9.1 mg/dL      Protein, Total 7.2 g/dL      Albumin 3.8 g/dL      AST (SGOT) 18 U/L      ALT 18 U/L      Alkaline Phosphatase 79 U/L      Bilirubin, Total 0.3 mg/dL      Globulin 3.4 g/dL      Albumin/Globulin Ratio 1.1     Anion Gap 12.0    Narrative:       Replace urinary catheter prior to obtaining the urine culture  if it has been in place for greater than or equal to 14  days:->N/A No Foley  Indications for U/A Reflex to Micro - Reflex to  Culture:->Suprapubic Pain/Tenderness or Dysuria    GFR [[id number]] Collected:  05/10/19 2308  Updated:  05/10/19 2336     EGFR >60.0    Narrative:       Replace urinary catheter prior to obtaining the urine  culture  if it has been in place for greater than or equal to 14  days:->N/A No Foley  Indications for U/A Reflex to Micro - Reflex to  Culture:->Suprapubic Pain/Tenderness or Dysuria    Prothrombin time/INR [[id number]] Collected:  05/10/19 2308    Specimen:  Blood Updated:  05/10/19 2329     PT 12.8 sec      PT INR 1.0    APTT [[id number]] Collected:  05/10/19 2308     Updated:  05/10/19 2329     PTT 28 sec     CBC and differential [[id number]]  (Abnormal) Collected:  05/10/19 2308    Specimen:  Blood Updated:  05/10/19 2316     WBC 10.19 x10 3/uL      Hgb 12.8 g/dL      Hematocrit 46.9 %      Platelets 158 x10 3/uL      RBC 4.61 x10 6/uL      MCV 87.4 fL      MCH 27.8 pg      MCHC 31.8 g/dL      RDW 14 %      MPV 12.7 fL      Neutrophils 72.9 %      Lymphocytes Automated 20.8 %      Monocytes 5.1 %      Eosinophils Automated 0.4 %      Basophils Automated 0.4 %      Immature Granulocytes 0.4 %      Nucleated RBC 0.0 /100 WBC      Neutrophils Absolute 7.43 x10 3/uL      Lymphocytes Absolute Automated 2.12 x10 3/uL      Monocytes Absolute Automated 0.52 x10 3/uL      Eosinophils Absolute Automated 0.04 x10 3/uL      Basophils Absolute Automated 0.04 x10 3/uL      Immature Granulocytes Absolute 0.04 x10 3/uL      Absolute NRBC 0.00 x10 3/uL     Narrative:       Replace urinary catheter prior to obtaining the urine culture  if it has been in place for greater than or equal to 14  days:->N/A No Foley  Indications for U/A Reflex to Micro - Reflex to  Culture:->Suprapubic Pain/Tenderness or Dysuria    Glucose Whole Blood - POCT [[id number]]  (Abnormal) Collected:  05/10/19 2224     Updated:  05/10/19 2229     Whole Blood Glucose POCT 141 mg/dL         Radiology reports have been reviewed:  Radiology Results (24 Hour)     Procedure Component Value Units Date/Time    CT Head WO Contrast [[id number]] Collected:  05/10/19 2311    Order Status:  Completed Updated:  05/10/19 2315    Narrative:       HISTORY: Dizziness.      COMPARISON: 06/05/2018.    TECHNIQUE: CT of the head without intravenous contrast.     The following dose reduction techniques were utilized: Automated  exposure control and/or adjustment of the mA and/or kV according to  patient size, and the use of iterative reconstruction technique.    FINDINGS:     No acute intracranial hemorrhage, mass effect, loss of gray-white matter  differentiation, or evidence of acute ventricular outflow obstruction.    Minimal basal ganglial mineralization. Mild  atherosclerotic  calcifications of the carotid siphons.    No depressed skull fracture or aggressive appearing osseous lesions.     Sinonasal mucosal thickening with likely postoperative changes of the  maxillary sinuses.      Impression:           No acute intracranial abnormality.     Eloise Harman, MD   05/10/2019 11:12 PM        EKG: EKG reviewed  Last EKG Result     Procedure Component Value Units Date/Time    ECG 12 Lead [[id number]] Collected:  05/10/19 2223     Updated:  05/10/19 2341     Ventricular Rate 67 BPM      Atrial Rate 67 BPM      P-R Interval 144 ms      QRS Duration 76 ms      Q-T Interval 418 ms      QTC Calculation (Bezet) 441 ms      P Axis 39 degrees      R Axis 57 degrees      T Axis 29 degrees     Narrative:       NORMAL SINUS RHYTHM  NORMAL ECG          Hospitalist:   Signed by:   Lennox Laity  05/11/2019 4:12 AM    *This note was generated by the Epic EMR system/ Dragon speech recognition and may contain inherent errors or omissions not intended by the user. Grammatical errors, random word insertions, deletions, pronoun errors and incomplete sentences are occasional consequences of this technology due to software limitations. Not all errors are caught or corrected. If there are questions or concerns about the content of this note or information contained within the body of this dictation they should be addressed directly with the author for clarification.

## 2019-05-11 NOTE — Plan of Care (Signed)
Problem: Day of Admission - Stroke  Goal: Core/Quality measure requirements - Admission  Outcome: Progressing  Flowsheets (Taken 05/11/2019 0403)  Core/Quality measure requirements - Admission:   Document NIH Stroke Scale on admission   Document nursing swallow/dysphagia screen on admission. If patient fails, keep patient NPO (follow your hospital protocol on swallowing screening).   VTE Prevention: Ensure anticoagulant(s) administered and/or anti-embolism stockings/devices documented as ordered   Ensure antithrombotic administered or contraindication documented by LIP   If diagnosis or history of Atrial Fib/Atrial Flutter, ensure oral anticoagulation is initiated or contraindication documented by LIP   Ensure lipid panel ordered   Begin stroke education on admission (must include Modifiable Risk Factors, Warning Signs and Symptoms of Stroke, Activation of Emergency Medical System and Follow-up Appointments) Ensure handout has been given and documented.   Ensure PT/OT and/or SLP ordered

## 2019-05-11 NOTE — Progress Notes (Signed)
05/11/19 1653   NIH Stroke Scale   Interval Discharge   Level of Consciousness (1a. ) 0   Question - age, month 0   Commands; Open and close eyes; Grip and release good hand 0   Gaze 0   Visual Fields 0   Facial Palsy 0   Motor Left Arm:  Arms (palm down) x 10 seconds sitting = 90 degrees supine = 45 degress 0   Motor Right Arm:  Arms (palm down) x 10 seconds sitting = 90 degrees supine = 45 degress 0   Total Motor Arms 0   Motor Left Leg x 5 seconds supine = 30 degrees 0   Motor Right  Leg x 5 seconds supine = 30 degrees 0   Total Motor Legs 0   Limb Ataxia finger to nose 0   Sensory to pin prick 1  (numbness on right side from prior stroke)   Best language describe picture, name items 0   Dysarthria reads words from list 0   Extinction and Inattention 0   NIHSS Total 1

## 2019-05-12 LAB — HEMOGLOBIN A1C
Average Estimated Glucose: 165.7 mg/dL
Hemoglobin A1C: 7.4 % — ABNORMAL HIGH (ref 4.6–5.9)

## 2019-05-16 ENCOUNTER — Encounter (INDEPENDENT_AMBULATORY_CARE_PROVIDER_SITE_OTHER): Payer: Self-pay | Admitting: Family Medicine

## 2019-05-16 ENCOUNTER — Ambulatory Visit (INDEPENDENT_AMBULATORY_CARE_PROVIDER_SITE_OTHER): Payer: Medicare Other | Admitting: Family Medicine

## 2019-05-16 VITALS — BP 120/77 | HR 79 | Temp 98.1°F | Resp 16 | Wt 197.8 lb

## 2019-05-16 DIAGNOSIS — E119 Type 2 diabetes mellitus without complications: Secondary | ICD-10-CM

## 2019-05-16 DIAGNOSIS — E782 Mixed hyperlipidemia: Secondary | ICD-10-CM

## 2019-05-16 DIAGNOSIS — E1142 Type 2 diabetes mellitus with diabetic polyneuropathy: Secondary | ICD-10-CM

## 2019-05-16 DIAGNOSIS — Z794 Long term (current) use of insulin: Secondary | ICD-10-CM

## 2019-05-16 DIAGNOSIS — I10 Essential (primary) hypertension: Secondary | ICD-10-CM

## 2019-05-16 MED ORDER — AMLODIPINE BESYLATE 5 MG PO TABS
5.00 mg | ORAL_TABLET | Freq: Every day | ORAL | 0 refills | Status: AC
Start: 2019-05-16 — End: 2019-06-15

## 2019-05-16 MED ORDER — LOSARTAN POTASSIUM 50 MG PO TABS
50.0000 mg | ORAL_TABLET | Freq: Two times a day (BID) | ORAL | 0 refills | Status: DC
Start: 2019-05-16 — End: 2019-08-13

## 2019-05-16 NOTE — Progress Notes (Signed)
Have you seen any specialists/other providers since your last visit with [place]?    Yes    Arm preference verified?   Yes    The patient is due for mammogram, eye exam, foot exam, shingles vaccine, pneumonia vaccine and Hep C Screen, Medicare Wellness, Advance Directive       Subjective:      Date: 05/16/2019 10:44 PM   Patient ID: Emily Rowe is a 68 y.o. female.    Chief Complaint:  Chief Complaint   Patient presents with    Hospital Follow-up       HPI  68 yr old female in for hospital fu   Patient has not seen me in 2 years  She was followed up in the [place] office    States she was not able to keep up with different physicians and everyone was changing medication  So she went to [place] in 11/2018  Stayed until May 11,2020  Was getting medical care there    Was seeing Dr Darin Engels- neuro  Was referred to psych    Endo - Dr Smith Robert  Did phone visit an dhas fu in Nov    In ER with uncontrolled BP and depression    Hospital Course:   Please see H&P for complete details of HPI and ROS. The patient was admitted to [facility] and has been taken care as mentioned below.    Ms. Emily Rowe is a  68 year old female with a known history of type 2 diabetes, hypertension, history of stroke 8 years ago affected her right side, presented to the hospital complaining of generalized weakness, headache and dizziness. The patient said that for the past couple days her blood sugar and her blood pressure had been going up and down, unable to get it under control, she is very anxious. She also mentioned that about her husband's affair with another women past 15 years and very stress about coping that.    1. Stroke like symptoms dizziness. Upon long discussion with patient, it appears that she has this dizziness intermittently for several years.No other focal neurology, speech is very fluent and normal. She is obviously anxious. Patient refused MRI brain to be done. She is able to ambulate.         She is on  aspirin and statin.   2. Type 2 diabetes: Uncontrolled, last hemoglobin A1c was 8.7, she is not adhering with her medication regimen at home, she follows up with her endocrinologistDr.Nagashree,which is according to her note had multiple medication intolerance in the past. She appear to be choosing on which medicine she is going to take and which medicine she is not.   3. Hypertension: With hypertensive urgency. currently she is on Diovan prn only if blood pressure spike up and Cozaar, both are ARBs. I recommend patient that she should not be on two ARBs but patient insist she will stay on it as per her Primary care recommend. The doses are not maximum doses.  Norvasc works well for her and she feels good. I will give prescription for Norvasc.   4. History of CVA: Stay on daily aspirin and statin  5. Significant anxiety: Improved with Ativan in the emergency room. She was on Xanax before but did not work for her anxiety. I will prescribe a few tablets of 0.5 mg Ativan and given her resources to see out patient Psychiatry. No suicidal or homicidal thought. Denies any physical abuse at home but stressed with her husband's long  affair with another women.         Was in ER recently  States felt headache, neck pain an bP was high 260's and so went to ER  She states she has been feeling anxiety with headache last few weeks  At end of day she Is getting anxious and nervous  States her daughter has moved in with a dog and she cannot do it  So has spoken with her daughter and she is moving out    PT was on celexa  Then she saw Dr Laurell Josephs and he gave her medication  No suicidal thoughts      Visit type: follow-up - type II DM  Type: Insulin requiring  Complications: peripheral vascular disease, diabetic peripheral neuropathy and hyperglycemia  Control: inadequate control  Comorbid illness: hyperlipidemia, hypertension, diabetes, obesity and OSA  Last follow-up: months ago and with the patient's primary care  physician    Diabetes Medication Regimen:  Medication effectiveness/adherence: working well and general adherence with medications  Medication side effects: constipation  Interval events: recent ER visit  Interval symptoms: fatigue  Patient denies: chest pain  Home glucose readings: Recall 121 average  Diabetic screening: last retinal exam was within 1 year and last podiatry exam was within 1 year  Podiatric assessment: admits LE burning/tingling and admits loss of LE sensation  Exercise: none  Diet: moderate compliance with, American Dietetic Association (ADA) diet and low sodium diet  Response to therapy: has been good       Patient has a chronic history of diabetes, hypertension, hyperlipidemia and strokes  Patient was [facility] during which time she was taking medications suffers high blood pressure from her country, [place].  Patient refused to change her medication stating that the previous prescriptions from here has caused her to have extreme side effects  Patient eventually saw cardiology and according to her they agreed to the medication she is taking.    Uncontrolled diabetes and patient was referred to endocrinologist  She is currently stable on insulin  She has significant peripheral neuropathy related to diabetes and given her previous stroke next sling she also has chronic back pain given lumbar spondylosis  Patient sees podiatrist and is up-to-date on exam  She is also up-to-date on ophthalmic eye exam    A1c trend:   Lab Results   Component Value Date    HGBA1C 7.4 (H) 05/11/2019    HGBA1C 8.7 (H) 10/11/2018    HGBA1C 8.1 (H) 05/21/2018     Trend is decreasing  Additional concerns: In addition, pt has hyperlipidemia and is compliant with lipid therapy.  Pt denies side effects of lipid therapy - specifically denies myalgia.      Problem List:  Patient Active Problem List   Diagnosis    Uncontrolled type 2 diabetes mellitus with hyperglycemia    Mixed hyperlipidemia     Benign hypertension    Long-term insulin use    Obesity (BMI 30-39.9)    Neuropathy    Stroke    Stroke-like symptom    Essential hypertension    Diabetic peripheral neuropathy    History of hemorrhagic stroke with residual hemiparesis    Type 2 diabetes mellitus treated with insulin       Current Medications:  Current Outpatient Medications   Medication Sig Dispense Refill    amLODIPine (NORVASC) 5 MG tablet Take 1 tablet (5 mg total) by mouth daily 90 tablet 0    aspirin 81 MG  chewable tablet Chew 1 tablet (81 mg total) by mouth daily 30 tablet 0    glipiZIDE (GLUCOTROL) 10 MG tablet Take 1 tablet (10 mg total) by mouth 2 (two) times daily before meals 180 tablet 1    insulin glargine (LANTUS SOLOSTAR) 100 UNIT/ML injection pen Inject 10 units into skin daily at bedtime (Patient taking differently: Inject 10 units into skin daily at bedtime STATES ONLY TAKES IF NEEDED  ) 15 mL 0    LORazepam (ATIVAN) 0.5 MG tablet Take 0.5 tablets (0.25 mg total) by mouth nightly as needed for Anxiety 5 tablet 0    losartan (COZAAR) 50 MG tablet Take 1 tablet (50 mg total) by mouth 2 (two) times daily 180 tablet 0    TRADJENTA 5 MG Tab TAKE 1 TABLET BY MOUTH EVERY DAY 90 tablet 1    pregabalin (LYRICA) 50 MG capsule TK 1 C PO QAM FOR 1 WEEK THEN TK 2 C PO BID       No current facility-administered medications for this visit.        Allergies:  Allergies   Allergen Reactions    Codeine Nausea And Vomiting    Crestor [Rosuvastatin] Rash     D/c due to severe rash and higher blood sugars    Glucosamine Hives and Nausea And Vomiting    Latex Rash    Eggs Or Egg-Derived Products      Patient had an allergy test and stated that she was allergic to eggs    Gabapentin      Dizziness and rash, irregular heart rate    Invokana [Canagliflozin]     Levemir [Insulin Detemir] Nausea And Vomiting    Metformin      diarrhea    Evaristo Bury [Insulin Degludec]     Victoza [Liraglutide] Nausea And Vomiting    Flaxseed  [Linseed Oil] Rash    Omega 3 [Fish Oil] Rash    Simvastatin Rash     Patients reports able to tolerate medication and is currently taking        Past Medical History:  Past Medical History:   Diagnosis Date    Anxiety     Depression     Diabetes     Hyperlipemia     Hypertensive disorder     Hypokalemia     Neuropathy     Stroke 2012    TIA (transient ischemic attack)        Past Surgical History:  Past Surgical History:   Procedure Laterality Date    HYSTERECTOMY      REDUCTION MAMMAPLASTY      TONSILLECTOMY      urinary bladder         Family History:  Family History   Problem Relation Age of Onset    Diabetes Mother     Diabetes Maternal Grandmother        Social History:  Social History     Socioeconomic History    Marital status: Married     Spouse name: Not on file    Number of children: Not on file    Years of education: Not on file    Highest education level: Not on file   Occupational History    Not on file   Social Needs    Financial resource strain: Not on file    Food insecurity     Worry: Not on file     Inability: Not on file    Transportation needs  Medical: Not on file     Non-medical: Not on file   Tobacco Use    Smoking status: Never Smoker    Smokeless tobacco: Never Used   Substance and Sexual Activity    Alcohol use: No    Drug use: No    Sexual activity: Not on file     Comment: married, not working ,   Lifestyle    Physical activity     Days per week: Not on file     Minutes per session: Not on file    Stress: Not on file   Relationships    Social connections     Talks on phone: Not on file     Gets together: Not on file     Attends religious service: Not on file     Active member of club or organization: Not on file     Attends meetings of clubs or organizations: Not on file     Relationship status: Not on file    Intimate partner violence     Fear of current or ex partner: Not on file     Emotionally abused: Not on file     Physically abused: Not on file      Forced sexual activity: Not on file   Other Topics Concern    Not on file   Social History Narrative    Not on file       The following sections were reviewed this encounter by the provider:   Tobacco   Allergies   Meds   Problems   Med Hx   Surg Hx   Fam Hx            Vitals:  BP 120/77 (BP Site: Left arm, Patient Position: Sitting, Cuff Size: Large)    Pulse 79    Temp 98.1 F (36.7 C) (Oral)    Resp 16    Wt 89.7 kg (197 lb 12.8 oz)    SpO2 95%    BMI 29.21 kg/m         ROS:  General/Constitutional:   Denies Chills. Denies Fatigue. Denies Fever.   Ophthalmologic:   Denies Blurred vision. Denies Eye Pain.   ENT:   Denies Nasal Discharge. Denies Ear pain. Denies Sinus pain.   Endocrine:   Denies Polydipsia. Denies Polyuria.   Respiratory:   Denies Cough. Denies Orthopnea. Denies Shortness of breath. Denies Wheezing.   Cardiovascular:   Denies Chest pain. Denies Chest pain with exertion. Denies Leg Claudication. Denies  Palpitations. Denies Swelling in hands/feet.   Gastrointestinal:   Denies Abdominal pain. Denies Blood in stool. Denies Constipation. Denies Diarrhea.  Denies Heartburn. Denies Nausea. Denies Vomiting.   Genitourinary:   Denies Blood in urine. Denies Frequent urination. Denies Painful urination.   Musculoskeletal:   + Leg cramps. + Muscle aches.   Skin:   Denies Skin lesion(s).   Neurologic:   Denies Dizziness. +Gait abnormality. Denies Headache. + Tingling/Numbness.     Physical Exam:  Alert and oriented. Affect is normal and appropriate. Clear thought process with normal reasoning and conversational tone and logic. No psychomotor retardation. Not in any acute distress  Skin: no rash or abnormal lesions  Eyes - normal eye movements.  Pupils equal and reactive to light  Eyelid normal on exam  Ears - TM clear, external ear normal  Nose - no acute changes  Throat - Oral mucosa moist and pink, no abnormality noted  Neck- No thyroid enlargement noted, no palpable  cervical lymph nodes  HEART: S1S2  heard, regular rate and rhythm  LUNGS: good air exchange, no crackles or ronchi or wheezing  ABDOMEN: Positive bowel sounds, no hepatospleenomegaly appreciated, no tenderness   Back: Normal curvature, no tenderness.   Extremities: FROM, no deformities, no edema, no erythema.   No knee effusion.   mild tendernesson the joint line.  Neuro: Physiological, speech better than before.   Right side weakness.   Restricted and only able to abduct <20%, Right leg weakness.   Strength RUE and RLE 3/5.         Skin: Normal, no rashes, no lesions noted.   Extremities: Warm, well perfused, no edema. Fingers stiff with some contracture L> R    Dyslipidemia:  Lab Results   Component Value Date    CHOL 251 (H) 10/11/2018    CHOL 234 (H) 05/21/2018    TRIG 114 10/11/2018    TRIG 184 (H) 05/21/2018    HDL 46 10/11/2018    HDL 44 05/21/2018    LDL 182 (H) 10/11/2018    LDL 153 (H) 05/21/2018       Lab Results   Component Value Date    HGBA1C 7.4 (H) 05/11/2019    HGBA1C 8.7 (H) 10/11/2018    HGBA1C 8.1 (H) 05/21/2018       Assessment/Plan:       1. Type 2 diabetes mellitus treated with insulin  Chronic  Uncontrolled  A1c at 8.1  Patient has been advised to set up an appointment to follow-up with endocrinologist  Importance of getting yearly eye exam discussed  2. Essential hypertension  - amLODIPine (NORVASC) 5 MG tablet; Take 1 tablet (5 mg total) by mouth daily  Dispense: 90 tablet; Refill: 0  - losartan (COZAAR) 50 MG tablet; Take 1 tablet (50 mg total) by mouth 2 (two) times daily  Dispense: 180 tablet; Refill: 0  Uncontrolled per records  Patient is doing continues to do some medication adjustment on her own  Patient has been advised to continue Norvasc 5 mg daily and losartan 50 mg twice a day-but she states that she only takes it once a day in the morning and she uses the evening dose only if her blood pressure appears high for her !  Also patient states that resolved prior physician had given her losartan go use the appropriate  pressure was under control  I discouraged the patient from using medication adjustment by herself  She should not be using 2 angiotensin receptor medication and has been advised to stop using the valsartan.  Monitor blood pressure at home  Patient does admit that her anxiety and stress makes her blood pressure go up  She needs to be treated for anxiety then  She used to be previously on Celexa for her anxiety but seems like she discontinued  Patient has not seen a psychologist or therapist  Importance of that has been discussed  3. Diabetic peripheral neuropathy  Patient has a history of peripheral neuropathy  According to her she is seen the neurologist who is trying to start her on Lyrica  Lyrica side effects including dizziness has been discussed  Will be a high risk medication fall following her situation  Patient will discuss with her neurologist  4. Mixed hyperlipidemia  - Comprehensive metabolic panel; Future  - Lipid panel; Future  #5. history of stroke  Importance of controlling her blood pressure, taking anticoagulants, currently she is only on aspirin  Preventing further strokes has been discussed  Return in about 10 days (around 05/26/2019) for always 30 minutes- HTN,stroke fu.        This note was generated by the Epic EMR system/ Dragon speech recognition and may contain inherent errors or omissions not intended by the user. Grammatical errors, random word insertions, deletions, pronoun errors and incomplete sentences are occasional consequences of this technology due to software limitations. Not all errors are caught or corrected. If there are questions or concerns about the content of this note or information contained within the body of this dictation they should be addressed directly with the author for clarification      Layani Foronda Bernadene Person, MD

## 2019-05-23 ENCOUNTER — Other Ambulatory Visit: Payer: Medicare Other

## 2019-05-23 ENCOUNTER — Other Ambulatory Visit (FREE_STANDING_LABORATORY_FACILITY): Payer: Medicare Other

## 2019-05-23 DIAGNOSIS — E1165 Type 2 diabetes mellitus with hyperglycemia: Secondary | ICD-10-CM

## 2019-05-23 DIAGNOSIS — E782 Mixed hyperlipidemia: Secondary | ICD-10-CM

## 2019-05-23 LAB — COMPREHENSIVE METABOLIC PANEL
ALT: 17 U/L (ref 0–55)
AST (SGOT): 20 U/L (ref 5–34)
Albumin/Globulin Ratio: 1.2 (ref 0.9–2.2)
Albumin: 3.9 g/dL (ref 3.5–5.0)
Alkaline Phosphatase: 85 U/L (ref 37–106)
Anion Gap: 10 (ref 5.0–15.0)
BUN: 10 mg/dL (ref 7.0–19.0)
Bilirubin, Total: 0.4 mg/dL (ref 0.2–1.2)
CO2: 28 mEq/L (ref 21–29)
Calcium: 8.9 mg/dL (ref 8.5–10.5)
Chloride: 102 mEq/L (ref 100–111)
Creatinine: 0.8 mg/dL (ref 0.4–1.5)
Globulin: 3.2 g/dL (ref 2.0–3.7)
Glucose: 110 mg/dL — ABNORMAL HIGH (ref 70–100)
Potassium: 3.8 mEq/L (ref 3.5–5.1)
Protein, Total: 7.1 g/dL (ref 6.0–8.3)
Sodium: 140 mEq/L (ref 136–145)

## 2019-05-23 LAB — LIPID PANEL
Cholesterol / HDL Ratio: 5.5
Cholesterol: 226 mg/dL — ABNORMAL HIGH (ref 0–199)
HDL: 41 mg/dL (ref 40–9999)
LDL Calculated: 160 mg/dL — ABNORMAL HIGH (ref 0–99)
Triglycerides: 125 mg/dL (ref 34–149)
VLDL Calculated: 25 mg/dL (ref 10–40)

## 2019-05-23 LAB — MICROALBUMIN, RANDOM URINE
Urine Creatinine, Random: 48.7 mg/dL
Urine Microalbumin, Random: <5 (ref 0.0–30.0)

## 2019-05-23 LAB — GFR: EGFR: >60

## 2019-05-23 LAB — HEMOLYSIS INDEX: Hemolysis Index: 6 (ref 0–18)

## 2019-05-23 LAB — TSH: TSH: 1.46 u[IU]/mL (ref 0.35–4.94)

## 2019-05-23 NOTE — Progress Notes (Signed)
Lab results are in the normal/ expected range other than the following:  1. Total cholesterol and LDL/ bad cholesterol levels are high. Recommend compliance with diet low in saturated fat. Decrease the intake of whole fat dairy, red meat, fried foods etc. Recommend resumption of simvastatin.

## 2019-05-28 ENCOUNTER — Ambulatory Visit (INDEPENDENT_AMBULATORY_CARE_PROVIDER_SITE_OTHER): Payer: Medicare Other | Admitting: Family Medicine

## 2019-05-28 ENCOUNTER — Encounter (INDEPENDENT_AMBULATORY_CARE_PROVIDER_SITE_OTHER): Payer: Self-pay | Admitting: Family Medicine

## 2019-05-28 VITALS — BP 130/78 | HR 80 | Temp 98.2°F | Resp 16 | Wt 198.6 lb

## 2019-05-28 DIAGNOSIS — E1165 Type 2 diabetes mellitus with hyperglycemia: Secondary | ICD-10-CM

## 2019-05-28 DIAGNOSIS — I69359 Hemiplegia and hemiparesis following cerebral infarction affecting unspecified side: Secondary | ICD-10-CM

## 2019-05-28 DIAGNOSIS — G629 Polyneuropathy, unspecified: Secondary | ICD-10-CM

## 2019-05-28 DIAGNOSIS — I639 Cerebral infarction, unspecified: Secondary | ICD-10-CM

## 2019-05-28 DIAGNOSIS — E782 Mixed hyperlipidemia: Secondary | ICD-10-CM

## 2019-05-28 DIAGNOSIS — F419 Anxiety disorder, unspecified: Secondary | ICD-10-CM

## 2019-05-28 DIAGNOSIS — I10 Essential (primary) hypertension: Secondary | ICD-10-CM

## 2019-05-28 MED ORDER — LORAZEPAM 0.5 MG PO TABS
0.2500 mg | ORAL_TABLET | Freq: Every evening | ORAL | 0 refills | Status: DC | PRN
Start: 2019-05-28 — End: 2019-09-05

## 2019-05-28 MED ORDER — DULOXETINE HCL 20 MG PO CPEP
20.00 mg | ORAL_CAPSULE | Freq: Every day | ORAL | 1 refills | Status: DC
Start: 2019-05-28 — End: 2019-06-27

## 2019-05-28 NOTE — Progress Notes (Signed)
Have you seen any specialists/other providers since your last visit with [place]?    No    Arm preference verified?   Yes    The patient is due for mammogram, eye exam, foot exam, shingles vaccine, pneumonia vaccine and Hep C Screen, Medicare Wellness, Advance Directive       Subjective:      Date: 05/28/2019 10:00 AM   Patient ID: Emily Rowe is a 68 y.o. female.    Chief Complaint:  Chief Complaint   Patient presents with    Hypertension       HPI  68 yr old female in for HTN fu  Since last visit she has been taking amlodipine 5 mg in PM, losartan 50 mg bid   BP stable at home, but slightly high right before her BP med dose but after the med her BP is stable  No headache  + dizziness all the time    C/o feeling weak    Hyperlipidemia- 10 mg simvastatin daily  Pt has a hyperlipidemia and is compliant with anti-lipidemic therapy.  Pt denies side effects of anti-lipidemic therapy - specifically denies myalgia.  LDL is at goal as per 2018-19 lab values.      States anxiety and depression is worse  When neuropathy gets worse she gets anxious and this makes it worse  At hospital got Ativan and been using it 1/2 dose prn and requesting refills  PT was on celexa  Then she saw Dr Laurell Josephs and he gave her medication  No suicidal thoughts    States she was not able to keep up with different physicians and everyone was changing medication  So she went to [place] in 11/2018  Stayed until May 11,2020  Was getting medical care there    Was seeing Dr Darin Engels- neuro  Was referred to psych    Endo - Dr Smith Robert  Patient does have an appointment for diabetes follow-up next month      Problem List:  Patient Active Problem List   Diagnosis    Uncontrolled type 2 diabetes mellitus with hyperglycemia    Mixed hyperlipidemia    Long-term insulin use    Obesity (BMI 30-39.9)    Neuropathy    Stroke    Stroke-like symptom    Essential hypertension    Diabetic peripheral neuropathy    History of hemorrhagic stroke  with residual hemiparesis    Type 2 diabetes mellitus treated with insulin       Current Medications:  Current Outpatient Medications   Medication Sig Dispense Refill    amLODIPine (NORVASC) 5 MG tablet Take 1 tablet (5 mg total) by mouth daily 90 tablet 0    aspirin 81 MG chewable tablet Chew 1 tablet (81 mg total) by mouth daily 30 tablet 0    glipiZIDE (GLUCOTROL) 10 MG tablet Take 1 tablet (10 mg total) by mouth 2 (two) times daily before meals 180 tablet 1    insulin glargine (LANTUS SOLOSTAR) 100 UNIT/ML injection pen Inject 10 units into skin daily at bedtime (Patient taking differently: Inject 10 units into skin daily at bedtime STATES ONLY TAKES IF NEEDED  ) 15 mL 0    LORazepam (ATIVAN) 0.5 MG tablet Take 0.5 tablets (0.25 mg total) by mouth nightly as needed for Anxiety 10 tablet 0    losartan (COZAAR) 50 MG tablet Take 1 tablet (50 mg total) by mouth 2 (two) times daily 180 tablet 0    pregabalin (LYRICA) 50  MG capsule TK 1 C PO QAM FOR 1 WEEK THEN TK 2 C PO BID      simvastatin (ZOCOR) 10 MG tablet Take 10 mg by mouth nightly      TRADJENTA 5 MG Tab TAKE 1 TABLET BY MOUTH EVERY DAY 90 tablet 1    DULoxetine (CYMBALTA) 20 MG capsule Take 1 capsule (20 mg total) by mouth daily 30 capsule 1     No current facility-administered medications for this visit.        Allergies:  Allergies   Allergen Reactions    Codeine Nausea And Vomiting    Crestor [Rosuvastatin] Rash     D/c due to severe rash and higher blood sugars    Glucosamine Hives and Nausea And Vomiting    Latex Rash    Eggs Or Egg-Derived Products      Patient had an allergy test and stated that she was allergic to eggs    Gabapentin      Dizziness and rash, irregular heart rate    Invokana [Canagliflozin]     Levemir [Insulin Detemir] Nausea And Vomiting    Metformin      diarrhea    Evaristo Bury [Insulin Degludec]     Victoza [Liraglutide] Nausea And Vomiting    Flaxseed [Linseed Oil] Rash    Omega 3 [Fish Oil] Rash     Simvastatin Rash     Patients reports able to tolerate medication and is currently taking        Past Medical History:  Past Medical History:   Diagnosis Date    Anxiety     Depression     Diabetes     Hyperlipemia     Hypertensive disorder     Hypokalemia     Neuropathy     Stroke 2012    TIA (transient ischemic attack)        Past Surgical History:  Past Surgical History:   Procedure Laterality Date    HYSTERECTOMY      REDUCTION MAMMAPLASTY      TONSILLECTOMY      urinary bladder         Family History:  Family History   Problem Relation Age of Onset    Diabetes Mother     Diabetes Maternal Grandmother        Social History:  Social History     Socioeconomic History    Marital status: Married     Spouse name: Not on file    Number of children: Not on file    Years of education: Not on file    Highest education level: Not on file   Occupational History    Not on file   Social Needs    Financial resource strain: Not on file    Food insecurity     Worry: Not on file     Inability: Not on file    Transportation needs     Medical: Not on file     Non-medical: Not on file   Tobacco Use    Smoking status: Never Smoker    Smokeless tobacco: Never Used   Substance and Sexual Activity    Alcohol use: No    Drug use: No    Sexual activity: Not on file     Comment: married, not working ,   Lifestyle    Physical activity     Days per week: Not on file     Minutes per session: Not on file  Stress: Not on file   Relationships    Social connections     Talks on phone: Not on file     Gets together: Not on file     Attends religious service: Not on file     Active member of club or organization: Not on file     Attends meetings of clubs or organizations: Not on file     Relationship status: Not on file    Intimate partner violence     Fear of current or ex partner: Not on file     Emotionally abused: Not on file     Physically abused: Not on file     Forced sexual activity: Not on file   Other  Topics Concern    Not on file   Social History Narrative    Not on file       The following sections were reviewed this encounter by the provider:   Tobacco   Allergies   Meds   Problems   Med Hx   Surg Hx   Fam Hx            Vitals:  BP 130/78 (BP Site: Left arm, Patient Position: Sitting, Cuff Size: Large)    Pulse 80    Temp 98.2 F (36.8 C) (Oral)    Resp 16    Wt 90.1 kg (198 lb 9.6 oz)    SpO2 97%    BMI 29.33 kg/m         ROS:  General/Constitutional:   Denies Chills. Denies Fatigue. Denies Fever.   Ophthalmologic:   Denies Blurred vision. Denies Eye Pain.   ENT:   Denies Nasal Discharge. Denies Ear pain. Denies Sinus pain.   Endocrine:   Denies Polydipsia. Denies Polyuria.   Respiratory:   Denies Cough. Denies Orthopnea. Denies Shortness of breath. Denies Wheezing.   Cardiovascular:   Denies Chest pain. Denies Chest pain with exertion. Denies Leg Claudication. Denies  Palpitations. Denies Swelling in hands/feet.   Gastrointestinal:   Denies Abdominal pain. Denies Blood in stool. Denies Constipation. Denies Diarrhea.  Denies Heartburn. Denies Nausea. Denies Vomiting.   Genitourinary:   Denies Blood in urine. Denies Frequent urination. Denies Painful urination.   Musculoskeletal:   + Leg cramps. + Muscle aches.   Skin:   Denies Skin lesion(s).   Neurologic:   Denies Dizziness. +Gait abnormality. Denies Headache. + Tingling/Numbness.     Physical Exam:  Alert and oriented. Affect is normal and appropriate. Clear thought process with normal reasoning and conversational tone and logic. No psychomotor retardation. Not in any acute distress  Skin: no rash or abnormal lesions  Eyes - normal eye movements.  Pupils equal and reactive to light  Eyelid normal on exam  Ears - TM clear, external ear normal  Nose - no acute changes  Throat - Oral mucosa moist and pink, no abnormality noted  Neck- No thyroid enlargement noted, no palpable cervical lymph nodes  HEART: S1S2 heard, regular rate and rhythm  LUNGS: good air  exchange, no crackles or ronchi or wheezing  ABDOMEN: Positive bowel sounds, no hepatospleenomegaly appreciated, no tenderness   Back: Normal curvature, no tenderness.   Extremities: FROM, no deformities, no edema, no erythema.   No knee effusion.   mild tendernesson the joint line.  Neuro: Physiological, speech better than before.   Right side weakness.   Restricted and only able to abduct <20%, Right leg weakness.   Strength RUE and RLE 3/5.  Skin: Normal, no rashes, no lesions noted.   Extremities: Warm, well perfused, no edema. Fingers stiff with some contracture L> R    Dyslipidemia:  Lab Results   Component Value Date    CHOL 226 (H) 05/23/2019    CHOL 251 (H) 10/11/2018    TRIG 125 05/23/2019    TRIG 114 10/11/2018    HDL 41 05/23/2019    HDL 46 10/11/2018    LDL 160 (H) 05/23/2019    LDL 182 (H) 10/11/2018       Lab Results   Component Value Date    HGBA1C 7.4 (H) 05/11/2019    HGBA1C 8.7 (H) 10/11/2018    HGBA1C 8.1 (H) 05/21/2018       Assessment/Plan:       1. Essential hypertension  Chronic  Patient is pleased at this time has been noncompliant with taking her medication as recommended  Continue losartan 50 twice daily and amlodipine 5 mg once a day  Low-salt diet  Monitor blood pressure at home  2. Mixed hyperlipidemia  Patient currently on simvastatin 10 mg once a day  States that she has occasional itch from the simvastatin  Previously failed use of Lipitor and Crestor because of severe side effects  E LDL currently uncontrolled  Recheck in 3 months  If unable to tolerate simvastatin at higher doses will try a different statin  Should follow low-carb, low-fat diet and exercise  3. History of hemorrhagic stroke with residual hemiparesis  Currently followed by neurology  Will benefit from physical therapy  4. Uncontrolled type 2 diabetes mellitus with hyperglycemia  Patient is on insulin  Followed by endocrinology  5. Anxiety  - LORazepam (ATIVAN) 0.5 MG tablet; Take 0.5 tablets (0.25 mg total)  by mouth nightly as needed for Anxiety  Dispense: 10 tablet; Refill: 0  - DULoxetine (CYMBALTA) 20 MG capsule; Take 1 capsule (20 mg total) by mouth daily  Dispense: 30 capsule; Refill: 1  Patient requesting a refill on Ativan  Side effects of the benzodiazepines discussed in detail  Informed I will not do a continuous refill on this medication and she has been recommended to see a psychiatrist  Patient then agreed to take something on a steady basis instead of having to go see a psychiatrist  We talked about medications available  She did try Celexa with minimal effect but she was not taking the medication regularly  Can try Cymbalta given that she has neuropathy  Still medication side effects has been discussed  6. Neuropathy  - DULoxetine (CYMBALTA) 20 MG capsule; Take 1 capsule (20 mg total) by mouth daily  Dispense: 30 capsule; Refill: 1    7. Cerebrovascular accident (CVA), unspecified mechanism  - DULoxetine (CYMBALTA) 20 MG capsule; Take 1 capsule (20 mg total) by mouth daily  Dispense: 30 capsule; Refill: 1      Cannot do ativan regularly discussed  Advised see OP Psychiatrist - pt defers now stating seen once 1 yr back and was told to go back to her neuro    Return in about 2 weeks (around 06/11/2019) for Medicare AWV.    This note was generated by the Epic EMR system/ Dragon speech recognition and may contain inherent errors or omissions not intended by the user. Grammatical errors, random word insertions, deletions, pronoun errors and incomplete sentences are occasional consequences of this technology due to software limitations. Not all errors are caught or corrected. If there are questions or concerns about the content of this note  or information contained within the body of this dictation they should be addressed directly with the author for clarification      Starlet Gallentine Sidonie Dickens, MD

## 2019-05-31 ENCOUNTER — Telehealth (INDEPENDENT_AMBULATORY_CARE_PROVIDER_SITE_OTHER): Payer: Self-pay

## 2019-05-31 NOTE — Telephone Encounter (Signed)
-----   Message from Tanya Nones, MD sent at 05/23/2019 10:40 PM EDT -----  Lab results are in the normal/ expected range other than the following:  1. Total cholesterol and LDL/ bad cholesterol levels are high. Recommend compliance with diet low in saturated fat. Decrease the intake of whole fat dairy, red meat, fried foods etc. Recommend resumption of simvastatin.

## 2019-05-31 NOTE — Telephone Encounter (Signed)
I will call her next week.   I have reached out the patient back in  4/20 and 7/20.

## 2019-05-31 NOTE — Telephone Encounter (Signed)
-  Informed patient of lab results/ provider recommendations.   -Patient states that in last week she has resumed simvastatin 10 mg daily.   - Patient feels that her diet is somewhat controlled. She states that she does not eat red meat, fried foods, or a lot of dairy. She is requesting a nutrition referral.

## 2019-05-31 NOTE — Telephone Encounter (Signed)
Please provide information to connect with nurse navigator Waneta Martins. Thanks

## 2019-06-03 ENCOUNTER — Encounter (INDEPENDENT_AMBULATORY_CARE_PROVIDER_SITE_OTHER): Payer: Self-pay

## 2019-06-03 ENCOUNTER — Other Ambulatory Visit (INDEPENDENT_AMBULATORY_CARE_PROVIDER_SITE_OTHER): Payer: Self-pay

## 2019-06-03 NOTE — Progress Notes (Signed)
Nurse Navigator placed a call to patient per Dr. Maeola Harman referral. There was no answer. LVMM introducing Nurse Navigator and explaining education role. Contact information left for questions or concerns or scheduling a meeting to review diabetes education. NN sent patient a My Chart message as well.     Waneta Martins, MSN, [employer]  T [phone]

## 2019-06-10 ENCOUNTER — Other Ambulatory Visit (INDEPENDENT_AMBULATORY_CARE_PROVIDER_SITE_OTHER): Payer: Self-pay

## 2019-06-10 NOTE — Progress Notes (Signed)
NN called the patient about nutrition information for lowering cholesterol.  The patient answered and reported she has gastrointestinal issues related to certain foods-dairy, nuts, whole grains and egg yolks.  NN recommended she follow up PCP and determine whether she may need an allergy evaluation.  In the meantime, NN will send patient a diabetes education packet which includes information to help improve cholesterol levels.  Address confirmed.  Patient had no further concerns.  NN will contact the patient in 2 weeks to review education in detail.     Emily Martins, MSN, RN-BC  [facility]urse Emergency planning[facility]management [facility]  [address], [address]  [place], [place] [address]  T [phone]

## 2019-06-20 ENCOUNTER — Telehealth (INDEPENDENT_AMBULATORY_CARE_PROVIDER_SITE_OTHER): Payer: Self-pay | Admitting: Family Medicine

## 2019-06-20 ENCOUNTER — Ambulatory Visit (INDEPENDENT_AMBULATORY_CARE_PROVIDER_SITE_OTHER): Payer: Medicare Other | Admitting: Family Medicine

## 2019-06-20 NOTE — Telephone Encounter (Signed)
lvm for pt to rescheduled missed MAW visit today

## 2019-06-27 ENCOUNTER — Telehealth: Payer: Self-pay | Admitting: Family Medicine

## 2019-06-27 ENCOUNTER — Telehealth (INDEPENDENT_AMBULATORY_CARE_PROVIDER_SITE_OTHER): Payer: Medicare Other | Admitting: Family Medicine

## 2019-06-27 ENCOUNTER — Encounter (INDEPENDENT_AMBULATORY_CARE_PROVIDER_SITE_OTHER): Payer: Self-pay | Admitting: Family Medicine

## 2019-06-27 VITALS — Temp 99.2°F

## 2019-06-27 DIAGNOSIS — R6889 Other general symptoms and signs: Secondary | ICD-10-CM

## 2019-06-27 DIAGNOSIS — Z20828 Contact with and (suspected) exposure to other viral communicable diseases: Secondary | ICD-10-CM

## 2019-06-27 DIAGNOSIS — M791 Myalgia, unspecified site: Secondary | ICD-10-CM

## 2019-06-27 DIAGNOSIS — Z20822 Contact with and (suspected) exposure to covid-19: Secondary | ICD-10-CM

## 2019-06-27 DIAGNOSIS — R509 Fever, unspecified: Secondary | ICD-10-CM

## 2019-06-27 NOTE — Telephone Encounter (Signed)
Pt calls stating that she has been taking care of her husband and took him to the hospital and pt states she is a bit concerned about contracting COVID. Pt states she has had a slight fever and body aches, but denies SOB and other symptoms.     Pt would like PCP to call her back for advise.     Pt can best be reached at [phone]. Please advise, thank you.

## 2019-06-27 NOTE — Telephone Encounter (Signed)
Call transferred to the FD to schedule an appointment.

## 2019-06-27 NOTE — Progress Notes (Signed)
Subjective:      Date: 06/27/2019 3:05 PM   Patient ID: Emily Rowe is a 68 y.o. female.    This visit is being conducted via telephone.   Verbal consent has been obtained from the patient to conduct a telephone visit encounter to minimize exposure to COVID-19: yes     Chief Complaint:  Chief Complaint   Patient presents with    covid exposure       HPI:    68 yr old female doing phone visit  Exposed to COVID  Husband tested + for covid    Husband is home with her  Pt sick with low grade fever,bodyache  No cough  No fever  + Bodyache  + weak  + headache  No diarrhea  No loss of smell or taste    States stopped cymbalta due to side effects    Problem List:  Patient Active Problem List   Diagnosis    Uncontrolled type 2 diabetes mellitus with hyperglycemia    Mixed hyperlipidemia    Long-term insulin use    Obesity (BMI 30-39.9)    Neuropathy    Stroke    Stroke-like symptom    Essential hypertension    Diabetic peripheral neuropathy    History of hemorrhagic stroke with residual hemiparesis    Type 2 diabetes mellitus treated with insulin       Current Medications:  No outpatient medications have been marked as taking for the 06/27/19 encounter (Telemedicine Visit) with Sabas Sous, MD.       Allergies:  Allergies   Allergen Reactions    Codeine Nausea And Vomiting    Crestor [Rosuvastatin] Rash     D/c due to severe rash and higher blood sugars    Glucosamine Hives and Nausea And Vomiting    Latex Rash    Eggs Or Egg-Derived Products      Patient had an allergy test and stated that she was allergic to eggs    Gabapentin      Dizziness and rash, irregular heart rate    Invokana [Canagliflozin]     Levemir [Insulin Detemir] Nausea And Vomiting    Metformin      diarrhea    Evaristo Bury [Insulin Degludec]     Victoza [Liraglutide] Nausea And Vomiting    Flaxseed [Linseed Oil] Rash    Omega 3 [Fish Oil] Rash    Simvastatin Rash     Patients reports able to  tolerate medication and is currently taking        Past Medical History:  Past Medical History:   Diagnosis Date    Anxiety     Depression     Diabetes     Hyperlipemia     Hypertensive disorder     Hypokalemia     Neuropathy     Stroke 2012    TIA (transient ischemic attack)        Past Surgical History:  Past Surgical History:   Procedure Laterality Date    HYSTERECTOMY      REDUCTION MAMMAPLASTY      TONSILLECTOMY      urinary bladder         Family History:  Family History   Problem Relation Age of Onset    Diabetes Mother     Diabetes Maternal Grandmother        Social History:  Social History     Tobacco Use    Smoking status: Never Smoker  Smokeless tobacco: Never Used   Substance Use Topics    Alcohol use: No    Drug use: No         The following sections were reviewed this encounter by the provider:   Tobacco   Allergies   Meds   Problems   Med Hx   Surg Hx   Fam Hx            ROS:  Review of Systems   Constitutional: Positive for fatigue and fever. Negative for activity change, appetite change and chills.   HENT: Negative for congestion and sinus pain.    Eyes: Negative for redness.   Respiratory: Negative for chest tightness, shortness of breath and wheezing.    Cardiovascular: Negative for chest pain and palpitations.   Gastrointestinal: Negative for diarrhea.   Neurological: Negative for dizziness.          Objective:   Vitals:  Temp 99.2 F (37.3 C)     Physical Exam   Constitutional: She is oriented to person, place, and time and well-developed, well-nourished, and in no distress. No distress.   Neurological: She is alert and oriented to person, place, and time.   Skin: She is not diaphoretic.   Psychiatric: Mood and affect normal.   no resp distress noted    Assessment:/plan       1. Close Exposure to Covid-19 Virus  - COVID-19 (SARS-CoV-2) [facility]    2. Suspected Covid-19 Virus Infection  - COVID-19 (SARS-CoV-2) [facility]    3. Myalgia  - COVID-19 (SARS-CoV-2)  [facility]    4. Low grade fever  - COVID-19 (SARS-CoV-2) [facility]    Discussed with patient to quarantine,wear mask  Infection prevention with good personal hygiene practices   -hand washing 20 seconds with soap and water  -disinfecting work and food preparation surfaces  -minimizing touching your face and avoiding close proximity with anyone who is ill and exhibiting symptoms.   - Avoid shaking hands or hugging  - Avoid crowded public space or meeting and exposure    It is important to monitor symptoms like chest pain, shortness of breath advised call 911 and seek immediate medical help.        Time spent in discussion: 11 to 20 minutes     Kholton Coate Bernadene Person, MD

## 2019-06-27 NOTE — Telephone Encounter (Signed)
Spoke with pt. Did Telephone visit today/

## 2019-06-28 ENCOUNTER — Encounter (INDEPENDENT_AMBULATORY_CARE_PROVIDER_SITE_OTHER): Payer: Self-pay | Admitting: Family Medicine

## 2019-06-28 DIAGNOSIS — U071 COVID-19: Secondary | ICD-10-CM | POA: Insufficient documentation

## 2019-06-28 LAB — COVID-19 (SARS-COV-2): SARS CoV 2 Overall Result: DETECTED — AB

## 2019-07-01 ENCOUNTER — Emergency Department
Admission: EM | Admit: 2019-07-01 | Discharge: 2019-07-01 | Disposition: A | Discharge status: Home / Self Care | Payer: Medicare Other | Attending: Emergency Medicine | Admitting: Emergency Medicine

## 2019-07-01 ENCOUNTER — Emergency Department: Payer: Medicare Other

## 2019-07-01 DIAGNOSIS — R1013 Epigastric pain: Secondary | ICD-10-CM

## 2019-07-01 DIAGNOSIS — U071 COVID-19: Secondary | ICD-10-CM

## 2019-07-01 DIAGNOSIS — Z8673 Personal history of transient ischemic attack (TIA), and cerebral infarction without residual deficits: Secondary | ICD-10-CM | POA: Insufficient documentation

## 2019-07-01 DIAGNOSIS — K869 Disease of pancreas, unspecified: Secondary | ICD-10-CM

## 2019-07-01 DIAGNOSIS — E119 Type 2 diabetes mellitus without complications: Secondary | ICD-10-CM | POA: Insufficient documentation

## 2019-07-01 DIAGNOSIS — E785 Hyperlipidemia, unspecified: Secondary | ICD-10-CM | POA: Insufficient documentation

## 2019-07-01 DIAGNOSIS — Z8619 Personal history of other infectious and parasitic diseases: Secondary | ICD-10-CM | POA: Insufficient documentation

## 2019-07-01 DIAGNOSIS — I10 Essential (primary) hypertension: Secondary | ICD-10-CM | POA: Insufficient documentation

## 2019-07-01 DIAGNOSIS — Z794 Long term (current) use of insulin: Secondary | ICD-10-CM | POA: Insufficient documentation

## 2019-07-01 DIAGNOSIS — Z7982 Long term (current) use of aspirin: Secondary | ICD-10-CM | POA: Insufficient documentation

## 2019-07-01 LAB — CBC AND DIFFERENTIAL
Absolute NRBC: 0 10*3/uL (ref 0.00–0.00)
Basophils Absolute Automated: 0.02 10*3/uL (ref 0.00–0.08)
Basophils Automated: 0.3 %
Eosinophils Absolute Automated: 0 10*3/uL (ref 0.00–0.44)
Eosinophils Automated: 0 %
Hematocrit: 40 % (ref 34.7–43.7)
Hgb: 12.6 g/dL (ref 11.4–14.8)
Immature Granulocytes Absolute: 0.01 10*3/uL (ref 0.00–0.07)
Immature Granulocytes: 0.2 %
Lymphocytes Absolute Automated: 1.17 10*3/uL (ref 0.42–3.22)
Lymphocytes Automated: 19.9 %
MCH: 28.2 pg (ref 25.1–33.5)
MCHC: 31.5 g/dL (ref 31.5–35.8)
MCV: 89.5 fL (ref 78.0–96.0)
MPV: 12.9 fL — ABNORMAL HIGH (ref 8.9–12.5)
Monocytes Absolute Automated: 0.46 10*3/uL (ref 0.21–0.85)
Monocytes: 7.8 %
Neutrophils Absolute: 4.22 10*3/uL (ref 1.10–6.33)
Neutrophils: 71.8 %
Nucleated RBC: 0 /100 WBC (ref 0.0–0.0)
Platelets: 134 10*3/uL — ABNORMAL LOW (ref 142–346)
RBC: 4.47 10*6/uL (ref 3.90–5.10)
RDW: 14 % (ref 11–15)
WBC: 5.88 10*3/uL (ref 3.10–9.50)

## 2019-07-01 LAB — ECG 12-LEAD
Atrial Rate: 88 {beats}/min
P Axis: 49 degrees
P-R Interval: 144 ms
Q-T Interval: 386 ms
QRS Duration: 78 ms
QTC Calculation (Bezet): 467 ms
R Axis: 65 degrees
T Axis: 37 degrees
Ventricular Rate: 88 {beats}/min

## 2019-07-01 LAB — COMPREHENSIVE METABOLIC PANEL
ALT: 19 U/L (ref 0–55)
AST (SGOT): 21 U/L (ref 5–34)
Albumin/Globulin Ratio: 1.2 (ref 0.9–2.2)
Albumin: 3.7 g/dL (ref 3.5–5.0)
Alkaline Phosphatase: 74 U/L (ref 37–106)
Anion Gap: 8 (ref 5.0–15.0)
BUN: 10.1 mg/dL (ref 7.0–19.0)
Bilirubin, Total: 0.4 mg/dL (ref 0.2–1.2)
CO2: 28 mEq/L (ref 22–29)
Calcium: 8.5 mg/dL (ref 8.5–10.5)
Chloride: 102 mEq/L (ref 100–111)
Creatinine: 0.8 mg/dL (ref 0.6–1.0)
Globulin: 3 g/dL (ref 2.0–3.6)
Glucose: 132 mg/dL — ABNORMAL HIGH (ref 70–100)
Potassium: 3.7 mEq/L (ref 3.5–5.1)
Protein, Total: 6.7 g/dL (ref 6.0–8.3)
Sodium: 138 mEq/L (ref 136–145)

## 2019-07-01 LAB — URINALYSIS REFLEX TO MICROSCOPIC EXAM - REFLEX TO CULTURE
Bilirubin, UA: NEGATIVE
Blood, UA: NEGATIVE
Glucose, UA: NEGATIVE
Ketones UA: NEGATIVE
Leukocyte Esterase, UA: NEGATIVE
Nitrite, UA: NEGATIVE
Protein, UR: NEGATIVE
Specific Gravity UA: 1.015 (ref 1.001–1.035)
Urine pH: 7 (ref 5.0–8.0)
Urobilinogen, UA: NORMAL mg/dL (ref 0.2–2.0)

## 2019-07-01 LAB — MAGNESIUM: Magnesium: 1.7 mg/dL (ref 1.6–2.6)

## 2019-07-01 LAB — LIPASE: Lipase: 10 U/L (ref 8–78)

## 2019-07-01 LAB — TROPONIN I: Troponin I: <0.01 ng/mL (ref 0.00–0.05)

## 2019-07-01 LAB — LACTIC ACID, PLASMA: Lactic Acid: 0.9 mmol/L (ref 0.2–2.0)

## 2019-07-01 LAB — GFR: EGFR: >60

## 2019-07-01 MED ORDER — SODIUM CHLORIDE 0.9 % IV BOLUS
1000.00 mL | Freq: Once | INTRAVENOUS | Status: AC
Start: 2019-07-01 — End: 2019-07-01
  Administered 2019-07-01: 1000 mL via INTRAVENOUS

## 2019-07-01 MED ORDER — ACETAMINOPHEN 500 MG PO TABS
1000.0000 mg | ORAL_TABLET | Freq: Once | ORAL | Status: AC
Start: 2019-07-01 — End: 2019-07-01
  Administered 2019-07-01: 1000 mg via ORAL
  Filled 2019-07-01: qty 2

## 2019-07-01 MED ORDER — ALUM & MAG HYDROXIDE-SIMETH 200-200-20 MG/5ML PO SUSP
15.00 mL | Freq: Once | ORAL | Status: AC
Start: 2019-07-01 — End: 2019-07-01
  Administered 2019-07-01: 15 mL via ORAL
  Filled 2019-07-01: qty 30

## 2019-07-01 MED ORDER — ONDANSETRON HCL 4 MG/2ML IJ SOLN
4.00 mg | Freq: Once | INTRAMUSCULAR | Status: AC
Start: 2019-07-01 — End: 2019-07-01
  Administered 2019-07-01: 4 mg via INTRAVENOUS
  Filled 2019-07-01: qty 2

## 2019-07-01 MED ORDER — LIDOCAINE VISCOUS HCL 2 % MT SOLN
10.00 mL | Freq: Once | OROMUCOSAL | Status: AC
Start: 2019-07-01 — End: 2019-07-01
  Administered 2019-07-01: 10 mL via OROMUCOSAL
  Filled 2019-07-01: qty 15

## 2019-07-01 MED ORDER — IOHEXOL 350 MG/ML IV SOLN
100.00 mL | Freq: Once | INTRAVENOUS | Status: AC | PRN
Start: 2019-07-01 — End: 2019-07-01
  Administered 2019-07-01: 100 mL via INTRAVENOUS

## 2019-07-01 MED ORDER — FAMOTIDINE 20 MG PO TABS
20.00 mg | ORAL_TABLET | Freq: Two times a day (BID) | ORAL | 0 refills | Status: AC
Start: 2019-07-01 — End: 2019-07-06

## 2019-07-01 MED ORDER — MORPHINE SULFATE 2 MG/ML IJ/IV SOLN (WRAP)
2.0000 mg | Freq: Once | Status: DC
Start: 2019-07-01 — End: 2019-07-01
  Filled 2019-07-01: qty 1

## 2019-07-01 NOTE — ED Provider Notes (Signed)
History     Chief Complaint   Patient presents with    Headache    Abdominal Pain     68 year old diabetic hypertensive cholesterol stroke patient presents to the emergency department COVID positive diagnosed September 8 with a 2 to 3-day history since her covered diagnosis severe epigastric abdominal pain and headache subjective fevers and chills.  She came in with her husband who is "sicker than her.  In for further evaluation patient denies any other chest pain coughing shortness of breath.  Has not taken any recent Advil.  Last aspirin or NSAID was greater than 12 hours ago no dysuria frequency urgency no nausea no vomiting epigastric pain is a 10 out of 10 nonradiating burning discomfort.        The history is provided by the patient.        Past Medical History:   Diagnosis Date    Anxiety     Depression     Diabetes     Hyperlipemia     Hypertensive disorder     Hypokalemia     Neuropathy     Stroke 2012    TIA (transient ischemic attack)        Past Surgical History:   Procedure Laterality Date    HYSTERECTOMY      REDUCTION MAMMAPLASTY      TONSILLECTOMY      urinary bladder         Family History   Problem Relation Age of Onset    Diabetes Mother     Diabetes Maternal Grandmother        Social  Social History     Tobacco Use    Smoking status: Never Smoker    Smokeless tobacco: Never Used   Substance Use Topics    Alcohol use: No    Drug use: No       .     Allergies   Allergen Reactions    Codeine Nausea And Vomiting    Crestor [Rosuvastatin] Rash     D/c due to severe rash and higher blood sugars    Glucosamine Hives and Nausea And Vomiting    Latex Rash    Eggs Or Egg-Derived Products      Patient had an allergy test and stated that she was allergic to eggs    Gabapentin      Dizziness and rash, irregular heart rate    Invokana [Canagliflozin]     Levemir [Insulin Detemir] Nausea And Vomiting    Metformin      diarrhea    Evaristo Bury [Insulin Degludec]     Victoza  [Liraglutide] Nausea And Vomiting    Flaxseed [Linseed Oil] Rash    Omega 3 [Fish Oil] Rash    Simvastatin Rash     Patients reports able to tolerate medication and is currently taking        Home Medications     Med List Status:  In Progress Set By: Crecencio Mc, RN at 07/01/2019  4:30 AM                aspirin 81 MG chewable tablet     Chew 162 mg by mouth daily     glipiZIDE (GLUCOTROL) 10 MG tablet     Take 1 tablet (10 mg total) by mouth 2 (two) times daily before meals     insulin glargine (LANTUS SOLOSTAR) 100 UNIT/ML injection pen     Inject 10 units into skin daily at bedtime  Patient taking differently:  Inject 10 units into skin daily at bedtime STATES ONLY TAKES IF NEEDED       LORazepam (ATIVAN) 0.5 MG tablet     Take 0.5 tablets (0.25 mg total) by mouth nightly as needed for Anxiety     losartan (COZAAR) 50 MG tablet     Take 1 tablet (50 mg total) by mouth 2 (two) times daily     pregabalin (LYRICA) 50 MG capsule     TK 1 C PO QAM FOR 1 WEEK THEN TK 2 C PO BID     simvastatin (ZOCOR) 10 MG tablet     Take 10 mg by mouth nightly     TRADJENTA 5 MG Tab     TAKE 1 TABLET BY MOUTH EVERY DAY           Review of Systems   Constitutional: Positive for chills, fatigue and fever.   HENT: Negative for congestion and sore throat.    Eyes: Negative for visual disturbance.   Respiratory: Negative for cough and shortness of breath.    Cardiovascular: Positive for chest pain. Negative for palpitations.   Gastrointestinal: Positive for abdominal pain. Negative for diarrhea and vomiting.   Genitourinary: Negative for dysuria.   Musculoskeletal: Negative for back pain and neck pain.   Skin: Negative for rash.   Neurological: Positive for weakness, light-headedness and headaches.        Patient has from her stroke right sided upper and lower extremity weakness   Hematological: Does not bruise/bleed easily.   Psychiatric/Behavioral: The patient is nervous/anxious.        Physical Exam    BP: 147/73, Heart Rate:  85, Resp Rate: 15, SpO2: 99 %, Weight: 89.9 kg    Physical Exam  Vitals signs and nursing note reviewed.   Constitutional:       Appearance: She is well-developed.   HENT:      Head: Normocephalic.      Mouth/Throat:      Mouth: Mucous membranes are dry.      Pharynx: No oropharyngeal exudate.   Eyes:      General: No scleral icterus.     Extraocular Movements: Extraocular movements intact.      Pupils: Pupils are equal, round, and reactive to light.   Neck:      Musculoskeletal: Normal range of motion and neck supple.   Cardiovascular:      Rate and Rhythm: Normal rate and regular rhythm.      Pulses: Normal pulses.      Heart sounds: Normal heart sounds. No murmur.   Pulmonary:      Effort: Pulmonary effort is normal. No respiratory distress.      Breath sounds: Normal breath sounds.   Chest:      Chest wall: No tenderness.   Abdominal:      General: Bowel sounds are normal.      Palpations: Abdomen is soft.      Tenderness: There is abdominal tenderness in the epigastric area. There is guarding. There is no right CVA tenderness or left CVA tenderness. Negative signs include Murphy's sign, Rovsing's sign and psoas sign.       Musculoskeletal: Normal range of motion.         General: No swelling or tenderness.      Right lower leg: No edema.      Left lower leg: No edema.   Lymphadenopathy:      Cervical: No cervical adenopathy.  Skin:     General: Skin is warm and dry.      Capillary Refill: Capillary refill takes less than 2 seconds.      Coloration: Skin is not jaundiced.   Neurological:      General: No focal deficit present.      Mental Status: She is alert and oriented to person, place, and time.      GCS: GCS eye subscore is 4. GCS verbal subscore is 5. GCS motor subscore is 6.      Cranial Nerves: Cranial nerves are intact.      Sensory: Sensation is intact.      Deep Tendon Reflexes: Reflexes are normal and symmetric.      Comments: Right upper extremity 4 out of 5 left upper extremity 5 out of 5 this is  not unusual for patient.  Lower extremities are 5 out of 5 lower bilaterally   Psychiatric:         Mood and Affect: Mood is anxious.         Behavior: Behavior normal.           MDM and ED Course     ED Medication Orders (From admission, onward)    Start Ordered     Status Ordering Provider    07/01/19 0444 07/01/19 0443  acetaminophen (TYLENOL) tablet 1,000 mg  Once     Route: Oral  Ordered Dose: 1,000 mg     Last MAR action:  Given Delvonte Berenson         Labs Reviewed   CBC AND DIFFERENTIAL - Abnormal; Notable for the following components:       Result Value    Platelets 134 (*)     MPV 12.9 (*)     All other components within normal limits    Narrative:     Replace urinary catheter prior to obtaining the urine culture  if it has been in place for greater than or equal to 14  days:->N/A No Foley  Indications for U/A Reflex to Micro - Reflex to  Culture:->Other (please specify in Comments)   COMPREHENSIVE METABOLIC PANEL - Abnormal; Notable for the following components:    Glucose 132 (*)     All other components within normal limits    Narrative:     Replace urinary catheter prior to obtaining the urine culture  if it has been in place for greater than or equal to 14  days:->N/A No Foley  Indications for U/A Reflex to Micro - Reflex to  Culture:->Other (please specify in Comments)   URINALYSIS REFLEX TO MICROSCOPIC EXAM - REFLEX TO CULTURE - Abnormal; Notable for the following components:    Clarity, UA Sl Cloudy (*)     All other components within normal limits    Narrative:     Replace urinary catheter prior to obtaining the urine culture  if it has been in place for greater than or equal to 14  days:->N/A No Foley  Indications for U/A Reflex to Micro - Reflex to  Culture:->Other (please specify in Comments)   CULTURE BLOOD AEROBIC AND ANAEROBIC    Narrative:     1 BLUE+1 PURPLE   CULTURE BLOOD AEROBIC AND ANAEROBIC    Narrative:     1 BLUE+1 PURPLE   LIPASE    Narrative:     Replace urinary catheter prior  to obtaining the urine culture  if it has been in place for greater than or equal to 14  days:->N/A  No Foley  Indications for U/A Reflex to Micro - Reflex to  Culture:->Other (please specify in Comments)   GFR    Narrative:     Replace urinary catheter prior to obtaining the urine culture  if it has been in place for greater than or equal to 14  days:->N/A No Foley  Indications for U/A Reflex to Micro - Reflex to  Culture:->Other (please specify in Comments)   TROPONIN I   MAGNESIUM   LACTIC ACID, PLASMA    Narrative:     Cancel second order for specimen if the initial level is less  than 2.62mEq/L   LACTIC ACID, PLASMA     CT Abd/Pelvis with IV Contrast only   Final Result       1.  No acute abnormality identified within the abdomen or pelvis to   account for reported symptoms.   2.  Bibasilar airspace disease, suspicious for infection in the acute   setting.   3.  Cystic lesions of the pancreatic tail. On the basis of lesion size   and patient age, recommend follow-up imaging at one year, preferably   with pancreatic protocol CT or abdominal MRI without and with contrast.      Mosetta Putt, MD    07/01/2019 8:48 AM      XR Chest  AP Portable   Final Result      No acute abnormality.      Johnsie Kindred, MD    07/01/2019 5:39 AM              MDM  Number of Diagnoses or Management Options  COVID-19 virus detected:   Epigastric pain:   Diagnosis management comments: Dr. Cathlean Marseilles  is the primary attending for this patient and has obtained and performed the history, PE, and medical decision making for this patient.    Oxygen saturation by pulse oximetry is 95%-100%, Normal.  Interventions: None Needed and Patient Observed.    An EKG NSR going a rate of 88 with no acute ST elevations or T wave changes.  Flat T waves in 3 and aVF.  No acute ST elevations noted.  No change from 05/10/2019 no QTC prolongations reviewed and interpreted by me    When I was within 6 feet of this patient I donned the following PPE:  Surgical Mask  Yes, Gloves Yes, Gown Yes; Goggles Yes; Face Shield Yes, 17M 6000 Respirator Yes; N95 No  .  The patient was wearing a mask during my evaluation Yes.      68 year old with multiple medical problems COVID positive comes in here with a 2 to 3-day history of continuous headache and epigastric pain no different.    IV labs observation medications.    Patient observed looks well not hypoxic vital signs are stable.  Chest x-ray showing no pneumonia.  Troponin negative.  White blood cell chemistries are within normal limits.    I think signs and symptoms are alcohol related.  Patient is stable for outpatient evaluation and follow-up.  30 a.m. patient still complaining of severe epigastric pain based on laboratory I do not concern for any significant pathology however because of her risk factors could be gastroparesis gastritis GERD obstruction.  We will CT scan with IV contrast.  If negative I think patient can go home.    Dr. Cassandria Santee  to follow-up on the CT scan       Amount and/or Complexity of Data Reviewed  Clinical lab tests: ordered and reviewed  Tests in the radiology section of CPT: ordered and reviewed    Negative white count chemistries are within normal limits.  Chest x-ray showing gross pneumonia troponin negative               07/02/19 : inbasket message to pcp regarding abnormal ct, and will attempt to call pt to notify.   Procedures    Clinical Impression & Disposition     Clinical Impression  Final diagnoses:   COVID-19 virus detected   Epigastric pain   Pancreatic lesion        ED Disposition     ED Disposition Condition Date/Time Comment    Discharge  Mon Jul 01, 2019  8:53 AM Joeseph Amor discharge to home/self care.    Condition at disposition: Stable           New Prescriptions    No medications on file                 Leonia Reeves, MD  07/02/19 2053

## 2019-07-01 NOTE — ED Triage Notes (Signed)
pt arrived with complaints of headache that stated three days ago.Pt called for husband but EMS wanted her to be seen after she states abd pain for 2days, fever 100.4, and headache.

## 2019-07-01 NOTE — Discharge Instructions (Signed)
Abdominal Pain    You have been diagnosed with abdominal (belly) pain. The cause of your pain is not yet known.    Many things can cause abdominal pain such as infections and bowel (intestine) spasms. You might need another examination or more tests to find out why you have pain.    At this time, your pain does not seem to be caused by anything dangerous. You do not need surgery. You do not need to stay in the hospital.     Though we dont believe your condition is dangerous right now, it is important to be careful. Sometimes a problem that seems mild now can become serious later. If you do not get completely better or your symptoms get worse, you should seek more care. This is why it is important that you get additional help unless you are 100% improved   In 2 to 3 days, return here, go to the nearest Emergency Department or follow up with your regular doctor.    For the next 24 hours, Drink only clear liquids such as:   Water.   Clear broth.   Sports drinks.   Clear caffeine-free soft drinks, like 7-Up or Sprite.    Return here or go to the nearest Emergency Department immediately if:   Your pain does not go away or gets worse.   You cannot keep fluids down    Your vomit (throw up) is dark green.    You vomit (throw up) blood or see blood in your stool (poop). Blood might be bright red or dark red. It can also be black and look like tar.   You have a fever (temperature higher than 100.73F / 38C) or shaking chills.   Your skin or eyes look yellow.   Your urine looks brown.   You have severe diarrhea.    If you can't follow up with your doctor, or if at any time you feel you need to be rechecked or seen again, come back here or go to the nearest emergency department.             Know how it spreads  There is currently no vaccine to prevent coronavirus disease 2019 (COVID-19).   The best way to prevent illness is to avoid being exposed to this virus.   The virus is thought to spread from  person-to-person.    Wash your hands often   Wash your hands often with soap and water for at least 20 seconds especially after you have been in a public place, or after blowing your nose, coughing, or sneezing.   If soap and water are not available, use a hand sanitizer that contains at least 60% alcohol.    Avoid touching your eyes, nose, and mouth with unwashed hands.    Physical distancing and cloth face coverings   Put distance between yourself and other people outside of your home.  Stay at least 6 feet (about 2 arms length) from other people.   Cover your face and mouth with a cloth face cover or mask when around others or out in public.  o Cloth face coverings should not be placed on young children under age 57, anyone who has trouble breathing, or is unconscious, incapacitated or otherwise unable to remove the mask without assistance.  o The cloth face cover is meant to protect other people in case you are infected.   o Continue to maintain 6 feet distance from others when possible. A face covering is not  a substitute for social distancing.     Clean and disinfect   Clean AND disinfect frequently touched surfaces daily. This includes tables, doorknobs, light switches, countertops, handles, desks, phones, keyboards, toilets, faucets, and sinks.    What to do if you become sick  If you are sick with COVID-19 or think you might have COVID-19, follow these steps to care for yourself and to help protect other people in your home and your community:   Look for emergency warning signs* of COVID-19:   o Trouble breathing  o Persistent pain or pressure in the chest  o New confusion  o Inability to wake or stay awake  o Bluish lips or face  o Call 911 or call ahead to your local emergency facility: Notify the operator that you are seeking care for someone who has or may have COVID-19.  o  *This is not a full list. Please call your doctor for any other symptoms that or concern you.   Stay home.  Do not leave  your home, except to get medical care. If you have a medical appointment, call ahead to your doctors office, and tell them you have or may have COVID-19. This will help the office protect themselves and other patients. Avoid public transportation, ride-sharing, or taxis.     As much as possible, stay in a specific room and away from other people and pets in your home. If possible, you should use a separate bathroom.    If a caregiver/other person needs to clean and disinfect a sick persons bedroom or bathroom, they should wear a mask and disposable gloves prior to cleaning. They should wait as long as possible after the person who is sick has used the bathroom before coming in to clean and use the bathroom. Hands should be washed after gloves are removed.   Do not share dishes, drinking glasses, cups, eating utensils, towels, or bedding with other people in your home.  Wash items thoroughly after using them with soap and water or put in the dishwasher.    https://www.hensley.biz/.html  DiscoHelp.si.html    Test Results and Questions: If youre having difficulty finding your COVID-19 test results through MyChart, or if you have additional questions, please contact the COVID-19 Test Results Call Center at [phone] for further instructions.    If you had a positive COVID-19 test here is a link to take you to a fact sheet.  https://ruiz.com/ for patients with cough or trouble breathing:    Please try to not spend a lot of time lying flat on your back! Laying on your stomach and in different positions will help your body to get air into all areas of your lung.    Your health care team recommends trying to change your position every 30 minutes to 2 hours and even sitting up is better than laying on your back.    If you are able to, please try this:    1. 30 minutes - 2  hours: lying on your belly    2. 30 minutes - 2 hours: lying on your right side    3. 30 minutes - 2 hours: sitting up    4. 30 minutes - 2 hours: lying on your left side; then back to position #1

## 2019-07-02 ENCOUNTER — Ambulatory Visit (INDEPENDENT_AMBULATORY_CARE_PROVIDER_SITE_OTHER): Payer: Medicare Other | Admitting: Family Medicine

## 2019-07-03 ENCOUNTER — Telehealth: Payer: Self-pay

## 2019-07-03 ENCOUNTER — Telehealth (INDEPENDENT_AMBULATORY_CARE_PROVIDER_SITE_OTHER): Payer: Self-pay | Admitting: Family Medicine

## 2019-07-03 NOTE — Telephone Encounter (Signed)
Pt called stating she has just left the hospital and was told to call her PCP and get orders for a MRI for her pancreas. 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 Pt also would like to know when she can get the MRI done since she is positive for Covid-19

## 2019-07-03 NOTE — Telephone Encounter (Addendum)
Spoke with patient, states still having fevers/chills. States did not feel well overnight. Discussed CT results of pancreatic lesion, she states she is aware of this (not a new finding for patient) and to discuss further with PCP to see if needs MRI. States had appt with PCP yesterday but appt was cancelled. Informed to call PCP back to reschedule another f/u appt but stressed to return to ED immediately if s/s worsen. Pt states understanding. Faxed CT to PCP.           ----- Message from Leonia Reeves, MD sent at 07/02/2019  8:48 PM EDT -----  Pt f/u:     Recommend to contact pt : how is pt feeling ? Has pt had f/u ? If worsening ssx return to ED.   Covid positive, with multiple medical problems. \       Radiology:recommend f/u with mri of pancreatic lesion and need to make sure not malignancy,     K. Cherukuri, MD  [facility]  [facility]  [phone]

## 2019-07-03 NOTE — Telephone Encounter (Signed)
Please schedule pt for ER fu visit and to discuss abnormal Ct scan report. Possibly 30 minutes- ok to schedule for next week.

## 2019-07-03 NOTE — Telephone Encounter (Signed)
Dr. Delman Cheadle,    Pls see message. LM for pt to call back and schedule a tele visit    Thanks

## 2019-07-04 NOTE — Telephone Encounter (Signed)
Pt scheduled telephone visit for 07/05/2019

## 2019-07-04 NOTE — Telephone Encounter (Signed)
Pls call and schedule a follow up.    Thanks

## 2019-07-05 ENCOUNTER — Encounter (INDEPENDENT_AMBULATORY_CARE_PROVIDER_SITE_OTHER): Payer: Self-pay | Admitting: Family Medicine

## 2019-07-05 ENCOUNTER — Telehealth (INDEPENDENT_AMBULATORY_CARE_PROVIDER_SITE_OTHER): Payer: Medicare Other | Admitting: Family Medicine

## 2019-07-05 DIAGNOSIS — I69359 Hemiplegia and hemiparesis following cerebral infarction affecting unspecified side: Secondary | ICD-10-CM

## 2019-07-05 DIAGNOSIS — Z8673 Personal history of transient ischemic attack (TIA), and cerebral infarction without residual deficits: Secondary | ICD-10-CM

## 2019-07-05 DIAGNOSIS — U071 COVID-19: Secondary | ICD-10-CM

## 2019-07-05 DIAGNOSIS — K862 Cyst of pancreas: Secondary | ICD-10-CM

## 2019-07-05 DIAGNOSIS — I639 Cerebral infarction, unspecified: Secondary | ICD-10-CM

## 2019-07-05 DIAGNOSIS — Z794 Long term (current) use of insulin: Secondary | ICD-10-CM

## 2019-07-05 NOTE — Progress Notes (Signed)
Subjective:      Date: 07/05/2019 3:28 PM   Patient ID: Emily Rowe is a 68 y.o. female.    This visit is being conducted via telephone.   Verbal consent has been obtained from the patient to conduct a telephone visit encounter to minimize exposure to COVID-19: yes     Chief Complaint:  Chief Complaint   Patient presents with    ER fu    Results       HPI:  HPI  68 year old female doing a telephone visit to discuss her recent visit to the ER and test result  Patient was tested positive for COVID-19 on September 10  She thinks she may have been positive even before then because her husband has been positive and sick and she has been caring for him.  States that she has felt very sick for the last 3 days with fever, nausea and abdominal pain  Today she feels much better and does not have any fever or abdominal pain  Appetite is reduced but she is trying to eat better  Patient was in the emergency room recently because of the abdominal pain  She had CT scan done and she is here to discuss the results    CT abdomen and pelvis:  IMPRESSION:   1.  No acute abnormality identified within the abdomen or pelvis to  account for reported symptoms.  2.  Bibasilar airspace disease, suspicious for infection in the acute  setting.  3.  Cystic lesions of the pancreatic tail. On the basis of lesion size  and patient age, recommend follow-up imaging at one year, preferably  with pancreatic protocol CT or abdominal MRI without and with contrast.    Mosetta Putt, MD   07/01/2019 8:48 AM    Patient has been seen gastroenterologist Dr. Telford Nab  She was scheduled to have a colonoscopy but has postponed because of the positive Cologuard test  Problem List:  Patient Active Problem List   Diagnosis    Uncontrolled type 2 diabetes mellitus with hyperglycemia    Mixed hyperlipidemia    Long-term insulin use    Obesity (BMI 30-39.9)    Neuropathy    Stroke    Essential hypertension    Diabetic peripheral  neuropathy    History of hemorrhagic stroke with residual hemiparesis    Type 2 diabetes mellitus treated with insulin    COVID-19 virus infection       Current Medications:  No outpatient medications have been marked as taking for the 07/05/19 encounter (Telemedicine Visit) with Sabas Sous, MD.       Allergies:  Allergies   Allergen Reactions    Codeine Nausea And Vomiting    Crestor [Rosuvastatin] Rash     D/c due to severe rash and higher blood sugars    Glucosamine Hives and Nausea And Vomiting    Latex Rash    Eggs Or Egg-Derived Products      Patient had an allergy test and stated that she was allergic to eggs    Gabapentin      Dizziness and rash, irregular heart rate    Invokana [Canagliflozin]     Levemir [Insulin Detemir] Nausea And Vomiting    Metformin      diarrhea    Evaristo Bury [Insulin Degludec]     Victoza [Liraglutide] Nausea And Vomiting    Flaxseed [Linseed Oil] Rash    Omega 3 [Fish Oil] Rash    Simvastatin Rash  Patients reports able to tolerate medication and is currently taking        Past Medical History:  Past Medical History:   Diagnosis Date    Anxiety     Depression     Diabetes     Hyperlipemia     Hypertensive disorder     Hypokalemia     Neuropathy     Stroke 2012    TIA (transient ischemic attack)        Past Surgical History:  Past Surgical History:   Procedure Laterality Date    HYSTERECTOMY      REDUCTION MAMMAPLASTY      TONSILLECTOMY      urinary bladder         Family History:  Family History   Problem Relation Age of Onset    Diabetes Mother     Diabetes Maternal Grandmother        Social History:  Social History     Tobacco Use    Smoking status: Never Smoker    Smokeless tobacco: Never Used   Substance Use Topics    Alcohol use: No    Drug use: No         The following sections were reviewed this encounter by the provider:   Tobacco   Allergies   Meds   Problems   Med Hx   Surg Hx   Fam Hx            ROS:  Review of  Systems   General/Constitutional:   Denies Chills. +Fatigue. Denies Fever. + Reduced appetite  Ophthalmologic:   Denies Blurred vision.   ENT:   Denies Nasal Discharge. Denies Sinus pain. Denies Sore throat.   Respiratory:   Denies Cough. Denies Shortness of breath. Denies Wheezing.   Cardiovascular:   Denies Chest pain. Denies Chest pain with exertion. Denies Palpitations. Denies  Swelling in hands/feet.   Gastrointestinal:   Denies Abdominal pain. Denies Constipation. Denies Diarrhea. Denies Nausea. Denies  Vomiting.   Skin:   Denies Rash.   Neurologic:   Denies Dizziness. Denies Headache. Denies Tingling/Numbness.     Objective:   Vitals:  There were no vitals taken for this visit.      Physical Exam:  General Examination:   Physical Exam   GENERAL APPEARANCE: alert, in no acute distress, appearing at baseline   she does not appear to be short of breath during conversation  Lab Results   Component Value Date    WBC 5.88 07/01/2019    HGB 12.6 07/01/2019    HCT 40.0 07/01/2019    PLT 134 (L) 07/01/2019    CHOL 226 (H) 05/23/2019    TRIG 125 05/23/2019    HDL 41 05/23/2019    LDL 160 (H) 05/23/2019    ALT 19 07/01/2019    AST 21 07/01/2019    NA 138 07/01/2019    K 3.7 07/01/2019    CL 102 07/01/2019    CREAT 0.8 07/01/2019    BUN 10.1 07/01/2019    CO2 28 07/01/2019    TSH 1.46 05/23/2019    INR 1.0 05/10/2019    GLU 132 (H) 07/01/2019    HGBA1C 7.4 (H) 05/11/2019       Assessment:       1. Cyst of pancreas  - MRI Abdomen W WO Contrast; Future  - Gastroenterology Referral: Telford Nab, MD [facility])  CT results discussed in detail with patient  Ordered MRI with and without contrast to  check on the pancreatic cyst  Patient will follow-up with the results with Dr. Telford Nab her gastroenterologist  She has been advised to call us back when she gets the MRI done for follow-up  2. COVID-19 virus infection    Patient with recent COVID-19 infection  Improving  Advised to monitor her symptoms  closely  Continue to wear mask, proper handwashing, social distancing      3. Cerebrovascular accident (CVA), unspecified mechanism    4. History of hemorrhagic stroke with residual hemiparesis    5. Long-term insulin use    Time spent in discussion: 11 to 20 minutes   Plan:         Follow-up:   No follow-ups on file.       Presley Summerlin Bernadene Person, MD

## 2019-07-06 NOTE — Telephone Encounter (Signed)
Done. Pt did telephone visit-Ct result discussed. See visit note

## 2019-07-16 ENCOUNTER — Other Ambulatory Visit (INDEPENDENT_AMBULATORY_CARE_PROVIDER_SITE_OTHER): Payer: Self-pay | Admitting: Family Medicine

## 2019-07-16 ENCOUNTER — Ambulatory Visit
Admission: RE | Admit: 2019-07-16 | Discharge: 2019-07-16 | Disposition: A | Discharge status: Home / Self Care | Payer: Medicare Other | Source: Ambulatory Visit | Attending: Family Medicine | Admitting: Family Medicine

## 2019-07-16 DIAGNOSIS — N281 Cyst of kidney, acquired: Secondary | ICD-10-CM | POA: Insufficient documentation

## 2019-07-16 DIAGNOSIS — K862 Cyst of pancreas: Secondary | ICD-10-CM | POA: Insufficient documentation

## 2019-07-16 MED ORDER — GADOBUTROL 1 MMOL/ML IV SOLN
0.10 mmol/kg | Freq: Once | INTRAVENOUS | Status: AC | PRN
Start: 2019-07-16 — End: 2019-07-16
  Administered 2019-07-16: 10 mmol via INTRAVENOUS
  Filled 2019-07-16: qty 10

## 2019-07-17 ENCOUNTER — Telehealth (INDEPENDENT_AMBULATORY_CARE_PROVIDER_SITE_OTHER): Payer: Self-pay | Admitting: Internal Medicine

## 2019-07-17 NOTE — Telephone Encounter (Signed)
Pt is scheduled for appt on 07/22/2019. Stated that she tested positive for covid-19 on 06/25/2019. She stated that she was fever free before actually being testing positive and has not experienced any symptoms since 1.5 weeks ago. She was informed that provider needs to be consulted since her preference is to be seen in office. She stated that she had trouble with video visit last time and can use zoom but if this is no longer allowed, then she will do phone consult. Pt states her sugars have been stable lately. Please advise.

## 2019-07-19 NOTE — Telephone Encounter (Signed)
We can see the patient in the office if she is asymptomatic for the last 14 days. If there are mild concerning symptoms  recommend proceeding with phone consultation. Thanks

## 2019-07-22 ENCOUNTER — Ambulatory Visit (INDEPENDENT_AMBULATORY_CARE_PROVIDER_SITE_OTHER): Payer: Medicare Other | Admitting: Internal Medicine

## 2019-07-22 ENCOUNTER — Encounter (INDEPENDENT_AMBULATORY_CARE_PROVIDER_SITE_OTHER): Payer: Self-pay | Admitting: Internal Medicine

## 2019-07-22 VITALS — BP 149/85 | HR 82 | Temp 96.7°F | Resp 16 | Ht 63.0 in | Wt 198.0 lb

## 2019-07-22 DIAGNOSIS — E1165 Type 2 diabetes mellitus with hyperglycemia: Secondary | ICD-10-CM

## 2019-07-22 DIAGNOSIS — Z6834 Body mass index (BMI) 34.0-34.9, adult: Secondary | ICD-10-CM

## 2019-07-22 DIAGNOSIS — E782 Mixed hyperlipidemia: Secondary | ICD-10-CM

## 2019-07-22 DIAGNOSIS — I10 Essential (primary) hypertension: Secondary | ICD-10-CM

## 2019-07-22 DIAGNOSIS — E669 Obesity, unspecified: Secondary | ICD-10-CM

## 2019-07-22 MED ORDER — GLIPIZIDE 10 MG PO TABS
10.0000 mg | ORAL_TABLET | Freq: Every morning | ORAL | 1 refills | Status: DC
Start: 2019-07-22 — End: 2019-08-19

## 2019-07-22 MED ORDER — TRADJENTA 5 MG PO TABS
1.0000 | ORAL_TABLET | Freq: Every day | ORAL | 1 refills | Status: DC
Start: 2019-07-22 — End: 2019-11-21

## 2019-07-22 NOTE — Progress Notes (Signed)
Subjective:      Date: 07/22/2019 4:07 PM   Patient ID: Emily Rowe is a 68 y.o. female.    Chief Complaint:  Chief Complaint   Patient presents with    Uncontrolled type 2 diabetes mellitus with hyperglycemia     Verbal consent has been obtained from the patient to conduct a video and telephone visit to minimize exposure to COVID-19: YES    HPI  Visit type: follow-up - type II DM   Diagnosed: over 5 years ago  Type: Insulin requiring  Complications: hyperglycemia  Control: inadequate control  Comorbid illness: hyperlipidemia  Last follow-up: 6 months ago    Diabetes Medication Regimen: Glipizide XL 10 mg daily  , Tradjenta 5 mg daily , Lantus 10 units daily     Unable to tolerate Metformin due to diarrhea, unable to tolerate Victoza due to n/v, unable to tolerate Invokana. Taking potassium pill daily along with eating sweet potato and avocado everyday. Unable to tolerate Levemir due to episodes of nausea and vomiting. Not able to tolerate tresiba.  Medication effectiveness/adherence: working well and stopped taking Levemir   Medication side effects: none  Interval events: no interval events   Interval symptoms: none  Patient denies: no other symptoms including chest pain, shortness of breath, hypoglycemic episodes, polydipsia, polyuria, headaches, dizziness, or abdominal pain  Home glucose readings:  FBG 99-100, 2H PPBG 134-140 mg/dl  Daily basal insulin dosing: Levemir (stopped taking 1 month ago)   Daily bolus insulin dosing: non-insulin requiring  Diabetic screening: yesterday, January 02, 2018, no known retinopathy  Podiatric assessment: admits LE burning/tingling due to hx of stroke   Exercise: walks occasionally   Diet: moderate compliance with recommended diet; eats more vegetables and protein   Response to therapy: with hemoglobin A1C above goal  A1c trend: improving     Lab Results   Component Value Date    HGBA1C 7.4 (H) 05/11/2019    HGBA1C 8.7 (H) 10/11/2018    HGBA1C 8.1 (H) 05/21/2018      Trend is decreasing  Additional concerns: In addition, pt has hyperlipidemia and is compliant with lipid therapy.  Pt denies side effects of lipid therapy - specifically denies myalgia., In addition, pt has stable essential HTN with no evidence of CHF or proteinuria.  The patient is compliant with anti-hypertensive therapy and denies side effects to therapy.  Pt denies CP, SOB, dizziness, orthopnea, PND or edema.     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     H/o severe hypokalemia which required ER visit; had been off lisinopril at that time.  Notes allergic reaction soon after taking crestor.      Interval history: Elements of HPI were reviewed and updated. Notes worsening symptoms of anxiety, which results in episodes of hyperglycemia. Notes c/o abdominal bloating. Has been unable to tolerate long term use of levemir which results in GI side effects. Notes consistent compliance with listed medications for hypertension and hyperlipidemia.    Diet recall: BF: Waffle, apple, Lunch: Spinach, fries, Dinner: Fruit, vegetables    Reports ongoing c/o weakness and poor appetite following recent infection with COVID19 in 9/20. Required hospitalization in 7/20 and 8/20. Highest BG reading was 150 mg/dl. BG readings range 92-128 mg/dl. FBG was 112 mg/dl. BP has been trending higher. Not taking diabetic medications at this time due to trend towards hypoglycemia. Checks BG x2/ day in morning and at night.      Problem  List:  Patient Active Problem List   Diagnosis    Uncontrolled type 2 diabetes mellitus with hyperglycemia    Mixed hyperlipidemia    Long-term insulin use    Obesity (BMI 30-39.9)    Neuropathy    Stroke    Essential hypertension    Diabetic peripheral neuropathy    History of hemorrhagic stroke with residual hemiparesis    Type 2 diabetes mellitus treated with insulin    COVID-19 virus infection       Current Medications:  Current Outpatient  Medications   Medication Sig Dispense Refill    aspirin 81 MG chewable tablet Chew 162 mg by mouth daily      insulin glargine (LANTUS SOLOSTAR) 100 UNIT/ML injection pen Inject 10 units into skin daily at bedtime (Patient taking differently: Inject 10 units into skin daily at bedtime STATES ONLY TAKES IF NEEDED  ) 15 mL 0    LORazepam (ATIVAN) 0.5 MG tablet Take 0.5 tablets (0.25 mg total) by mouth nightly as needed for Anxiety 10 tablet 0    losartan (COZAAR) 50 MG tablet Take 1 tablet (50 mg total) by mouth 2 (two) times daily 180 tablet 0    simvastatin (ZOCOR) 10 MG tablet Take 10 mg by mouth every other day         glipiZIDE (GLUCOTROL) 10 MG tablet Take 1 tablet (10 mg total) by mouth 2 (two) times daily before meals 180 tablet 1    pregabalin (LYRICA) 50 MG capsule TK 1 C PO QAM FOR 1 WEEK THEN TK 2 C PO BID      TRADJENTA 5 MG Tab TAKE 1 TABLET BY MOUTH EVERY DAY 90 tablet 1     No current facility-administered medications for this visit.        Allergies:  Allergies   Allergen Reactions    Codeine Nausea And Vomiting    Crestor [Rosuvastatin] Rash     D/c due to severe rash and higher blood sugars    Glucosamine Hives and Nausea And Vomiting    Latex Rash    Eggs Or Egg-Derived Products      Patient had an allergy test and stated that she was allergic to eggs    Gabapentin      Dizziness and rash, irregular heart rate    Invokana [Canagliflozin]     Levemir [Insulin Detemir] Nausea And Vomiting    Metformin      diarrhea    Evaristo Bury [Insulin Degludec]     Victoza [Liraglutide] Nausea And Vomiting    Flaxseed [Linseed Oil] Rash    Omega 3 [Fish Oil] Rash    Simvastatin Rash     Patients reports able to tolerate medication and is currently taking        Past Medical History:  Past Medical History:   Diagnosis Date    Anxiety     Depression     Diabetes     Hyperlipemia     Hypertensive disorder     Hypokalemia     Neuropathy     Stroke 2012    TIA (transient ischemic attack)         Past Surgical History:  Past Surgical History:   Procedure Laterality Date    HYSTERECTOMY      REDUCTION MAMMAPLASTY      TONSILLECTOMY      urinary bladder         Family History:  Family History   Problem Relation Age of Onset  Diabetes Mother     Diabetes Maternal Grandmother        Social History:  Social History     Socioeconomic History    Marital status: Married     Spouse name: Not on file    Number of children: Not on file    Years of education: Not on file    Highest education level: Not on file   Occupational History    Not on file   Social Needs    Financial resource strain: Not on file    Food insecurity     Worry: Not on file     Inability: Not on file    Transportation needs     Medical: Not on file     Non-medical: Not on file   Tobacco Use    Smoking status: Never Smoker    Smokeless tobacco: Never Used   Substance and Sexual Activity    Alcohol use: No    Drug use: No    Sexual activity: Not on file     Comment: married, not working ,   Lifestyle    Physical activity     Days per week: Not on file     Minutes per session: Not on file    Stress: Not on file   Relationships    Social connections     Talks on phone: Not on file     Gets together: Not on file     Attends religious service: Not on file     Active member of club or organization: Not on file     Attends meetings of clubs or organizations: Not on file     Relationship status: Not on file    Intimate partner violence     Fear of current or ex partner: Not on file     Emotionally abused: Not on file     Physically abused: Not on file     Forced sexual activity: Not on file   Other Topics Concern    Not on file   Social History Narrative    Not on file       The following sections were reviewed this encounter by the provider:   Tobacco   Allergies   Meds   Problems   Med Hx   Surg Hx   Fam Hx         ROS:  A complete 12 point ROS was obtained. Pertinent positives and negatives as noted in HPI. All other  systems are negative.      Vitals:  BP 149/85 (BP Site: Left arm, Patient Position: Sitting, Cuff Size: Large)    Pulse 82    Temp (!) 96.7 F (35.9 C) (Temporal)    Resp 16    Ht 1.6 m (5\' 3" )    Wt 89.8 kg (198 lb)    SpO2 95%    BMI 35.07 kg/m    Wt Readings from Last 3 Encounters:   07/22/19 89.8 kg (198 lb)   07/16/19 90.3 kg (199 lb)   07/01/19 89.9 kg (198 lb 4.8 oz)     - 9 lbs since 8/19    Physical Examination     General appearance: alert, appears stated age, cooperative and no distress  Eyes: conjunctivae/corneas clear. PERRL, EOM's intact.   Neck: no cervical lymphadenopathy, no carotid bruit,  supple, symmetrical, trachea midline   Thyroid:normal sized on inspection; non tender to palpation and no discrete nodules palpated  Lungs: clear to  auscultation bilaterally  Heart: regular rate and rhythm, S1, S2 normal, no murmur, click, rub or gallop  Extremities: extremities normal, atraumatic, no cyanosis or edema  Foot:  Pedal pulses 2+ bilaterally; no lesions or ulcers; sensation intact to monofilament and vibration by TF testing   Skin: Skin color, texture, turgor normal. No rashes or lesions  Neurologic: Alert and oriented X 3, normal strength and tone. Normal symmetric reflexes. No tremor  Foot Exam: monofilament testing of plantar aspect of first toe and metatarsal joints (10g monofilament) intact bilaterally, no fissures, maceration, ulceration bilaterally, normal hair growth bilaterally, normal pigmentation bilaterally and vibratory perception (128 Hz tuning fork) applied to first toe intact bilaterally      Assessment and Plan     1. Uncontrolled type 2 diabetes mellitus with hyperglycemia  Lab Results   Component Value Date    HGBA1C 7.4 (H) 05/11/2019    HGBA1C 8.7 (H) 10/11/2018    HGBA1C 8.1 (H) 05/21/2018       Current A1C is elevated above goal and trend has not changed compared to prior values..  Recommend optimizing low carbohydrate diet efforts and drinking at least 60 ounces of H20  daily.  Exercise goal - 150 minutes of sustained cardio exercise per week. Recommend home glucometer testing AC and HS.  Retinopathy surveillance is UTD. Podiatry surveillance is UTD. Microalbumin testing is UTD within past year.    Given h/o intolerance to tresiba, toujeo, basaglar. Recommend switching to lantus solostar. Prescription has been sent to pharmacy on file.  - Hemoglobin A1C  - Comprehensive metabolic panel  - Lipid panel  - TSH  - Urine Microalbumin Random  - insulin glargine (LANTUS SOLOSTAR) 100 UNIT/ML injection pen; Inject 10 units into skin daily at bedtime  Dispense: 15 mL; Refill: 0    2. Benign hypertension  BP is well controlled on the current meds.    3. Mixed hyperlipidemia  Lab Results   Component Value Date    CHOL 226 (H) 05/23/2019    CHOL 251 (H) 10/11/2018    CHOL 234 (H) 05/21/2018     Lab Results   Component Value Date    HDL 41 05/23/2019    HDL 46 10/11/2018    HDL 44 05/21/2018     Lab Results   Component Value Date    LDL 160 (H) 05/23/2019    LDL 182 (H) 10/11/2018    LDL 153 (H) 05/21/2018     Lab Results   Component Value Date    TRIG 125 05/23/2019    TRIG 114 10/11/2018    TRIG 184 (H) 05/21/2018       On statins; well tolerated.  - Lipid panel    4. Obesity (BMI 30-39.9)  Recommend calorie restricted diet and exercise for weight loss      5. Body mass index 34.0-34.9, adult      RTC in 5 months    Tanya Nones MD MPH  Endocrinologist, [facility]

## 2019-07-25 ENCOUNTER — Emergency Department
Admission: EM | Admit: 2019-07-25 | Discharge: 2019-07-26 | Disposition: A | Discharge status: Home / Self Care | Payer: Medicare Other | Attending: Emergency Medicine | Admitting: Emergency Medicine

## 2019-07-25 DIAGNOSIS — E1140 Type 2 diabetes mellitus with diabetic neuropathy, unspecified: Secondary | ICD-10-CM | POA: Insufficient documentation

## 2019-07-25 DIAGNOSIS — Z8673 Personal history of transient ischemic attack (TIA), and cerebral infarction without residual deficits: Secondary | ICD-10-CM | POA: Insufficient documentation

## 2019-07-25 DIAGNOSIS — I10 Essential (primary) hypertension: Secondary | ICD-10-CM | POA: Insufficient documentation

## 2019-07-25 DIAGNOSIS — E785 Hyperlipidemia, unspecified: Secondary | ICD-10-CM | POA: Insufficient documentation

## 2019-07-25 DIAGNOSIS — R079 Chest pain, unspecified: Secondary | ICD-10-CM

## 2019-07-25 DIAGNOSIS — Z794 Long term (current) use of insulin: Secondary | ICD-10-CM | POA: Insufficient documentation

## 2019-07-25 DIAGNOSIS — Z7982 Long term (current) use of aspirin: Secondary | ICD-10-CM | POA: Insufficient documentation

## 2019-07-25 LAB — COMPREHENSIVE METABOLIC PANEL
ALT: 16 U/L (ref 0–55)
AST (SGOT): 20 U/L (ref 5–34)
Albumin/Globulin Ratio: 1.3 (ref 0.9–2.2)
Albumin: 3.9 g/dL (ref 3.5–5.0)
Alkaline Phosphatase: 80 U/L (ref 37–106)
Anion Gap: 11 (ref 5.0–15.0)
BUN: 13 mg/dL (ref 7.0–19.0)
Bilirubin, Total: 0.4 mg/dL (ref 0.2–1.2)
CO2: 26 mEq/L (ref 22–29)
Calcium: 9 mg/dL (ref 8.5–10.5)
Chloride: 102 mEq/L (ref 100–111)
Creatinine: 0.8 mg/dL (ref 0.6–1.0)
Globulin: 3.1 g/dL (ref 2.0–3.6)
Glucose: 165 mg/dL — ABNORMAL HIGH (ref 70–100)
Potassium: 3.9 mEq/L (ref 3.5–5.1)
Protein, Total: 7 g/dL (ref 6.0–8.3)
Sodium: 139 mEq/L (ref 136–145)

## 2019-07-25 LAB — CBC AND DIFFERENTIAL
Absolute NRBC: 0 10*3/uL (ref 0.00–0.00)
Basophils Absolute Automated: 0.03 10*3/uL (ref 0.00–0.08)
Basophils Automated: 0.4 %
Eosinophils Absolute Automated: 0.05 10*3/uL (ref 0.00–0.44)
Eosinophils Automated: 0.6 %
Hematocrit: 37.7 % (ref 34.7–43.7)
Hgb: 11.8 g/dL (ref 11.4–14.8)
Immature Granulocytes Absolute: 0.01 10*3/uL (ref 0.00–0.07)
Immature Granulocytes: 0.1 %
Lymphocytes Absolute Automated: 1.68 10*3/uL (ref 0.42–3.22)
Lymphocytes Automated: 21.6 %
MCH: 27.8 pg (ref 25.1–33.5)
MCHC: 31.3 g/dL — ABNORMAL LOW (ref 31.5–35.8)
MCV: 88.7 fL (ref 78.0–96.0)
MPV: 13.2 fL — ABNORMAL HIGH (ref 8.9–12.5)
Monocytes Absolute Automated: 0.43 10*3/uL (ref 0.21–0.85)
Monocytes: 5.5 %
Neutrophils Absolute: 5.59 10*3/uL (ref 1.10–6.33)
Neutrophils: 71.8 %
Nucleated RBC: 0 /100 WBC (ref 0.0–0.0)
Platelets: 146 10*3/uL (ref 142–346)
RBC: 4.25 10*6/uL (ref 3.90–5.10)
RDW: 14 % (ref 11–15)
WBC: 7.79 10*3/uL (ref 3.10–9.50)

## 2019-07-25 LAB — MAGNESIUM: Magnesium: 1.8 mg/dL (ref 1.6–2.6)

## 2019-07-25 LAB — GFR: EGFR: >60

## 2019-07-25 LAB — TROPONIN I: Troponin I: 0.01 ng/mL (ref 0.00–0.05)

## 2019-07-25 MED ORDER — VERAPAMIL HCL ER 240 MG PO TBCR
120.00 mg | EXTENDED_RELEASE_TABLET | Freq: Once | ORAL | Status: AC
Start: 2019-07-26 — End: 2019-07-26
  Administered 2019-07-26: 120 mg via ORAL
  Filled 2019-07-25: qty 1

## 2019-07-25 MED ORDER — VERAPAMIL HCL ER 120 MG PO TBCR
120.00 mg | EXTENDED_RELEASE_TABLET | Freq: Every evening | ORAL | 0 refills | Status: DC
Start: 2019-07-25 — End: 2019-08-13

## 2019-07-25 NOTE — ED Provider Notes (Signed)
History     Chief Complaint   Patient presents with    Hypertension     67 yof c/o high blood pressure. She called EMS and when they arrive her sbp was greater than 200 but in the ambulance it was in the 160s. She is diabetic. She has had a prior stroke. She was on losartan hctz and amlodipine. She had to stop the hctz because her K was low. She stopped that over a month ago. She stopped amlodipine 3 weeks ago because it caused nausea and a headache. She was positive for COVID one month ago. She is asymptomatic now. She is prescribed losartan 50 mg bid. She had to take 4 tabs today instead of 2. She has not been able to get an appt with her pcp. She does see [facility].     The history is provided by the patient.   Hypertension  Severity:  Moderate  Onset quality:  Gradual  Duration:  3 weeks  Timing:  Constant  Context: medication change    Relieved by:  Angiotensin blockers  Associated symptoms: anxiety and nausea    Associated symptoms: no abdominal pain, no chest pain, no dizziness, no fatigue, no fever, no headaches, no palpitations, no shortness of breath, no syncope and not vomiting    Risk factors: diabetes, obesity and prior stroke         Past Medical History:   Diagnosis Date    Anxiety     Depression     Diabetes     Hyperlipemia     Hypertensive disorder     Hypokalemia     Neuropathy     Stroke 2012    TIA (transient ischemic attack)        Past Surgical History:   Procedure Laterality Date    HYSTERECTOMY      REDUCTION MAMMAPLASTY      TONSILLECTOMY      urinary bladder         Family History   Problem Relation Age of Onset    Diabetes Mother     Diabetes Maternal Grandmother        Social  Social History     Tobacco Use    Smoking status: Never Smoker    Smokeless tobacco: Never Used   Substance Use Topics    Alcohol use: No    Drug use: No       .     Allergies   Allergen Reactions    Codeine Nausea And Vomiting    Crestor [Rosuvastatin] Rash     D/c due to severe rash and  higher blood sugars    Glucosamine Hives and Nausea And Vomiting    Latex Rash    Eggs Or Egg-Derived Products      Patient had an allergy test and stated that she was allergic to eggs    Gabapentin      Dizziness and rash, irregular heart rate    Invokana [Canagliflozin]     Levemir [Insulin Detemir] Nausea And Vomiting    Metformin      diarrhea    Evaristo Bury [Insulin Degludec]     Victoza [Liraglutide] Nausea And Vomiting    Flaxseed [Linseed Oil] Rash    Omega 3 [Fish Oil] Rash    Simvastatin Rash     Patients reports able to tolerate medication and is currently taking        Home Medications  aspirin 81 MG chewable tablet     Chew 162 mg by mouth daily     glipiZIDE (GLUCOTROL) 10 MG tablet     Take 1 tablet (10 mg total) by mouth every morning with breakfast     insulin glargine (LANTUS SOLOSTAR) 100 UNIT/ML injection pen     Inject 10 units into skin daily at bedtime     Patient taking differently: Inject 10 units into skin daily at bedtime STATES ONLY TAKES IF NEEDED       linaGLIPtin (Tradjenta) 5 MG Tab     Take 1 tablet (5 mg total) by mouth daily     LORazepam (ATIVAN) 0.5 MG tablet     Take 0.5 tablets (0.25 mg total) by mouth nightly as needed for Anxiety     losartan (COZAAR) 50 MG tablet     Take 1 tablet (50 mg total) by mouth 2 (two) times daily     pregabalin (LYRICA) 50 MG capsule     TK 1 C PO QAM FOR 1 WEEK THEN TK 2 C PO BID     simvastatin (ZOCOR) 10 MG tablet     Take 10 mg by mouth every other day              Review of Systems   Constitutional: Negative for chills, diaphoresis, fatigue and fever.   HENT: Negative for rhinorrhea and sore throat.    Eyes: Negative for pain and discharge.   Respiratory: Negative for cough and shortness of breath.    Cardiovascular: Negative for chest pain, palpitations and syncope.   Gastrointestinal: Positive for nausea. Negative for abdominal pain and vomiting.   Genitourinary: Negative for dysuria and frequency.   Musculoskeletal:  Negative for arthralgias and myalgias.   Skin: Negative for color change and rash.   Neurological: Negative for dizziness and headaches.   Psychiatric/Behavioral: Negative for suicidal ideas. The patient is nervous/anxious.        Physical Exam    BP: 155/80, Heart Rate: 78, Temp: 97.2 F (36.2 C), Resp Rate: 16, SpO2: 98 %, Weight: 90.9 kg    Physical Exam  Constitutional:       General: She is not in acute distress.     Appearance: She is well-developed. She is not ill-appearing or diaphoretic.   HENT:      Head: Normocephalic and atraumatic.   Eyes:      General:         Right eye: No discharge.         Left eye: No discharge.      Conjunctiva/sclera: Conjunctivae normal.   Cardiovascular:      Rate and Rhythm: Normal rate and regular rhythm.   Pulmonary:      Effort: Pulmonary effort is normal. No respiratory distress.   Abdominal:      Palpations: Abdomen is soft.      Tenderness: There is no abdominal tenderness.   Musculoskeletal:         General: No tenderness.   Skin:     General: Skin is warm and dry.      Findings: No rash.   Neurological:      General: No focal deficit present.      Mental Status: She is alert and oriented to person, place, and time.      Cranial Nerves: No cranial nerve deficit.   Psychiatric:         Behavior: Behavior normal.  MDM and ED Course     ED Medication Orders (From admission, onward)    Start Ordered     Status Ordering Provider    07/26/19 0015 07/25/19 2357  verapamil ER (CALAN-SR) tablet 120 mg  Once     Route: Oral  Ordered Dose: 120 mg     Last MAR action: Given Geetika Laborde J             MDM    EKG Interpretation 07/25/19 T ime 2233  EKG interpreted by ED physician  Rate: Normal for age, 60  Rhythm: Normal sinus rhythm  Axis: Normal for age  PR, QRS and QT intervals:  normal for age and rate  ST Segments: No deviations suggestive of ischemia  Impression: Normal ECG with no evidence of ischemia.  No change 07/01/19  Attending : Dr. Elyn Aquas      Results     Procedure Component Value Units Date/Time    Troponin I [[id number]] Collected: 07/25/19 2234    Specimen: Blood Updated: 07/25/19 2302     Troponin I 0.01 ng/mL     GFR [[id number]] Collected: 07/25/19 2234     Updated: 07/25/19 2259     EGFR >60.0    Comprehensive metabolic panel [[id number]]  (Abnormal) Collected: 07/25/19 2234    Specimen: Blood Updated: 07/25/19 2259     Glucose 165 mg/dL      BUN 84.6 mg/dL      Creatinine 0.8 mg/dL      Sodium 962 mEq/L      Potassium 3.9 mEq/L      Chloride 102 mEq/L      CO2 26 mEq/L      Calcium 9.0 mg/dL      Protein, Total 7.0 g/dL      Albumin 3.9 g/dL      AST (SGOT) 20 U/L      ALT 16 U/L      Alkaline Phosphatase 80 U/L      Bilirubin, Total 0.4 mg/dL      Globulin 3.1 g/dL      Albumin/Globulin Ratio 1.3     Anion Gap 11.0    Magnesium [[id number]] Collected: 07/25/19 2234    Specimen: Blood Updated: 07/25/19 2259     Magnesium 1.8 mg/dL     CBC and differential [[id number]]  (Abnormal) Collected: 07/25/19 2234    Specimen: Blood Updated: 07/25/19 2240     WBC 7.79 x10 3/uL      Hgb 11.8 g/dL      Hematocrit 66.4 %      Platelets 146 x10 3/uL      RBC 4.25 x10 6/uL      MCV 88.7 fL      MCH 27.8 pg      MCHC 31.3 g/dL      RDW 14 %      MPV 13.2 fL      Neutrophils 71.8 %      Lymphocytes Automated 21.6 %      Monocytes 5.5 %      Eosinophils Automated 0.6 %      Basophils Automated 0.4 %      Immature Granulocytes 0.1 %      Nucleated RBC 0.0 /100 WBC      Neutrophils Absolute 5.59 x10 3/uL      Lymphocytes Absolute Automated 1.68 x10 3/uL      Monocytes Absolute Automated 0.43 x10 3/uL      Eosinophils Absolute Automated 0.05 x10 3/uL  Basophils Absolute Automated 0.03 x10 3/uL      Immature Granulocytes Absolute 0.01 x10 3/uL      Absolute NRBC 0.00 x10 3/uL           Radiology Results (24 Hour)     ** No results found for the last 24 hours. **          I have reviewed all labs and/or radiological studies. I have reviewed all xrays if any myself on the  PACS system.      Discussed case with Dr. Marnette Burgess who recommends starting lowest dose of verapamil and f/u with televisit in 1-2 weeks.       Procedures    Clinical Impression & Disposition     Clinical Impression  Final diagnoses:   Essential hypertension        ED Disposition     ED Disposition Condition Date/Time Comment    Discharge  Thu Jul 25, 2019 11:45 PM Joeseph Amor discharge to home/self care.    Condition at disposition: Stable             Discharge Medication List as of 07/25/2019 11:45 PM      START taking these medications    Details   verapamil ER (CALAN-SR) 120 MG CR tablet Take 1 tablet (120 mg total) by mouth nightly, Starting Thu 07/25/2019, Normal                       Terance Ice, DO  07/27/19 [phone]

## 2019-07-25 NOTE — ED Notes (Signed)
Problem controlling BP for 1 1/2 weeks.  Today BP high she took 2 Losartan and called EMS.  States she has had some dizziness

## 2019-07-25 NOTE — Discharge Instructions (Signed)
Start taking verapamil.     Monitor blood pressure.    Follow up with Dr. Marnette Burgess in one week.     Return to the ER for chest pain, headache or other concerns.

## 2019-07-26 ENCOUNTER — Telehealth (INDEPENDENT_AMBULATORY_CARE_PROVIDER_SITE_OTHER): Payer: Self-pay | Admitting: Family Medicine

## 2019-07-26 ENCOUNTER — Encounter (INDEPENDENT_AMBULATORY_CARE_PROVIDER_SITE_OTHER): Payer: Self-pay | Admitting: Family Medicine

## 2019-07-26 DIAGNOSIS — K862 Cyst of pancreas: Secondary | ICD-10-CM | POA: Insufficient documentation

## 2019-07-26 LAB — ECG 12-LEAD
Atrial Rate: 64 {beats}/min
P Axis: 34 degrees
P-R Interval: 140 ms
Q-T Interval: 422 ms
QRS Duration: 76 ms
QTC Calculation (Bezet): 435 ms
R Axis: 63 degrees
T Axis: 33 degrees
Ventricular Rate: 64 {beats}/min

## 2019-07-26 NOTE — ED Notes (Signed)
Ambulated to BR.

## 2019-07-26 NOTE — Telephone Encounter (Signed)
Please call patient's gastroenterologist Dr. [name] and request last consult note, when received please forward it to me for my review.

## 2019-07-26 NOTE — ED Notes (Signed)
Ready for discharge attempting to find ride home.  Has called home, no answer.  Message left for family

## 2019-08-01 ENCOUNTER — Encounter (INDEPENDENT_AMBULATORY_CARE_PROVIDER_SITE_OTHER): Payer: Self-pay | Admitting: Family Medicine

## 2019-08-01 NOTE — Telephone Encounter (Signed)
Called Dr Dellia Cloud office-state they will send notes over now

## 2019-08-01 NOTE — Telephone Encounter (Signed)
Received office notes from 06/19/19 and 07/18/19 both have been scanned and routed to.Dr Delman Cheadle

## 2019-08-06 ENCOUNTER — Telehealth (INDEPENDENT_AMBULATORY_CARE_PROVIDER_SITE_OTHER): Payer: Self-pay | Admitting: Family Medicine

## 2019-08-06 NOTE — Telephone Encounter (Signed)
Please call GI Dr [name] office. Need consult note where he discussed recent MRI abdomen with pt. Pt states was done at last consult but cannot find that documentation in the notes recently received by them.

## 2019-08-08 NOTE — Telephone Encounter (Signed)
Called office state they will send

## 2019-08-08 NOTE — Telephone Encounter (Signed)
Received the fax for last 2 office notes.  06/19/19 and 07/18/19.  Bother are already under media in Epic.  Neither address the MRI

## 2019-08-13 ENCOUNTER — Encounter (INDEPENDENT_AMBULATORY_CARE_PROVIDER_SITE_OTHER): Payer: Self-pay | Admitting: Family Medicine

## 2019-08-13 ENCOUNTER — Telehealth (INDEPENDENT_AMBULATORY_CARE_PROVIDER_SITE_OTHER): Payer: Self-pay

## 2019-08-13 ENCOUNTER — Ambulatory Visit (INDEPENDENT_AMBULATORY_CARE_PROVIDER_SITE_OTHER): Payer: Medicare Other | Admitting: Family Medicine

## 2019-08-13 VITALS — BP 148/81 | HR 70 | Temp 97.9°F | Resp 16 | Wt 200.0 lb

## 2019-08-13 DIAGNOSIS — Z23 Encounter for immunization: Secondary | ICD-10-CM

## 2019-08-13 DIAGNOSIS — N644 Mastodynia: Secondary | ICD-10-CM | POA: Insufficient documentation

## 2019-08-13 DIAGNOSIS — K862 Cyst of pancreas: Secondary | ICD-10-CM

## 2019-08-13 DIAGNOSIS — E1165 Type 2 diabetes mellitus with hyperglycemia: Secondary | ICD-10-CM

## 2019-08-13 DIAGNOSIS — E1142 Type 2 diabetes mellitus with diabetic polyneuropathy: Secondary | ICD-10-CM

## 2019-08-13 DIAGNOSIS — N6320 Unspecified lump in the left breast, unspecified quadrant: Secondary | ICD-10-CM

## 2019-08-13 DIAGNOSIS — I10 Essential (primary) hypertension: Secondary | ICD-10-CM

## 2019-08-13 DIAGNOSIS — I69359 Hemiplegia and hemiparesis following cerebral infarction affecting unspecified side: Secondary | ICD-10-CM

## 2019-08-13 DIAGNOSIS — I639 Cerebral infarction, unspecified: Secondary | ICD-10-CM

## 2019-08-13 DIAGNOSIS — F419 Anxiety disorder, unspecified: Secondary | ICD-10-CM

## 2019-08-13 DIAGNOSIS — E782 Mixed hyperlipidemia: Secondary | ICD-10-CM

## 2019-08-13 DIAGNOSIS — R531 Weakness: Secondary | ICD-10-CM

## 2019-08-13 DIAGNOSIS — Z803 Family history of malignant neoplasm of breast: Secondary | ICD-10-CM

## 2019-08-13 LAB — POCT HEMOGLOBIN A1C: POCT Hgb A1C: 7 % — AB (ref 3.9–5.9)

## 2019-08-13 MED ORDER — VERAPAMIL HCL ER 120 MG PO TBCR
120.00 mg | EXTENDED_RELEASE_TABLET | Freq: Every day | ORAL | 1 refills | Status: DC
Start: 2019-08-13 — End: 2020-01-10

## 2019-08-13 MED ORDER — LOSARTAN POTASSIUM 50 MG PO TABS
50.0000 mg | ORAL_TABLET | Freq: Every evening | ORAL | 1 refills | Status: DC
Start: 2019-08-13 — End: 2020-03-17

## 2019-08-13 MED ORDER — GLUCOSE BLOOD VI STRP
ORAL_STRIP | 10 refills | Status: AC
Start: 2019-08-13 — End: ?

## 2019-08-13 NOTE — Progress Notes (Signed)
Have you seen any specialists/other providers since your last visit with [place]?    Yes-Hospital    Arm preference verified?   Yes    The patient is due for mammogram, eye exam, foot exam, influenza vaccine, shingles vaccine, pneumonia vaccine and Hep C Scree, Medicare Wellness, AD         Subjective:      Date: 08/13/2019 5:36 PM   Patient ID: Emily Rowe is a 68 y.o. female.    Chief Complaint:  Chief Complaint   Patient presents with    Hypertension       HPI  68 yr old female in for HTN , DM fu    Since last visit she has been taking verapamil ER 120 mg ,states she was not told to take losartan but Bp was high and so she started  losartan 50 mg in PM  If take losartan bid gets low BP <110/70  BP stable at home, but slightly high right before her BP med dose but after the med her BP is stable  No headache  + dizziness all the time    Hyperlipidemia- 10 mg simvastatin daily  Pt has a hyperlipidemia and is compliant with anti-lipidemic therapy.  Pt denies side effects of anti-lipidemic therapy - specifically denies myalgia.  LDL is at goal as per 2018-19 lab values.  Lab Results   Component Value Date    LDL 160 (H) 05/23/2019   restarted back on simvastatin      States anxiety and depression is worse  When neuropathy gets worse she gets anxious and this makes it worse still  PT was on celexa, but patient states that she stopped taking it  Then she saw Dr Laurell Josephs and he gave her medication  No suicidal thoughts    Was seeing Dr Darin Engels- neuro  Was referred to psych    Endo - Dr Smith Robert- seen her 07/22/19  h/o intolerance to tresiba, toujeo, basaglar  Now on lantus( states takes only if needed!),Tradjenta and glipizide daily    Recent MRI- pt says she had a conversation with her GI Dr [name]  IMPRESSION:     1. Cystic structure in the pancreatic tail. No solid enhancing nodules  are seen. Follow-up MRI with and without contrast in 6 months is  recommended.  2. No intra or extrahepatic biliary  dilatation. No evidence of  pancreatic divisum.  3. Other findings as above.    [name], MD   07/16/2019 1:44 PM      Complains of pain in her left breast and also incidentally found a lump  Patient has a previous history of breast biopsy but she states that the lump is not at the same spot  Daughter diagnosed last year with breast cancer at age 46  Last mammogram patient thinks is more than 2 years    Problem List:  Patient Active Problem List   Diagnosis    Uncontrolled type 2 diabetes mellitus with hyperglycemia    Mixed hyperlipidemia    Long-term insulin use    Obesity (BMI 30-39.9)    Neuropathy    Stroke    Essential hypertension    Diabetic peripheral neuropathy    History of hemorrhagic stroke with residual hemiparesis    Type 2 diabetes mellitus treated with insulin    COVID-19 virus infection    Pancreas cyst    Breast pain, left       Current Medications:  Current Outpatient Medications  Medication Sig Dispense Refill    aspirin 81 MG chewable tablet Chew 162 mg by mouth daily      glipiZIDE (GLUCOTROL) 10 MG tablet Take 1 tablet (10 mg total) by mouth every morning with breakfast 90 tablet 1    insulin glargine (LANTUS SOLOSTAR) 100 UNIT/ML injection pen Inject 10 units into skin daily at bedtime (Patient taking differently: Inject 10 units into skin daily at bedtime STATES ONLY TAKES IF NEEDED  ) 15 mL 0    linaGLIPtin (Tradjenta) 5 MG Tab Take 1 tablet (5 mg total) by mouth daily 90 tablet 1    LORazepam (ATIVAN) 0.5 MG tablet Take 0.5 tablets (0.25 mg total) by mouth nightly as needed for Anxiety 10 tablet 0    losartan (COZAAR) 50 MG tablet Take 1 tablet (50 mg total) by mouth nightly 90 tablet 1    simvastatin (ZOCOR) 10 MG tablet Take 10 mg by mouth every other day         verapamil ER (CALAN-SR) 120 MG CR tablet Take 1 tablet (120 mg total) by mouth daily 90 tablet 1    glucose blood test strip Check sugar three times a day, ICD 10 E11.9 100 each 10     No current  facility-administered medications for this visit.        Allergies:  Allergies   Allergen Reactions    Codeine Nausea And Vomiting    Crestor [Rosuvastatin] Rash     D/c due to severe rash and higher blood sugars    Glucosamine Hives and Nausea And Vomiting    Latex Rash    Eggs Or Egg-Derived Products      Patient had an allergy test and stated that she was allergic to eggs    Gabapentin      Dizziness and rash, irregular heart rate    Invokana [Canagliflozin]     Levemir [Insulin Detemir] Nausea And Vomiting    Lyrica [Pregabalin]      PT STATES IT MAKES HER NEUROPATHY WORSE STATES SHE FEELS LIKE SHE IS ON FIRE    Metformin      diarrhea    Evaristo Bury [Insulin Degludec]     Victoza [Liraglutide] Nausea And Vomiting    Flaxseed [Linseed Oil] Rash    Omega 3 [Fish Oil] Rash    Simvastatin Rash     Patients reports able to tolerate medication and is currently taking        Past Medical History:  Past Medical History:   Diagnosis Date    Anxiety     Depression     Diabetes     Hyperlipemia     Hypertensive disorder     Hypokalemia     Neuropathy     Stroke 2012    TIA (transient ischemic attack)        Past Surgical History:  Past Surgical History:   Procedure Laterality Date    HYSTERECTOMY      REDUCTION MAMMAPLASTY      TONSILLECTOMY      urinary bladder         Family History:  Family History   Problem Relation Age of Onset    Diabetes Mother     Diabetes Maternal Grandmother     Breast cancer Daughter        Social History:  Social History     Socioeconomic History    Marital status: Married     Spouse name: Not on file    Number of  children: Not on file    Years of education: Not on file    Highest education level: Not on file   Occupational History    Not on file   Social Needs    Financial resource strain: Not on file    Food insecurity     Worry: Not on file     Inability: Not on file    Transportation needs     Medical: Not on file     Non-medical: Not on file   Tobacco  Use    Smoking status: Never Smoker    Smokeless tobacco: Never Used   Substance and Sexual Activity    Alcohol use: No    Drug use: No    Sexual activity: Not on file     Comment: married, not working ,   Lifestyle    Physical activity     Days per week: Not on file     Minutes per session: Not on file    Stress: Not on file   Relationships    Social connections     Talks on phone: Not on file     Gets together: Not on file     Attends religious service: Not on file     Active member of club or organization: Not on file     Attends meetings of clubs or organizations: Not on file     Relationship status: Not on file    Intimate partner violence     Fear of current or ex partner: Not on file     Emotionally abused: Not on file     Physically abused: Not on file     Forced sexual activity: Not on file   Other Topics Concern    Not on file   Social History Narrative    Not on file       The following sections were reviewed this encounter by the provider:   Tobacco   Allergies   Meds   Problems   Med Hx   Surg Hx   Fam Hx            Vitals:  BP 148/81 (BP Site: Left arm, Patient Position: Sitting, Cuff Size: Large)    Pulse 70    Temp 97.9 F (36.6 C) (Oral)    Resp 16    Wt 90.7 kg (200 lb)    SpO2 96%    BMI 35.43 kg/m         ROS:  General/Constitutional:   Denies Chills. Denies Fatigue. Denies Fever.   Ophthalmologic:   Denies Blurred vision. Denies Eye Pain.   ENT:   Denies Nasal Discharge. Denies Ear pain. Denies Sinus pain.   Endocrine:   Denies Polydipsia. Denies Polyuria.   Respiratory:   Denies Cough. Denies Orthopnea. Denies Shortness of breath. Denies Wheezing.   Cardiovascular:   Denies Chest pain. Denies Chest pain with exertion. Denies Leg Claudication. Denies  Palpitations. Denies Swelling in hands/feet.   Gastrointestinal:   Denies Abdominal pain. Denies Blood in stool. Denies Constipation. Denies Diarrhea.  Denies Heartburn. Denies Nausea. Denies Vomiting.   Genitourinary:   Denies Blood  in urine. Denies Frequent urination. Denies Painful urination.   Musculoskeletal:   + Leg cramps. + Muscle aches.   Skin:   Denies Skin lesion(s).   Neurologic:   Denies Dizziness. +Gait abnormality. Denies Headache. + Tingling/Numbness.     Physical Exam:  Alert and oriented. Affect is normal and appropriate. Clear thought process  with normal reasoning and conversational tone and logic. No psychomotor retardation. Not in any acute distress  Skin: no rash or abnormal lesions  Eyes - normal eye movements.  Pupils equal and reactive to light  Eyelid normal on exam  Ears - TM clear, external ear normal  Nose - no acute changes  Throat - Oral mucosa moist and pink, no abnormality noted  Neck- No thyroid enlargement noted, no palpable cervical lymph nodes  HEART: S1S2 heard, regular rate and rhythm  LUNGS: good air exchange, no crackles or ronchi or wheezing  ABDOMEN: Positive bowel sounds, no hepatospleenomegaly appreciated, no tenderness   Back: Normal curvature, no tenderness.   Extremities: FROM, no deformities, no edema, no erythema.   No knee effusion.   mild tendernesson the joint line.  Neuro: Physiological, speech better than before.   Right side weakness.   Restricted and only able to abduct <20%, Right leg weakness.   Strength RUE and RLE 3/5.         Skin: Normal, no rashes, no lesions noted.   Extremities: Warm, well perfused, no edema. Fingers stiff with some contracture L> R    Breast:  Left breast-at the 3 o'clock position lateral to the nipple there is about 1 cm firm to hard smooth lump  No skin changes    Dyslipidemia:  Lab Results   Component Value Date    CHOL 226 (H) 05/23/2019    CHOL 251 (H) 10/11/2018    TRIG 125 05/23/2019    TRIG 114 10/11/2018    HDL 41 05/23/2019    HDL 46 10/11/2018    LDL 160 (H) 05/23/2019    LDL 182 (H) 10/11/2018       Lab Results   Component Value Date    HGBA1C 7.0 (A) 08/13/2019    HGBA1C 7.4 (H) 05/11/2019    HGBA1C 8.7 (H) 10/11/2018     Assessment/Plan:       1.  Essential hypertension  - verapamil ER (CALAN-SR) 120 MG CR tablet; Take 1 tablet (120 mg total) by mouth daily  Dispense: 90 tablet; Refill: 1  - losartan (COZAAR) 50 MG tablet; Take 1 tablet (50 mg total) by mouth nightly  Dispense: 90 tablet; Refill: 1  HTN - stable and improved  Maintain verapamil in AM, and losartan PM  2. Cyst of pancreas  Patient has a cyst in the pancreas and need a 19-month follow-up  According to her she has discussed this with the gastroenterologist Dr. Rhetta Mura but there is no documentations available at this time  According to patient he had instructed that she needs to have a 32-month follow-up but nothing acute needs to be done at this time  3. Cerebrovascular accident (CVA), unspecified mechanism  - Pneumococcal polysaccharide vaccine 23-valent greater than or equal to 2yo subcutaneous/IM; Future  Will update her pneumonia vaccine  Patient also need flu shots, so she is going to get flu shot today and return for her pneumonia vaccine  Patient has significant mobility issue and is requesting to get physical therapy  4. History of hemorrhagic stroke with residual hemiparesis    5. Uncontrolled type 2 diabetes mellitus with hyperglycemia  - Comprehensive metabolic panel; Future  - Lipid panel; Future  - Urine Microalbumin Random; Future  - POCT Hemoglobin A1C  - Pneumococcal polysaccharide vaccine 23-valent greater than or equal to 2yo subcutaneous/IM; Future  - Referral to Ophthalmology - EXTERNAL  - glucose blood test strip; Check sugar three times a day, ICD  10 E11.9  Dispense: 100 each; Refill: 10    6. Diabetic peripheral neuropathy  - Comprehensive metabolic panel; Future  - Lipid panel; Future  - Urine Microalbumin Random; Future  - POCT Hemoglobin A1C  - Referral to Ophthalmology - EXTERNAL  - glucose blood test strip; Check sugar three times a day, ICD 10 E11.9  Dispense: 100 each; Refill: 10    7. Mixed hyperlipidemia  - Lipid panel; Future    8. Need for vaccination  - Flu vacc  quad recombinant pres free 18 yrs& up  - Pneumococcal polysaccharide vaccine 23-valent greater than or equal to 2yo subcutaneous/IM; Future    9. Lump of left breast  - Mammo Diagnostic w/Tomo Bilat; Future  Importance of getting diagnostic mammogram as soon as possible and following up with me with results discussed  If she does the mammogram and does not hear back from me in 2 to 3 days advised to call me back  Daughter had her breast cancer at age 83 recently  Patient has a previous history of biopsy and was followed by Dr. Annamary Carolin  10. Family history of breast cancer  - Mammo Diagnostic w/Tomo Bilat; Future    11. Breast pain, left  - Mammo Diagnostic w/Tomo Bilat; Future    12. Anxiety  - Ambulatory referral to Psychiatry  Patient at this time is not taking any of her medications  She was on Celexa but stopped taking it  She is at this time requesting a psychiatrist referral-local area doctors and the emergency services available has been printed with contact information and has been advised to call and set up an appointment    Return for fasting lab next week, Medicare AWV in 4 weeks Fu with mammo.      This note was generated by the Epic EMR system/ Dragon speech recognition and may contain inherent errors or omissions not intended by the user. Grammatical errors, random word insertions, deletions, pronoun errors and incomplete sentences are occasional consequences of this technology due to software limitations. Not all errors are caught or corrected. If there are questions or concerns about the content of this note or information contained within the body of this dictation they should be addressed directly with the author for clarification      Dinero Chavira Bernadene Person, MD

## 2019-08-13 NOTE — Telephone Encounter (Signed)
Pt forgot to ask during visit if she can get a PT referral due to the cold wether coming and she states she "does not move well when its cold"  States she would like a referral to a facility she can go to, not home PT referral.

## 2019-08-14 NOTE — Telephone Encounter (Signed)
Order placed

## 2019-08-15 NOTE — Telephone Encounter (Signed)
Order mailed to pts home address verified the day of visit.

## 2019-08-19 ENCOUNTER — Other Ambulatory Visit (INDEPENDENT_AMBULATORY_CARE_PROVIDER_SITE_OTHER): Payer: Self-pay

## 2019-08-19 DIAGNOSIS — E1165 Type 2 diabetes mellitus with hyperglycemia: Secondary | ICD-10-CM

## 2019-08-19 NOTE — Telephone Encounter (Signed)
Received refill request from [facility] for Glipizide ER 10 mg .    LV 07/22/19  UV 11/21/19

## 2019-08-21 ENCOUNTER — Other Ambulatory Visit (FREE_STANDING_LABORATORY_FACILITY): Payer: Medicare Other

## 2019-08-21 ENCOUNTER — Telehealth (INDEPENDENT_AMBULATORY_CARE_PROVIDER_SITE_OTHER): Payer: Self-pay | Admitting: Family Medicine

## 2019-08-21 DIAGNOSIS — E782 Mixed hyperlipidemia: Secondary | ICD-10-CM

## 2019-08-21 DIAGNOSIS — E1142 Type 2 diabetes mellitus with diabetic polyneuropathy: Secondary | ICD-10-CM

## 2019-08-21 DIAGNOSIS — E1165 Type 2 diabetes mellitus with hyperglycemia: Secondary | ICD-10-CM

## 2019-08-21 LAB — LIPID PANEL
Cholesterol / HDL Ratio: 4.2
Cholesterol: 188 mg/dL (ref 0–199)
HDL: 45 mg/dL (ref 40–9999)
LDL Calculated: 119 mg/dL — ABNORMAL HIGH (ref 0–99)
Triglycerides: 120 mg/dL (ref 34–149)
VLDL Calculated: 24 mg/dL (ref 10–40)

## 2019-08-21 LAB — COMPREHENSIVE METABOLIC PANEL
ALT: 16 U/L (ref 0–55)
AST (SGOT): 19 U/L (ref 5–34)
Albumin/Globulin Ratio: 1.1 (ref 0.9–2.2)
Albumin: 3.8 g/dL (ref 3.5–5.0)
Alkaline Phosphatase: 84 U/L (ref 37–106)
Anion Gap: 8 (ref 5.0–15.0)
BUN: 16 mg/dL (ref 7.0–19.0)
Bilirubin, Total: 0.3 mg/dL (ref 0.2–1.2)
CO2: 29 mEq/L (ref 21–29)
Calcium: 8.8 mg/dL (ref 8.5–10.5)
Chloride: 102 mEq/L (ref 100–111)
Creatinine: 1 mg/dL (ref 0.4–1.5)
Globulin: 3.4 g/dL (ref 2.0–3.7)
Glucose: 141 mg/dL — ABNORMAL HIGH (ref 70–100)
Potassium: 4.4 mEq/L (ref 3.5–5.1)
Protein, Total: 7.2 g/dL (ref 6.0–8.3)
Sodium: 139 mEq/L (ref 136–145)

## 2019-08-21 LAB — MICROALBUMIN, RANDOM URINE
Urine Creatinine, Random: 67.8 mg/dL
Urine Microalbumin, Random: <5 (ref 0.0–30.0)

## 2019-08-21 LAB — HEMOLYSIS INDEX: Hemolysis Index: 1 (ref 0–18)

## 2019-08-21 LAB — GFR: EGFR: 55.5

## 2019-08-21 MED ORDER — GLIPIZIDE 10 MG PO TABS
10.0000 mg | ORAL_TABLET | Freq: Every morning | ORAL | 1 refills | Status: DC
Start: 2019-08-21 — End: 2019-11-21

## 2019-08-21 NOTE — Telephone Encounter (Signed)
Patient let pt know. She should not change medication on her own. Advise monitor BP at home . She should contcat her cardiologist ASAP. If any acute s/s like chest pain,SOB, worsening of weakness(she has already had a stroke) or any neurological symptoms like headache,speech change etc should go to ER immediately. Please let pt know. If need to see me can come in tomorrow with all her medication.

## 2019-08-21 NOTE — Telephone Encounter (Signed)
Pt presents in office stating her blood pressure medication is causing her to have dizziness all throughout the day. Pt would like a call back.     Please advise.   [phone]

## 2019-08-21 NOTE — Telephone Encounter (Signed)
Called and spoke with patient. Recommendations givens. She verbalized understating. Will call cardiologist ASAP.

## 2019-08-21 NOTE — Telephone Encounter (Signed)
LMTCB x 1 

## 2019-08-26 ENCOUNTER — Telehealth (INDEPENDENT_AMBULATORY_CARE_PROVIDER_SITE_OTHER): Payer: Self-pay | Admitting: Internal Medicine

## 2019-08-26 NOTE — Telephone Encounter (Signed)
Pharmacy is calling for clarification on medication. Requesting a call back to [phone] option 2

## 2019-08-27 NOTE — Telephone Encounter (Signed)
Spoke with Wynona Canes, pharmacist at [email or web address] to clarify Rx as previous Rx was for glipizide 10 mg BID and has been changed to 1 table every morning with breakfast.

## 2019-09-02 ENCOUNTER — Other Ambulatory Visit: Payer: Self-pay | Admitting: Family Medicine

## 2019-09-02 ENCOUNTER — Other Ambulatory Visit (INDEPENDENT_AMBULATORY_CARE_PROVIDER_SITE_OTHER): Payer: Self-pay

## 2019-09-02 DIAGNOSIS — N6320 Unspecified lump in the left breast, unspecified quadrant: Secondary | ICD-10-CM

## 2019-09-02 DIAGNOSIS — Z803 Family history of malignant neoplasm of breast: Secondary | ICD-10-CM

## 2019-09-02 DIAGNOSIS — N644 Mastodynia: Secondary | ICD-10-CM

## 2019-09-05 ENCOUNTER — Ambulatory Visit (FREE_STANDING_LABORATORY_FACILITY): Payer: Medicare Other | Admitting: Family Medicine

## 2019-09-05 ENCOUNTER — Encounter (INDEPENDENT_AMBULATORY_CARE_PROVIDER_SITE_OTHER): Payer: Self-pay | Admitting: Family Medicine

## 2019-09-05 VITALS — BP 141/84 | HR 73 | Temp 98.0°F | Resp 16 | Ht 65.0 in | Wt 198.8 lb

## 2019-09-05 DIAGNOSIS — F419 Anxiety disorder, unspecified: Secondary | ICD-10-CM

## 2019-09-05 DIAGNOSIS — Z13820 Encounter for screening for osteoporosis: Secondary | ICD-10-CM

## 2019-09-05 DIAGNOSIS — Z23 Encounter for immunization: Secondary | ICD-10-CM

## 2019-09-05 DIAGNOSIS — Z803 Family history of malignant neoplasm of breast: Secondary | ICD-10-CM

## 2019-09-05 DIAGNOSIS — N644 Mastodynia: Secondary | ICD-10-CM

## 2019-09-05 DIAGNOSIS — Z0184 Encounter for antibody response examination: Secondary | ICD-10-CM

## 2019-09-05 DIAGNOSIS — Z0000 Encounter for general adult medical examination without abnormal findings: Secondary | ICD-10-CM

## 2019-09-05 DIAGNOSIS — E1142 Type 2 diabetes mellitus with diabetic polyneuropathy: Secondary | ICD-10-CM

## 2019-09-05 LAB — HEMOLYSIS INDEX: Hemolysis Index: 6 (ref 0–18)

## 2019-09-05 LAB — HEPATITIS C ANTIBODY: Hepatitis C, AB: NONREACTIVE

## 2019-09-05 LAB — HEPATITIS B SURFACE ANTIBODY: HEPATITIS B SURFACE ANTIBODY: <8

## 2019-09-05 MED ORDER — LORAZEPAM 0.5 MG PO TABS
0.2500 mg | ORAL_TABLET | Freq: Every evening | ORAL | 0 refills | Status: DC | PRN
Start: 2019-09-05 — End: 2020-12-30

## 2019-09-05 NOTE — Patient Instructions (Signed)
Womens Preventive Wellness Plan  Todays Date: September 05, 2019    Patient Name:Emily Rowe    Date of Birth: Mar 06, 1951     As part of your wellness benefit, Medicare makes many screening tests available to you at no charge.  A complete list of these tests can be found at their website, [email or web address]. However, many of these tests or recommendations are out of date, or may not apply to you. After careful consideration of your own personal health needs, the following testing is recommended for you:      Preventive Service    Up-to-date (UTD)/Due/Not Applicable (N/A)   Last Done   Medicare Frequency   Body Mass Index   Up-to-date September 05, 2019  (BMI):Body mass index is 33.08 kg/m.   Height:Height: 165.1 cm (5\' 5" )  Weight:Weight: 90.2 kg (198 lb 12.8 oz)  Annually   Blood Pressure: Up-to-date September 05, 2019    BP: 141/84    Every 2 yrs, if BP </= 120/80 mm hg   Annually, if BP >120-139/80-89 mm hg   Cholesterol Testing Up-to-date Lab Results   Component Value Date    LDL 119 (H) 08/21/2019      Regularly beginning at age 66 with risk factors   Diabetes Screening Up-to-date Lab Results   Component Value Date    GLU 141 (H) 08/21/2019       If prediabetes, one screening every 6 months   Otherwise, one screening every 12 months with certain risk factors for diabetes   Osteoporosis Screening   (Bone Density Measurement)  Due   Routinely, for women aged 65+   Routinely, for women aged 60-64 with risk factors   Breast Cancer Screening   (Mammogram) Up-to-date   Every 2 yrs, aged 92-74 yrs   Cervical Cancer Screening   (Pap Smear)  Not applicable   Annually if at high risk for developing cervical or vaginal cancer or childbearing age with abnormal Pap test within past 3 years; Every 2 years for women at normal risk   HPV: Once every 5 years; All asymptomatic female Medicare beneficiaries aged 31 to 78 years    Colorectal Cancer Screening Up-to-date and going for colonosocpy   Annually, Fecal Occult Blood Stool (FOBS)   Every 5 yrs, Sigmoidoscopy with FOBS   Every 10 yrs, Colonoscopy   Every 3 yrs, Cologuard   Depression Screening Up-to-date September 05, 2019   As necessary for those with risk factors   Sexually Transmitted Diseases (STDs) & HIV Screening Not applicable   As necessary for those with risk factors   Alcohol Misuse Screening Not applicable   As necessary for those with risk factors   Immunizations:   Shingrix is due and Pneumovax is due Immunization History   Administered Date(s) Administered    Influenza Vacc QUAD Recombinant PF 53yrs & up 08/13/2019    Td 08/30/1995    Tetanus 09/04/2012     Prevnar 13: 1 dose after age 63   Pneumovax 23: 1 dose 1 year after Prevnar   Influenza: Annually   Advance Directive Advanced directive form given to patient   Once; update as needed   Medical Nutrition Therapy Not applicable   As necessary for diabetes or renal disease   Smoking Cessation Counseling Not applicable Counseling given: Not Answered    Frequency: two cessation attempts per year.   Glaucoma Screening Up-to-date   Annually for covered high risk Medicare beneficiaries (one of the following: DM, FHx Glaucoma, African-Americans aged  50+, Hispanic-Americans aged 65+)   Hepatitis C Virus (HCV) Screening Due   Annually only for high risk behavior   Once if born between 31 and 1965 and are not considered high risk   Lung Cancer Screening Not applicable   Annually if asymptomatic, tobacco smoking history of at least 30 pack-years (one pack-year = smoking one pack per day for one year; 1 pack = 20 cigarettes), and current smoker or one who has quit smoking within the last 15 years     Your major risk factors:       Diabetes, Hypertension, Obesity and Sedentary lifestyle     Recommendations for improvement:    Exercise, Compliance with prescription medications and Low carb diet     Referrals:     See After Visit Summary orders

## 2019-09-05 NOTE — Progress Notes (Signed)
Emily Rowe Emily Rowe is a 68 y.o. female who presents today for a Medicare Annual Wellness Visit.     Health Risk Assessment     During the past month, how would you rate your general health?:  Poor  Which of the following tasks can you do without assistance - drive or take the bus alone; shop for groceries or clothes; prepare your own meals; do your own housework/laundry; handle your own finances/pay bills; eat, bathe or get around your home?:  Handle your own finances/pay bills, Shop for groceries or clothes, Eat, bathe, dress or get around your home, Prepare your own meals, Do your own housework/laundry  Which of the following problems have you been bothered by in the past month - dizzy when standing up; problems using the phone; feeling tired or fatigued; moderate or severe body pain?: Moderate or severe body pain  Do you exercise for about 20 minutes 3 or more days per week?:  No  During the past month was someone available to help if you needed and wanted help?  For example, if you felt nervous, lonely, got sick and had to stay in bed, needed someone to talk to, needed help with daily chores or needed help just taking care of yourself.: No  Do you always wear a seat belt?: No(SOMETIMES)  Do you have any trouble taking medications the way you have been told to take them?: Yes  Have you been given any information that can help you with keeping track of your medications?: Yes  Do you have trouble paying for your medications?: Yes(SOMETIMES)  Have you been given any information that can help you with hazards in your house, such as scatter rugs, furniture, etc?: Yes  Do you feel unsteady when standing or walking?: Yes  Do you worry about falling?: Yes  Have you fallen two or more times in the past year?: No  Did you suffer any injuries from your falls in the past year?: No    Additional Concerns  Since last visit she has called and waiting to get in psychiatrist.  Discussed IPAC  Patient is using  lorazepam as needed, was given in the emergency room-is requesting a short-term refill until she can be seen by a psychiatrist  Printed IPAC address and contact information and given it to patient to call and set up an appointment     HTN - states taking verapamil qd but occasionally 2 a day if high BP and on losaratn 50 mg QAM  OCC BEEN USING AMLODIPINE 5 MG On top f this on her own!!  Again I stressed the fact that patient should not be adjusting her medication on her own  Recommended follow-up with cardiology    States Anxiety meds makes her feels"burning"  Tried celexa  " I have fibromyalgia"    Appropriate screenings:  - Colorectal cancer screening- 2013 and has appt 09/18/19      - Mammography 09/02/19- no acute finding  Still feels pain  Not feeling lump    - Bone density testing-due  - pap - Has had hysterectomy- with BSO - for excess bleeding    - Hepatitis C     VACCINATION:  - Influenza UTD  -pneumococcal-due  - hepatitis b-?   - shingles vaccine- Shingrix-due  - TDaP vaccinations 2013    Nonsmoker  No alcohol use    Patient Care Team:  Sabas Sous, MD as PCP - General (Family Medicine)  Arlean Hopping, MD  [name],  Barbette Hair, MD as Consulting Physician (Gastroenterology)    Past Medical History:   Diagnosis Date    Anxiety     Depression     Diabetes     Hyperlipemia     Hypertensive disorder     Hypokalemia     Neuropathy     Stroke 2012    TIA (transient ischemic attack)      Past Surgical History:   Procedure Laterality Date    HYSTERECTOMY      REDUCTION MAMMAPLASTY      TONSILLECTOMY      urinary bladder       Allergies   Allergen Reactions    Codeine Nausea And Vomiting    Crestor [Rosuvastatin] Rash     D/c due to severe rash and higher blood sugars    Glucosamine Hives and Nausea And Vomiting    Latex Rash    Eggs Or Egg-Derived Products      Patient had an allergy test and stated that she was allergic to eggs    Gabapentin      Dizziness and rash, irregular  heart rate    Invokana [Canagliflozin]     Levemir [Insulin Detemir] Nausea And Vomiting    Lyrica [Pregabalin]      PT STATES IT MAKES HER NEUROPATHY WORSE STATES SHE FEELS LIKE SHE IS ON FIRE    Metformin      diarrhea    Tresiba [Insulin Degludec]     Victoza [Liraglutide] Nausea And Vomiting    Flaxseed [Linseed Oil] Rash    Omega 3 [Fish Oil] Rash    Simvastatin Rash     Patients reports able to tolerate medication and is currently taking       Current Outpatient Medications   Medication Sig Dispense Refill    aspirin 81 MG chewable tablet Chew 162 mg by mouth daily      glipiZIDE (GLUCOTROL) 10 MG tablet Take 1 tablet (10 mg total) by mouth every morning with breakfast 90 tablet 1    glucose blood test strip Check sugar three times a day, ICD 10 E11.9 100 each 10    insulin glargine (LANTUS SOLOSTAR) 100 UNIT/ML injection pen Inject 10 units into skin daily at bedtime (Patient taking differently: Inject 10 units into skin daily at bedtime STATES ONLY TAKES IF NEEDED  ) 15 mL 0    linaGLIPtin (Tradjenta) 5 MG Tab Take 1 tablet (5 mg total) by mouth daily 90 tablet 1    LORazepam (ATIVAN) 0.5 MG tablet Take 0.5 tablets (0.25 mg total) by mouth nightly as needed for Anxiety 20 tablet 0    losartan (COZAAR) 50 MG tablet Take 1 tablet (50 mg total) by mouth nightly 90 tablet 1    simvastatin (ZOCOR) 10 MG tablet Take 10 mg by mouth every other day         verapamil ER (CALAN-SR) 120 MG CR tablet Take 1 tablet (120 mg total) by mouth daily (Patient taking differently: Take 120 mg by mouth daily STATES SHE TAKES 2 DAILY ON SOME DAYS  ) 90 tablet 1     No current facility-administered medications for this visit.       Social History     Tobacco Use    Smoking status: Never Smoker    Smokeless tobacco: Never Used   Substance Use Topics    Alcohol use: No    Drug use: No      Family History   Problem Relation Age  of Onset    Diabetes Mother     Diabetes Maternal Grandmother     Breast cancer  Daughter         The following sections were reviewed this encounter by the provider:   Tobacco   Allergies   Meds   Problems   Med Hx   Surg Hx   Fam Hx          Hospitalizations  hospitalization within past 6 months - discharge diagnosis Hypertension    Depression Screening    See related Activity or Flowsheet    Functional Ability    Falls Risk:  home has throw rugs, poor lighting, or slippery bath tub/shower  Hearing:  hearing within normal limits  Exercise:  Light ( i.e. stretching or slow walking ) and walking and rower  ADL's:   Bathing - independent   Dressing - independent   Mobility - independent   Transfer - independent   Eating - independent}   Toileting - independent   ADL assistance not needed    Discussion of Advance Directives: Has no Advanced Directive. Form provided.     Assessment    BP 141/84 (BP Site: Left arm, Patient Position: Sitting, Cuff Size: Large)    Pulse 73    Temp 98 F (36.7 C) (Oral)    Resp 16    Ht 1.651 m (5\' 5" )    Wt 90.2 kg (198 lb 12.8 oz)    SpO2 98%    BMI 33.08 kg/m      Vision Screening (required for IPPE only): Patient states eye exam performed elsewhere within past 12 months  Screening EKG (IPPE only): not indicated        Evaluation of Cognitive Function    Mood/affect: Appropriate  Appearance: neatly groomed, appropriately and adequately nourished  Family member/caregiver input: Not present        AWV Mini-Cog Result:  been forgetful following stroke. seeing neurology  Lab Results   Component Value Date    WBC 7.79 07/25/2019    HGB 11.8 07/25/2019    HCT 37.7 07/25/2019    PLT 146 07/25/2019    CHOL 188 08/21/2019    TRIG 120 08/21/2019    HDL 45 08/21/2019    LDL 119 (H) 08/21/2019    ALT 16 08/21/2019    AST 19 08/21/2019    NA 139 08/21/2019    K 4.4 08/21/2019    CL 102 08/21/2019    CREAT 1.0 08/21/2019    BUN 16.0 08/21/2019    CO2 29 08/21/2019    TSH 1.46 05/23/2019    INR 1.0 05/10/2019    GLU 141 (H) 08/21/2019    HGBA1C 7.0 (A) 08/13/2019        Assessment/Plan    1. Routine general medical examination at a health care facility  Patient is up-to-date on most of her blood work  Will check hepatitis B immunity and hep C antibody for screening  2. Breast pain, left  - Breast Surgery Referral: Henderson Baltimore, MD ([place])    3. Family history of breast cancer  - Breast Surgery Referral: Henderson Baltimore, MD ([place])    4. Mastalgia  - Breast Surgery Referral: Henderson Baltimore, MD ([facility])  Patient still continues to have some pain in her breast  Previously has had breast surgery  Mammogram currently did not show any acute finding  We will seek the help of breast surgeon-patient agreed to call and set up an appointment  5. Osteoporosis screening  - Dxa Bone Density Axial Skeleton; Future    6. Need for vaccination  - Pneumococcal polysaccharide vaccine 23-valent greater than or equal to 2yo subcutaneous/IM  Will still be due for Shingrix-Future order for Shingrix has been placed to do in our office  7. Diabetic peripheral neuropathy    8. Immunity status testing  - Hepatitis C (HCV) antibody, Total  - Hepatitis B (HBV) Surface Antibody Quant    9. Anxiety  - LORazepam (ATIVAN) 0.5 MG tablet; Take 0.5 tablets (0.25 mg total) by mouth nightly as needed for Anxiety  Dispense: 20 tablet; Refill: 0  Relaxing techniques, deep breathing, warm baths, warm tea, distraction exercises reviewed with patient, yoga, message therapy, accupuncture, speaking with someone, family/friends/professional.  Benzodiazepine medications carry risks for tolerance, dependence, addiction, withdrawal, high risk of falls, dizziness, nausea, sedation,: when used in overdose or with alcohol can lead to severe respiratory depression and death. Patient understands risks of medication.  Patient does not want to start any SSRIs at this time or SNRI stating that she has had reactions before  She is willing to see psychiatrist  Referral has been given see HPI      Patient Instructions        [facility] Wellness Plan  Todays Date: September 05, 2019    Patient Name:Emily Rowe    Date of Birth: 1951/05/03     As part of your wellness benefit, Medicare makes many screening tests available to you at no charge.  A complete list of these tests can be found at their website, [email or web address]. However, many of these tests or recommendations are out of date, or may not apply to you. After careful consideration of your own personal health needs, the following testing is recommended for you:      Preventive Service    Up-to-date (UTD)/Due/Not Applicable (N/A)   Last Done   Medicare Frequency   Body Mass Index   Up-to-date September 05, 2019  (BMI):Body mass index is 33.08 kg/m.   Height:Height: 165.1 cm (5\' 5" )  Weight:Weight: 90.2 kg (198 lb 12.8 oz)  Annually   Blood Pressure: Up-to-date September 05, 2019    BP: 141/84    Every 2 yrs, if BP </= 120/80 mm hg   Annually, if BP >120-139/80-89 mm hg   Cholesterol Testing Up-to-date Lab Results   Component Value Date    LDL 119 (H) 08/21/2019      Regularly beginning at age 73 with risk factors   Diabetes Screening Up-to-date Lab Results   Component Value Date    GLU 141 (H) 08/21/2019       If prediabetes, one screening every 6 months   Otherwise, one screening every 12 months with certain risk factors for diabetes   Osteoporosis Screening   (Bone Density Measurement)  Due   Routinely, for women aged 65+   Routinely, for women aged 60-64 with risk factors   Breast Cancer Screening   (Mammogram) Up-to-date   Every 2 yrs, aged 17-74 yrs   Cervical Cancer Screening   (Pap Smear)  Not applicable   Annually if at high risk for developing cervical or vaginal cancer or childbearing age with abnormal Pap test within past 3 years; Every 2 years for women at normal risk   HPV: Once every 5 years; All asymptomatic female Medicare beneficiaries aged 66 to 33 years    Colorectal Cancer Screening Up-to-date and going for colonosocpy   Annually, Fecal Occult Blood Stool (  FOBS)   Every 5 yrs, Sigmoidoscopy with FOBS   Every 10 yrs, Colonoscopy   Every 3 yrs, Cologuard   Depression Screening Up-to-date September 05, 2019   As necessary for those with risk factors   Sexually Transmitted Diseases (STDs) & HIV Screening Not applicable   As necessary for those with risk factors   Alcohol Misuse Screening Not applicable   As necessary for those with risk factors   Immunizations:   Shingrix is due and Pneumovax is due Immunization History   Administered Date(s) Administered    Influenza Vacc QUAD Recombinant PF 38yrs & up 08/13/2019    Td 08/30/1995    Tetanus 09/04/2012     Prevnar 13: 1 dose after age 57   Pneumovax 23: 1 dose 1 year after Prevnar   Influenza: Annually   Advance Directive Advanced directive form given to patient   Once; update as needed   Medical Nutrition Therapy Not applicable   As necessary for diabetes or renal disease   Smoking Cessation Counseling Not applicable Counseling given: Not Answered    Frequency: two cessation attempts per year.   Glaucoma Screening Up-to-date   Annually for covered high risk Medicare beneficiaries (one of the following: DM, FHx Glaucoma, African-Americans aged 53+, Hispanic-Americans aged 65+)   Hepatitis C Virus (HCV) Screening Due   Annually only for high risk behavior   Once if born between 56 and 1965 and are not considered high risk   Lung Cancer Screening Not applicable   Annually if asymptomatic, tobacco smoking history of at least 30 pack-years (one pack-year = smoking one pack per day for one year; 1 pack = 20 cigarettes), and current smoker or one who has quit smoking within the last 15 years     Your major risk factors:       Diabetes, Hypertension, Obesity and Sedentary lifestyle     Recommendations for improvement:    Exercise, Compliance with prescription medications and Low carb diet     Referrals:     See After Visit Summary orders       Tayo Maute Bernadene Person, MD  09/05/2019

## 2019-09-10 ENCOUNTER — Encounter (INDEPENDENT_AMBULATORY_CARE_PROVIDER_SITE_OTHER): Payer: Self-pay

## 2019-09-17 ENCOUNTER — Ambulatory Visit (INDEPENDENT_AMBULATORY_CARE_PROVIDER_SITE_OTHER): Payer: Self-pay | Admitting: Cardiovascular Disease

## 2019-09-18 ENCOUNTER — Encounter (INDEPENDENT_AMBULATORY_CARE_PROVIDER_SITE_OTHER): Payer: Self-pay | Admitting: Family Medicine

## 2019-09-20 ENCOUNTER — Encounter (INDEPENDENT_AMBULATORY_CARE_PROVIDER_SITE_OTHER): Payer: Self-pay | Admitting: Family Medicine

## 2019-09-24 ENCOUNTER — Ambulatory Visit
Admission: RE | Admit: 2019-09-24 | Discharge: 2019-09-24 | Disposition: A | Discharge status: Home / Self Care | Payer: Medicare Other | Source: Ambulatory Visit | Attending: Family Medicine | Admitting: Family Medicine

## 2019-09-24 ENCOUNTER — Encounter (INDEPENDENT_AMBULATORY_CARE_PROVIDER_SITE_OTHER): Payer: Self-pay

## 2019-09-24 DIAGNOSIS — Z780 Asymptomatic menopausal state: Secondary | ICD-10-CM | POA: Insufficient documentation

## 2019-09-24 DIAGNOSIS — Z13820 Encounter for screening for osteoporosis: Secondary | ICD-10-CM | POA: Insufficient documentation

## 2019-09-25 ENCOUNTER — Ambulatory Visit (INDEPENDENT_AMBULATORY_CARE_PROVIDER_SITE_OTHER): Payer: Medicare Other | Admitting: "Endocrinology

## 2019-09-26 ENCOUNTER — Encounter (INDEPENDENT_AMBULATORY_CARE_PROVIDER_SITE_OTHER): Payer: Self-pay | Admitting: Family Medicine

## 2019-09-30 ENCOUNTER — Ambulatory Visit (INDEPENDENT_AMBULATORY_CARE_PROVIDER_SITE_OTHER): Payer: Self-pay

## 2019-10-08 ENCOUNTER — Ambulatory Visit (INDEPENDENT_AMBULATORY_CARE_PROVIDER_SITE_OTHER): Payer: Self-pay | Admitting: Family Nurse Practitioner

## 2019-10-09 ENCOUNTER — Inpatient Hospital Stay
Admission: EM | Admit: 2019-10-09 | Discharge: 2019-10-11 | DRG: 920 | Disposition: A | Discharge status: Home / Self Care | Payer: Medicare Other | Attending: Surgery | Admitting: Surgery

## 2019-10-09 ENCOUNTER — Emergency Department: Payer: Medicare Other

## 2019-10-09 DIAGNOSIS — E782 Mixed hyperlipidemia: Secondary | ICD-10-CM | POA: Diagnosis present

## 2019-10-09 DIAGNOSIS — Z79899 Other long term (current) drug therapy: Secondary | ICD-10-CM

## 2019-10-09 DIAGNOSIS — F419 Anxiety disorder, unspecified: Secondary | ICD-10-CM | POA: Diagnosis present

## 2019-10-09 DIAGNOSIS — E1165 Type 2 diabetes mellitus with hyperglycemia: Secondary | ICD-10-CM | POA: Diagnosis present

## 2019-10-09 DIAGNOSIS — F418 Other specified anxiety disorders: Secondary | ICD-10-CM

## 2019-10-09 DIAGNOSIS — E1142 Type 2 diabetes mellitus with diabetic polyneuropathy: Secondary | ICD-10-CM | POA: Diagnosis present

## 2019-10-09 DIAGNOSIS — Z794 Long term (current) use of insulin: Secondary | ICD-10-CM

## 2019-10-09 DIAGNOSIS — Z91040 Latex allergy status: Secondary | ICD-10-CM

## 2019-10-09 DIAGNOSIS — E785 Hyperlipidemia, unspecified: Secondary | ICD-10-CM

## 2019-10-09 DIAGNOSIS — Z8619 Personal history of other infectious and parasitic diseases: Secondary | ICD-10-CM

## 2019-10-09 DIAGNOSIS — Z7982 Long term (current) use of aspirin: Secondary | ICD-10-CM

## 2019-10-09 DIAGNOSIS — Z8673 Personal history of transient ischemic attack (TIA), and cerebral infarction without residual deficits: Secondary | ICD-10-CM

## 2019-10-09 DIAGNOSIS — R198 Other specified symptoms and signs involving the digestive system and abdomen: Secondary | ICD-10-CM | POA: Diagnosis present

## 2019-10-09 DIAGNOSIS — K9189 Other postprocedural complications and disorders of digestive system: Secondary | ICD-10-CM

## 2019-10-09 DIAGNOSIS — Z6836 Body mass index (BMI) 36.0-36.9, adult: Secondary | ICD-10-CM

## 2019-10-09 DIAGNOSIS — K9171 Accidental puncture and laceration of a digestive system organ or structure during a digestive system procedure: Principal | ICD-10-CM | POA: Diagnosis present

## 2019-10-09 DIAGNOSIS — Y848 Other medical procedures as the cause of abnormal reaction of the patient, or of later complication, without mention of misadventure at the time of the procedure: Secondary | ICD-10-CM | POA: Diagnosis present

## 2019-10-09 DIAGNOSIS — K869 Disease of pancreas, unspecified: Secondary | ICD-10-CM

## 2019-10-09 DIAGNOSIS — E669 Obesity, unspecified: Secondary | ICD-10-CM | POA: Diagnosis present

## 2019-10-09 DIAGNOSIS — E875 Hyperkalemia: Secondary | ICD-10-CM | POA: Diagnosis present

## 2019-10-09 DIAGNOSIS — K862 Cyst of pancreas: Secondary | ICD-10-CM | POA: Diagnosis present

## 2019-10-09 DIAGNOSIS — E1140 Type 2 diabetes mellitus with diabetic neuropathy, unspecified: Secondary | ICD-10-CM

## 2019-10-09 DIAGNOSIS — D696 Thrombocytopenia, unspecified: Secondary | ICD-10-CM | POA: Diagnosis present

## 2019-10-09 DIAGNOSIS — I10 Essential (primary) hypertension: Secondary | ICD-10-CM | POA: Diagnosis present

## 2019-10-09 DIAGNOSIS — F329 Major depressive disorder, single episode, unspecified: Secondary | ICD-10-CM

## 2019-10-09 DIAGNOSIS — K631 Perforation of intestine (nontraumatic): Secondary | ICD-10-CM

## 2019-10-09 DIAGNOSIS — Z9114 Patient's other noncompliance with medication regimen: Secondary | ICD-10-CM

## 2019-10-09 HISTORY — DX: Unspecified asthma, uncomplicated: J45.909

## 2019-10-09 LAB — COMPREHENSIVE METABOLIC PANEL
ALT: 17 U/L (ref 0–55)
AST (SGOT): 30 U/L (ref 5–34)
Albumin/Globulin Ratio: 1 (ref 0.9–2.2)
Albumin: 3.9 g/dL (ref 3.5–5.0)
Alkaline Phosphatase: 81 U/L (ref 37–106)
Anion Gap: 12 (ref 5.0–15.0)
BUN: 15.6 mg/dL (ref 7.0–19.0)
Bilirubin, Total: 0.4 mg/dL (ref 0.2–1.2)
CO2: 22 mEq/L (ref 22–29)
Calcium: 8.2 mg/dL — ABNORMAL LOW (ref 8.5–10.5)
Chloride: 103 mEq/L (ref 100–111)
Creatinine: 0.8 mg/dL (ref 0.6–1.0)
Globulin: 3.8 g/dL — ABNORMAL HIGH (ref 2.0–3.6)
Glucose: 125 mg/dL — ABNORMAL HIGH (ref 70–100)
Potassium: 5.9 mEq/L — ABNORMAL HIGH (ref 3.5–5.1)
Protein, Total: 7.7 g/dL (ref 6.0–8.3)
Sodium: 137 mEq/L (ref 136–145)

## 2019-10-09 LAB — CBC AND DIFFERENTIAL
Absolute NRBC: 0 10*3/uL (ref 0.00–0.00)
Basophils Absolute Automated: 0.03 10*3/uL (ref 0.00–0.08)
Basophils Automated: 0.4 %
Eosinophils Absolute Automated: 0.03 10*3/uL (ref 0.00–0.44)
Eosinophils Automated: 0.4 %
Hematocrit: 38.1 % (ref 34.7–43.7)
Hgb: 12.2 g/dL (ref 11.4–14.8)
Immature Granulocytes Absolute: 0.03 10*3/uL (ref 0.00–0.07)
Immature Granulocytes: 0.4 %
Lymphocytes Absolute Automated: 1.86 10*3/uL (ref 0.42–3.22)
Lymphocytes Automated: 22 %
MCH: 28.3 pg (ref 25.1–33.5)
MCHC: 32 g/dL (ref 31.5–35.8)
MCV: 88.4 fL (ref 78.0–96.0)
MPV: 13.6 fL — ABNORMAL HIGH (ref 8.9–12.5)
Monocytes Absolute Automated: 0.45 10*3/uL (ref 0.21–0.85)
Monocytes: 5.3 %
Neutrophils Absolute: 6.07 10*3/uL (ref 1.10–6.33)
Neutrophils: 71.5 %
Nucleated RBC: 0 /100 WBC (ref 0.0–0.0)
Platelets: 96 10*3/uL — ABNORMAL LOW (ref 142–346)
RBC: 4.31 10*6/uL (ref 3.90–5.10)
RDW: 14 % (ref 11–15)
WBC: 8.47 10*3/uL (ref 3.10–9.50)

## 2019-10-09 LAB — CELL MORPHOLOGY
Cell Morphology: ABNORMAL — AB
Platelet Estimate: DECREASED — AB

## 2019-10-09 LAB — GLUCOSE WHOLE BLOOD - POCT
Whole Blood Glucose POCT: 105 mg/dL — ABNORMAL HIGH (ref 70–100)
Whole Blood Glucose POCT: 112 mg/dL — ABNORMAL HIGH (ref 70–100)
Whole Blood Glucose POCT: 123 mg/dL — ABNORMAL HIGH (ref 70–100)
Whole Blood Glucose POCT: 137 mg/dL — ABNORMAL HIGH (ref 70–100)

## 2019-10-09 LAB — BASIC METABOLIC PANEL
Anion Gap: 14 (ref 5.0–15.0)
BUN: 10.6 mg/dL (ref 7.0–19.0)
CO2: 26 mEq/L (ref 22–29)
Calcium: 8.8 mg/dL (ref 8.5–10.5)
Chloride: 100 mEq/L (ref 100–111)
Creatinine: 0.9 mg/dL (ref 0.6–1.0)
Glucose: 143 mg/dL — ABNORMAL HIGH (ref 70–100)
Potassium: 4.4 mEq/L (ref 3.5–5.1)
Sodium: 140 mEq/L (ref 136–145)

## 2019-10-09 LAB — GFR
EGFR: >60
EGFR: >60

## 2019-10-09 MED ORDER — FLUCONAZOLE IN SODIUM CHLORIDE 200-0.9 MG/100ML-% IV SOLN
200.00 mg | INTRAVENOUS | Status: DC
Start: 2019-10-09 — End: 2019-10-11
  Administered 2019-10-09 – 2019-10-10 (×2): 200 mg via INTRAVENOUS
  Filled 2019-10-09 (×3): qty 100

## 2019-10-09 MED ORDER — HYDRALAZINE HCL 20 MG/ML IJ SOLN
10.00 mg | Freq: Four times a day (QID) | INTRAMUSCULAR | Status: DC | PRN
Start: 2019-10-09 — End: 2019-10-11
  Administered 2019-10-09: 10 mg via INTRAVENOUS
  Filled 2019-10-09: qty 1

## 2019-10-09 MED ORDER — VERAPAMIL HCL ER 240 MG PO TBCR
120.00 mg | EXTENDED_RELEASE_TABLET | Freq: Every day | ORAL | Status: DC
Start: 2019-10-09 — End: 2019-10-11
  Administered 2019-10-09 – 2019-10-11 (×3): 120 mg via ORAL
  Filled 2019-10-09 (×3): qty 1

## 2019-10-09 MED ORDER — NALOXONE HCL 0.4 MG/ML IJ SOLN (WRAP)
0.20 mg | INTRAMUSCULAR | Status: DC | PRN
Start: 2019-10-09 — End: 2019-10-11

## 2019-10-09 MED ORDER — SODIUM CHLORIDE 0.9 % IV BOLUS
500.00 mL | Freq: Once | INTRAVENOUS | Status: AC
Start: 2019-10-09 — End: 2019-10-09
  Administered 2019-10-09: 500 mL via INTRAVENOUS

## 2019-10-09 MED ORDER — DEXTROSE 50 % IV SOLN
12.50 g | INTRAVENOUS | Status: DC | PRN
Start: 2019-10-09 — End: 2019-10-09

## 2019-10-09 MED ORDER — GLUCOSE 40 % PO GEL
15.00 g | ORAL | Status: DC | PRN
Start: 2019-10-09 — End: 2019-10-09

## 2019-10-09 MED ORDER — VERAPAMIL HCL ER 240 MG PO TBCR
120.00 mg | EXTENDED_RELEASE_TABLET | Freq: Every day | ORAL | Status: DC
Start: 2019-10-09 — End: 2019-10-09
  Filled 2019-10-09 (×2): qty 1

## 2019-10-09 MED ORDER — GLUCOSE 40 % PO GEL
15.00 g | ORAL | Status: DC | PRN
Start: 2019-10-09 — End: 2019-10-11

## 2019-10-09 MED ORDER — LORAZEPAM 0.5 MG PO TABS
0.2500 mg | ORAL_TABLET | Freq: Every evening | ORAL | Status: DC | PRN
Start: 2019-10-09 — End: 2019-10-11
  Administered 2019-10-09 – 2019-10-10 (×2): 0.25 mg via ORAL
  Filled 2019-10-09 (×2): qty 1

## 2019-10-09 MED ORDER — MORPHINE SULFATE 2 MG/ML IJ/IV SOLN (WRAP)
1.0000 mg | Status: DC | PRN
Start: 2019-10-09 — End: 2019-10-11

## 2019-10-09 MED ORDER — INSULIN LISPRO 100 UNIT/ML SC SOLN
1.00 [IU] | SUBCUTANEOUS | Status: DC
Start: 2019-10-09 — End: 2019-10-09

## 2019-10-09 MED ORDER — GLUCAGON 1 MG IJ SOLR (WRAP)
1.00 mg | INTRAMUSCULAR | Status: DC | PRN
Start: 2019-10-09 — End: 2019-10-09

## 2019-10-09 MED ORDER — GLUCAGON 1 MG IJ SOLR (WRAP)
1.00 mg | INTRAMUSCULAR | Status: DC | PRN
Start: 2019-10-09 — End: 2019-10-11

## 2019-10-09 MED ORDER — HEPARIN SODIUM (PORCINE) 5000 UNIT/ML IJ SOLN
5000.00 [IU] | Freq: Three times a day (TID) | INTRAMUSCULAR | Status: DC
Start: 2019-10-10 — End: 2019-10-11
  Administered 2019-10-10 – 2019-10-11 (×3): 5000 [IU] via SUBCUTANEOUS
  Filled 2019-10-09 (×3): qty 1

## 2019-10-09 MED ORDER — IOHEXOL 350 MG/ML IV SOLN
100.00 mL | Freq: Once | INTRAVENOUS | Status: AC | PRN
Start: 2019-10-09 — End: 2019-10-09
  Administered 2019-10-09: 100 mL via INTRAVENOUS

## 2019-10-09 MED ORDER — METRONIDAZOLE IN NACL 500 MG/100 ML IV SOLN
500.00 mg | Freq: Once | INTRAVENOUS | Status: AC
Start: 2019-10-09 — End: 2019-10-09
  Administered 2019-10-09: 500 mg via INTRAVENOUS
  Filled 2019-10-09: qty 100

## 2019-10-09 MED ORDER — ACETAMINOPHEN 325 MG PO TABS
650.0000 mg | ORAL_TABLET | Freq: Four times a day (QID) | ORAL | Status: DC | PRN
Start: 2019-10-09 — End: 2019-10-11

## 2019-10-09 MED ORDER — DEXTROSE 50 % IV SOLN
12.50 g | INTRAVENOUS | Status: DC | PRN
Start: 2019-10-09 — End: 2019-10-11

## 2019-10-09 MED ORDER — TRAMADOL HCL 50 MG PO TABS
50.0000 mg | ORAL_TABLET | Freq: Four times a day (QID) | ORAL | Status: DC | PRN
Start: 2019-10-09 — End: 2019-10-11

## 2019-10-09 MED ORDER — SODIUM CHLORIDE 0.9 % IV MBP
4.50 g | Freq: Three times a day (TID) | INTRAVENOUS | Status: DC
Start: 2019-10-09 — End: 2019-10-11
  Administered 2019-10-09 – 2019-10-11 (×6): 4.5 g via INTRAVENOUS
  Filled 2019-10-09 (×6): qty 20

## 2019-10-09 MED ORDER — SODIUM CHLORIDE 0.9 % IV SOLN
INTRAVENOUS | Status: DC
Start: 2019-10-09 — End: 2019-10-11

## 2019-10-09 MED ORDER — ENOXAPARIN SODIUM 40 MG/0.4ML SC SOLN
40.00 mg | Freq: Every day | SUBCUTANEOUS | Status: DC
Start: 2019-10-10 — End: 2019-10-09

## 2019-10-09 MED ORDER — ONDANSETRON HCL 4 MG/2ML IJ SOLN
4.00 mg | Freq: Three times a day (TID) | INTRAMUSCULAR | Status: DC | PRN
Start: 2019-10-09 — End: 2019-10-11
  Administered 2019-10-09: 4 mg via INTRAVENOUS
  Filled 2019-10-09: qty 2

## 2019-10-09 MED ORDER — CIPROFLOXACIN IN D5W 400 MG/200ML IV SOLN
400.00 mg | Freq: Two times a day (BID) | INTRAVENOUS | Status: AC
Start: 2019-10-09 — End: 2019-10-09
  Administered 2019-10-09: 400 mg via INTRAVENOUS
  Filled 2019-10-09: qty 200

## 2019-10-09 NOTE — ED Notes (Signed)
Contacted dr Faythe Casa for nausea medication, no response awaiting orders

## 2019-10-09 NOTE — ED Provider Notes (Signed)
ATTENDING : Helayne Seminole, MD. I HAVE PERSONALLY SEEN AND EXAMINED PATIENT. I have personally obtained a history, performed a focused PE, and participated in the medical decision making of this patient.     I have discussed and agree with the care plan of the Advanced Care Provider.    Brief Note -patient with recent colonoscopy procedure this morning with concern for perforation.  Mild pain on lower abdomen.  No rebound or guarding.  Plan to do lab work, CAT scan the abdomen pelvis.         Helayne Seminole, MD  10/09/19 1024

## 2019-10-09 NOTE — Consults (Signed)
INFECTIOUS DISEASE CONSULT  Alfonzo Beers, MD, FACP          Date Time: 10/09/19 3:40 PM  Patient Name: Emily Rowe  Referring Physician:  Sunny Schlein, MD    Reason for consult:     Colon perforation; status post colonoscopy    History of present illness:     Emily Rowe [id number] is a 68 y.o. female, with past medical history significant for diabetes mellitus, diabetic neuropathy, obesity, history of CVA with hemorrhagic stroke, history of COVID-19 recently, anxiety, depression, history of pancreatic lesion, anxiety, depression who underwent colonoscopy this morning with Dr. Telford Nab for chronic abdominal bloating and constipation and complicated with chronic multiyear and she was sent to the emergency room with abdominal discomfort.  Denies any fevers or chills.  Her imaging is consistent with innumerable small collections of extraluminal gas in the retroperitoneum secondary to chronic perforation.  Denies any hematemesis, hemoptysis, melena.    Review of systems:     Constitutional: Complains of fatigue and malaise.  HEENT: Denies any hearing loss, ear pain, nosebleeds, congestion, sore throat, neck pain, tinnitus and ear discharge. Denies any blurred vision, double vision, photophobia, pain, discharge and redness.   Respiratory:  Denies any cough, hemoptysis, sputum production, shortness of breath, wheezing and stridor.   Cardiovascular: Denies any chest pain, palpitations, orthopnea, claudication, leg swelling.   Gastrointestinal: Complains of mild abdominal discomfort.   Genitourinary: Denies any dysuria, urgency, frequency, hematuria and flank pain.   Musculoskeletal: Denies any myalgias, back pain, joint pain and falls.   Skin: Denies any itching and rash.   Neurological: Denies any dizziness, tingling, tremors, sensory change, speech change, focal weakness, seizures, loss of consciousness, weakness and headaches.    Endo/Heme/Allergies: Denies any environmental allergies and polydipsia. Does not bruise/bleed easily.   Psychiatric/Behavioral: History of anxiety and depression.   Other review of systems are noncontributory.    Allergies:     Allergies   Allergen Reactions    Codeine Nausea And Vomiting    Crestor [Rosuvastatin] Rash     D/c due to severe rash and higher blood sugars    Glucosamine Hives and Nausea And Vomiting    Latex Rash    Eggs Or Egg-Derived Products      Patient had an allergy test and stated that she was allergic to eggs    Gabapentin      Dizziness and rash, irregular heart rate    Invokana [Canagliflozin]     Levemir [Insulin Detemir] Nausea And Vomiting    Lyrica [Pregabalin]      PT STATES IT MAKES HER NEUROPATHY WORSE STATES SHE FEELS LIKE SHE IS ON FIRE    Metformin      diarrhea    Evaristo Bury [Insulin Degludec]     Victoza [Liraglutide] Nausea And Vomiting    Flaxseed [Linseed Oil] Rash    Omega 3 [Fish Oil] Rash    Simvastatin Rash     Patients reports able to tolerate medication and is currently taking        Past medical history:     Past Medical History:   Diagnosis Date    Anxiety     Asthma     allergic reaction to mold, not since 20 years    Depression     Diabetes     Hyperlipemia     Hypertensive disorder     Hypokalemia     Neuropathy     Stroke 2012  TIA (transient ischemic attack)        Past surgical history:     Past Surgical History:   Procedure Laterality Date    HYSTERECTOMY      REDUCTION MAMMAPLASTY      TONSILLECTOMY      urinary bladder         Family history:     Family History   Problem Relation Age of Onset    Diabetes Mother     Heart attack Mother     Diabetes Maternal Grandmother     Stroke Maternal Grandmother     Breast cancer Daughter     Stroke Maternal Grandfather        Social history:     Social History     Substance and Sexual Activity   Alcohol Use No     Social History     Substance and Sexual Activity   Drug Use No      Social History     Tobacco Use   Smoking Status Never Smoker   Smokeless Tobacco Never Used       Medications:     Current Facility-Administered Medications   Medication Dose Route Frequency    fluconazole  200 mg Intravenous Q24H [facility]    piperacillin-tazobactam  4.5 g Intravenous Q8H [facility]       Physical Exam:     Blood pressure 194/76, pulse 63, temperature 97.6 F (36.4 C), temperature source Temporal, resp. rate 17, height 1.651 m (5\' 5" ), weight 93 kg (205 lb), SpO2 97 %.    General Appearance: In no acute distress.   HEENT: Pallor negative, Anicteric sclera.   Neck: Supple  Lungs:Decreased breath sound at bases.   Chest Wall: Symmetric chest wall expansion.   Heart : S1 and S2.   Abdomen: Abdomen is soft, bowel sounds positive, mild diffuse discomfort, no rebound or guarding.  Neurological: Alert and oriented to person, place and time, moves all extremities.   Psychiatric: Mood and affect is normal    Labs:     Recent Labs     10/09/19  1043   WBC 8.47   Hgb 12.2   Hematocrit 38.1   Platelets 96*   MCV 88.4       Recent Labs     10/09/19  1418 10/09/19  1043   Sodium 140 137   Potassium 4.4 5.9*   Chloride 100 103   CO2 26 22   BUN 10.6 15.6   Creatinine 0.9 0.8   Glucose 143* 125*   Calcium 8.8 8.2*       Recent Labs     10/09/19  1043   AST (SGOT) 30   ALT 17   Alkaline Phosphatase 81   Protein, Total 7.7   Albumin 3.9   Bilirubin, Total 0.4       Imaging studies:     CT: 1. There are innumerable small collections of extra luminal gas in the  retroperitoneum, presumably arising from a colonic perforation. The site  of the perforation is not clear.  2. Clips are seen in the sigmoid colon from endoscopic biopsy    Assessment :     Emily Rowe is a 68 y.o. female, with:     Colonic perforation   Status post colonoscopy   Thrombocytopenia   Diabetes mellitus   Diabetic neuropathy   Anxiety, depression   Asthma   Hypertension   History of stroke    Recommendations:     I  would  like to suggest following approach:     Zosyn 4.5 g IV every 8 hours   Diflucan 200 mg daily   Surgery follow-up   Correction of electrolytes   GI follow-up   Blood cultures if spikes more than 100.5    Discussed with treatment team   CBC, CMP tomorrow   We'll adjust the antimicrobials according to the cultures and clinical course     I will follow this patient closely with you    Thank you [facility] for involving me in care of Emily Rowe          Signed by: Alfonzo Beers, MD, FACP  Date Time: 10/09/19 3:40 PM      *This note was generated by the Epic EMR system/ Dragon speech recognition and may contain inherent errors or omissions not intended by the user. Grammatical errors, random word insertions, deletions, pronoun errors and incomplete sentences are occasional consequences of this technology due to software limitations. Not all errors are caught or corrected. If there are questions or concerns about the content of this note or information contained within the body of this dictation they should be addressed directly with the author for clarification

## 2019-10-09 NOTE — Consults (Signed)
GASTROENTEROLOGY CONSULTATION    Date Time: 10/09/19 12:33 PM  Patient Name: Emily Rowe [birth year]  Attending Physician: Helayne Seminole, MD  PCP:  Sabas Sous, MD    Assessment:     Colonic perforation, sigmoid with attempted endoscopic closure.  Interestingly, the retroperitoneal gas would indicate that the location was below the peritoneal reflection.  Recommendations:     N.p.o., IVs and antibiotics and surgical consultation.  I defer to the surgeons as to whether surgical intervention will be necessary, especially given the presenting anatomy, but there are reports of successful closure colon perforations using endoscopic technique.  Problem List:   Active Problems:    * No active hospital problems. *    History of Present Illness:   Emily Rowe is a 68 y.o. female who presents to the hospital with an observed complication during the colonoscopy.  She has a history of chronic problems with abdominal bloating and constipation and previous attempted colonoscopy 5 years ago showed a poor prep with 1 adenoma removed.  Ongoing bowel problems with abdominal discomfort have persisted despite conservative therapy.  After otherwise unrevealing work-up we decided to bring her back for another attempt at colonoscopy after a two-stage prep.  Diverticulosis with a moderately tortuous colon was quickly encountered and with a little pressure in the mid sigmoid and obvious colonic wall tear was observed with omental tissue beyond it.  The scope was held in position while Resolution clips were obtained and deployed; 4 clips were placed across the opening with reasonably good closure observed.  The patient was in no distress after the procedure but was taken to the ER.  Several hours later she has a little bit of lower abdominal discomfort.  History is otherwise notable for overweight and type 2 diabetes plus hypertension with previous stroke and she has a history of  reflux disease with mild esophagitis seen on upper endoscopy 5 years ago  2     Past Medical History:   Diagnosis Date    Anxiety     Depression     Diabetes     Hyperlipemia     Hypertensive disorder     Hypokalemia     Neuropathy     Stroke 2012    TIA (transient ischemic attack)      Past Surgical History:     Past Surgical History:   Procedure Laterality Date    HYSTERECTOMY      REDUCTION MAMMAPLASTY      TONSILLECTOMY      urinary bladder       Family History:     Family History   Problem Relation Age of Onset    Diabetes Mother     Diabetes Maternal Grandmother     Breast cancer Daughter      Social History:     Social History     Socioeconomic History    Marital status: Married     Spouse name: Not on file    Number of children: Not on file    Years of education: Not on file    Highest education level: Not on file   Occupational History    Not on file   Social Needs    Financial resource strain: Not on file    Food insecurity     Worry: Not on file     Inability: Not on file    Transportation needs     Medical: Not on file     Non-medical: Not  on file   Tobacco Use    Smoking status: Never Smoker    Smokeless tobacco: Never Used   Substance and Sexual Activity    Alcohol use: No    Drug use: No    Sexual activity: Not Currently     Comment: married, not working ,   Lifestyle    Physical activity     Days per week: Not on file     Minutes per session: Not on file    Stress: Not on file   Relationships    Social connections     Talks on phone: Not on file     Gets together: Not on file     Attends religious service: Not on file     Active member of club or organization: Not on file     Attends meetings of clubs or organizations: Not on file     Relationship status: Not on file    Intimate partner violence     Fear of current or ex partner: Not on file     Emotionally abused: Not on file     Physically abused: Not on file     Forced sexual activity: Not on file   Other Topics  Concern    Not on file   Social History Narrative    Not on file     Allergies:     Allergies   Allergen Reactions    Codeine Nausea And Vomiting    Crestor [Rosuvastatin] Rash     D/c due to severe rash and higher blood sugars    Glucosamine Hives and Nausea And Vomiting    Latex Rash    Eggs Or Egg-Derived Products      Patient had an allergy test and stated that she was allergic to eggs    Gabapentin      Dizziness and rash, irregular heart rate    Invokana [Canagliflozin]     Levemir [Insulin Detemir] Nausea And Vomiting    Lyrica [Pregabalin]      PT STATES IT MAKES HER NEUROPATHY WORSE STATES SHE FEELS LIKE SHE IS ON FIRE    Metformin      diarrhea    Evaristo Bury [Insulin Degludec]     Victoza [Liraglutide] Nausea And Vomiting    Flaxseed [Linseed Oil] Rash    Omega 3 [Fish Oil] Rash    Simvastatin Rash     Patients reports able to tolerate medication and is currently taking      Medications:   (Not in a hospital admission)    Scheduled Meds:  Current Facility-Administered Medications   Medication Dose Route Frequency    ciprofloxacin  400 mg Intravenous Q12H    metroNIDAZOLE  500 mg Intravenous Once     Continuous Infusions:  PRN Meds:.  Review of Systems:   Psychological: negative for - depression, memory difficulties, ADD or sleep disturbances  Ophthalmic: negative for - loss of vision, glaucoma, cataract, eye diseases  ENT: negative for sinusitis, nasal congestion  Respiratory: negative for - cough, shortness of breath or wheezing  Cardiovascular: negative for - chest pain, dyspnea on exertion, edema or rapid heart rate  Genito-Urinary: negative for - change in urinary stream, hematuria, stones, incontinence or urinary frequency/urgency  Musculoskeletal: negative for - joint stiffness, joint swelling or pain, back pain  Neurological: recovered from stroke negative for - confusion, dizziness, headaches, memory loss, seizures, sleep disorder or weakness  Dermatological: negative for rash, skin  cancer history  Physical Exam:  Vitals:    10/09/19 1200   BP: 151/80   Pulse: 88   Resp: 15   Temp:    SpO2: 97%     Intake and Output Summary (Last 24 hours) at Date Time    Intake/Output Summary (Last 24 hours) at 10/09/2019 1233  Last data filed at 10/09/2019 1142  Gross per 24 hour   Intake 500 ml   Output --   Net 500 ml     Mental status - alert, oriented to person, place, and time, normal mood, behavior, speech, dress, motor activity, and thought processes  Abdomen - soft, some LLQ tenderness with mild distensionNeurological - alert, oriented, normal speech, no focal findings or movement disorder noted    Labs:     Recent Labs     10/09/19  1043   WBC 8.47   Hgb 12.2   Hematocrit 38.1   Platelets 96*   Albumin 3.9   Calcium 8.2*        Recent Labs     10/09/19  1043   Sodium 137   Potassium 5.9*   Chloride 103   CO2 22   BUN 15.6   Creatinine 0.8   Glucose 125*   Albumin 3.9   ALT 17   AST (SGOT) 30   Alkaline Phosphatase 81   Bilirubin, Total 0.4          Rads:       CT Abd/ Pelvis With Iv Contrast    Result Date: 10/09/2019  Indication: Possible perforation during colonoscopy. Technique: Contrast-enhanced CT of the abdomen and pelvis. 100 mm of Omnipaque 350 were administered intravenously. No oral contrast material. Comparison: MR of the abdomen September 29. CT of the abdomen and pelvis September 14. Findings: Clips are seen in the sigmoid colon, presumably from endoscopic biopsy. There are innumerable tiny bubbles of extraluminal gas in the retroperitoneum, mostly on the right. The site of origin is unclear. No colonic wall thickening is seen. No abscess. No free intraperitoneal gas or free fluid. No dilated bowel loops. Diverticulosis of the colon is mild. A subcentimeter lesion of the left hepatic lobe is unchanged from prior exam and likely to be a tiny benign cyst or hemangioma. Unremarkable gallbladder. No dilatation of the biliary tree. Unremarkable spleen. Small cystic pancreatic lesion in the  pancreatic tail measuring 1.8 cm, 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 Unremarkable adrenals and kidneys. There is a small amount of calcified atherosclerotic plaque. No aneurysm of the abdominal aorta. There is a chronic collection in the subcutaneous changes fatty tissue of the anterior abdominal wall centered to the right of midline below the umbilicus measuring approximately 6 cm in diameter 1.5 cm in thickness, likely a consequence of prior abdominal surgery. There is some stranding of the subcutaneous fatty tissue that is possibly from liposuction. The uterus is absent. No adnexal masses or cystic lesions are seen. Included portions of the lungs and mediastinum are unremarkable.     1. There are innumerable small collections of extra luminal gas in the retroperitoneum, presumably arising from a colonic perforation. The site of the perforation is not clear. 2. Clips are seen in the sigmoid colon from endoscopic biopsy. 3. Additional chronic findings detailed above. Urgent findings were reported to and acknowledged by PA Adelene Idler at 1215 hours. Wynema Birch, MD  10/09/2019 12:19 PM    Signed by: Edrick Oh, M.D.

## 2019-10-09 NOTE — Consults (Signed)
[facility] HOSPITALIST  Consult Note  Patient Info:   Date/Time: 10/09/2019 / 2:48 PM   Admit Date:10/09/2019  Patient Name:Emily Rowe   ZOX:[id number]   PCP: Sabas Sous, MD  Attending Physician:Virmani, Edsel Petrin, MD     Assessment and Plan:   Reason for Consult: diabetes, hypertension  Thank you Helayne Seminole, MD for consulting Hospitalist service.    # Colonic perforation s/p colonoscopy (Dr. [name], 10/09/2019)  CT abdomen/pelvis: There are innumerable small collections of extra luminal gas in the  retroperitoneum, presumably arising from a colonic perforation. The site of the perforation is not clear.  Clips are seen in the sigmoid colon from endoscopic biopsy.  Pt appears non-toxic, Vital signs stable, and patient denies abdominal pain   Admit to [facility], Dr. [name]   NPO    IV hydration   IV antibiotics per primary team   Dr. [name], gastroenterology, following     # Thrombocytopenia (96)  Baseline PLT level 134-158  Blood smear: + ovalocytes  Pt is non-compliant with ASA and statin, denies taking frequent NSAIDS, no other new medications recently  No liver disease, liver and spleen evaluation on MRI 07/16/2019 was normal  No obvious bleeding and vital signs stable, H/H stable  S/p 1 liter fluid bolus in ER   Will monitor closely and will repeat with AM labs    # Hypertension  Home meds: Verapamil 120 mg twice daily and losartan 50 mg PO daily   States she has been seeing her cardiologist - [facility] Dr. [name], 1-2 times per week as her blood pressure has been uncontrolled recently   Patient NPO secondary to perforation; hold home antihypertensives    Start hydralazine 10 mg IV every 6 hours as needed systolic blood pressure greater than 170    # Hyperkalemia (5.9)   Repeat potassium level pending    # Diabetes mellitus type II  Home regimen: Glipizide XL 10 mg daily  , Tradjenta 5 mg daily , Lantus 10 units daily at bedtime   Managed by Dr. Smith Robert (last seen 07/22/2019)  Last HgA1C 7   Patient states that she has allergies to all subcutaneous insulins other than Lantus   Hold oral diabetic medications while NPO   Hold daily Lantus   Monitor blood glucose every 4 hours   Hypoglycemia protocol ordered    # Hyperlipidemia  Lipid panel 08/21/2019 - Chol 188, Trig 120, HDL 45, LDL elevated 119 (improved from 160 on 05/23/2019)   On statin at home -however she states that she does not take regularly as she does not like the side effects   Statin on hold while NPO - patient states she will not take it during this hospitalization    Outpatient followup     # 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  MRI Cystic structure in the pancreatic tail. No solid enhancing nodules are seen. Follow-up MRI with and without contrast in 6 months is recommended.  No intra or extrahepatic biliary dilatation. No evidence of  pancreatic divisum.  Other findings as above.   Plan for followup with PCP outpatient     # Anxiety/depression  Takes as needed lorazepam outpatient  Currently NPO   can initiate Ativan 0.25 IV every 8 hours as needed anxiety     F/u outpatient psychiatry - patient has stopped celexa    #  COVID19 06/27/2019  States she has had resolution in symptoms    # CVA with hemorragic stroke  Presented with left-sided weakness in 2012, has had resolution in symptoms  Recommended for daily aspirin, but is not complaint   Home ASA on hold while NPO    # Diabetic peripheral neuropathy  Has seen Dr. Laurell Josephs in the past, not currently taking medication for this     # Obesity (BMI 34.11)  Recommend lifestyle modifications    Hospital Problems:  Active Problems:    * No active hospital problems. *    DVT Prohylaxis: per surgical team  Code Status: Prior  Disposition:per surgical team  Type of Admission:Emergency  Estimated Length of  Stay (including stay in the ER receiving treatment): per surgical team  Milestones: cleared by surgical team and medically stable   Medical Necessity for stay IV antibiotics, surgical evaluation after colonic perforation    Clinical Presentation:   History of Presenting Illness:   Emily Rowe is a 68 y.o. female who has history of   Past Surgical History:   Procedure Laterality Date    HYSTERECTOMY      REDUCTION MAMMAPLASTY      TONSILLECTOMY      urinary bladder        Past Medical History:   Diagnosis Date    Anxiety     Asthma     allergic reaction to mold, not since 20 years    Depression     Diabetes     Hyperlipemia     Hypertensive disorder     Hypokalemia     Neuropathy     Stroke 2012    TIA (transient ischemic attack)       68 year old female with multiple comorbidities as listed above.  She is status post scheduled colonoscopy this morning with Dr. Rhetta Mura for history of chronic abdominal bloating and constipation.  Diverticulosis with moderately tortuous colon was encountered and the colonoscopy complicated by colonic wall tear with observation of omental tissue beyond it, endoscopic closure was attempted with resolution clips, subsequently patient was taken to the emergency room.  She was in no apparent distress after this procedure and several hours later was noted to have mild lower abdominal discomfort.  Plan for admission to surgical team with medicine consulted for management of diabetes, hypertension, and other chronic medical conditions.     CT abdomen pelvis obtained in emergency room and was notable for innumerable small collections of extra luminal gas in the retroperitoneum, presumably arising from a colonic perforation. The site of the perforation is not clear. Clips are seen in the sigmoid colon from endoscopic biopsy.    Patient currently denying pain in her abdomen, states she feels well and is anxious to go home.  She states that she is hungry.  She denies  dizziness, headache, chest pain, nausea/vomiting, cough or congestion, shortness of breath, recent exposure to illness or known COVID-19 contacts.  She states she completed her bowel prep yesterday with good effect.     Review of Systems:   A comprehensive review of systems was negative except for the underlying highlighted in Bold     Constitutional: chills, weight loss, malaise/fatigue and diaphoresis.   HENT: hearing loss, ear pain, nosebleeds, congestion, sore throat, neck pain, tinnitus and ear discharge.   Eyes: blurred vision, double vision, photophobia, pain, discharge and redness.   Respiratory: cough, hemoptysis, sputum production, shortness of breath, wheezing and stridor.   Cardiovascular: chest pain, palpitations,  orthopnea, claudication, leg swelling and PND. Uncontrolled blood pressure  Gastrointestinal: heartburn, nausea, vomiting, abdominal pain, diarrhea, constipation, blood in stool and melena.   Genitourinary: dysuria, urgency, frequency, hematuria and flank pain.   Musculoskeletal: myalgias, back pain, joint pain and falls.   Skin: itching and rash.   Neurological: dizziness, tingling, tremors, sensory change, speech change, focal weakness, seizures, loss of consciousness, weakness and headaches.   Endo/Heme/Allergies: environmental allergies and polydipsia. Does not bruise/bleed easily.   Psychiatric/Behavioral: depression, suicidal ideas, hallucinations, memory loss and substance abuse. anxiety The patient is not nervous/anxious and does not have insomnia.   Physical Exam:     Vitals:    10/09/19 1300 10/09/19 1330 10/09/19 1400 10/09/19 1430   BP: 156/81 140/81 141/69 159/70   Pulse: 66 70 60 64   Resp: 17 19 19 18    Temp:       TempSrc:       SpO2: 98% 98% 96% 96%   Weight:       Height:           Physical Exam:   Constitutional: Patient is oriented to person, place, and time. Patient appears well-developed and well-nourished.  Obese  Head: Normocephalic and atraumatic.  Eyes- Sclera  anicteric  Neck: Normal range of motion. Neck supple. No JVD present. No bruit or thrill noted on bilateral carotids. No tracheal deviation present. No thyromegaly present.   Cardiovascular: Normal rate, regular rhythm, normal heart sounds and intact distal pulses.   Pulmonary/Chest: Effort normal and breath sounds normal. No stridor. No respiratory distress.  Abdominal: Soft. Hypoactive bowel sounds.  Patient exhibits no distension and no mass was palpable. No tenderness to palpation  Musculoskeletal: Normal range of motion. Patient exhibits no edema and no tenderness.    Neurological: Patient is alert and oriented to person, place, and time and has normal reflexes. No cranial nerve deficit.    Skin: Skin is warm. No rash noted. Patient is not diaphoretic.   Psychiatric: Has normal mood and affect. Behavior is normal. Judgment and thought content normal.    Physical Exam  Clinical Information and History:   Chief Complaint:  Chief Complaint   Patient presents with    Post-op Problem     Past Medical History:  Past Medical History:   Diagnosis Date    Anxiety     Asthma     allergic reaction to mold, not since 20 years    Depression     Diabetes     Hyperlipemia     Hypertensive disorder     Hypokalemia     Neuropathy     Stroke 2012    TIA (transient ischemic attack)      Past Surgical History:  Past Surgical History:   Procedure Laterality Date    HYSTERECTOMY      REDUCTION MAMMAPLASTY      TONSILLECTOMY      urinary bladder       Family History:  Family History   Problem Relation Age of Onset    Diabetes Mother     Heart attack Mother     Diabetes Maternal Grandmother     Stroke Maternal Grandmother     Breast cancer Daughter     Stroke Maternal Grandfather      Social History:  Social History     Substance and Sexual Activity   Alcohol Use No     Social History     Substance and Sexual Activity   Drug Use No  Social History     Tobacco Use   Smoking Status Never Smoker   Smokeless  Tobacco Never Used     Social History     Socioeconomic History    Marital status: Married     Spouse name: None    Number of children: None    Years of education: None    Highest education level: None   Occupational History    None   Social Engineer, site strain: None    Food insecurity     Worry: None     Inability: None    Transportation needs     Medical: None     Non-medical: None   Tobacco Use    Smoking status: Never Smoker    Smokeless tobacco: Never Used   Substance and Sexual Activity    Alcohol use: No    Drug use: No    Sexual activity: Not Currently     Comment: married, not working ,   Lifestyle    Physical activity     Days per week: None     Minutes per session: None    Stress: None   Relationships    Social Wellsite geologist on phone: None     Gets together: None     Attends religious service: None     Active member of club or organization: None     Attends meetings of clubs or organizations: None     Relationship status: None    Intimate partner violence     Fear of current or ex partner: None     Emotionally abused: None     Physically abused: None     Forced sexual activity: None   Other Topics Concern    None   Social History Narrative    None     Allergies:  Allergies   Allergen Reactions    Codeine Nausea And Vomiting    Crestor [Rosuvastatin] Rash     D/c due to severe rash and higher blood sugars    Glucosamine Hives and Nausea And Vomiting    Latex Rash    Eggs Or Egg-Derived Products      Patient had an allergy test and stated that she was allergic to eggs    Gabapentin      Dizziness and rash, irregular heart rate    Invokana [Canagliflozin]     Levemir [Insulin Detemir] Nausea And Vomiting    Lyrica [Pregabalin]      PT STATES IT MAKES HER NEUROPATHY WORSE STATES SHE FEELS LIKE SHE IS ON FIRE    Metformin      diarrhea    Evaristo Bury [Insulin Degludec]     Victoza [Liraglutide] Nausea And Vomiting    Flaxseed [Linseed Oil] Rash    Omega 3  [Fish Oil] Rash    Simvastatin Rash     Patients reports able to tolerate medication and is currently taking      Medications:(Not in a hospital admission)    Results of Labs/imaging   Labs have been reviewed:   Coagulation Profile:       CBC review:   Recent Labs   Lab 10/09/19  1043   WBC 8.47   Hgb 12.2   Hematocrit 38.1   Platelets 96*   MCV 88.4   RDW 14   Neutrophils 71.5   Lymphocytes Automated 22.0   Eosinophils Automated 0.4   Immature Granulocytes 0.4   Neutrophils Absolute  6.07   Immature Granulocytes Absolute 0.03     Chem Review:  Recent Labs   Lab 10/09/19  1043   Sodium 137   Potassium 5.9*   Chloride 103   CO2 22   BUN 15.6   Creatinine 0.8   Glucose 125*   Calcium 8.2*   Bilirubin, Total 0.4   AST (SGOT) 30   ALT 17   Alkaline Phosphatase 81     Results     Procedure Component Value Units Date/Time    Glucose Whole Blood - POCT [[id number]]  (Abnormal) Collected: 10/09/19 1444     Updated: 10/09/19 1446     Whole Blood Glucose POCT 105 mg/dL     Basic Metabolic Panel [[id number]] Collected: 10/09/19 1418    Specimen: Blood Updated: 10/09/19 1436    Cell MorpHology [[id number]]  (Abnormal) Collected: 10/09/19 1043     Updated: 10/09/19 1123     Cell Morphology Abnormal     Platelet Estimate Decreased     Ovalocytes Present    CBC and differential [[id number]]  (Abnormal) Collected: 10/09/19 1043    Specimen: Blood Updated: 10/09/19 1122     WBC 8.47 x10 3/uL      Hgb 12.2 g/dL      Hematocrit 62.9 %      Platelets 96 x10 3/uL      RBC 4.31 x10 6/uL      MCV 88.4 fL      MCH 28.3 pg      MCHC 32.0 g/dL      RDW 14 %      MPV 13.6 fL      Neutrophils 71.5 %      Lymphocytes Automated 22.0 %      Monocytes 5.3 %      Eosinophils Automated 0.4 %      Basophils Automated 0.4 %      Immature Granulocytes 0.4 %      Nucleated RBC 0.0 /100 WBC      Neutrophils Absolute 6.07 x10 3/uL      Lymphocytes Absolute Automated 1.86 x10 3/uL      Monocytes Absolute Automated 0.45 x10 3/uL      Eosinophils Absolute  Automated 0.03 x10 3/uL      Basophils Absolute Automated 0.03 x10 3/uL      Immature Granulocytes Absolute 0.03 x10 3/uL      Absolute NRBC 0.00 x10 3/uL     Comprehensive metabolic panel [[id number]]  (Abnormal) Collected: 10/09/19 1043    Specimen: Blood Updated: 10/09/19 1110     Glucose 125 mg/dL      BUN 01.0 mg/dL      Creatinine 0.8 mg/dL      Sodium 272 mEq/L      Potassium 5.9 mEq/L      Chloride 103 mEq/L      CO2 22 mEq/L      Calcium 8.2 mg/dL      Protein, Total 7.7 g/dL      Albumin 3.9 g/dL      AST (SGOT) 30 U/L      ALT 17 U/L      Alkaline Phosphatase 81 U/L      Bilirubin, Total 0.4 mg/dL      Globulin 3.8 g/dL      Albumin/Globulin Ratio 1.0     Anion Gap 12.0    GFR [[id number]] Collected: 10/09/19 1043     Updated: 10/09/19 1110     EGFR >60.0  Radiology reports have been reviewed:  Radiology Results (24 Hour)     Procedure Component Value Units Date/Time    Chest AP Portable [[id number]] Collected: 10/09/19 1343    Order Status: Completed Updated: 10/09/19 1346    Narrative:      History: Postoperative.    Comparison 07/01/2019    TECHNIQUE: AP portable upright    FINDINGS: Prominent soft tissue artifact in this patient. Low volume  film with vascular distention crowding of vessels. No consolidation  effusion or pneumothorax. Heart and mediastinal contours prominent to  the lower lung volumes. Bones and soft tissues unchanged.      Impression:      . Low-volume film with vascular distention. No consolidation    Marty Heck, MD   10/09/2019 1:44 PM    CT Abd/ Pelvis with IV Contrast [[id number]] Collected: 10/09/19 1207    Order Status: Completed Updated: 10/09/19 1221    Narrative:      Indication: Possible perforation during colonoscopy.    Technique: Contrast-enhanced CT of the abdomen and pelvis. 100 mm of  Omnipaque 350 were administered intravenously. No oral contrast  material.    Comparison: MR of the abdomen September 29. CT of the abdomen and pelvis  September 14.    Findings:  Clips are seen in the sigmoid colon, presumably from  endoscopic biopsy. There are innumerable tiny bubbles of extraluminal  gas in the retroperitoneum, mostly on the right. The site of origin is  unclear. No colonic wall thickening is seen. No abscess. No free  intraperitoneal gas or free fluid. No dilated bowel loops.  Diverticulosis of the colon is mild.    A subcentimeter lesion of the left hepatic lobe is unchanged from prior  exam and likely to be a tiny benign cyst or hemangioma. Unremarkable  gallbladder. No dilatation of the biliary tree. Unremarkable spleen.  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 Unremarkable adrenals and kidneys. There is a  small amount of calcified atherosclerotic plaque. No aneurysm of the  abdominal aorta. There is a chronic collection in the subcutaneous  changes fatty tissue of the anterior abdominal wall centered to the  right of midline below the umbilicus measuring approximately 6 cm in  diameter 1.5 cm in thickness, likely a consequence of prior abdominal  surgery. There is some stranding of the subcutaneous fatty tissue that  is possibly from liposuction. The uterus is absent. No adnexal masses or  cystic lesions are seen. Included portions of the lungs and mediastinum  are unremarkable.      Impression:        1. There are innumerable small collections of extra luminal gas in the  retroperitoneum, presumably arising from a colonic perforation. The site  of the perforation is not clear.  2. Clips are seen in the sigmoid colon from endoscopic biopsy.  3. Additional chronic findings detailed above.    Urgent findings were reported to and acknowledged by PA Adelene Idler at 1215  hours.    Wynema Birch, MD   10/09/2019 12:19 PM        EKG: EKG reviewed from 05/27/2019  NSR with HR 64, nonspecific t wave abnormality stable   Hospitalist   Signed by:   Arville Care Mallorey Odonell  10/09/2019 2:48 PM    *This note was generated by the  [employer] system/ Dragon speech recognition and may contain inherent errors or omissions not intended by the user. Grammatical errors, random  word insertions, deletions, pronoun errors and incomplete sentences are occasional consequences of this technology due to software limitations. Not all errors are caught or corrected. If there are questions or concerns about the content of this note or information contained within the body of this dictation they should be addressed directly with the author for clarification

## 2019-10-09 NOTE — ED Provider Notes (Signed)
History     Chief Complaint   Patient presents with    Post-op Problem     Patient presents the emergency department down from endoscopy suite.  Notified by surgeon, Dr. Rhetta Mura, there is concern about a possible perforation.  Patient had placed a few clips and then stopped the procedure out of concern for perforation.  Patient denies any pain.  There was no reported excessive bleeding.  Patient denies any lightheadedness or dizziness.  Patient underwent typical preop clearance, cardiac clearance, had a negative Covid test.      The history is provided by the patient (RN/MD ). No language interpreter was used.        Past Medical History:   Diagnosis Date    Anxiety     Asthma     allergic reaction to mold, not since 20 years    Depression     Diabetes     Hyperlipemia     Hypertensive disorder     Hypokalemia     Neuropathy     Stroke 2012    TIA (transient ischemic attack)        Past Surgical History:   Procedure Laterality Date    HYSTERECTOMY      REDUCTION MAMMAPLASTY      TONSILLECTOMY      urinary bladder         Family History   Problem Relation Age of Onset    Diabetes Mother     Heart attack Mother     Diabetes Maternal Grandmother     Stroke Maternal Grandmother     Breast cancer Daughter     Stroke Maternal Grandfather        Social  Social History     Tobacco Use    Smoking status: Never Smoker    Smokeless tobacco: Never Used   Substance Use Topics    Alcohol use: No    Drug use: No       .     Allergies   Allergen Reactions    Codeine Nausea And Vomiting    Crestor [Rosuvastatin] Rash     D/c due to severe rash and higher blood sugars    Glucosamine Hives and Nausea And Vomiting    Latex Rash    Eggs Or Egg-Derived Products      Patient had an allergy test and stated that she was allergic to eggs    Gabapentin      Dizziness and rash, irregular heart rate    Invokana [Canagliflozin]     Levemir [Insulin Detemir] Nausea And Vomiting    Lyrica [Pregabalin]      PT  STATES IT MAKES HER NEUROPATHY WORSE STATES SHE FEELS LIKE SHE IS ON FIRE    Metformin      diarrhea    Evaristo Bury [Insulin Degludec]     Victoza [Liraglutide] Nausea And Vomiting    Flaxseed [Linseed Oil] Rash    Omega 3 [Fish Oil] Rash    Simvastatin Rash     Patients reports able to tolerate medication and is currently taking        Home Medications     Med List Status: In Progress Set By: Glenford Bayley, Delia Heady, RN at 10/09/2019 10:07 AM                aspirin 81 MG chewable tablet     Chew 162 mg by mouth daily     glipiZIDE (GLUCOTROL) 10 MG tablet  Take 1 tablet (10 mg total) by mouth every morning with breakfast     glucose blood test strip     Check sugar three times a day, ICD 10 E11.9     insulin glargine (LANTUS SOLOSTAR) 100 UNIT/ML injection pen     Inject 10 units into skin daily at bedtime     Patient taking differently: Inject 10 units into skin daily at bedtime STATES ONLY TAKES IF NEEDED       linaGLIPtin (Tradjenta) 5 MG Tab     Take 1 tablet (5 mg total) by mouth daily     LORazepam (ATIVAN) 0.5 MG tablet     Take 0.5 tablets (0.25 mg total) by mouth nightly as needed for Anxiety     losartan (COZAAR) 50 MG tablet     Take 1 tablet (50 mg total) by mouth nightly     simvastatin (ZOCOR) 10 MG tablet     Take 10 mg by mouth every other day        verapamil ER (CALAN-SR) 120 MG CR tablet     Take 1 tablet (120 mg total) by mouth daily     Patient taking differently: Take 120 mg by mouth daily STATES SHE TAKES 2 DAILY ON SOME DAYS             Review of Systems   Constitutional: Negative.    HENT: Negative.    Respiratory: Negative.    Cardiovascular: Negative.    Gastrointestinal: Negative.    Genitourinary: Negative.    Musculoskeletal: Negative.    Skin: Negative.    Neurological: Positive for dizziness.   Hematological: Negative.    Psychiatric/Behavioral: Negative.    All other systems reviewed and are negative.      Physical Exam    BP: 157/76, Heart Rate: 68, Temp: 97.6 F (36.4 C),  Resp Rate: 16, SpO2: 100 %, Weight: 93 kg    Physical Exam  Vitals signs reviewed.   Constitutional:       Appearance: Normal appearance. She is not ill-appearing, toxic-appearing or diaphoretic.      Interventions: Face mask in place.   HENT:      Head: Normocephalic.   Neck:      Musculoskeletal: Full passive range of motion without pain, normal range of motion and neck supple.   Cardiovascular:      Rate and Rhythm: Normal rate.      Pulses: Normal pulses.      Heart sounds: Normal heart sounds, S1 normal and S2 normal.   Pulmonary:      Effort: Pulmonary effort is normal.      Breath sounds: Normal breath sounds and air entry. No decreased breath sounds, wheezing, rhonchi or rales.   Abdominal:      General: Abdomen is flat. There is no distension.      Palpations: There is no mass.      Tenderness: There is no abdominal tenderness. There is no right CVA tenderness, left CVA tenderness, guarding or rebound.      Hernia: No hernia is present.   Musculoskeletal:      Cervical back: Normal.      Thoracic back: Normal.      Lumbar back: Normal.   Skin:     General: Skin is warm.   Neurological:      General: No focal deficit present.      Mental Status: She is alert.      Cranial Nerves: Cranial nerves are intact.  Sensory: Sensation is intact.      Motor: Motor function is intact.      Coordination: Coordination is intact.           MDM and ED Course     ED Medication Orders (From admission, onward)    Start Ordered     Status Ordering Provider    10/09/19 1300 10/09/19 1259  sodium chloride 0.9 % bolus 500 mL  Once     Route: Intravenous  Ordered Dose: 500 mL     Ordered Lequan Dobratz EDWIN    10/09/19 1214 10/09/19 1213  ciprofloxacin (CIPRO) 400mg  in D5W IVPB (premix)  Every 12 hours     Route: Intravenous  Ordered Dose: 400 mg     Last MAR action: New Bag Zelda Reames EDWIN    10/09/19 1214 10/09/19 1213  metroNIDAZOLE (FLAGYL) 500 mg IVPB (premix)  Once     Route: Intravenous  Ordered Dose: 500 mg      Acknowledged Square Jowett EDWIN    10/09/19 1149 10/09/19 1149  iohexol (OMNIPAQUE) 350 MG/ML injection 100 mL  IMG once as needed     Route: Intravenous  Ordered Dose: 100 mL     Last MAR action: Imaging Agent Given Drue Second, [facility]    10/09/19 1007 10/09/19 1006  sodium chloride 0.9 % bolus 500 mL  Once     Route: Intravenous  Ordered Dose: 500 mL     Last MAR action: Stopped Treniyah Lynn EDWIN             MDM  Number of Diagnoses or Management Options  Perforated abdominal viscus: new and requires workup  Diagnosis management comments:   I, Adelene Idler, Physician Assistant, have been the primary provider for this pt during their ED stay.     The attending signature signifies review and agreement of the history, physical examination, evaluation, clinical impression and plan except as noted.     02 sat is 99% on  ra, which is nml.     Patient sent down from GI with clinical concern about perforated viscus.  Patient is awake, mentating well, denies pain, stable vitals.    Check CT scan, screening labs.  Spoke with patient's gastroenterologist Dr. [name], will consult.    Lab/Rad results reviewed include:     Labs Reviewed  CBC AND DIFFERENTIAL - Abnormal; Notable for the following components:     Platelets                     96 (*)                 MPV                           13.6 (*)            All other components within normal limits  COMPREHENSIVE METABOLIC PANEL - Abnormal; Notable for the following components:     Glucose                       125 (*)                Potassium                     5.9 (*)                Calcium  8.2 (*)                Globulin                      3.8 (*)             All other components within normal limits  CELL MORPHOLOGY - Abnormal; Notable for the following components:     Cell Morphology               Abnormal (*)               Platelet Estimate             Decreased (*)               Ovalocytes                    Present (*)            All other components  within normal limits  GFR  BASIC METABOLIC PANEL  GFR        CT Abd/ Pelvis with IV Contrast   Final Result        1. There are innumerable small collections of extra luminal gas in the    retroperitoneum, presumably arising from a colonic perforation. The site    of the perforation is not clear.    2. Clips are seen in the sigmoid colon from endoscopic biopsy.    3. Additional chronic findings detailed above.        Urgent findings were reported to and acknowledged by PA Adelene Idler at 1215    hours.        Wynema Birch, MD     10/09/2019 12:19 PM     Spoke with PA for Surgical Service.  They will admit the patient under Dr [name].  Request IV fluids, antibiotics, n.p.o.    Spoke with hospitalist service Sandra Cockayne PA who will consult.    Again pt seen by Dr [name], will consult.    On reassessment, patient remains well-appearing, normal vital signs, nonsurgical/nontender abdominal exam.    *This note was generated by the Epic EMR system/ Dragon speech recognition and may contain inherent errors or omissions not intended by the user. Grammatical errors, random word insertions, deletions, pronoun errors and incomplete sentences are occasional consequences of this technology due to software limitations. Not all errors are caught or corrected. If there are questions or concerns about the content of this note or information contained within the body of this dictation they should be addressed directly with the author for clarification         Amount and/or Complexity of Data Reviewed  Clinical lab tests: reviewed and ordered  Tests in the radiology section of CPT: ordered and reviewed  Review and summarize past medical records: yes  Discuss the patient with other providers: yes    Risk of Complications, Morbidity, and/or Mortality  Presenting problems: high  Diagnostic procedures: high  Management options: high    Patient Progress  Patient progress: stable                   Procedures    Clinical Impression & Disposition      Clinical Impression  Final diagnoses:   Perforated abdominal viscus        ED Disposition     ED Disposition Condition Date/Time Comment    Admit  Wed Oct 09, 2019 12:13 PM  New Prescriptions    No medications on file                 Jamse Arn, PA  10/09/19 1351       Jamse Arn, [place]  10/09/19 1352       Helayne Seminole, MD  10/09/19 [phone]

## 2019-10-09 NOTE — ED Notes (Signed)
Blood Glucose 105

## 2019-10-09 NOTE — Progress Notes (Signed)
Pt admitted to room 4103 from [facility]. Pt A/Ox4, transferred from stretcher to bed via sliding sheet. Pt IV fluids and IV antibiotics running when arrived. Admission assessment completed and charted. Pt oriented to unit and call bell. Call bell placed within reach. Will continue to monitor.

## 2019-10-09 NOTE — ED Triage Notes (Signed)
Sent over after colonoscopy for possible colon perf. Pt speaking in full/complete sentences. Denies pain at this time, nausea, vomiting, diarrhea, fevers, cough, SOB. VSS.

## 2019-10-09 NOTE — UM Notes (Signed)
[facility] Utilization Review   NPI [id number], Tax ID [id number]  Fax final authorization and requests for additional information to [phone]    10/09/19      History of present illness: Pt is a 68 y.o. female who arrived to the ER (10/09/2019 at 0955) with C/OPost-op Problem    DIAGNOSIS:     ICD-10-CM    1. Perforated abdominal viscus  R19.8        PATIENT NAME: Emily Rowe,Emily Rowe / 1951-08-19 / AGE: 68 y.o.  MRN#: [id number]  CSN#: [id number]   PAYOR:Payor: MEDICARE / Plan: MEDICARE PART A AND B / Product Type: Medicare /        PMH:  has a past medical history of Anxiety, Asthma, Depression, Diabetes, Hyperlipemia, Hypertensive disorder, Hypokalemia, Neuropathy, Stroke (2012), and TIA (transient ischemic attack).  PSH:  has a past surgical history that includes Hysterectomy; Tonsillectomy; Reduction mammaplasty; and urinary bladder.    ED TREATMENT  on 10/09/2019  9:55 AM    V/S:  t 97.6, hr 72, rr 24, 157/76    Labs - play 96, glucose 125, k 5.9, ca 8.2, globulin 3.8    Radiologic Studies -   Ct Abd/ Pelvis With Iv Contrast    Result Date: 10/09/2019  1. There are innumerable small collections of extra luminal gas in the retroperitoneum, presumably arising from a colonic perforation. The site of the perforation is not clear. 2. Clips are seen in the sigmoid colon from endoscopic biopsy. 3. Additional chronic findings detailed above. Urgent findings were reported to and acknowledged by PA Adelene Idler at 1215 hours. Wynema Birch, MD  10/09/2019 12:19 PM    Chest Ap Portable    Result Date: 10/09/2019  . Low-volume film with vascular distention. No consolidation Marty Heck, MD  10/09/2019 1:44 PM      ED meds:   NS bolus  cipro 400mg  IV  Flagyl 500mg  IV  Hydralazine 10mg  IV

## 2019-10-09 NOTE — ED Notes (Signed)
[facility] [place]  ED NURSING NOTE FOR THE RECEIVING INPATIENT NURSE   ED NURSE Bea Graff [phone]   ED CHARGE RN Florentina Addison   ADMISSION INFORMATION   Emily Rowe Xanthe Stoots is a 68 y.o. female admitted with an ED diagnosis of:    1. Perforated abdominal viscus         Isolation: None   Allergies: Codeine, Crestor [rosuvastatin], Glucosamine, Latex, Eggs or egg-derived products, Gabapentin, Invokana [canagliflozin], Levemir [insulin detemir], Lyrica [pregabalin], Metformin, Evaristo Bury [insulin degludec], Victoza [liraglutide], Flaxseed [linseed oil], Omega 3 [fish oil], and Simvastatin   Holding Orders confirmed? Yes   Belongings Documented? No   Home medications sent to pharmacy confirmed? No   NURSING CARE   Patient Comes From:   Mental Status: Home Independent  alert and oriented   ADL: Independent with all ADLs   Ambulation: Uses walker at home   Pertinent Information  and Safety Concerns: possible perforated colon      CT / NIH   CT Head ordered on this patient?  No   NIH/Dysphagia assessment done prior to admission? No   VITAL SIGNS (at the time of this note)      Vitals:    10/09/19 1430   BP: 159/70   Pulse: 64   Resp: 18   Temp:    SpO2: 96%

## 2019-10-09 NOTE — H&P (Signed)
HISTORY AND PHYSICAL  [facility] [phone]      Admission Date: 10/09/19  Patient Name: Emily [facility]  Attending Physician: Talbert Nan, MD  Admitting Service:  General Surgery    Reason for Admission: Abdominal Pain    History of Present Illness:   Emily [facility]e is a 68 y.o. female who  has a past medical history of Anxiety, Asthma, Depression, Diabetes, Hyperlipemia, Hypertensive disorder, Hypokalemia, Neuropathy, Stroke (2012), and TIA (transient ischemic attack). She presents to the [facility] per direction of her Gastroenterologist Dr. [name] following initiation of colonoscopy and noted colonic perforation via diverticulum with endoscopic clip placement earlier this afternoon. She denies any abdominal pain, nausea, vomiting, fever, chills.       Past Medical History:     Past Medical History:   Diagnosis Date    Anxiety     Asthma     allergic reaction to mold, not since 20 years    Depression     Diabetes     Hyperlipemia     Hypertensive disorder     Hypokalemia     Neuropathy     Stroke 2012    TIA (transient ischemic attack)        Past Surgical History:     Past Surgical History:   Procedure Laterality Date    HYSTERECTOMY      REDUCTION MAMMAPLASTY      TONSILLECTOMY      urinary bladder         Family History:     Family History   Problem Relation Age of Onset    Diabetes Mother     Heart attack Mother     Diabetes Maternal Grandmother     Stroke Maternal Grandmother     Breast cancer Daughter     Stroke Maternal Grandfather        Social History:     Social History     Socioeconomic History    Marital status: Married     Spouse name: Not on file    Number of children: Not on file    Years of education: Not on file    Highest education level: Not on file   Occupational History    Not on file   Social Needs    Financial resource strain: Not on file    Food insecurity     Worry: Not on file     Inability: Not on file    Transportation needs      Medical: Not on file     Non-medical: Not on file   Tobacco Use    Smoking status: Never Smoker    Smokeless tobacco: Never Used   Substance and Sexual Activity    Alcohol use: No    Drug use: No    Sexual activity: Not Currently     Comment: married, not working ,   Lifestyle    Physical activity     Days per week: Not on file     Minutes per session: Not on file    Stress: Not on file   Relationships    Social connections     Talks on phone: Not on file     Gets together: Not on file     Attends religious service: Not on file     Active member of club or organization: Not on file     Attends meetings of clubs or organizations: Not on file     Relationship status: Not  on file    Intimate partner violence     Fear of current or ex partner: Not on file     Emotionally abused: Not on file     Physically abused: Not on file     Forced sexual activity: Not on file   Other Topics Concern    Not on file   Social History Narrative    Not on file       Allergies:     Allergies   Allergen Reactions    Codeine Nausea And Vomiting    Crestor [Rosuvastatin] Rash     D/c due to severe rash and higher blood sugars    Glucosamine Hives and Nausea And Vomiting    Latex Rash    Eggs Or Egg-Derived Products      Patient had an allergy test and stated that she was allergic to eggs    Gabapentin      Dizziness and rash, irregular heart rate    Invokana [Canagliflozin]     Levemir [Insulin Detemir] Nausea And Vomiting    Lyrica [Pregabalin]      PT STATES IT MAKES HER NEUROPATHY WORSE STATES SHE FEELS LIKE SHE IS ON FIRE    Metformin      diarrhea    Evaristo Bury [Insulin Degludec]     Victoza [Liraglutide] Nausea And Vomiting    Flaxseed [Linseed Oil] Rash    Omega 3 [Fish Oil] Rash    Simvastatin Rash     Patients reports able to tolerate medication and is currently taking        Medications:     Prior to Admission medications    Medication Sig Start Date End Date Taking? Authorizing Provider   glipiZIDE  (GLUCOTROL) 10 MG tablet Take 1 tablet (10 mg total) by mouth every morning with breakfast 08/21/19  Yes Gundu Darcus Austin, MD   insulin glargine (LANTUS SOLOSTAR) 100 UNIT/ML injection pen Inject 10 units into skin daily at bedtime  Patient taking differently: Inject 10 units into skin daily at bedtime STATES ONLY TAKES IF NEEDED   02/25/19  Yes Tanya Nones, MD   linaGLIPtin (Tradjenta) 5 MG Tab Take 1 tablet (5 mg total) by mouth daily 07/22/19  Yes Tanya Nones, MD   losartan (COZAAR) 50 MG tablet Take 1 tablet (50 mg total) by mouth nightly  Patient taking differently: Take 50 mg by mouth 2 (two) times daily    08/13/19  Yes Anbarasan, Malarmathi Antonieta Pert, MD   simvastatin (ZOCOR) 10 MG tablet Take 10 mg by mouth every other day      Yes [provider]   verapamil ER (CALAN-SR) 120 MG CR tablet Take 1 tablet (120 mg total) by mouth daily  Patient taking differently: Take 120 mg by mouth 2 (two) times daily STATES SHE TAKES 2 DAILY ON SOME DAYS   08/13/19  Yes Anbarasan, Lovell Sheehan, MD   aspirin 81 MG chewable tablet Chew 162 mg by mouth daily    [provider]   glucose blood test strip Check sugar three times a day, ICD 10 E11.9 08/13/19   Anbarasan, Lovell Sheehan, MD   LORazepam (ATIVAN) 0.5 MG tablet Take 0.5 tablets (0.25 mg total) by mouth nightly as needed for Anxiety 09/05/19   Anbarasan, Lovell Sheehan, MD       Review of Systems:   As per the HPI.  The patient denies any additional changes to their otic, opthalmologic, dermatologic, pulmonary, cardiac, gastrointestinal, genitourinary, musculoskeletal, hematologic,  constitutional, or psychiatric systems.    Physical Exam:     Vitals:    10/09/19 1630   BP: 135/60   Pulse: 70   Resp: 12   Temp:    SpO2: 98%       General appearance - alert, well appearing, and in no distress  Mental status - alert, oriented to person, place, and time  Eyes - sclera anicteric, left eye normal, right eye normal   Mouth - mucous membrane moist/pink  Chest - no tachypnea, retractions or cyanosis, breathing comfortably on room air, audibly clear respirations  Heart - normal rate and regular rhythm  Abdomen - soft, nontender, nondistended, no masses or organomegaly  Extremities - no pedal edema noted, warm, well perfused, no rebound tenderness or guarding  Skin - normal color, warm, dry, intact    Labs:     Results     Procedure Component Value Units Date/Time    GFR [[id number]] Collected: 10/09/19 1418     Updated: 10/09/19 1458     EGFR >60.0    Basic Metabolic Panel [[id number]]  (Abnormal) Collected: 10/09/19 1418    Specimen: Blood Updated: 10/09/19 1458     Glucose 143 mg/dL      BUN 30.8 mg/dL      Creatinine 0.9 mg/dL      Calcium 8.8 mg/dL      Sodium 657 mEq/L      Potassium 4.4 mEq/L      Chloride 100 mEq/L      CO2 26 mEq/L      Anion Gap 14.0    Glucose Whole Blood - POCT [[id number]]  (Abnormal) Collected: 10/09/19 1444     Updated: 10/09/19 1446     Whole Blood Glucose POCT 105 mg/dL     Cell MorpHology [[id number]]  (Abnormal) Collected: 10/09/19 1043     Updated: 10/09/19 1123     Cell Morphology Abnormal     Platelet Estimate Decreased     Ovalocytes Present    CBC and differential [[id number]]  (Abnormal) Collected: 10/09/19 1043    Specimen: Blood Updated: 10/09/19 1122     WBC 8.47 x10 3/uL      Hgb 12.2 g/dL      Hematocrit 40.3 %      Platelets 96 x10 3/uL      RBC 4.31 x10 6/uL      MCV 88.4 fL      MCH 28.3 pg      MCHC 32.0 g/dL      RDW 14 %      MPV 13.6 fL      Neutrophils 71.5 %      Lymphocytes Automated 22.0 %      Monocytes 5.3 %      Eosinophils Automated 0.4 %      Basophils Automated 0.4 %      Immature Granulocytes 0.4 %      Nucleated RBC 0.0 /100 WBC      Neutrophils Absolute 6.07 x10 3/uL      Lymphocytes Absolute Automated 1.86 x10 3/uL      Monocytes Absolute Automated 0.45 x10 3/uL      Eosinophils Absolute Automated 0.03 x10 3/uL      Basophils Absolute Automated 0.03 x10 3/uL       Immature Granulocytes Absolute 0.03 x10 3/uL      Absolute NRBC 0.00 x10 3/uL     Comprehensive metabolic panel [[id number]]  (Abnormal) Collected: 10/09/19 1043    Specimen: Blood Updated:  10/09/19 1110     Glucose 125 mg/dL      BUN 16.1 mg/dL      Creatinine 0.8 mg/dL      Sodium 096 mEq/L      Potassium 5.9 mEq/L      Chloride 103 mEq/L      CO2 22 mEq/L      Calcium 8.2 mg/dL      Protein, Total 7.7 g/dL      Albumin 3.9 g/dL      AST (SGOT) 30 U/L      ALT 17 U/L      Alkaline Phosphatase 81 U/L      Bilirubin, Total 0.4 mg/dL      Globulin 3.8 g/dL      Albumin/Globulin Ratio 1.0     Anion Gap 12.0    GFR [[id number]] Collected: 10/09/19 1043     Updated: 10/09/19 1110     EGFR >60.0          Rads:     Radiology Results (24 Hour)     Procedure Component Value Units Date/Time    Chest AP Portable [[id number]] Collected: 10/09/19 1343    Order Status: Completed Updated: 10/09/19 1346    Narrative:      History: Postoperative.    Comparison 07/01/2019    TECHNIQUE: AP portable upright    FINDINGS: Prominent soft tissue artifact in this patient. Low volume  film with vascular distention crowding of vessels. No consolidation  effusion or pneumothorax. Heart and mediastinal contours prominent to  the lower lung volumes. Bones and soft tissues unchanged.      Impression:      . Low-volume film with vascular distention. No consolidation    Marty Heck, MD   10/09/2019 1:44 PM    CT Abd/ Pelvis with IV Contrast [[id number]] Collected: 10/09/19 1207    Order Status: Completed Updated: 10/09/19 1221    Narrative:      Indication: Possible perforation during colonoscopy.    Technique: Contrast-enhanced CT of the abdomen and pelvis. 100 mm of  Omnipaque 350 were administered intravenously. No oral contrast  material.    Comparison: MR of the abdomen September 29. CT of the abdomen and pelvis  September 14.    Findings: Clips are seen in the sigmoid colon, presumably from  endoscopic biopsy. There are innumerable tiny  bubbles of extraluminal  gas in the retroperitoneum, mostly on the right. The site of origin is  unclear. No colonic wall thickening is seen. No abscess. No free  intraperitoneal gas or free fluid. No dilated bowel loops.  Diverticulosis of the colon is mild.    A subcentimeter lesion of the left hepatic lobe is unchanged from prior  exam and likely to be a tiny benign cyst or hemangioma. Unremarkable  gallbladder. No dilatation of the biliary tree. Unremarkable spleen.  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 Unremarkable adrenals and kidneys. There is a  small amount of calcified atherosclerotic plaque. No aneurysm of the  abdominal aorta. There is a chronic collection in the subcutaneous  changes fatty tissue of the anterior abdominal wall centered to the  right of midline below the umbilicus measuring approximately 6 cm in  diameter 1.5 cm in thickness, likely a consequence of prior abdominal  surgery. There is some stranding of the subcutaneous fatty tissue that  is possibly from liposuction. The uterus is absent. No adnexal masses or  cystic lesions are seen.  Included portions of the lungs and mediastinum  are unremarkable.      Impression:        1. There are innumerable small collections of extra luminal gas in the  retroperitoneum, presumably arising from a colonic perforation. The site  of the perforation is not clear.  2. Clips are seen in the sigmoid colon from endoscopic biopsy.  3. Additional chronic findings detailed above.    Urgent findings were reported to and acknowledged by PA Adelene Idler at 1215  hours.    Wynema Birch, MD   10/09/2019 12:19 PM          Impression:     Patient Active Problem List   Diagnosis    Uncontrolled type 2 diabetes mellitus with hyperglycemia    Mixed hyperlipidemia    Long-term insulin use    Obesity (BMI 30-39.9)    Neuropathy    Stroke    Essential hypertension    Diabetic peripheral neuropathy     History of hemorrhagic stroke with residual hemiparesis    Type 2 diabetes mellitus treated with insulin    COVID-19 virus infection    Pancreas cyst    Breast pain, left    Perforation of sigmoid colon    Perforated abdominal viscus     68 yo f with h/o CVA, HTN, HLD, DM, asthma, anxiety    - S/p colonoscopy evaluation with incidental perforation of sigmoid diverticulum & clip placement  - CT AP demonstrating: small collections of extraluminal gas in retroperitoneum, clips noted in sigmoid colon. Expected findings given above    Clinically stable, without any abdominal pain, nausea or vomiting. Abdomen non-tender without guarding or rebound tenderness. No leukocytosis. CT images personally reviewed.   Plan:   - admit to observation  - NPO for now  - IV abx & fluconazole per Dr. [name], assistance appreciated  - repeat labs tomorrow  - monitor for now  - No indication for acute surgical intervention at this point  - serial abdominal exams  - patient seen with Dr. [name]      Signed by: Ruel Favors

## 2019-10-09 NOTE — ED Notes (Signed)
Contacted dr Faythe Casa for patient pain and nausea medication, responded "ok", waiting for medication orders

## 2019-10-10 ENCOUNTER — Encounter (INDEPENDENT_AMBULATORY_CARE_PROVIDER_SITE_OTHER): Payer: Self-pay | Admitting: Family Medicine

## 2019-10-10 DIAGNOSIS — Z794 Long term (current) use of insulin: Secondary | ICD-10-CM

## 2019-10-10 DIAGNOSIS — E119 Type 2 diabetes mellitus without complications: Secondary | ICD-10-CM

## 2019-10-10 LAB — CBC AND DIFFERENTIAL
Absolute NRBC: 0 10*3/uL (ref 0.00–0.00)
Basophils Absolute Automated: 0.04 10*3/uL (ref 0.00–0.08)
Basophils Automated: 0.4 %
Eosinophils Absolute Automated: 0.01 10*3/uL (ref 0.00–0.44)
Eosinophils Automated: 0.1 %
Hematocrit: 36.2 % (ref 34.7–43.7)
Hgb: 11.4 g/dL (ref 11.4–14.8)
Immature Granulocytes Absolute: 0.04 10*3/uL (ref 0.00–0.07)
Immature Granulocytes: 0.4 %
Lymphocytes Absolute Automated: 1.81 10*3/uL (ref 0.42–3.22)
Lymphocytes Automated: 18.3 %
MCH: 27.7 pg (ref 25.1–33.5)
MCHC: 31.5 g/dL (ref 31.5–35.8)
MCV: 87.9 fL (ref 78.0–96.0)
MPV: 13.3 fL — ABNORMAL HIGH (ref 8.9–12.5)
Monocytes Absolute Automated: 0.54 10*3/uL (ref 0.21–0.85)
Monocytes: 5.4 %
Neutrophils Absolute: 7.47 10*3/uL — ABNORMAL HIGH (ref 1.10–6.33)
Neutrophils: 75.4 %
Nucleated RBC: 0 /100 WBC (ref 0.0–0.0)
Platelets: 143 10*3/uL (ref 142–346)
RBC: 4.12 10*6/uL (ref 3.90–5.10)
RDW: 14 % (ref 11–15)
WBC: 9.91 10*3/uL — ABNORMAL HIGH (ref 3.10–9.50)

## 2019-10-10 LAB — COMPREHENSIVE METABOLIC PANEL
ALT: 12 U/L (ref 0–55)
AST (SGOT): 12 U/L (ref 5–34)
Albumin/Globulin Ratio: 1.2 (ref 0.9–2.2)
Albumin: 3.3 g/dL — ABNORMAL LOW (ref 3.5–5.0)
Alkaline Phosphatase: 69 U/L (ref 37–106)
Anion Gap: 9 (ref 5.0–15.0)
BUN: 10 mg/dL (ref 7.0–19.0)
Bilirubin, Total: 0.4 mg/dL (ref 0.2–1.2)
CO2: 25 mEq/L (ref 22–29)
Calcium: 8.1 mg/dL — ABNORMAL LOW (ref 8.5–10.5)
Chloride: 107 mEq/L (ref 100–111)
Creatinine: 0.8 mg/dL (ref 0.6–1.0)
Globulin: 2.7 g/dL (ref 2.0–3.6)
Glucose: 95 mg/dL (ref 70–100)
Potassium: 3.7 mEq/L (ref 3.5–5.1)
Protein, Total: 6 g/dL (ref 6.0–8.3)
Sodium: 141 mEq/L (ref 136–145)

## 2019-10-10 LAB — GFR: EGFR: >60

## 2019-10-10 LAB — GLUCOSE WHOLE BLOOD - POCT
Whole Blood Glucose POCT: 109 mg/dL — ABNORMAL HIGH (ref 70–100)
Whole Blood Glucose POCT: 138 mg/dL — ABNORMAL HIGH (ref 70–100)
Whole Blood Glucose POCT: 136 mg/dL — ABNORMAL HIGH (ref 70–100)
Whole Blood Glucose POCT: 105 mg/dL — ABNORMAL HIGH (ref 70–100)

## 2019-10-10 LAB — HEMOGLOBIN A1C
Average Estimated Glucose: 159.9 mg/dL
Hemoglobin A1C: 7.2 % — ABNORMAL HIGH (ref 4.6–5.9)

## 2019-10-10 MED ORDER — LOSARTAN POTASSIUM 25 MG PO TABS
50.0000 mg | ORAL_TABLET | Freq: Two times a day (BID) | ORAL | Status: DC
Start: 2019-10-10 — End: 2019-10-11
  Administered 2019-10-10 – 2019-10-11 (×3): 50 mg via ORAL
  Filled 2019-10-10 (×3): qty 2

## 2019-10-10 MED ORDER — LOSARTAN POTASSIUM 25 MG PO TABS
50.0000 mg | ORAL_TABLET | Freq: Every evening | ORAL | Status: DC
Start: 2019-10-10 — End: 2019-10-10

## 2019-10-10 NOTE — Progress Notes (Signed)
Situation Post op abdominal perforation     Background Lives w/husband, patient is independent and driving. Does own cane.       Assessment aox3     Recommendation Home with no need        10/10/19 1532   Patient Type   Within 30 Days of Previous Admission? No   Healthcare Decisions   Interviewed: Patient   Name of interviewee if other than the pt: spoke with the patient.   Orientation/Decision Making Abilities of Patient Alert and Oriented x3, able to make decisions   Advance Directive Patient does not have advance directive   Prior to admission   Prior level of function Independent with ADLs;Ambulates independently   Home Layout Multi-level   Have running water, electricity, heat, etc? Yes   Living Arrangements Spouse/significant other   How do you get to your MD appointments? patient drives   Adult Management consultant (APS) involved? No   Discharge Planning   Anticipated [place] plan discussed with: Family   Potential barriers to discharge: Testing/procedure   Mode of transportation: Private car (family member)   Consults/Providers   Correct PCP listed in Epic? Yes   Important Message from [facility] Notice   Patient received 1st IMM Letter? No

## 2019-10-10 NOTE — UM Notes (Signed)
Medicare primary review:     [facility] Utilization Review   NPI [id number], Tax ID [id number]  Fax final authorization and requests for additional information to [phone]    Observation admission on 10/09/19 for perforated abd viscus.   Changed to inpatient on 10/10/19      History of present illness: Pt is a 68 y.o. female who arrived to the ER (10/09/2019 at 0955) with C/OPost-op Problem    DIAGNOSIS:     ICD-10-CM    1. Perforated abdominal viscus  R19.8        PATIENT NAME: Emily [facility]e,Emily Rowe / [birth year] / AGE: 68 y.o.  MRN#: [id number]  CSN#: [id number]    PAYOR:Payor: MEDICARE / Plan: MEDICARE PART A AND B / Product Type: Medicare /        PMH:  has a past medical history of Anxiety, Asthma, Depression, Diabetes, Hyperlipemia, Hypertensive disorder, Hypokalemia, Neuropathy, Stroke (2012), and TIA (transient ischemic attack).  PSH:  has a past surgical history that includes Hysterectomy; Tonsillectomy; Reduction mammaplasty; and urinary bladder.    ED TREATMENT  on 10/09/2019  9:55 AM    V/S:  t 97.6, hr 72, rr 24, 157/76    Labs - play 96, glucose 125, k 5.9, ca 8.2, globulin 3.8    Radiologic Studies -   Ct Abd/ Pelvis With Iv Contrast    Result Date: 10/09/2019  1. There are innumerable small collections of extra luminal gas in the retroperitoneum, presumably arising from a colonic perforation. The site of the perforation is not clear. 2. Clips are seen in the sigmoid colon from endoscopic biopsy. 3. Additional chronic findings detailed above. Urgent findings were reported to and acknowledged by PA Adelene Idler at 1215 hours. Wynema Birch, MD  10/09/2019 12:19 PM    Chest Ap Portable    Result Date: 10/09/2019  . Low-volume film with vascular distention. No consolidation Marty Heck, MD  10/09/2019 1:44 PM      ED meds:   NS bolus  cipro 400mg  IV  Flagyl 500mg  IV  Hydralazine 10mg  IV              Per surgery:  S/p colonoscopy evaluation with incidental  perforation of sigmoid diverticulum & clip placement  - CT AP demonstrating: small collections of extraluminal gas in retroperitoneum, clips noted in sigmoid colon. Expected findings given above    Plan:   - admit to observation  - NPO for now  - IV abx & fluconazole per Dr. [name], assistance appreciated  - repeat labs tomorrow  - monitor for now  - No indication for acute surgical intervention at this point  - serial abdominal exams    Per ID:    Zosyn 4.5 g IV every 8 hours   Diflucan 200 mg daily   Surgery follow-up   Correction of electrolytes   GI follow-up   Blood cultures if spikes more than 100.5    Discussed with treatment team   CBC, CMP tomorrow   We'll adjust the antimicrobials according to the cultures and clinical course    Per GI:  Assessment:     Colonic perforation, sigmoid with attempted endoscopic closure.  Interestingly, the retroperitoneal gas would indicate that the location was below the peritoneal reflection.  Recommendations:     N.p.o., IVs and antibiotics and surgical consultation.  I defer to the surgeons as to whether surgical intervention will be necessary, especially given the presenting anatomy, but there are reports of successful closure colon perforations  using endoscopic technique.    Per Medical:   # Colonic perforation s/p colonoscopy (Dr. [name], 10/09/2019)  CT abdomen/pelvis: There are innumerable small collections of extra luminal gas in the  retroperitoneum, presumably arising from a colonic perforation. The site of the perforation is not clear.  Clips are seen in the sigmoid colon from endoscopic biopsy.  Pt appears non-toxic, Vital signs stable, and patient denies abdominal pain   Admit to [facility], Dr. [name]   NPO    IV hydration   IV antibiotics per primary team   Dr. [name], gastroenterology, following     # Thrombocytopenia (96)  Baseline PLT level 134-158  Blood smear: + ovalocytes  Pt is non-compliant with ASA and statin,  denies taking frequent NSAIDS, no other new medications recently  No liver disease, liver and spleen evaluation on MRI 07/16/2019 was normal  No obvious bleeding and vital signs stable, H/H stable  S/p 1 liter fluid bolus in ER   Will monitor closely and will repeat with AM labs    # Hypertension  Home meds: Verapamil 120 mg twice daily and losartan 50 mg PO daily   States she has been seeing her cardiologist -  Heart Dr. [name], 1-2 times per week as her blood pressure has been uncontrolled recently   Patient NPO secondary to perforation; hold home antihypertensives    Start hydralazine 10 mg IV every 6 hours as needed systolic blood pressure greater than 170    # Hyperkalemia (5.9)   Repeat potassium level pending    # Diabetes mellitus type II  Home regimen: Glipizide XL10 mg daily , Tradjenta 5 mg daily , Lantus 10 units daily at bedtime  Managed by Dr. Smith Robert (last seen 07/22/2019)  Last HgA1C 7   Patient states that she has allergies to all subcutaneous insulins other than Lantus   Hold oral diabetic medications while NPO   Hold daily Lantus   Monitor blood glucose every 4 hours   Hypoglycemia protocol ordered    # Hyperlipidemia  Lipid panel 08/21/2019 - Chol 188, Trig 120, HDL 45, LDL elevated 119 (improved from 160 on 05/23/2019)   On statin at home -however she states that she does not take regularly as she does not like the side effects   Statin on hold while NPO - patient states she will not take it during this hospitalization    Outpatient followup     # 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  MRI Cystic structure in the pancreatic tail. No solid enhancing nodules are seen. Follow-up MRI with and without contrast in 6 months is recommended.  No intra or extrahepatic biliary dilatation. No evidence of  pancreatic divisum.  Other findings as above.   Plan for  followup with PCP outpatient     # Anxiety/depression  Takes as needed lorazepam outpatient  Currently NPO   can initiate Ativan 0.25 IV every 8 hours as needed anxiety     F/u outpatient psychiatry - patient has stopped celexa    # COVID19 06/27/2019  States she has had resolution in symptoms    # CVA with hemorragic stroke  Presented with left-sided weakness in 2012, has had resolution in symptoms  Recommended for daily aspirin, but is not complaint   Home ASA on hold while NPO    # Diabetic peripheral neuropathy  Has seen Dr.  Tuwiner in the past, not currently taking medication for this     # Obesity (BMI 34.11)  Recommend lifestyle modifications      ----Changed to inpatient on 10/10/19 on surgical unit-------------    VS: T 99, p 64, SATS 94% RA, resp 17, bp 147/78, pain 2/10  Abnormal labs: wbc 9.91, glucose 109, HA1C 7.2    Per surgery:   retroperitoneal colonoscopic sigmoid perforation, s/p clipping  Doing fairly well.  Afebrile, not tachycardic.  Pain minimal, and not worsening.  Hungry.    PLAN:   Will start clears, advance slowly.  Depending on status tomorrow, consider [place] on low residue diet.      Per GI:  Assessment:    Perforated sigmoid colon post colonoscopy, status post clipping   Imaging revealing retroperitoneal free air   Multiple medical problems including diabetes, hyperlipidemia, hypertension, diabetic neuropathy    Plan:    Surgical evaluation and follow-up is noted.   The patient is tolerating full liquid diet without significant abdominal pain, nausea or vomiting.   The patient is passing flatus with normal bowel sounds.   Continue with management as per surgical recommendations.   Diet advancement as tolerable.  On empiric IV antibiotics.   Follow-up with Dr. [name] after discharge from the hospital.

## 2019-10-10 NOTE — Plan of Care (Signed)
Problem: Moderate/High Fall Risk Score >5  Goal: Patient will remain free of falls  Outcome: Progressing  Flowsheets (Taken 10/10/2019 2200)  Moderate Risk (6-13):   MOD-Remain with patient during toileting   MOD-Apply bed exit alarm if patient is confused   MOD-Consider activation of bed alarm if appropriate   MOD-Place bedside commode and assistive devices out of sight when not in use   MOD-Re-orient confused patients   MOD-Perform dangle, stand, walk (DSW) when getting patient up   MOD-Request PT/OT consult order for patients with gait/mobility impairment   MOD-Utilize diversion activities   MOD-include family in multidisciplinary POC discussions   MOD-Use gait belt when appropriate     Problem: Safety  Goal: Patient will be free from injury during hospitalization  Outcome: Progressing  Flowsheets (Taken 10/10/2019 2251)  Patient will be free from injury during hospitalization:   Assess patient's risk for falls and implement fall prevention plan of care per policy   Provide alternative method of communication if needed (communication boards, writing)   Include patient/ family/ care giver in decisions related to safety   Ensure appropriate safety devices are available at the bedside   Assess for patients risk for elopement and implement Elopement Risk Plan per policy   Provide and maintain safe environment   Use appropriate transfer methods   Hourly rounding  Goal: Patient will be free from infection during hospitalization  Outcome: Progressing  Flowsheets (Taken 10/10/2019 2251)  Free from Infection during hospitalization:   Assess and monitor for signs and symptoms of infection   Encourage patient and family to use good hand hygiene technique   Monitor lab/diagnostic results     Problem: Pain  Goal: Pain at adequate level as identified by patient  Outcome: Progressing  Flowsheets (Taken 10/10/2019 2251)  Pain at adequate level as identified by patient:   Identify patient comfort function goal   Reassess pain  within 30-60 minutes of any procedure/intervention, per Pain Assessment, Intervention, Reassessment (AIR) Cycle   Offer non-pharmacological pain management interventions   Include patient/patient care companion in decisions related to pain management as needed   Evaluate patient's satisfaction with pain management progress   Consult/collaborate with Pain Service   Assess for risk of opioid induced respiratory depression, including snoring/sleep apnea. Alert healthcare team of risk factors identified.     Problem: Side Effects from Pain Analgesia  Goal: Patient will experience minimal side effects of analgesic therapy  Outcome: Progressing  Flowsheets (Taken 10/10/2019 2251)  Patient will experience minimal side effects of analgesic therapy:   Prevent/manage side effects per LIP orders (i.e. nausea, vomiting, pruritus, constipation, urinary retention, etc.)   Monitor/assess patient's respiratory status (RR depth, effort, breath sounds)   Assess for changes in cognitive function   Evaluate for opioid-induced sedation with appropriate assessment tool (i.e. POSS)     Problem: Discharge Barriers  Goal: Patient will be discharged home or other facility with appropriate resources  Outcome: Progressing  Flowsheets (Taken 10/10/2019 2251)  Discharge to home or other facility with appropriate resources:   Provide information on available health resources   Provide appropriate patient education   Initiate discharge planning     Problem: Psychosocial and Spiritual Needs  Goal: Demonstrates ability to cope with hospitalization/illness  Outcome: Progressing  Flowsheets (Taken 10/10/2019 2251)  Demonstrates ability to cope with hospitalizations/illness:   Encourage participation in diversional activity   Assist patient to identify own strengths and abilities   Encourage verbalization of feelings/concerns/expectations   Reinforce positive adaptation of  new coping behaviors   Encourage patient to set small goals for self   Provide  quiet environment     Problem: Compromised Tissue integrity  Goal: Damaged tissue is healing and protected  Outcome: Progressing  Flowsheets (Taken 10/10/2019 2251)  Damaged tissue is healing and protected:   Relieve pressure to bony prominences for patients at moderate and high risk   Monitor external devices/tubes for correct placement to prevent pressure, friction and shearing   Use bath wipes, not soap and water, for daily bathing   Monitor patient's hygiene practices   Keep intact skin clean and dry   Avoid shearing injuries   Encourage use of lotion/moisturizer on skin  Goal: Nutritional status is improving  Outcome: Progressing  Flowsheets (Taken 10/10/2019 2251)  Nutritional status is improving:   Allow adequate time for meals   Assist patient with eating

## 2019-10-10 NOTE — Progress Notes (Signed)
PROGRESS NOTE    Date Time: 10/10/19 3:01 PM  Patient Name: Emily Rowe, Emily Rowe [facility]      Assessment:    Perforated sigmoid colon post colonoscopy, status post clipping   Imaging revealing retroperitoneal free air   Multiple medical problems including diabetes, hyperlipidemia, hypertension, diabetic neuropathy    Plan:    Surgical evaluation and follow-up is noted.   The patient is tolerating full liquid diet without significant abdominal pain, nausea or vomiting.   The patient is passing flatus with normal bowel sounds.   Continue with management as per surgical recommendations.   Diet advancement as tolerable.  On empiric IV antibiotics.   Follow-up with Dr. [name] after discharge from the hospital.   Discussed with the patient in detail and her questions were answered.    Problem List:     Patient Active Problem List   Diagnosis    Uncontrolled type 2 diabetes mellitus with hyperglycemia    Mixed hyperlipidemia    Long-term insulin use    Obesity (BMI 30-39.9)    Neuropathy    Stroke    Essential hypertension    Diabetic peripheral neuropathy    History of hemorrhagic stroke with residual hemiparesis    Type 2 diabetes mellitus treated with insulin    COVID-19 virus infection    Pancreas cyst    Breast pain, left    Perforation of sigmoid colon    Perforated abdominal viscus       Subjective:   The patient is seen for Dr. [name]:  The patient reports no symptoms of abdominal pain, abdominal distention, nausea or vomiting.  She is complaining of mild discomfort in the lower belly without symptoms of pain, cramping or fever.  She reports passing flatus.  She has not had any bowel movement.  Currently the diet is advanced to full liquid.    Medications:     Current Facility-Administered Medications   Medication Dose Route Frequency    fluconazole  200 mg Intravenous Q24H SCH    heparin (porcine)  5,000 Units Subcutaneous Q8H SCH    losartan  50 mg Oral BID     piperacillin-tazobactam  4.5 g Intravenous Q8H SCH    verapamil ER  120 mg Oral Daily         Physical Exam:     Vitals:    10/10/19 1238   BP: 144/84   Pulse: 64   Resp: 16   Temp: 99 F (37.2 C)   SpO2: 97%       Intake and Output Summary (Last 24 hours) at Date Time    Intake/Output Summary (Last 24 hours) at 10/10/2019 1501  Last data filed at 10/10/2019 0831  Gross per 24 hour   Intake 900 ml   Output 3600 ml   Net -2700 ml       Temp (24hrs), Avg:98.7 F (37.1 C), Min:98.4 F (36.9 C), Max:99.1 F (37.3 C)      GENERAL: Patient is in no acute distress   HEENT: No scleral icterus or conjunctival pallor, moist mucous membranes   NECK: No jugular venous distention or thyromegaly, normal carotid upstrokes without bruits   CARDIAC: S1 & S2 regular,   CHEST: Clear to auscultation bilaterally, with decreased breath sounds at the bases bilaterally, without crepitus  ABDOMEN: Obese, mild tenderness on deep palpation in the lower abdomen without any rebound or guarding, non-distended, good bowel sounds, no masses, or hepatosplenomegaly, No abdominal bruits, no flank ecchymosis noted, there was no crepitus  EXTREMITIES: No clubbing, cyanosis, or edema,   SKIN: No rash or jaundice   NEUROLOGIC: Alert and oriented to time, place and person, normal mood and affect   Labs:     Results     Procedure Component Value Units Date/Time    Glucose Whole Blood - POCT [[id number]]  (Abnormal) Collected: 10/10/19 1109     Updated: 10/10/19 1116     Whole Blood Glucose POCT 138 mg/dL     Hemoglobin W0J [[id number]]  (Abnormal) Collected: 10/10/19 0412    Specimen: Blood Updated: 10/10/19 1114     Hemoglobin A1C 7.2 %      Average Estimated Glucose 159.9 mg/dL     Comprehensive metabolic panel [[id number]]  (Abnormal) Collected: 10/10/19 0129    Specimen: Blood Updated: 10/10/19 0606     Glucose 95 mg/dL      BUN 57.8 mg/dL      Creatinine 0.8 mg/dL      Sodium 469 mEq/L      Potassium 3.7 mEq/L      Chloride 107 mEq/L      CO2 25  mEq/L      Calcium 8.1 mg/dL      Protein, Total 6.0 g/dL      Albumin 3.3 g/dL      AST (SGOT) 12 U/L      ALT 12 U/L      Alkaline Phosphatase 69 U/L      Bilirubin, Total 0.4 mg/dL      Globulin 2.7 g/dL      Albumin/Globulin Ratio 1.2     Anion Gap 9.0    GFR [[id number]] Collected: 10/10/19 0129     Updated: 10/10/19 0606     EGFR >60.0    Glucose Whole Blood - POCT [[id number]]  (Abnormal) Collected: 10/10/19 0557     Updated: 10/10/19 0559     Whole Blood Glucose POCT 109 mg/dL     CBC and differential [[id number]]  (Abnormal) Collected: 10/10/19 0412     Updated: 10/10/19 0541     WBC 9.91 x10 3/uL      Hgb 11.4 g/dL      Hematocrit 74.2 %      Platelets 143 x10 3/uL      RBC 4.12 x10 6/uL      MCV 87.9 fL      MCH 27.7 pg      MCHC 31.5 g/dL      RDW 14 %      MPV 13.3 fL      Neutrophils 75.4 %      Lymphocytes Automated 18.3 %      Monocytes 5.4 %      Eosinophils Automated 0.1 %      Basophils Automated 0.4 %      Immature Granulocytes 0.4 %      Nucleated RBC 0.0 /100 WBC      Neutrophils Absolute 7.47 x10 3/uL      Lymphocytes Absolute Automated 1.81 x10 3/uL      Monocytes Absolute Automated 0.54 x10 3/uL      Eosinophils Absolute Automated 0.01 x10 3/uL      Basophils Absolute Automated 0.04 x10 3/uL      Immature Granulocytes Absolute 0.04 x10 3/uL      Absolute NRBC 0.00 x10 3/uL     Glucose Whole Blood - POCT [[id number]]  (Abnormal) Collected: 10/09/19 2347     Updated: 10/09/19 2350     Whole Blood Glucose POCT 112 mg/dL  Glucose Whole Blood - POCT [[id number]]  (Abnormal) Collected: 10/09/19 2028     Updated: 10/09/19 2033     Whole Blood Glucose POCT 123 mg/dL     Glucose Whole Blood - POCT [[id number]]  (Abnormal) Collected: 10/09/19 1727     Updated: 10/09/19 1729     Whole Blood Glucose POCT 137 mg/dL               Rads:   Radiological Procedure reviewed.    Signed by: Colon Branch

## 2019-10-10 NOTE — Progress Notes (Signed)
GENERAL SURGERY PROGRESS NOTE  [facility]    Date Time: 10/10/19 9:38 AM  Patient Name: [facility] Day: 0    ASSESSMENT:     Patient Active Problem List   Diagnosis   . Uncontrolled type 2 diabetes mellitus with hyperglycemia   . Mixed hyperlipidemia   . Long-term insulin use   . Obesity (BMI 30-39.9)   . Neuropathy   . Stroke   . Essential hypertension   . Diabetic peripheral neuropathy   . History of hemorrhagic stroke with residual hemiparesis   . Type 2 diabetes mellitus treated with insulin   . COVID-19 virus infection   . Pancreas cyst   . Breast pain, left   . Perforation of sigmoid colon   . Perforated abdominal viscus   retroperitoneal colonoscopic sigmoid perforation, s/p clipping  Doing fairly well.  Afebrile, not tachycardic.  Pain minimal, and not worsening.  Hungry.    PLAN:   Will start clears, advance slowly.  Depending on status tomorrow, consider Boomer on low residue diet.    SUBJECTIVE:   The patient is doing fairly well.  Abdominal pain is  decreased.  Current dietary status:  Diet NPO effective now Except for: SIPS WITH MEDS  Supervise For Meals Frequency: All meals and tolerating fairly well.  Flatus: yes. BM:  no.  Additional complaints: none     Objective:   Current Vitals:   Vitals:    10/10/19 0831   BP: 147/78   Pulse: 80   Resp: 17   Temp: 98.6 F (37 C)   SpO2: 94%       Intake and Output Summary (Last 24 hours):  I/O last 3 completed shifts:  In: 1400 [I.V.:900; IV Piggyback:500]  Out: 3600 [Urine:3600]    Labs:     Results     Procedure Component Value Units Date/Time    Comprehensive metabolic panel [[id number]]  (Abnormal) Collected: 10/10/19 0129    Specimen: Blood Updated: 10/10/19 0606     Glucose 95 mg/dL      BUN 82.9 mg/dL      Creatinine 0.8 mg/dL      Sodium 562 mEq/L      Potassium 3.7 mEq/L      Chloride 107 mEq/L      CO2 25 mEq/L      Calcium 8.1 mg/dL      Protein, Total 6.0 g/dL      Albumin 3.3 g/dL      AST (SGOT) 12 U/L       ALT 12 U/L      Alkaline Phosphatase 69 U/L      Bilirubin, Total 0.4 mg/dL      Globulin 2.7 g/dL      Albumin/Globulin Ratio 1.2     Anion Gap 9.0    GFR [[id number]] Collected: 10/10/19 0129     Updated: 10/10/19 0606     EGFR >60.0    Glucose Whole Blood - POCT [[id number]]  (Abnormal) Collected: 10/10/19 0557     Updated: 10/10/19 0559     Whole Blood Glucose POCT 109 mg/dL     CBC and differential [[id number]]  (Abnormal) Collected: 10/10/19 0412     Updated: 10/10/19 0541     WBC 9.91 x10 3/uL      Hgb 11.4 g/dL      Hematocrit 72.5 %      Platelets 143 x10 3/uL      RBC 4.12 x10 6/uL  MCV 87.9 fL      MCH 27.7 pg      MCHC 31.5 g/dL      RDW 14 %      MPV 13.3 fL      Neutrophils 75.4 %      Lymphocytes Automated 18.3 %      Monocytes 5.4 %      Eosinophils Automated 0.1 %      Basophils Automated 0.4 %      Immature Granulocytes 0.4 %      Nucleated RBC 0.0 /100 WBC      Neutrophils Absolute 7.47 x10 3/uL      Lymphocytes Absolute Automated 1.81 x10 3/uL      Monocytes Absolute Automated 0.54 x10 3/uL      Eosinophils Absolute Automated 0.01 x10 3/uL      Basophils Absolute Automated 0.04 x10 3/uL      Immature Granulocytes Absolute 0.04 x10 3/uL      Absolute NRBC 0.00 x10 3/uL     Hemoglobin A1C [[id number]] Collected: 10/10/19 0412    Specimen: Blood Updated: 10/10/19 0414    Glucose Whole Blood - POCT [[id number]]  (Abnormal) Collected: 10/09/19 2347     Updated: 10/09/19 2350     Whole Blood Glucose POCT 112 mg/dL     Glucose Whole Blood - POCT [[id number]]  (Abnormal) Collected: 10/09/19 2028     Updated: 10/09/19 2033     Whole Blood Glucose POCT 123 mg/dL     Glucose Whole Blood - POCT [[id number]]  (Abnormal) Collected: 10/09/19 1727     Updated: 10/09/19 1729     Whole Blood Glucose POCT 137 mg/dL     GFR [[id number]] Collected: 10/09/19 1418     Updated: 10/09/19 1458     EGFR >60.0    Basic Metabolic Panel [[id number]]  (Abnormal) Collected: 10/09/19 1418    Specimen: Blood Updated: 10/09/19 1458      Glucose 143 mg/dL      BUN 47.4 mg/dL      Creatinine 0.9 mg/dL      Calcium 8.8 mg/dL      Sodium 259 mEq/L      Potassium 4.4 mEq/L      Chloride 100 mEq/L      CO2 26 mEq/L      Anion Gap 14.0    Glucose Whole Blood - POCT [[id number]]  (Abnormal) Collected: 10/09/19 1444     Updated: 10/09/19 1446     Whole Blood Glucose POCT 105 mg/dL     Cell MorpHology [[id number]]  (Abnormal) Collected: 10/09/19 1043     Updated: 10/09/19 1123     Cell Morphology Abnormal     Platelet Estimate Decreased     Ovalocytes Present    CBC and differential [[id number]]  (Abnormal) Collected: 10/09/19 1043    Specimen: Blood Updated: 10/09/19 1122     WBC 8.47 x10 3/uL      Hgb 12.2 g/dL      Hematocrit 10.9 %      Platelets 96 x10 3/uL      RBC 4.31 x10 6/uL      MCV 88.4 fL      MCH 28.3 pg      MCHC 32.0 g/dL      RDW 14 %      MPV 13.6 fL      Neutrophils 71.5 %      Lymphocytes Automated 22.0 %      Monocytes 5.3 %  Eosinophils Automated 0.4 %      Basophils Automated 0.4 %      Immature Granulocytes 0.4 %      Nucleated RBC 0.0 /100 WBC      Neutrophils Absolute 6.07 x10 3/uL      Lymphocytes Absolute Automated 1.86 x10 3/uL      Monocytes Absolute Automated 0.45 x10 3/uL      Eosinophils Absolute Automated 0.03 x10 3/uL      Basophils Absolute Automated 0.03 x10 3/uL      Immature Granulocytes Absolute 0.03 x10 3/uL      Absolute NRBC 0.00 x10 3/uL     Comprehensive metabolic panel [[id number]]  (Abnormal) Collected: 10/09/19 1043    Specimen: Blood Updated: 10/09/19 1110     Glucose 125 mg/dL      BUN 40.9 mg/dL      Creatinine 0.8 mg/dL      Sodium 811 mEq/L      Potassium 5.9 mEq/L      Chloride 103 mEq/L      CO2 22 mEq/L      Calcium 8.2 mg/dL      Protein, Total 7.7 g/dL      Albumin 3.9 g/dL      AST (SGOT) 30 U/L      ALT 17 U/L      Alkaline Phosphatase 81 U/L      Bilirubin, Total 0.4 mg/dL      Globulin 3.8 g/dL      Albumin/Globulin Ratio 1.0     Anion Gap 12.0    GFR [[id number]] Collected: 10/09/19 1043      Updated: 10/09/19 1110     EGFR >60.0          Rads:     Radiology Results (24 Hour)     Procedure Component Value Units Date/Time    Chest AP Portable [[id number]] Collected: 10/09/19 1343    Order Status: Completed Updated: 10/09/19 1346    Narrative:      History: Postoperative.    Comparison 07/01/2019    TECHNIQUE: AP portable upright    FINDINGS: Prominent soft tissue artifact in this patient. Low volume  film with vascular distention crowding of vessels. No consolidation  effusion or pneumothorax. Heart and mediastinal contours prominent to  the lower lung volumes. Bones and soft tissues unchanged.      Impression:      . Low-volume film with vascular distention. No consolidation    Marty Heck, MD   10/09/2019 1:44 PM    CT Abd/ Pelvis with IV Contrast [[id number]] Collected: 10/09/19 1207    Order Status: Completed Updated: 10/09/19 1221    Narrative:      Indication: Possible perforation during colonoscopy.    Technique: Contrast-enhanced CT of the abdomen and pelvis. 100 mm of  Omnipaque 350 were administered intravenously. No oral contrast  material.    Comparison: MR of the abdomen September 29. CT of the abdomen and pelvis  September 14.    Findings: Clips are seen in the sigmoid colon, presumably from  endoscopic biopsy. There are innumerable tiny bubbles of extraluminal  gas in the retroperitoneum, mostly on the right. The site of origin is  unclear. No colonic wall thickening is seen. No abscess. No free  intraperitoneal gas or free fluid. No dilated bowel loops.  Diverticulosis of the colon is mild.    A subcentimeter lesion of the left hepatic lobe is unchanged from prior  exam and likely to be a tiny benign  cyst or hemangioma. Unremarkable  gallbladder. No dilatation of the biliary tree. Unremarkable spleen.  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 Unremarkable adrenals and kidneys. There is a  small amount of calcified atherosclerotic  plaque. No aneurysm of the  abdominal aorta. There is a chronic collection in the subcutaneous  changes fatty tissue of the anterior abdominal wall centered to the  right of midline below the umbilicus measuring approximately 6 cm in  diameter 1.5 cm in thickness, likely a consequence of prior abdominal  surgery. There is some stranding of the subcutaneous fatty tissue that  is possibly from liposuction. The uterus is absent. No adnexal masses or  cystic lesions are seen. Included portions of the lungs and mediastinum  are unremarkable.      Impression:        1. There are innumerable small collections of extra luminal gas in the  retroperitoneum, presumably arising from a colonic perforation. The site  of the perforation is not clear.  2. Clips are seen in the sigmoid colon from endoscopic biopsy.  3. Additional chronic findings detailed above.    Urgent findings were reported to and acknowledged by PA Adelene Idler at 1215  hours.    Wynema Birch, MD   10/09/2019 12:19 PM            Medications:     Current Facility-Administered Medications   Medication Dose Route Frequency   . fluconazole  200 mg Intravenous Q24H SCH   . heparin (porcine)  5,000 Units Subcutaneous Q8H SCH   . losartan  50 mg Oral BID   . piperacillin-tazobactam  4.5 g Intravenous Q8H SCH   . verapamil ER  120 mg Oral Daily     . sodium chloride 100 mL/hr at 10/09/19 1735     acetaminophen, Nursing communication: Adult Hypoglycemia Treatment Algorithm **AND** dextrose **AND** dextrose **AND** glucagon (rDNA), hydrALAZINE, LORazepam, morphine, naloxone, ondansetron, traMADol     Physical Exam:     General appearance - alert, well appearing, and in no distress  Mental status - alert, oriented to person, place, and time  Chest - clear to auscultation, no wheezes, rales or rhonchi, symmetric air entry  Heart - normal rate, regular rhythm, normal S1, S2, no murmurs, rubs, clicks or gallops  Abdomen - soft and minimally tender with no rebound  Extremities  - peripheral pulses normal, no pedal edema, no clubbing or cyanosis    Signed by: Rick Duff

## 2019-10-10 NOTE — Progress Notes (Addendum)
[facility] HOSPITALIST  Progress Note  Patient Info:   Date/Time: 10/10/2019 / 11:53 PM   Admit Date:10/09/2019  Patient Name:Emily Rowe   AOZ:[id number]   PCP: Sabas Sous, MD  Attending Physician:Scriven, Lupita Raider, MD     Assessment and Plan:   Ms. Emily Rowe is a 68 year old female with  Past medical history of hypertension, diabetes mellitus, depression, dyslipidemia, stroke, asthma, was sent by her Gastroenterologist Dr. [name]  status post scheduled colonoscopy  for history of chronic abdominal bloating and constipation.  Diverticulosis with moderately tortuous colon was encountered and the colonoscopy complicated by colonic wall tear with observation of omental tissue beyond it, endoscopic closure was attempted with resolution clips, subsequently patient was taken to the emergency room and admitted under general surgical team    CT abdomen pelvis obtained in emergency room and was notable for innumerable small collections of extra luminal gas in the retroperitoneum, presumably arising from a colonic perforation. The site of the perforation is not clear. Clips are seen in the sigmoid colon from endoscopic biopsy    1. Colonic perforation s/p colonoscopy (Dr. [name], 10/09/2019)  CT abdomen/pelvis: There are innumerable small collections of extra luminal gas in the retroperitoneum, presumably arising from a colonic perforation. The site of the perforation is not clear.  Clips are seen in the sigmoid colon from endoscopic biopsy.  Vital signs stable, and patient denies abdominal pain"  Manage as per [facility]  Starts clears  IV hydration  IV Zosyn and Diflucan per ID  Dr. [name], gastroenterology, following     2 Thrombocytopenia (96) likely dilutional Normalized.  Blood smear: + ovalocytes  Pt is non-compliant with ASA and statin  No obvious bleeding and vital signs stable, H/H stable    3 Hypertension  Home meds: Verapamil 120 mg twice daily  and losartan 50 mg PO daily   States she has been seeing her cardiologist - [facility] Dr. [name], 1-2 times per week as her blood pressure has been uncontrolled recently  Patient NPO secondary to perforation; hold home antihypertensives Start hydralazine 10 mg IV every 6 hours as needed systolic blood pressure greater than 170    3 Hyperkalemia (5.9). Resolved. Monitor    4. Diabetes mellitus type II with neuropathy  Home regimen: Glipizide XL10 mg daily , Tradjenta 5 mg daily , Lantus 10 units daily at bedtime, Which will be held dur to NPO state  Managed by Dr. Smith Robert (last seen 07/22/2019)  Last HgA1C 7   Patient states that she has allergies to all subcutaneous insulins other than Lantus    5. Hyperlipidemia  Lipid panel 08/21/2019 - Chol 188, Trig 120, HDL 45, LDL elevated 119 (improved from 160 on 05/23/2019)   On statin at home -however she states that she does not take regularly as she does not like the side effects    6. 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  MRI Cystic structure in the pancreatic tail. No solid enhancing nodules are seen. Follow-up MRI with and without contrast in 6 months is recommended.  No intra or extrahepatic biliary dilatation. No evidence of  pancreatic divisum.  Other findings as above.  Plan for followup with PCP and GI, Dr. [name]  7.  Anxiety/depression  Takes as needed lorazepam outpatient   outpatient psychiatry    8.   COVID19 06/27/2019  States she has had resolution in symptoms    9.  CVA with hemorragic stroke  Presented with left-sided weakness in 2012, has had resolution in symptoms  Recommended for daily aspirin, but is not complaint    10. Obesity (BMI 34.11)  Recommend lifestyle  modifications    Disposition: Advance diet as per Surgical team.     DVT Prohylaxis: Per Primary team  Central Line/Foley Catheter/PICC line status: none  Code Status: Full Code  Disposition:home  Type of Admission:Inpatient  Expected Date of Discharge: 10/11/2019  Milestones required for discharge: tolerate PO  Hospital Problems:   Principal Problem:    Perforation of sigmoid colon  Active Problems:    Uncontrolled type 2 diabetes mellitus with hyperglycemia    Essential hypertension    Perforated abdominal viscus    Subjective:   10/10/19     Mild abdominal pain, no chest pain or shortness of breath  Chief Complaint:  Post-op Problem    Review of Systems   Constitutional: Negative for fever and malaise/fatigue.   Respiratory: Negative for cough, shortness of breath and wheezing.    Cardiovascular: Negative for chest pain and leg swelling.   Gastrointestinal: Negative for abdominal pain, diarrhea and vomiting.   Genitourinary: Negative for dysuria, frequency and urgency.   Neurological: Negative for dizziness, sensory change, speech change, focal weakness and headaches.   Psychiatric/Behavioral: Negative for memory loss. The patient is not nervous/anxious.      Objective:     Vitals:    10/10/19 1238 10/10/19 1615 10/10/19 1719 10/10/19 2126   BP: 144/84 121/73 145/67 136/85   Pulse: 64 75  73   Resp: 16 16  14    Temp: 99 F (37.2 C) 98.6 F (37 C)  98.4 F (36.9 C)   TempSrc: Oral Oral  Oral   SpO2: 97% 97%  95%   Weight:       Height:         Physical Exam:   Physical Exam   Constitutional: She is oriented to person, place, and time.   HENT:   Mouth/Throat: No oropharyngeal exudate.   Eyes: No scleral icterus.   Cardiovascular: Normal rate and regular rhythm.   No murmur heard.  Pulmonary/Chest: Effort normal and breath sounds normal. No respiratory distress. She has no wheezes. She has no rales.   Abdominal: Soft. Bowel sounds are normal. She exhibits no distension. There is no abdominal tenderness. There is  no rebound and no guarding.   Musculoskeletal: Normal range of motion.         General: No edema.   Neurological: She is alert and oriented to person, place, and time. No cranial nerve deficit. GCS score is 15.   Skin: Skin is warm and dry. No erythema.   Psychiatric: Mood and memory normal.     Results of Labs/imaging   Labs and radiology reports have been reviewed.    Hospitalist   Signed by:   [id number] [place] Else Paulo Keimig  10/10/2019 11:53 PM    *This note was generated by the Epic EMR system/ Dragon speech recognition and may contain inherent errors or omissions not intended by the user. Grammatical errors, random word insertions, deletions, pronoun errors and incomplete sentences are occasional consequences of this technology due to software limitations. Not all errors are caught or corrected. If there are questions or concerns about the  content of this note or information contained within the body of this dictation they should be addressed directly with the author for clarification

## 2019-10-10 NOTE — Progress Notes (Deleted)
Situation Post op problem.  Perforated abdomina viscus.      Background Lives alone, independent. Drives. This cm spoke with the daughter via phone.      Assessment aox3     Recommendation Home with no need          10/10/19 1532   Patient Type   Within 30 Days of Previous Admission? No   Healthcare Decisions   Interviewed: Family   Name of interviewee if other than the pt: spoke with the son.   Orientation/Decision Making Abilities of Patient Alert and Oriented x3, able to make decisions   Advance Directive Patient does not have advance directive   Prior to admission   Prior level of function Independent with ADLs;Ambulates independently   Home Layout Multi-level   Have running water, electricity, heat, etc? Yes   Living Arrangements Alone   How do you get to your MD appointments? patient drives   Adult Management consultant (APS) involved? No   Discharge Planning   Anticipated [place] plan discussed with: Family   Potential barriers to discharge: Testing/procedure   Mode of transportation: Private car (family member)   Consults/Providers   Correct PCP listed in Epic? Yes   Important Message from [facility] Notice   Patient received 1st IMM Letter? No

## 2019-10-10 NOTE — Progress Notes (Signed)
Infectious Disease            Progress Note    10/10/2019   Emily Rowe [id number]:[id number] is a 68 y.o. female, with past medical history significant for diabetes mellitus, diabetic neuropathy, obesity, history of CVA with hemorrhagic stroke, history of COVID-19 recently, anxiety, depression, history of pancreatic lesion, anxiety, depression admitted with colon perforation after colonoscopy.    Subjective:     Emily Rowe today Symptoms: Afebrile, no leukocytosis, tolerating some clears, denies any significant pain, no vomiting or diarrhea. No shortness of breath,cough, chest pain, chest pressure.Other review of system is non contributory.    Objective:     Blood pressure 147/78, pulse 80, temperature 98.6 F (37 C), temperature source Oral, resp. rate 17, height 1.651 m (5\' 5" ), weight 99 kg (218 lb 3.4 oz), SpO2 94 %.    General Appearance:  Looks comfortable  HEENT: Pallor negative, Anicteric sclera.   Neck: Supple  Lungs:Decreased breath sound at bases.   Chest Wall: Symmetric chest wall expansion.   Heart : S1 and S2.   Abdomen: Abdomen is soft, bowel sounds positive, no significant tenderness  Neurological: Alert and oriented to person, place and time, moves all extremities.   Psychiatric: Mood and affect is normal    Laboratory And Diagnostic Studies:     Recent Labs     10/10/19  0412 10/09/19  1043   WBC 9.91* 8.47   Hgb 11.4 12.2   Hematocrit 36.2 38.1   Platelets 143 96*   Neutrophils 75.4 71.5     Recent Labs     10/10/19  0129 10/09/19  1418   Sodium 141 140   Potassium 3.7 4.4   Chloride 107 100   CO2 25 26   BUN 10.0 10.6   Creatinine 0.8 0.9   Glucose 95 143*   Calcium 8.1* 8.8     Recent Labs     10/10/19  0129 10/09/19  1043   AST (SGOT) 12 30   ALT 12 17   Alkaline Phosphatase 69 81   Protein, Total 6.0 7.7   Albumin 3.3* 3.9   Bilirubin, Total 0.4 0.4       Current Med's:     Current Facility-Administered Medications   Medication Dose Route  Frequency    fluconazole  200 mg Intravenous Q24H SCH    heparin (porcine)  5,000 Units Subcutaneous Q8H SCH    losartan  50 mg Oral BID    piperacillin-tazobactam  4.5 g Intravenous Q8H SCH    verapamil ER  120 mg Oral Daily       Lines/Drains:     Patient Lines/Drains/Airways Status    Active Lines, Drains and Airways     Name:   Placement date:   Placement time:   Site:   Days:    Peripheral IV 10/09/19 20 G Left Forearm   10/09/19    --    Forearm   1    Peripheral IV 10/09/19 20 G Right Antecubital   10/09/19    1400    Antecubital   less than 1    External Urinary Catheter   10/09/19    1920    --   less than 1                Assessment:      Condition: Guarded   Colonic perforation   Status post colonoscopy   Thrombocytopenia   Diabetes mellitus   Diabetic neuropathy  Anxiety, depression   Asthma   Hypertension   History of stroke    Plan:      Continue Zosyn   Continue Diflucan   Surgery follow-up   GI follow-up   Will follow cultures   Continue supportive care   Discussed with Dr.Aye   Possible discharge tomorrow if remains stable          Emily Rowe, M.D.,FACP  10/10/2019  11:57 AM          *This note was generated by the Epic EMR system/ Dragon speech recognition and may contain inherent errors or omissions not intended by the user. Grammatical errors, random word insertions, deletions, pronoun errors and incomplete sentences are occasional consequences of this technology due to software limitations. Not all errors are caught or corrected. If there are questions or concerns about the content of this note or information contained within the body of this dictation they should be addressed directly with the author for clarification

## 2019-10-11 LAB — CBC AND DIFFERENTIAL
Absolute NRBC: 0 10*3/uL (ref 0.00–0.00)
Basophils Absolute Automated: 0.02 10*3/uL (ref 0.00–0.08)
Basophils Automated: 0.3 %
Eosinophils Absolute Automated: 0.03 10*3/uL (ref 0.00–0.44)
Eosinophils Automated: 0.4 %
Hematocrit: 36.3 % (ref 34.7–43.7)
Hgb: 11.4 g/dL (ref 11.4–14.8)
Immature Granulocytes Absolute: 0.03 10*3/uL (ref 0.00–0.07)
Immature Granulocytes: 0.4 %
Lymphocytes Absolute Automated: 2.01 10*3/uL (ref 0.42–3.22)
Lymphocytes Automated: 28.8 %
MCH: 27.6 pg (ref 25.1–33.5)
MCHC: 31.4 g/dL — ABNORMAL LOW (ref 31.5–35.8)
MCV: 87.9 fL (ref 78.0–96.0)
MPV: 13.2 fL — ABNORMAL HIGH (ref 8.9–12.5)
Monocytes Absolute Automated: 0.42 10*3/uL (ref 0.21–0.85)
Monocytes: 6 %
Neutrophils Absolute: 4.47 10*3/uL (ref 1.10–6.33)
Neutrophils: 64.1 %
Nucleated RBC: 0 /100 WBC (ref 0.0–0.0)
Platelets: 136 10*3/uL — ABNORMAL LOW (ref 142–346)
RBC: 4.13 10*6/uL (ref 3.90–5.10)
RDW: 14 % (ref 11–15)
WBC: 6.98 10*3/uL (ref 3.10–9.50)

## 2019-10-11 LAB — GLUCOSE WHOLE BLOOD - POCT: Whole Blood Glucose POCT: 114 mg/dL — ABNORMAL HIGH (ref 70–100)

## 2019-10-11 MED ORDER — AMOXICILLIN-POT CLAVULANATE 875-125 MG PO TABS
1.00 | ORAL_TABLET | Freq: Two times a day (BID) | ORAL | 0 refills | Status: AC
Start: 2019-10-11 — End: 2019-10-18

## 2019-10-11 NOTE — Progress Notes (Signed)
GENERAL SURGERY PROGRESS NOTE  [facility]    Date Time: 10/11/19 8:41 AM  Patient Name: [facility] Day: 1    ASSESSMENT:     Patient Active Problem List   Diagnosis   . Uncontrolled type 2 diabetes mellitus with hyperglycemia   . Mixed hyperlipidemia   . Long-term insulin use   . Obesity (BMI 30-39.9)   . Neuropathy   . Stroke   . Essential hypertension   . Diabetic peripheral neuropathy   . History of hemorrhagic stroke with residual hemiparesis   . Type 2 diabetes mellitus treated with insulin   . COVID-19 virus infection   . Pancreas cyst   . Breast pain, left   . Perforation of sigmoid colon   . Perforated abdominal viscus   feels well.  No significant pain.  BM last night.  Tol fulls.    PLAN:   Home on po abx and low residue diet.  FU in office 10 days    SUBJECTIVE:   The patient is doing well.  Abdominal pain is  decreased.  Current dietary status:  Supervise For Meals Frequency: All meals  Diet low fiber and tolerating well.  Flatus: yes. BM:  yes.  Additional complaints: none     Objective:   Current Vitals:   Vitals:    10/11/19 0725   BP: 144/85   Pulse: 80   Resp: 18   Temp: 97.7 F (36.5 C)   SpO2: 94%       Intake and Output Summary (Last 24 hours):  I/O last 3 completed shifts:  In: 2150 [P.O.:600; I.V.:1450; IV Piggyback:100]  Out: 1000 [Urine:1000]    Labs:     Results     Procedure Component Value Units Date/Time    Glucose Whole Blood - POCT [[id number]]  (Abnormal) Collected: 10/11/19 0718     Updated: 10/11/19 0725     Whole Blood Glucose POCT 114 mg/dL     CBC and differential [[id number]]  (Abnormal) Collected: 10/11/19 0608    Specimen: Blood Updated: 10/11/19 0719     WBC 6.98 x10 3/uL      Hgb 11.4 g/dL      Hematocrit 78.2 %      Platelets 136 x10 3/uL      RBC 4.13 x10 6/uL      MCV 87.9 fL      MCH 27.6 pg      MCHC 31.4 g/dL      RDW 14 %      MPV 13.2 fL      Neutrophils 64.1 %      Lymphocytes Automated 28.8 %      Monocytes 6.0 %       Eosinophils Automated 0.4 %      Basophils Automated 0.3 %      Immature Granulocytes 0.4 %      Nucleated RBC 0.0 /100 WBC      Neutrophils Absolute 4.47 x10 3/uL      Lymphocytes Absolute Automated 2.01 x10 3/uL      Monocytes Absolute Automated 0.42 x10 3/uL      Eosinophils Absolute Automated 0.03 x10 3/uL      Basophils Absolute Automated 0.02 x10 3/uL      Immature Granulocytes Absolute 0.03 x10 3/uL      Absolute NRBC 0.00 x10 3/uL     Glucose Whole Blood - POCT [[id number]]  (Abnormal) Collected: 10/10/19 2142     Updated: 10/10/19  2144     Whole Blood Glucose POCT 105 mg/dL     Glucose Whole Blood - POCT [[id number]]  (Abnormal) Collected: 10/10/19 1620     Updated: 10/10/19 1622     Whole Blood Glucose POCT 136 mg/dL     Glucose Whole Blood - POCT [[id number]]  (Abnormal) Collected: 10/10/19 1109     Updated: 10/10/19 1116     Whole Blood Glucose POCT 138 mg/dL     Hemoglobin C6C [[id number]]  (Abnormal) Collected: 10/10/19 0412    Specimen: Blood Updated: 10/10/19 1114     Hemoglobin A1C 7.2 %      Average Estimated Glucose 159.9 mg/dL           Rads:     Radiology Results (24 Hour)     ** No results found for the last 24 hours. **            Medications:     Current Facility-Administered Medications   Medication Dose Route Frequency   . fluconazole  200 mg Intravenous Q24H SCH   . heparin (porcine)  5,000 Units Subcutaneous Q8H SCH   . losartan  50 mg Oral BID   . piperacillin-tazobactam  4.5 g Intravenous Q8H SCH   . verapamil ER  120 mg Oral Daily       acetaminophen, Nursing communication: Adult Hypoglycemia Treatment Algorithm **AND** dextrose **AND** dextrose **AND** glucagon (rDNA), hydrALAZINE, LORazepam, morphine, naloxone, ondansetron, traMADol     Physical Exam:     General appearance - alert, well appearing, and in no distress  Mental status - alert, oriented to person, place, and time  Chest - clear to auscultation, no wheezes, rales or rhonchi, symmetric air entry  Heart - normal rate, regular  rhythm, normal S1, S2, no murmurs, rubs, clicks or gallops  Abdomen - soft, nontender, nondistended, no masses or organomegaly  Extremities - peripheral pulses normal, no pedal edema, no clubbing or cyanosis    Signed by: Rick Duff

## 2019-10-11 NOTE — Discharge Instr - AVS First Page (Signed)
Admit Date: 10/09/2019   Discharge Date: 10/11/2019     Attending: Talbert Nan, MD   Surgeon: * Surgery not found *            DISCHARGE INSTRUCTIONS-COLON     Patient Instructions after your colon surgery.  Please refer to the complete postop instructions for details.      DIET:     Keep up with your liquids.   If your appetite has returned, eat what your system can tolerate.    Avoid hard to digest material, such as seeds, nuts, raw vegetables or fruits with pulp, or other high-roughage foods, until your follow up visit    ACTIVITY:     If it hurts, don't do it!   Walking & stairs are encouraged.     Avoid strenuous physical activity & lifting until cleared by your surgeon.   You may drive again once you have stopped taking prescription medication for pain.      MEDICATION:     Resume all home medications.   Use over-the-counter pain medications or a prescribed narcotic for your discomfort as needed.     For constipation, an over-the-counter stool softener or laxative may be taken.    WHAT TO LOOK FOR:    Please call our office immediately at [phone] or go to the ER if you develop any of the following:        Fever over 101F   Increased abdominal pain   Persistent nausea or vomiting   Difficulty with urination   Difficulty breathing, chest pain or calf pain    FOLLOW-UP:     We will see you in our office 1-2 weeks after your surgery.   If not already scheduled, please call [phone] to arrange your post op visit.

## 2019-10-14 ENCOUNTER — Telehealth (INDEPENDENT_AMBULATORY_CARE_PROVIDER_SITE_OTHER): Payer: Self-pay

## 2019-10-14 NOTE — Telephone Encounter (Signed)
Hospital D/C Template    Chart Review:     Type of Encounter : Inpatient  Facility: [facility]  Discharge Date: 10/11/19   Primary Discharge Dx:S/p colonoscopy evaluation with incidental perforation of sigmoid diverticulum & clip placement    -Uncontrolled type 2 diabetes mellitus with hyperglycemia  Essential hypertension   Perforation of sigmoid colon  Perforated abdominal viscus    Follow Up Appt with PCP/Specialist: pt will see Surgery for FUV, declined additional PCP fuv at this time      Patient Interview:    o I heard you went to the hospital for abdom pain, tell me what happened? Pain in abd after colonoscopy, pt states she had perforation of colon during the colonoscopy procedure and was sent by her MD to ER  o What type of teaching did you receive regarding your symptoms/diagnosis? From colon perf- tests done, no surgery needed for now, and on oral abx, low fiber diet  o What symptoms do you have now, if any? Pt states she has some lower abdom pain, lessened with movement and if she passes flatus. Denies fever, admits to some chills, no sob. Denies N/V, has had some diarrhea, less over past days and had more formed BM today. Voiding without problems. Up OOB and walking in home. On Low fiber diet, small meals, increased fluids. Reports BG ranges 105-120.   ? Tell me about your current medications and any changes the hospital made: resumed routine meds per AVS  o If new medication prescribed: Augmentin bid x 7 days, oral abx  - Tell me about the new medication and how you are to take it? Reviewed dose/purpose during call, advised to take full course of abx  - What difficulty did you have, if any, picking up your medication(s)? none  o If the new medication is an injection: n/a- continues on routine doses of lantus, no dose changes made  -   ? What appointments have you made for follow-up? Plans to call and schedule surgery FUV today, due in 2 weeks for a surgery FUV. Declined additional PCP fuv at this  time, has routine 3 month FUV scheduled in 2/21.  o How can I help you to schedule these appointments? Advised of telemed appt available if needed  o What transportation options do you have to make it to your appointments? Drives self  o What assistance, if any, are you in need of while continuing your care at home? Home with spouse, pt is indep in self care and mobility      Thank you for taking my call today! If there is anything that you need, please give your office a call at [place], [phone].       *Instructed patient to call back if symptoms change or worsen and/or go to the emergency room or call 911 for emergency symptoms*

## 2019-10-16 NOTE — Discharge Summary (Signed)
Discharge Summary    Date:10/16/2019   Patient Name: Emily Rowe, Emily Rowe [facility]  Attending Physician: No att. providers found    Date of Admission:   10/09/2019    Date of Discharge:    10/16/2019    Admitting Diagnosis:   Perforated abdominal viscus [R19.8]      Problems:   Problem Lists:  Patient Active Problem List   Diagnosis   . Uncontrolled type 2 diabetes mellitus with hyperglycemia   . Mixed hyperlipidemia   . Long-term insulin use   . Obesity (BMI 30-39.9)   . Neuropathy   . Stroke   . Essential hypertension   . Diabetic peripheral neuropathy   . History of hemorrhagic stroke with residual hemiparesis   . Type 2 diabetes mellitus treated with insulin   . COVID-19 virus infection   . Pancreas cyst   . Breast pain, left   . Perforation of sigmoid colon   . Perforated abdominal viscus                Discharge Dx:     Principal Diagnosis (Diagnosis after study, that is chiefly responsible for admission to inpatient status):   Colon perforation      Treatment Team:   Treatment Team:   Consulting Physician: Rick Duff, MD  Consulting Physician: Edrick Oh, MD  Consulting Physician: Sunny Schlein, MD  Consulting Physician: Alfonzo Beers, MD     Procedures performed:   Dxa Bone Density Axial Skeleton    Result Date: 09/24/2019   The lowest T score 0.2 1)   DIAGNOSIS:  Normal       -Caution: Medical conditions other than osteoporosis may cause low bone density, such as osteomalacia or renal osteodystrophy. Clinical correlation is necessary.   2)   FRACTURE RISK: Not Increased       -Caution: Fracture risk may be increased independent of BMD in patients with glucocorticoid use, age greater than 65 years, or a history of prior fragility fracture.    Reported by:   on 09/24/2019 11:19:00 AM. Blair Promise, MD  09/24/2019 11:17 AM    Ct Abd/ Pelvis With Iv Contrast    Result Date: 10/09/2019  1. There are innumerable small collections of extra luminal gas in the retroperitoneum,  presumably arising from a colonic perforation. The site of the perforation is not clear. 2. Clips are seen in the sigmoid colon from endoscopic biopsy. 3. Additional chronic findings detailed above. Urgent findings were reported to and acknowledged by PA Adelene Idler at 1215 hours. Wynema Birch, MD  10/09/2019 12:19 PM    Chest Ap Portable    Result Date: 10/09/2019  . Low-volume film with vascular distention. No consolidation Marty Heck, MD  10/09/2019 1:44 PM            Reason for Admission:     Colon perforation    Hospital Course:     Admitted after outpatient colonocopy resulted in perforation, clips applied at the time.  Pt monitored, NPO initially, with IV antibiotics.  Smooth course, and diet was resumed.  Home when tolerating on low residue diet with plans for office follow up.    Condition at Discharge:     Stable      Today:     BP 144/85   Pulse 80   Temp 97.7 F (36.5 C) (Oral)   Resp 18   Ht 1.651 m (5\' 5" )   Wt 99 kg (218 lb 3.4 oz)   SpO2  94%   BMI 36.31 kg/m   Ranges for the last 24 hours:       Last set of labs   Results     Procedure Component Value Units Date/Time    Glucose Whole Blood - POCT [[id number]]  (Abnormal) Collected: 10/11/19 0718     Updated: 10/11/19 0725     Whole Blood Glucose POCT 114 mg/dL     CBC and differential [[id number]]  (Abnormal) Collected: 10/11/19 0608    Specimen: Blood Updated: 10/11/19 0719     WBC 6.98 x10 3/uL      Hgb 11.4 g/dL      Hematocrit 10.9 %      Platelets 136 x10 3/uL      RBC 4.13 x10 6/uL      MCV 87.9 fL      MCH 27.6 pg      MCHC 31.4 g/dL      RDW 14 %      MPV 13.2 fL      Neutrophils 64.1 %      Lymphocytes Automated 28.8 %      Monocytes 6.0 %      Eosinophils Automated 0.4 %      Basophils Automated 0.3 %      Immature Granulocytes 0.4 %      Nucleated RBC 0.0 /100 WBC      Neutrophils Absolute 4.47 x10 3/uL      Lymphocytes Absolute Automated 2.01 x10 3/uL      Monocytes Absolute Automated 0.42 x10 3/uL      Eosinophils Absolute  Automated 0.03 x10 3/uL      Basophils Absolute Automated 0.02 x10 3/uL      Immature Granulocytes Absolute 0.03 x10 3/uL      Absolute NRBC 0.00 x10 3/uL     Glucose Whole Blood - POCT [[id number]]  (Abnormal) Collected: 10/10/19 2142     Updated: 10/10/19 2144     Whole Blood Glucose POCT 105 mg/dL     Glucose Whole Blood - POCT [[id number]]  (Abnormal) Collected: 10/10/19 1620     Updated: 10/10/19 1622     Whole Blood Glucose POCT 136 mg/dL     Glucose Whole Blood - POCT [[id number]]  (Abnormal) Collected: 10/10/19 1109     Updated: 10/10/19 1116     Whole Blood Glucose POCT 138 mg/dL     Hemoglobin V6H [[id number]]  (Abnormal) Collected: 10/10/19 0412    Specimen: Blood Updated: 10/10/19 1114     Hemoglobin A1C 7.2 %      Average Estimated Glucose 159.9 mg/dL     Comprehensive metabolic panel [[id number]]  (Abnormal) Collected: 10/10/19 0129    Specimen: Blood Updated: 10/10/19 0606     Glucose 95 mg/dL      BUN 03.5 mg/dL      Creatinine 0.8 mg/dL      Sodium 009 mEq/L      Potassium 3.7 mEq/L      Chloride 107 mEq/L      CO2 25 mEq/L      Calcium 8.1 mg/dL      Protein, Total 6.0 g/dL      Albumin 3.3 g/dL      AST (SGOT) 12 U/L      ALT 12 U/L      Alkaline Phosphatase 69 U/L      Bilirubin, Total 0.4 mg/dL      Globulin 2.7 g/dL      Albumin/Globulin Ratio 1.2     Anion Gap  9.0    GFR [[id number]] Collected: 10/10/19 0129     Updated: 10/10/19 0606     EGFR >60.0    Glucose Whole Blood - POCT [[id number]]  (Abnormal) Collected: 10/10/19 0557     Updated: 10/10/19 0559     Whole Blood Glucose POCT 109 mg/dL     CBC and differential [[id number]]  (Abnormal) Collected: 10/10/19 0412     Updated: 10/10/19 0541     WBC 9.91 x10 3/uL      Hgb 11.4 g/dL      Hematocrit 08.6 %      Platelets 143 x10 3/uL      RBC 4.12 x10 6/uL      MCV 87.9 fL      MCH 27.7 pg      MCHC 31.5 g/dL      RDW 14 %      MPV 13.3 fL      Neutrophils 75.4 %      Lymphocytes Automated 18.3 %      Monocytes 5.4 %      Eosinophils Automated 0.1 %       Basophils Automated 0.4 %      Immature Granulocytes 0.4 %      Nucleated RBC 0.0 /100 WBC      Neutrophils Absolute 7.47 x10 3/uL      Lymphocytes Absolute Automated 1.81 x10 3/uL      Monocytes Absolute Automated 0.54 x10 3/uL      Eosinophils Absolute Automated 0.01 x10 3/uL      Basophils Absolute Automated 0.04 x10 3/uL      Immature Granulocytes Absolute 0.04 x10 3/uL      Absolute NRBC 0.00 x10 3/uL     Glucose Whole Blood - POCT [[id number]]  (Abnormal) Collected: 10/09/19 2347     Updated: 10/09/19 2350     Whole Blood Glucose POCT 112 mg/dL     Glucose Whole Blood - POCT [[id number]]  (Abnormal) Collected: 10/09/19 2028     Updated: 10/09/19 2033     Whole Blood Glucose POCT 123 mg/dL     Glucose Whole Blood - POCT [[id number]]  (Abnormal) Collected: 10/09/19 1727     Updated: 10/09/19 1729     Whole Blood Glucose POCT 137 mg/dL     GFR [[id number]] Collected: 10/09/19 1418     Updated: 10/09/19 1458     EGFR >60.0    Basic Metabolic Panel [[id number]]  (Abnormal) Collected: 10/09/19 1418    Specimen: Blood Updated: 10/09/19 1458     Glucose 143 mg/dL      BUN 43.3 mg/dL      Creatinine 0.9 mg/dL      Calcium 8.8 mg/dL      Sodium 295 mEq/L      Potassium 4.4 mEq/L      Chloride 100 mEq/L      CO2 26 mEq/L      Anion Gap 14.0    Glucose Whole Blood - POCT [[id number]]  (Abnormal) Collected: 10/09/19 1444     Updated: 10/09/19 1446     Whole Blood Glucose POCT 105 mg/dL     Cell MorpHology [[id number]]  (Abnormal) Collected: 10/09/19 1043     Updated: 10/09/19 1123     Cell Morphology Abnormal     Platelet Estimate Decreased     Ovalocytes Present    CBC and differential [[id number]]  (Abnormal) Collected: 10/09/19 1043    Specimen: Blood Updated: 10/09/19 1122     WBC 8.47 x10 3/uL  Hgb 12.2 g/dL      Hematocrit 16.1 %      Platelets 96 x10 3/uL      RBC 4.31 x10 6/uL      MCV 88.4 fL      MCH 28.3 pg      MCHC 32.0 g/dL      RDW 14 %      MPV 13.6 fL      Neutrophils 71.5 %      Lymphocytes Automated 22.0 %       Monocytes 5.3 %      Eosinophils Automated 0.4 %      Basophils Automated 0.4 %      Immature Granulocytes 0.4 %      Nucleated RBC 0.0 /100 WBC      Neutrophils Absolute 6.07 x10 3/uL      Lymphocytes Absolute Automated 1.86 x10 3/uL      Monocytes Absolute Automated 0.45 x10 3/uL      Eosinophils Absolute Automated 0.03 x10 3/uL      Basophils Absolute Automated 0.03 x10 3/uL      Immature Granulocytes Absolute 0.03 x10 3/uL      Absolute NRBC 0.00 x10 3/uL     Comprehensive metabolic panel [[id number]]  (Abnormal) Collected: 10/09/19 1043    Specimen: Blood Updated: 10/09/19 1110     Glucose 125 mg/dL      BUN 81.1 mg/dL      Creatinine 0.8 mg/dL      Sodium 914 mEq/L      Potassium 5.9 mEq/L      Chloride 103 mEq/L      CO2 22 mEq/L      Calcium 8.2 mg/dL      Protein, Total 7.7 g/dL      Albumin 3.9 g/dL      AST (SGOT) 30 U/L      ALT 17 U/L      Alkaline Phosphatase 81 U/L      Bilirubin, Total 0.4 mg/dL      Globulin 3.8 g/dL      Albumin/Globulin Ratio 1.0     Anion Gap 12.0    GFR [[id number]] Collected: 10/09/19 1043     Updated: 10/09/19 1110     EGFR >60.0            Micro / Labs / Path pending:     Unresulted Labs     None            Discharge Medications:     Discharge Medication List as of 10/11/2019  9:29 AM      START taking these medications    Details   amoxicillin-clavulanate (AUGMENTIN) 875-125 MG per tablet Take 1 tablet by mouth 2 (two) times daily for 7 days, Starting Fri 10/11/2019, Until Fri 10/18/2019, Normal         CONTINUE these medications which have NOT CHANGED    Details   glipiZIDE (GLUCOTROL) 10 MG tablet Take 1 tablet (10 mg total) by mouth every morning with breakfast, Starting Wed 08/21/2019, E-Rx      insulin glargine (LANTUS SOLOSTAR) 100 UNIT/ML injection pen Inject 10 units into skin daily at bedtime, Normal      linaGLIPtin (Tradjenta) 5 MG Tab Take 1 tablet (5 mg total) by mouth daily, Starting Mon 07/22/2019, E-Rx      losartan (COZAAR) 50 MG tablet Take 1 tablet (50 mg  total) by mouth nightly, Starting Tue 08/13/2019, E-Rx      simvastatin (ZOCOR) 10  MG tablet Take 10 mg by mouth every other day   , Historical Med      verapamil ER (CALAN-SR) 120 MG CR tablet Take 1 tablet (120 mg total) by mouth daily, Starting Tue 08/13/2019, E-Rx      aspirin 81 MG chewable tablet Chew 162 mg by mouth daily, Historical Med      glucose blood test strip Check sugar three times a day, ICD 10 E11.9, E-Rx      LORazepam (ATIVAN) 0.5 MG tablet Take 0.5 tablets (0.25 mg total) by mouth nightly as needed for Anxiety, Starting Thu 09/05/2019, E-Rx               Discharge Instructions:       Follow-up Information     Sabas Sous, MD .    Specialty: Family Medicine  Contact information:  [address] [place] [address]  [phone]             Talbert Nan, MD. Schedule an appointment as soon as possible for a visit in 2 weeks.    Specialty: Surgery  Contact information:  [address]  [address] [place] [address]  [phone]                   Discharge Diet: Low fiber diet            Disposition:  Home or Self Care         Patient Instructions:    Activity:  We recommend quiet activity initially.  You may walk and go up and down stairs.  Your level of discomfort will guide the amount of activity you can do.  You should avoid strenuous activity and heavy lifting greater than 20 lbs for 4 to 6 weeks.  Once you have stopped taking prescription medication and can walk without difficulty, you may drive again.    Care for the incision: If, upon discharge, your wound is well-sealed and without drainage, you may shower.  Leave the white steri-strips on the incision for at least one-week.  The steri-strips may get wet.  No soaking your abdomen for two weeks, including baths, pools, the ocean, or hot tubs.  Gently pat the incision dry after showering.  The wound does not need gauze dressing, unless you wish to apply one.    If there is some draining, or if the  wound has been left open, you will need a gauze dressing over it as it heals.      Diet:  Initially your diet may be decreased; this is normal.  We encourage you to keep up with liquids.  Begin with a light diet and avoid hard to digest foods such as broccoli, cauliflower, fatty foods, and spicy foods, as well as foods with seeds, nuts and popcorn.    Medications:  Resume all home medications as per the medication reconciliation form.  You will be given a prescription for pain medication. Take this as directed for post-operative pain.  If you are experiencing only mild discomfort, you may find over-the-counter medications, such as Tylenol (acetaminophen) or Advil/Nuprin (Ibuprofen), may be all you need for comfort.  Take stool softeners such as colace, metamucil, or sennokot while taking pain medicine to avoid constipation.    What to look for:  Frequent bowel movements or diarrhea is common.  Generally this will improve over 2 to 3 weeks after surgery.  Call our office if you do not have a bowel  movement in 48 hours despite use of stool softeners or if there is blood in your stool.  You may notice a slight drainage or redness around sutures or clips at the incision.  This is normal and not a cause for concern.  However, please call our office immediately if you develop any of the following:    -- Increased abdominal pain  -- Excessive drainage, redness or swelling at or around the incision  -- Fever over 101F  -- Persistent nausea or vomiting  -- Difficulty with urination  -- Difficulty breathing, chest pain or calf pain   -- Constipation which does not improve over 72 hours despite intervention    Follow-up:  You will be seen in our office 7 to 10 days after your surgery.  Prior to surgery, you should have made an appointment for your  post-operative visit.  If for some reason that appointment was not scheduled, please call our office at [phone] as soon as your return home to schedule your appointment.     Difficulties:  Please call us if any problems or questions arise.  We can be reached any time, including evenings and weekends, by calling our office number [phone].      Signed by: Rick Duff, MD      [facility]  [phone]

## 2019-10-18 DIAGNOSIS — E119 Type 2 diabetes mellitus without complications: Secondary | ICD-10-CM

## 2019-10-18 HISTORY — DX: Type 2 diabetes mellitus without complications: E11.9

## 2019-10-22 ENCOUNTER — Telehealth (INDEPENDENT_AMBULATORY_CARE_PROVIDER_SITE_OTHER): Payer: Self-pay

## 2019-10-22 NOTE — Progress Notes (Signed)
I informed on 10/22/2019 Dr. [name], patient's GI to follow up on small pancreatic cyst and liver lesions, possible hemangioma or benign cyst. I also informed patient's PCP Dr. 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 Clovis Riley will give message to Dr. Delman Cheadle.

## 2019-10-22 NOTE — Telephone Encounter (Signed)
Hospitalist Dr Faythe Casa called to let Dr Delman Cheadle know pt was being seen for perforated abd. When doing a CT it was found that pt has lesions on liver and pancreas. States she already let Dt Lafsky know.  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 States she spoke with one of the hematologists and they state it could be from iron deficiency. States she would like pt to be seen by Dr Delman Cheadle for further blood work, referral to hematology and further eval of lesions. Dr Delman Cheadle has been made aware.     Called pt to schedule a hospital f/u appt. Left message to call back.  Will also send to FD to call again later today

## 2019-10-25 ENCOUNTER — Encounter (INDEPENDENT_AMBULATORY_CARE_PROVIDER_SITE_OTHER): Payer: Self-pay

## 2019-10-29 ENCOUNTER — Ambulatory Visit (INDEPENDENT_AMBULATORY_CARE_PROVIDER_SITE_OTHER): Payer: Medicare Other | Admitting: Family Medicine

## 2019-10-29 ENCOUNTER — Encounter (INDEPENDENT_AMBULATORY_CARE_PROVIDER_SITE_OTHER): Payer: Self-pay | Admitting: Family Medicine

## 2019-10-29 VITALS — BP 136/89 | HR 79 | Temp 97.9°F | Resp 16 | Wt 203.0 lb

## 2019-10-29 DIAGNOSIS — I69359 Hemiplegia and hemiparesis following cerebral infarction affecting unspecified side: Secondary | ICD-10-CM

## 2019-10-29 DIAGNOSIS — K862 Cyst of pancreas: Secondary | ICD-10-CM

## 2019-10-29 DIAGNOSIS — K631 Perforation of intestine (nontraumatic): Secondary | ICD-10-CM

## 2019-10-29 DIAGNOSIS — D539 Nutritional anemia, unspecified: Secondary | ICD-10-CM

## 2019-10-29 DIAGNOSIS — D649 Anemia, unspecified: Secondary | ICD-10-CM | POA: Insufficient documentation

## 2019-10-29 DIAGNOSIS — E1165 Type 2 diabetes mellitus with hyperglycemia: Secondary | ICD-10-CM

## 2019-10-29 LAB — IRON PROFILE
Iron Saturation: 29 % (ref 15–50)
Iron: 86 ug/dL (ref 40–145)
TIBC: 293 ug/dL (ref 265–497)
UIBC: 207 ug/dL (ref 126–382)

## 2019-10-29 LAB — HEMOLYSIS INDEX: Hemolysis Index: 6 (ref 0–18)

## 2019-10-29 LAB — CBC AND DIFFERENTIAL
Absolute NRBC: 0 10*3/uL (ref 0.00–0.00)
Basophils Absolute Automated: 0.04 10*3/uL (ref 0.00–0.08)
Basophils Automated: 0.6 %
Eosinophils Absolute Automated: 0.03 10*3/uL (ref 0.00–0.44)
Eosinophils Automated: 0.4 %
Hematocrit: 42.6 % (ref 34.7–43.7)
Hgb: 12.9 g/dL (ref 11.4–14.8)
Immature Granulocytes Absolute: 0.03 10*3/uL (ref 0.00–0.07)
Immature Granulocytes: 0.4 %
Lymphocytes Absolute Automated: 1.62 10*3/uL (ref 0.42–3.22)
Lymphocytes Automated: 24 %
MCH: 26.9 pg (ref 25.1–33.5)
MCHC: 30.3 g/dL — ABNORMAL LOW (ref 31.5–35.8)
MCV: 88.8 fL (ref 78.0–96.0)
MPV: 14.1 fL — ABNORMAL HIGH (ref 8.9–12.5)
Monocytes Absolute Automated: 0.31 10*3/uL (ref 0.21–0.85)
Monocytes: 4.6 %
Neutrophils Absolute: 4.73 10*3/uL (ref 1.10–6.33)
Neutrophils: 70 %
Nucleated RBC: 0 /100 WBC (ref 0.0–0.0)
Platelets: 146 10*3/uL (ref 142–346)
RBC: 4.8 10*6/uL (ref 3.90–5.10)
RDW: 14 % (ref 11–15)
WBC: 6.76 10*3/uL (ref 3.10–9.50)

## 2019-10-29 LAB — RETICULOCYTES
Immature Retic Fract: 13.8 % (ref 1.2–15.6)
Reticulocyte Count Absolute: 0.0706 10*6/uL (ref 0.0220–0.1420)
Reticulocyte Count Automated: 1.5 % (ref 0.8–2.3)
Reticulocyte Hemoglobin: 31.3 pg (ref 28.4–40.2)

## 2019-10-29 LAB — VITAMIN B12: Vitamin B-12: 511 pg/mL (ref 211–911)

## 2019-10-29 LAB — FERRITIN: Ferritin: 245.82 ng/mL — ABNORMAL HIGH (ref 4.60–204.00)

## 2019-10-29 NOTE — Progress Notes (Signed)
Have you seen any specialists/other providers since your last visit with [place]?    Yes    Arm preference verified?   Yes    The patient is due for eye exam, foot exam, shingles vaccine and AD      Date: 10/29/2019 1:24 PM   Patient ID: Emily Rowe is a 69 y.o. female.    Chief Complaint:  Chief Complaint   Patient presents with    Hypertension       HPI:    Pt in for hospital fu  S/p colonoscopy had bowel perforation  Was in hospital few days    Seeing Dr Molli Hazard scriven- surgeon  [facility] records has been reviewed on epic  Hospital records reviewed    Feeling ok  No fever,nausea,vomiting  BM- last was 2 days ago but very little, hard pellets  Been constipation issue for long time  But now since the perforation been eating liquid to soft dieta dn avoiding may food causing more GI issue  No abd pain  Seen surgeon last week- wants wait 2 months and wants to try another colonoscopy to check    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  According to patient she has discussed this with her gastroenterologist Dr. Lasky-according to her this was discussed over the phone and they just recommended a 39-month follow-up but the last time.  Her recent CT scan did not show any change    BP very good  Following closely with Cardiology weekly and med adjusted  varapamil and losartan bid    DM - states sugar stable  Because not much so use insulin only needed  HPI       The other 14 point review systems are negative except those in HPI.    Current Medications:  Outpatient Medications Marked as Taking for the 10/29/19 encounter (Office Visit) with Sabas Sous, MD   Medication Sig Dispense Refill    amitriptyline (ELAVIL) 10 MG tablet TK 1 T PO D WITH DINNER      glipiZIDE (GLUCOTROL) 10 MG tablet Take 1 tablet (10 mg total) by mouth every morning with breakfast 90 tablet 1    glucose blood test strip Check sugar three times a day, ICD 10  E11.9 100 each 10    insulin glargine (LANTUS SOLOSTAR) 100 UNIT/ML injection pen Inject 10 units into skin daily at bedtime (Patient taking differently: Inject 10 units into skin daily at bedtime STATES ONLY TAKES IF NEEDED  ) 15 mL 0    linaGLIPtin (Tradjenta) 5 MG Tab Take 1 tablet (5 mg total) by mouth daily 90 tablet 1    LORazepam (ATIVAN) 0.5 MG tablet Take 0.5 tablets (0.25 mg total) by mouth nightly as needed for Anxiety 20 tablet 0    losartan (COZAAR) 50 MG tablet Take 1 tablet (50 mg total) by mouth nightly (Patient taking differently: Take 50 mg by mouth 2 (two) times daily   ) 90 tablet 1    simvastatin (ZOCOR) 10 MG tablet Take 10 mg by mouth every other day         verapamil ER (CALAN-SR) 120 MG CR tablet Take 1 tablet (120 mg total) by mouth daily (Patient taking differently: Take 120 mg by mouth 2 (two) times daily STATES SHE TAKES 2 DAILY ON SOME DAYS  ) 90 tablet 1       Past Medical History:  Past Medical History:  Diagnosis Date    Anxiety     Asthma     allergic reaction to mold, not since 20 years    Depression     Diabetes     Hyperlipemia     Hypertensive disorder     Hypokalemia     Neuropathy     Stroke 2012    TIA (transient ischemic attack)        Past Surgical History:  Past Surgical History:   Procedure Laterality Date    HYSTERECTOMY      REDUCTION MAMMAPLASTY      TONSILLECTOMY      urinary bladder         Family History:  Family History   Problem Relation Age of Onset    Diabetes Mother     Heart attack Mother     Diabetes Maternal Grandmother     Stroke Maternal Grandmother     Breast cancer Daughter     Stroke Maternal Grandfather        Social History:   reports that she has never smoked. She has never used smokeless tobacco. She reports that she does not drink alcohol or use drugs.    Allergies:  Allergies   Allergen Reactions    Codeine Nausea And Vomiting    Crestor [Rosuvastatin] Rash     D/c due to severe rash and higher blood sugars     Glucosamine Hives and Nausea And Vomiting    Latex Rash    Eggs Or Egg-Derived Products      Patient had an allergy test and stated that she was allergic to eggs    Gabapentin      Dizziness and rash, irregular heart rate    Invokana [Canagliflozin]     Levemir [Insulin Detemir] Nausea And Vomiting    Lyrica [Pregabalin]      PT STATES IT MAKES HER NEUROPATHY WORSE STATES SHE FEELS LIKE SHE IS ON FIRE    Metformin      diarrhea    Evaristo Bury [Insulin Degludec]     Victoza [Liraglutide] Nausea And Vomiting    Flaxseed [Linseed Oil] Rash    Omega 3 [Fish Oil] Rash    Simvastatin Rash     Patients reports able to tolerate medication and is currently taking        The following sections were reviewed this encounter by the provider:   Tobacco   Allergies   Meds   Problems   Med Hx   Surg Hx   Fam Hx          VITALS:  Vitals:    10/29/19 0929   BP: 136/89   BP Site: Left arm   Patient Position: Sitting   Cuff Size: Large   Pulse: 79   Resp: 16   Temp: 97.9 F (36.6 C)   TempSrc: Oral   SpO2: 96%   Weight: 92.1 kg (203 lb)        ROS:  General/Constitutional:   Denies Chills. Denies Fatigue. Denies Fever.   Ophthalmologic:   Denies Blurred vision. Denies Eye Pain.   ENT:   Denies Nasal Discharge. Denies Ear pain. Denies Sinus pain.   Endocrine:   Denies Polydipsia. Denies Polyuria.   Respiratory:   Denies Cough. Denies Orthopnea. Denies Shortness of breath. Denies Wheezing.   Cardiovascular:   Denies Chest pain. Denies Chest pain with exertion. Denies Leg Claudication. Denies      Palpitations. Denies Swelling in hands/feet.   Gastrointestinal:  Denies Abdominal pain. Denies Blood in stool. Denies Constipation. Denies Diarrhea.      Denies Heartburn. Denies Nausea. Denies Vomiting.   Genitourinary:   Denies Blood in urine. Denies Frequent urination. Denies Painful urination.   Musculoskeletal:   + Leg cramps. + Muscle aches.   Skin:   Denies Skin lesion(s).   Neurologic:   Denies Dizziness. +Gait abnormality.  Denies Headache. +  Tingling/Numbness.     Physical Exam:  Alert and oriented. Affect is normal and appropriate. Clear thought process with normal reasoning and conversational tone and logic. No psychomotor retardation. Not in any acute distress  Skin: no rash or abnormal lesions  Eyes - normal eye movements.  Pupils equal and reactive to light  Eyelid normal on exam  Ears - TM clear, external ear normal  Nose - no acute changes  Throat - Oral mucosa moist and pink, no abnormality noted  Neck- No thyroid enlargement noted, no palpable cervical lymph nodes  HEART: S1S2 heard, regular rate and rhythm  LUNGS: good air exchange, no crackles or ronchi or wheezing  ABDOMEN: Positive bowel sounds, no hepatospleenomegaly appreciated, no tenderness   Back: Normal curvature, no tenderness.   Extremities: FROM, no deformities, no edema, no erythema.   No knee effusion.   mild tendernesson the joint line.  Neuro: Physiological, speech better than before.   Right side weakness.   Restricted and only able to abduct <20%, Right leg weakness.   Strength RUE and RLE 3/5.         Skin: Normal, no rashes, no lesions noted.   Extremities: Warm, well perfused, no edema. Fingers stiff with some contracture L> R      Lab Results   Component Value Date    WBC 6.76 10/29/2019    HGB 12.9 10/29/2019    HCT 42.6 10/29/2019    PLT 146 10/29/2019    CHOL 188 08/21/2019    TRIG 120 08/21/2019    HDL 45 08/21/2019    LDL 119 (H) 08/21/2019    ALT 12 10/10/2019    AST 12 10/10/2019    NA 141 10/10/2019    K 3.7 10/10/2019    CL 107 10/10/2019    CREAT 0.8 10/10/2019    BUN 10.0 10/10/2019    CO2 25 10/10/2019    TSH 1.46 05/23/2019    INR 1.0 05/10/2019    GLU 95 10/10/2019    HGBA1C 7.2 (H) 10/10/2019         ASSESSMENT AND PLAN:  1. History of hemorrhagic stroke with residual hemiparesis  Chronic  Stable  With the deficit  Seeing neurology  Patient will continue statin, keep blood pressure under control and aspirin  2. Uncontrolled type 2  diabetes mellitus with hyperglycemia  Patient following up with endocrinology  Because of reduced appetite given her bowel issue patient has been titrating insulin  Advised to contact her endocrinologist  3. Perforation of sigmoid colon  - CBC and differential  - Ferritin  - Immunofixation electrophoresis, Serum  - Reticulocytes  - Vitamin B12  - IRON PROFILE  Recovered and healing better  Patient will follow up with surgeon's office  If any change in signs and symptoms should seek immediate medical help  ER precautions given  4. Pancreas cyst  - CBC and differential  - Ferritin  - Immunofixation electrophoresis, Serum  - Reticulocytes  - Vitamin B12  - IRON PROFILE  Stable the last 6 months  Patient should follow-up with her gastroenterologist-she agreed  to call Dr. Carlisle Cater office to set up a follow-up appointment  5. Anemia, unspecified type  - CBC and differential  - Ferritin  - Immunofixation electrophoresis, Serum  - Reticulocytes  - Vitamin B12  - IRON PROFILE  Recheck her panel  Poor diet currently because of her current issues    6. Nutritional anemia, unspecified   - Vitamin B12         Seen GI Dr [name] per pt about the cyst in the pancreatic tail in spetember and he called with results and told her need 6 month fu scan        FOLLOW-UP:  Return in about 3 weeks (around 11/19/2019) for fu always 30 minute.      This note was generated by the Epic EMR system/ Dragon speech recognition and may contain inherent errors or omissions not intended by the user. Grammatical errors, random word insertions, deletions, pronoun errors and incomplete sentences are occasional consequences of this technology due to software limitations. Not all errors are caught or corrected. If there are questions or concerns about the content of this note or information contained within the body of this dictation they should be addressed directly with the author for clarification      Briel Gallicchio Bernadene Person, MD

## 2019-10-30 LAB — IMMUNOFIXATION ELECTROPHORESIS

## 2019-10-30 LAB — IFE REVIEW, SERUM

## 2019-10-30 NOTE — Telephone Encounter (Signed)
Noted. See 10/29/19 OV

## 2019-11-06 ENCOUNTER — Other Ambulatory Visit: Payer: Self-pay | Admitting: Surgery

## 2019-11-06 DIAGNOSIS — R10819 Abdominal tenderness, unspecified site: Secondary | ICD-10-CM

## 2019-11-06 DIAGNOSIS — R140 Abdominal distension (gaseous): Secondary | ICD-10-CM

## 2019-11-08 ENCOUNTER — Ambulatory Visit: Payer: Medicare Other

## 2019-11-13 ENCOUNTER — Ambulatory Visit
Admission: RE | Admit: 2019-11-13 | Discharge: 2019-11-13 | Disposition: A | Discharge status: Home / Self Care | Payer: Medicare Other | Source: Ambulatory Visit | Attending: Surgery | Admitting: Surgery

## 2019-11-13 ENCOUNTER — Other Ambulatory Visit: Payer: Self-pay | Admitting: Surgery

## 2019-11-13 DIAGNOSIS — R10819 Abdominal tenderness, unspecified site: Secondary | ICD-10-CM

## 2019-11-13 DIAGNOSIS — R140 Abdominal distension (gaseous): Secondary | ICD-10-CM | POA: Insufficient documentation

## 2019-11-13 MED ORDER — SOD BICARB-CITRIC AC-SIMETH 2.21-1.53-0.04 G PO PACK
1.00 | PACK | Freq: Once | ORAL | Status: AC | PRN
Start: 2019-11-13 — End: 2019-11-13
  Administered 2019-11-13: 1 via ORAL

## 2019-11-13 MED ORDER — BARIUM SULFATE 98 % PO SUSR
150.00 mL | Freq: Once | ORAL | Status: AC | PRN
Start: 2019-11-13 — End: 2019-11-13
  Administered 2019-11-13: 150 mL via ORAL

## 2019-11-13 MED ORDER — BARIUM SULFATE 96 % PO SUSR
400.00 mL | Freq: Once | ORAL | Status: AC | PRN
Start: 2019-11-13 — End: 2019-11-13
  Administered 2019-11-13: 400 mL via ORAL

## 2019-11-14 ENCOUNTER — Encounter (INDEPENDENT_AMBULATORY_CARE_PROVIDER_SITE_OTHER): Payer: Self-pay | Admitting: Family Medicine

## 2019-11-18 ENCOUNTER — Ambulatory Visit (INDEPENDENT_AMBULATORY_CARE_PROVIDER_SITE_OTHER): Payer: Self-pay | Admitting: Family Nurse Practitioner

## 2019-11-19 ENCOUNTER — Encounter (INDEPENDENT_AMBULATORY_CARE_PROVIDER_SITE_OTHER): Payer: Self-pay | Admitting: Family Medicine

## 2019-11-19 ENCOUNTER — Ambulatory Visit (INDEPENDENT_AMBULATORY_CARE_PROVIDER_SITE_OTHER): Payer: Medicare Other | Admitting: Family Medicine

## 2019-11-19 VITALS — BP 144/89 | HR 80 | Temp 97.5°F | Resp 16 | Wt 203.8 lb

## 2019-11-19 DIAGNOSIS — K862 Cyst of pancreas: Secondary | ICD-10-CM

## 2019-11-19 DIAGNOSIS — M2550 Pain in unspecified joint: Secondary | ICD-10-CM

## 2019-11-19 DIAGNOSIS — I10 Essential (primary) hypertension: Secondary | ICD-10-CM

## 2019-11-19 DIAGNOSIS — E1165 Type 2 diabetes mellitus with hyperglycemia: Secondary | ICD-10-CM

## 2019-11-19 DIAGNOSIS — D649 Anemia, unspecified: Secondary | ICD-10-CM

## 2019-11-19 LAB — COMPREHENSIVE METABOLIC PANEL
ALT: 16 U/L (ref 0–55)
AST (SGOT): 16 U/L (ref 5–34)
Albumin/Globulin Ratio: 1.1 (ref 0.9–2.2)
Albumin: 4.1 g/dL (ref 3.5–5.0)
Alkaline Phosphatase: 93 U/L (ref 37–106)
Anion Gap: 12 (ref 5.0–15.0)
BUN: 17 mg/dL (ref 7.0–19.0)
Bilirubin, Total: 0.3 mg/dL (ref 0.2–1.2)
CO2: 25 mEq/L (ref 21–29)
Calcium: 8.9 mg/dL (ref 8.5–10.5)
Chloride: 102 mEq/L (ref 100–111)
Creatinine: 0.8 mg/dL (ref 0.4–1.5)
Globulin: 3.6 g/dL (ref 2.0–3.7)
Glucose: 197 mg/dL — ABNORMAL HIGH (ref 70–100)
Potassium: 4 mEq/L (ref 3.5–5.1)
Protein, Total: 7.7 g/dL (ref 6.0–8.3)
Sodium: 139 mEq/L (ref 136–145)

## 2019-11-19 LAB — CBC AND DIFFERENTIAL
Absolute NRBC: 0 10*3/uL (ref 0.00–0.00)
Basophils Absolute Automated: 0.05 10*3/uL (ref 0.00–0.08)
Basophils Automated: 0.6 %
Eosinophils Absolute Automated: 0.01 10*3/uL (ref 0.00–0.44)
Eosinophils Automated: 0.1 %
Hematocrit: 42.9 % (ref 34.7–43.7)
Hgb: 13.3 g/dL (ref 11.4–14.8)
Immature Granulocytes Absolute: 0.03 10*3/uL (ref 0.00–0.07)
Immature Granulocytes: 0.3 %
Lymphocytes Absolute Automated: 1.55 10*3/uL (ref 0.42–3.22)
Lymphocytes Automated: 17.6 %
MCH: 27 pg (ref 25.1–33.5)
MCHC: 31 g/dL — ABNORMAL LOW (ref 31.5–35.8)
MCV: 87.2 fL (ref 78.0–96.0)
MPV: 13.2 fL — ABNORMAL HIGH (ref 8.9–12.5)
Monocytes Absolute Automated: 0.32 10*3/uL (ref 0.21–0.85)
Monocytes: 3.6 %
Neutrophils Absolute: 6.83 10*3/uL — ABNORMAL HIGH (ref 1.10–6.33)
Neutrophils: 77.8 %
Nucleated RBC: 0 /100 WBC (ref 0.0–0.0)
Platelets: 175 10*3/uL (ref 142–346)
RBC: 4.92 10*6/uL (ref 3.90–5.10)
RDW: 14 % (ref 11–15)
WBC: 8.79 10*3/uL (ref 3.10–9.50)

## 2019-11-19 LAB — GFR: EGFR: >60

## 2019-11-19 LAB — URIC ACID: Uric acid: 4.7 mg/dL (ref 2.6–6.0)

## 2019-11-19 LAB — SEDIMENTATION RATE: Sed Rate: 18 mm/Hr (ref 0–20)

## 2019-11-19 LAB — RHEUMATOID FACTOR: Rheumatoid Factor: <15 (ref 0.0–30.0)

## 2019-11-19 LAB — C-REACTIVE PROTEIN: C-Reactive Protein: 0.9 mg/dL — ABNORMAL HIGH (ref 0.0–0.8)

## 2019-11-19 LAB — HEMOLYSIS INDEX: Hemolysis Index: 0 (ref 0–18)

## 2019-11-19 NOTE — Progress Notes (Signed)
Have you seen any specialists/other providers since your last visit with [place]?    No    Arm preference verified?   Yes    The patient is due for nothing at this time, HM is up-to-date.      Date: 11/19/2019 9:27 AM   Patient ID: Emily Rowe is a 69 y.o. female.    Chief Complaint:  Chief Complaint   Patient presents with    Diabetes Follow-up    Anemia     Follow Up       HPI:  69 year old female with diabetes, stroke, hypertension, recent hospitalization status post colonoscopy for perforation in for follow-up and reevaluation of her anemia      S/p colonoscopy had bowel perforation  Was in hospital few days    Seen Dr Molli Hazard scriven- surgeon-states that she has not seen him since the last visit  Had upper GI and an abdominal ultrasound which was reviewed on epic and was normal  According to her she was told that she will eventually have a follow-up colonoscopy    Patient is very emotional today-after hearing that her brother died of a heart attack in [place] this morning.  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  According to patient she has discussed this with her gastroenterologist Dr. Lasky-according to her this was discussed over the phone and they just recommended a 43-month follow-up but the last time.  Her recent CT scan did not show any change      Following closely with Cardiology weekly   varapamil and losartan bid    DM - states sugar stable      The other 14 point review systems are negative except those in HPI.    Current Medications:  Outpatient Medications Marked as Taking for the 11/19/19 encounter (Office Visit) with Rebecca Eaton, MD   Medication Sig Dispense Refill    amitriptyline (ELAVIL)  10 MG tablet TK 1 T PO D WITH DINNER      glipiZIDE (GLUCOTROL) 10 MG tablet Take 1 tablet (10 mg total) by mouth every morning with breakfast 90 tablet 1    glucose blood test strip Check sugar three times a day, ICD 10 E11.9 100 each 10    linaGLIPtin (Tradjenta) 5 MG Tab Take 1 tablet (5 mg total) by mouth daily 90 tablet 1    losartan (COZAAR) 50 MG tablet Take 1 tablet (50 mg total) by mouth nightly (Patient taking differently: Take 50 mg by mouth 2 (two) times daily   ) 90 tablet 1    simvastatin (ZOCOR) 10 MG tablet Take 10 mg by mouth every other day         verapamil ER (CALAN-SR) 120 MG CR tablet Take 1 tablet (120 mg total) by mouth daily (Patient taking differently: Take 120 mg by mouth 2 (two) times daily STATES SHE TAKES 2 DAILY ON SOME DAYS  ) 90 tablet 1       Past Medical History:  Past Medical History:   Diagnosis Date    Anxiety     Asthma     allergic reaction to mold, not since 20 years  Depression     Diabetes     Hyperlipemia     Hypertensive disorder     Hypokalemia     Neuropathy     Stroke 2012    TIA (transient ischemic attack)        Past Surgical History:  Past Surgical History:   Procedure Laterality Date    HYSTERECTOMY      REDUCTION MAMMAPLASTY      TONSILLECTOMY      urinary bladder         Family History:  Family History   Problem Relation Age of Onset    Diabetes Mother     Heart attack Mother     Diabetes Maternal Grandmother     Stroke Maternal Grandmother     Breast cancer Daughter     Stroke Maternal Grandfather        Social History:   reports that she has never smoked. She has never used smokeless tobacco. She reports that she does not drink alcohol or use drugs.    Allergies:  Allergies   Allergen Reactions    Codeine Nausea And Vomiting    Crestor [Rosuvastatin] Rash     D/c due to severe rash and higher blood sugars    Glucosamine Hives and Nausea And Vomiting    Latex Rash    Eggs Or Egg-Derived Products      Patient had an allergy test  and stated that she was allergic to eggs    Gabapentin      Dizziness and rash, irregular heart rate    Invokana [Canagliflozin]     Levemir [Insulin Detemir] Nausea And Vomiting    Lyrica [Pregabalin]      PT STATES IT MAKES HER NEUROPATHY WORSE STATES SHE FEELS LIKE SHE IS ON FIRE    Metformin      diarrhea    Evaristo Bury [Insulin Degludec]     Victoza [Liraglutide] Nausea And Vomiting    Flaxseed [Linseed Oil] Rash    Omega 3 [Fish Oil] Rash    Simvastatin Rash     Patients reports able to tolerate medication and is currently taking        The following sections were reviewed this encounter by the provider:   Tobacco   Allergies   Meds   Problems   Med Hx   Surg Hx   Fam Hx          VITALS:  Vitals:    11/19/19 0857 11/19/19 0906   BP: 167/89 144/89   BP Site: Left arm    Patient Position: Sitting    Cuff Size: Large    Pulse: 80    Resp: 16    Temp: 97.5 F (36.4 C)    TempSrc: Tympanic    SpO2: 98%    Weight: 92.4 kg (203 lb 12.8 oz)         ROS:  General/Constitutional:   Denies Chills. Denies Fatigue. Denies Fever.   Ophthalmologic:   Denies Blurred vision. Denies Eye Pain.   ENT:   Denies Nasal Discharge. Denies Ear pain. Denies Sinus pain.   Endocrine:   Denies Polydipsia. Denies Polyuria.   Respiratory:   Denies Cough. Denies Orthopnea. Denies Shortness of breath. Denies Wheezing.   Cardiovascular:   Denies Chest pain. Denies Chest pain with exertion. Denies Leg Claudication. Denies      Palpitations. Denies Swelling in hands/feet.   Gastrointestinal:   Denies Abdominal pain. Denies Blood in stool. Denies Constipation. Denies Diarrhea.  Denies Heartburn. Denies Nausea. Denies Vomiting.   Genitourinary:   Denies Blood in urine. Denies Frequent urination. Denies Painful urination.   Musculoskeletal:   + Leg cramps. + Muscle aches.   Skin:   Denies Skin lesion(s).   Neurologic:   Denies Dizziness. +Gait abnormality. Denies Headache. +  Tingling/Numbness.     Physical Exam:  Alert and oriented.  Affect is normal and appropriate. Clear thought process with normal reasoning and conversational tone and logic. No psychomotor retardation. Not in any acute distress  Skin: no rash or abnormal lesions  Eyes - normal eye movements.  Pupils equal and reactive to light  Eyelid normal on exam  Ears - TM clear, external ear normal  Nose - no acute changes  Throat - Oral mucosa moist and pink, no abnormality noted  Neck- No thyroid enlargement noted, no palpable cervical lymph nodes  HEART: S1S2 heard, regular rate and rhythm  LUNGS: good air exchange, no crackles or ronchi or wheezing  ABDOMEN: Positive bowel sounds, no hepatospleenomegaly appreciated, no tenderness   Back: Normal curvature, no tenderness.   Extremities: FROM, no deformities, no edema, no erythema.   No knee effusion.   mild tendernesson the joint line.  Neuro: Physiological, speech better than before.   Right side weakness.   Restricted and only able to abduct <20%, Right leg weakness.   Strength RUE and RLE 3/5.         Skin: Normal, no rashes, no lesions noted.   Extremities: Warm, well perfused, no edema. Fingers stiff with some contracture L> R      Lab Results   Component Value Date    WBC 6.76 10/29/2019    HGB 12.9 10/29/2019    HCT 42.6 10/29/2019    PLT 146 10/29/2019    CHOL 188 08/21/2019    TRIG 120 08/21/2019    HDL 45 08/21/2019    LDL 119 (H) 08/21/2019    ALT 12 10/10/2019    AST 12 10/10/2019    NA 141 10/10/2019    K 3.7 10/10/2019    CL 107 10/10/2019    CREAT 0.8 10/10/2019    BUN 10.0 10/10/2019    CO2 25 10/10/2019    TSH 1.46 05/23/2019    INR 1.0 05/10/2019    GLU 95 10/10/2019    HGBA1C 7.2 (H) 10/10/2019       Assessment/Plan:       1. Anemia, unspecified type  Signs and symptoms are improved  Patient's last hemoglobin stable at 1.9  Increase to eating green leafy vegetables and iron rich stuff  2. Uncontrolled type 2 diabetes mellitus with hyperglycemia  - CBC and differential  - Comprehensive metabolic panel  - ANA IFA  w/rflx to Titer/Pat/Multiplex 11  - C Reactive Protein  - Rheumatoid factor  - Sedimentation rate (ESR)  - Cyclic citrul peptide antibody, IgG  - Uric acid  Stable with a hemoglobin A1c of 7.2  She is up-to-date on ophthalmic eye exam    3. Essential hypertension  - CBC and differential  - Comprehensive metabolic panel  - ANA IFA w/rflx to Titer/Pat/Multiplex 11  - C Reactive Protein  - Rheumatoid factor  - Sedimentation rate (ESR)  - Cyclic citrul peptide antibody, IgG  - Uric acid  With elevated systolic blood pressure  As in HPI she just lost her brother and is emotionally trot  Advised monitor blood pressure  To call us if she has any concerns or question  4. Pancreas  cyst    Stable  Followed by her gastroenterologist  5. Multiple joint pain  - CBC and differential  - Comprehensive metabolic panel  - ANA IFA w/rflx to Titer/Pat/Multiplex 11  - C Reactive Protein  - Rheumatoid factor  - Sedimentation rate (ESR)  - Cyclic citrul peptide antibody, IgG  - Uric acid  Rheumatology Dr. Otho Bellows consult note reviewed  She is due to have all these blood work-since she is here I am drawing these blood work and we will send it out to her once received      Return in about 2 months (around 01/17/2020) for Follow-up in 2 months-always 30 minutes-high risk for Covid 19, please put her on the list.    This note was generated by the Epic EMR system/ Dragon speech recognition and may contain inherent errors or omissions not intended by the user. Grammatical errors, random word insertions, deletions, pronoun errors and incomplete sentences are occasional consequences of this technology due to software limitations. Not all errors are caught or corrected. If there are questions or concerns about the content of this note or information contained within the body of this dictation they should be addressed directly with the author for clarification      Rebecca Eaton, MD

## 2019-11-20 ENCOUNTER — Encounter (INDEPENDENT_AMBULATORY_CARE_PROVIDER_SITE_OTHER): Payer: Self-pay | Admitting: Family Medicine

## 2019-11-20 LAB — CYCLIC CITRUL PEPTIDE ANTIBODY, IGG: Cyclic Citrullinated Peptide AB: <15.6 U

## 2019-11-21 ENCOUNTER — Encounter (INDEPENDENT_AMBULATORY_CARE_PROVIDER_SITE_OTHER): Payer: Self-pay | Admitting: Internal Medicine

## 2019-11-21 ENCOUNTER — Ambulatory Visit (INDEPENDENT_AMBULATORY_CARE_PROVIDER_SITE_OTHER): Payer: Medicare Other | Admitting: Internal Medicine

## 2019-11-21 VITALS — BP 145/88 | HR 71 | Temp 97.6°F | Ht 65.0 in | Wt 204.0 lb

## 2019-11-21 DIAGNOSIS — E1165 Type 2 diabetes mellitus with hyperglycemia: Secondary | ICD-10-CM

## 2019-11-21 DIAGNOSIS — E782 Mixed hyperlipidemia: Secondary | ICD-10-CM

## 2019-11-21 DIAGNOSIS — E669 Obesity, unspecified: Secondary | ICD-10-CM

## 2019-11-21 DIAGNOSIS — I10 Essential (primary) hypertension: Secondary | ICD-10-CM

## 2019-11-21 DIAGNOSIS — Z6833 Body mass index (BMI) 33.0-33.9, adult: Secondary | ICD-10-CM

## 2019-11-21 MED ORDER — GLIPIZIDE 10 MG PO TABS
10.0000 mg | ORAL_TABLET | Freq: Every morning | ORAL | 1 refills | Status: DC
Start: 2019-11-21 — End: 2019-12-20

## 2019-11-21 MED ORDER — TRADJENTA 5 MG PO TABS
1.0000 | ORAL_TABLET | Freq: Every day | ORAL | 1 refills | Status: DC
Start: 2019-11-21 — End: 2020-02-16

## 2019-11-21 MED ORDER — LANTUS SOLOSTAR 100 UNIT/ML SC SOPN
PEN_INJECTOR | SUBCUTANEOUS | 0 refills | Status: DC
Start: 2019-11-21 — End: 2020-03-23

## 2019-11-21 NOTE — Progress Notes (Signed)
Subjective:      Date: 11/21/2019 9:41 AM   Patient ID: Emily Rowe is a 69 y.o. female.    Chief Complaint:  Chief Complaint   Patient presents with    Uncontrolled type 2 diabetes mellitus with hyperglycemia     Printed rx for glipizide     Verbal consent has been obtained from the patient to conduct a video and telephone visit to minimize exposure to COVID-19: YES    HPI  Visit type: follow-up - type II DM   Diagnosed: over 5 years ago  Type: Insulin requiring  Complications: hyperglycemia  Control: inadequate control  Comorbid illness: hyperlipidemia  Last follow-up: 6 months ago    Diabetes Medication Regimen: Glipizide XL 10 mg daily  , Tradjenta 5 mg daily , Lantus 10 units daily     Unable to tolerate Metformin due to diarrhea, unable to tolerate Victoza due to n/v, unable to tolerate Invokana. Taking potassium pill daily along with eating sweet potato and avocado everyday. Unable to tolerate Levemir due to episodes of nausea and vomiting. Not able to tolerate tresiba.  Medication effectiveness/adherence: working well and stopped taking Levemir   Medication side effects: none  Interval events: no interval events   Interval symptoms: none  Patient denies: no other symptoms including chest pain, shortness of breath, hypoglycemic episodes, polydipsia, polyuria, headaches, dizziness, or abdominal pain  Home glucose readings:  FBG 99-100, 2H PPBG 134-140 mg/dl  Daily basal insulin dosing: Levemir (stopped taking 1 month ago)   Daily bolus insulin dosing: non-insulin requiring  Diabetic screening: yesterday, January 02, 2018, no known retinopathy  Podiatric assessment: admits LE burning/tingling due to hx of stroke   Exercise: walks occasionally   Diet: moderate compliance with recommended diet; eats more vegetables and protein   Response to therapy: with hemoglobin A1C above goal  A1c trend: improving     Lab Results   Component Value Date    HGBA1C 7.2 (H) 10/10/2019    HGBA1C 7.0 (A) 08/13/2019     HGBA1C 7.4 (H) 05/11/2019     Trend is decreasing  Additional concerns: In addition, pt has hyperlipidemia and is compliant with lipid therapy.  Pt denies side effects of lipid therapy - specifically denies myalgia., In addition, pt has stable essential HTN with no evidence of CHF or proteinuria.  The patient is compliant with anti-hypertensive therapy and denies side effects to therapy.  Pt denies CP, SOB, dizziness, orthopnea, PND or edema.     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     H/o severe hypokalemia which required ER visit; had been off lisinopril at that time.  Notes allergic reaction soon after taking crestor.      Interval history: Elements of HPI were reviewed and updated. Reports consistent compliance with the listed medications for diabetes, hypertension and hyperlipidemia.  Weight has been stable. Had colonic perforation following procedure in 12/20. BG reading last night was 141 mg/dl. FBG ranges around 108 mg/dl in the last week. Her brother recently passed away and reports interval increase in stress as a result.    ROS:  A complete 12 point ROS was obtained. Pertinent positives and negatives as noted in HPI. All other systems are negative.      Vitals:  BP 145/88    Pulse 71    Temp 97.6 F (36.4 C)    Ht 1.651 m (5\' 5" )    Wt 92.5 kg (204 lb)  BMI 33.95 kg/m    Wt Readings from Last 3 Encounters:   11/21/19 92.5 kg (204 lb)   11/19/19 92.4 kg (203 lb 12.8 oz)   10/29/19 92.1 kg (203 lb)          Physical Exam   Constitutional: She is oriented to person, place, and time. She appears well-developed and well-nourished.   HENT:   Head: Normocephalic and atraumatic.   Mouth/Throat: Oropharynx is clear and moist.   Eyes: Conjunctivae and EOM are normal.    Neck: Normal range of motion. No thyromegaly present.   Lymphadenopathy: She has no cervical adenopathy.   Neurological: She is alert and oriented to person, place, and time.   Psychiatric:  She has a normal mood and affect.     Complete physical exam is limited by visit type: televisit    Assessment and Plan     Diagnoses and plan were reviewed and updated    1. Uncontrolled type 2 diabetes mellitus with hyperglycemia  Lab Results   Component Value Date    HGBA1C 7.2 (H) 10/10/2019    HGBA1C 7.0 (A) 08/13/2019    HGBA1C 7.4 (H) 05/11/2019       Current A1C is elevated above goal and trend has not changed compared to prior values..  Recommend optimizing low carbohydrate diet efforts and drinking at least 60 ounces of H20 daily.  Exercise goal - 150 minutes of sustained cardio exercise per week. Recommend home glucometer testing AC and HS.  Retinopathy surveillance is UTD. Podiatry surveillance is UTD. Microalbumin testing is UTD within past year.    Given h/o intolerance to tresiba, toujeo, basaglar. Lantus has been well tolerated. Significant interval improvement in glycemic control on the current medication regimen- Lantus, glipizide, tradjenta.    - Hemoglobin A1C  - Comprehensive metabolic panel  - Lipid panel  - TSH  - Urine Microalbumin Random  - insulin glargine (LANTUS SOLOSTAR) 100 UNIT/ML injection pen; Inject 10 units into skin daily at bedtime  Dispense: 15 mL; Refill: 0    2. Benign hypertension  BP is well controlled on the current meds.    3. Mixed hyperlipidemia  Lab Results   Component Value Date    CHOL 188 08/21/2019    CHOL 226 (H) 05/23/2019    CHOL 251 (H) 10/11/2018     Lab Results   Component Value Date    HDL 45 08/21/2019    HDL 41 05/23/2019    HDL 46 10/11/2018     Lab Results   Component Value Date    LDL 119 (H) 08/21/2019    LDL 160 (H) 05/23/2019    LDL 182 (H) 10/11/2018     Lab Results   Component Value Date    TRIG 120 08/21/2019    TRIG 125 05/23/2019    TRIG 114 10/11/2018       On statins; well tolerated.  - Lipid panel    4. Obesity (BMI 30-39.9)  Recommend calorie restricted diet and exercise for weight loss.      5. Body mass index 34.0-34.9, adult    This visit  is being conducted via video and telephone. Time spent in discussion: 25 minutes          RTC in 5 months    Tanya Nones MD MPH  Endocrinologist, [facility]

## 2019-11-25 ENCOUNTER — Encounter (INDEPENDENT_AMBULATORY_CARE_PROVIDER_SITE_OTHER): Payer: Self-pay

## 2019-11-28 LAB — CASCADING REFLEX FOR ANAQQ
Chromatin (Nucleosomal) Antibody: <1
DNA (ds) Double Stranded Antibody: 1 (ref <=4)
RNP Antibody: <1
Sm Antibody: <1
Sm/RNP Antibody: <1

## 2019-11-28 LAB — STAGE II (REFLEX)
J0-Antibody: <1
Sjogren's Antibody (SS-A): <1
Sjogren's Antibody (SS-B): <1
scl-70 Antibody: <1

## 2019-11-28 LAB — STAGE III (REFLEX)
Centromere B Antibody: <1
Ribosomal P Antibody: <1

## 2019-11-28 LAB — ANA TITER AND PATTERN FOR QUEST: ANA Titer: 1:40 {titer} — AB

## 2019-11-28 LAB — ANA IFA W/REFLEX TO TITER/PATTERN/AB: ANA Screen, IFA: POSITIVE — AB

## 2019-11-28 LAB — REFLEX ANA CASCADING INTERPRETATION

## 2019-12-06 ENCOUNTER — Ambulatory Visit (INDEPENDENT_AMBULATORY_CARE_PROVIDER_SITE_OTHER): Payer: Medicare Other | Admitting: Family Medicine

## 2019-12-18 ENCOUNTER — Encounter (HOSPITAL_BASED_OUTPATIENT_CLINIC_OR_DEPARTMENT_OTHER): Payer: Self-pay

## 2019-12-18 NOTE — Progress Notes (Signed)
[facility] Behavior[facility] Health Prescreen (Psychiatry and Therapy)      Patients Name (Always update Registration for Patient):Emily Natsuki Everett      Do you require Hard of Hearing Services? no        Reason for Referral: Why are you seeking services: pt states "My primary care doctor recommended because I keep having a panic attack every night and sometimes three times a night."  (Therapy services or Psychiatry services -Please elaborate on reason for referral; Referral source)       Are you seeking treatment for any substance use?no  If Yes, Explain:   (Note: Recommend CATS services or REFER OUT IF APPROPRIATE)        Are you currently experiencing Suicidal Ideation with a plan or intent? no   If Yes, explain:  Note:REFER OUT IF APPROPRIATE      Are you currently experiencing Homicidal Ideation with a plan or intent? no   If Yes, explain:  Note:REFER OUT IF APPROPRIATE      Additional Comment:       Recommended Level of Care:Psychiatry    Patient scheduled/wait listed [id number]      Integrated Services:no  Referral to IPAC: yes       Waitlist for Therapy: no    If Wait listed for [employer], Name of Provider:        Reminders:   If ADHD/or ADD: Please note that it is required that you bring any testing results or supporting documentation to your appointment to support ADHD/OR ADD diagnosis. Supporting documents maybe be required in order for  stimulants to be prescribed   If requesting specific medications: Note that prescription of any medication is based on the outcome of the providers assessment    Please bring a list of all prescription medication to include dosages to your scheduled appointment   We ask that you give [place] at least 24 hour notice if you need to cancel or reschedule your appointment.   If you feel the need for urgent care, before your scheduled appointment, please seek emergency care immediately.     NOTE:   If a patient requires Landscape architect; Document in appointment  desk that language services is needed and also email provider   If a Pt is requesting non-clinical documentation (i.e. Disability evaluation), please refer to the following information:  Our mental health services are therapeutic in nature and assessment is limited to clinical evaluation and treatment.  Assessments that do not fall under [facility] scope of services include:  ? Court-ordered compliance   ? Evidence of disability   ? Court mandated services resulting in the provider testifying on your behalf  ? Expert witnesses in court cases  ? Impressions for or fitness for custody   ? Evaluations for emotional support animals

## 2019-12-20 ENCOUNTER — Other Ambulatory Visit (INDEPENDENT_AMBULATORY_CARE_PROVIDER_SITE_OTHER): Payer: Self-pay

## 2019-12-20 ENCOUNTER — Other Ambulatory Visit (INDEPENDENT_AMBULATORY_CARE_PROVIDER_SITE_OTHER): Payer: Self-pay | Admitting: Family Medicine

## 2019-12-20 DIAGNOSIS — E1165 Type 2 diabetes mellitus with hyperglycemia: Secondary | ICD-10-CM

## 2019-12-20 NOTE — Telephone Encounter (Signed)
Received fax from [employer] .    LV 11/21/19  UV 03/26/20

## 2019-12-21 ENCOUNTER — Other Ambulatory Visit (INDEPENDENT_AMBULATORY_CARE_PROVIDER_SITE_OTHER): Payer: Self-pay | Admitting: Family Medicine

## 2019-12-23 ENCOUNTER — Encounter (INDEPENDENT_AMBULATORY_CARE_PROVIDER_SITE_OTHER): Payer: Self-pay

## 2019-12-23 MED ORDER — GLIPIZIDE 10 MG PO TABS
10.0000 mg | ORAL_TABLET | Freq: Every morning | ORAL | 1 refills | Status: DC
Start: 2019-12-23 — End: 2020-02-17

## 2019-12-27 ENCOUNTER — Telehealth (INDEPENDENT_AMBULATORY_CARE_PROVIDER_SITE_OTHER): Payer: Self-pay

## 2019-12-27 DIAGNOSIS — E1165 Type 2 diabetes mellitus with hyperglycemia: Secondary | ICD-10-CM

## 2019-12-27 MED ORDER — GLIPIZIDE ER 10 MG PO TB24
10.00 mg | ORAL_TABLET | Freq: Every day | ORAL | 1 refills | Status: DC
Start: 2019-12-27 — End: 2020-03-29

## 2019-12-27 NOTE — Telephone Encounter (Signed)
[place] from [facility] cal requesting to change Rx to [employer] XL per patient's request. Patient said she has always been on the XL . Please send in new Rx for Glipizide XL [facility].

## 2019-12-27 NOTE — Telephone Encounter (Signed)
Rx sent 

## 2020-01-07 ENCOUNTER — Other Ambulatory Visit (INDEPENDENT_AMBULATORY_CARE_PROVIDER_SITE_OTHER): Payer: Self-pay | Admitting: Family Medicine

## 2020-01-07 DIAGNOSIS — I10 Essential (primary) hypertension: Secondary | ICD-10-CM

## 2020-01-08 ENCOUNTER — Other Ambulatory Visit: Payer: Self-pay | Admitting: Gastroenterology

## 2020-01-08 DIAGNOSIS — K862 Cyst of pancreas: Secondary | ICD-10-CM

## 2020-01-10 ENCOUNTER — Other Ambulatory Visit (INDEPENDENT_AMBULATORY_CARE_PROVIDER_SITE_OTHER): Payer: Self-pay

## 2020-01-16 ENCOUNTER — Other Ambulatory Visit (INDEPENDENT_AMBULATORY_CARE_PROVIDER_SITE_OTHER): Payer: Self-pay | Admitting: Family Medicine

## 2020-01-21 ENCOUNTER — Ambulatory Visit: Admission: RE | Admit: 2020-01-21 | Payer: Medicare Other | Source: Ambulatory Visit

## 2020-01-24 ENCOUNTER — Encounter (INDEPENDENT_AMBULATORY_CARE_PROVIDER_SITE_OTHER): Payer: Self-pay

## 2020-01-31 ENCOUNTER — Other Ambulatory Visit (INDEPENDENT_AMBULATORY_CARE_PROVIDER_SITE_OTHER): Payer: Self-pay | Admitting: Family Medicine

## 2020-02-04 ENCOUNTER — Other Ambulatory Visit (INDEPENDENT_AMBULATORY_CARE_PROVIDER_SITE_OTHER): Payer: Self-pay | Admitting: Family Medicine

## 2020-02-06 ENCOUNTER — Ambulatory Visit
Admission: RE | Admit: 2020-02-06 | Discharge: 2020-02-06 | Disposition: A | Discharge status: Home / Self Care | Payer: Medicare Other | Source: Ambulatory Visit | Attending: Gastroenterology | Admitting: Gastroenterology

## 2020-02-06 DIAGNOSIS — K862 Cyst of pancreas: Secondary | ICD-10-CM | POA: Insufficient documentation

## 2020-02-06 LAB — WHOLE BLOOD CREATININE WITH GFR POCT
GFR POCT: >60 mL/min/{1.73_m2} (ref >60)
Whole Blood Creatinine POCT: 0.5 mg/dL (ref 0.5–1.1)

## 2020-02-06 MED ORDER — GADOBUTROL 1 MMOL/ML IV SOLN
10.00 mL | Freq: Once | INTRAVENOUS | Status: AC | PRN
Start: 2020-02-06 — End: 2020-02-06
  Administered 2020-02-06: 10 mmol via INTRAVENOUS
  Filled 2020-02-06: qty 10

## 2020-02-07 ENCOUNTER — Encounter: Payer: Self-pay | Admitting: Gastroenterology

## 2020-02-16 ENCOUNTER — Other Ambulatory Visit (INDEPENDENT_AMBULATORY_CARE_PROVIDER_SITE_OTHER): Payer: Self-pay | Admitting: Internal Medicine

## 2020-02-16 DIAGNOSIS — E1165 Type 2 diabetes mellitus with hyperglycemia: Secondary | ICD-10-CM

## 2020-02-17 ENCOUNTER — Encounter (INDEPENDENT_AMBULATORY_CARE_PROVIDER_SITE_OTHER): Payer: Self-pay | Admitting: Family Medicine

## 2020-02-17 ENCOUNTER — Ambulatory Visit (INDEPENDENT_AMBULATORY_CARE_PROVIDER_SITE_OTHER): Payer: Medicare Other | Admitting: Family Medicine

## 2020-02-17 VITALS — BP 138/82 | HR 81 | Temp 98.0°F | Resp 16 | Wt 207.0 lb

## 2020-02-17 DIAGNOSIS — R42 Dizziness and giddiness: Secondary | ICD-10-CM

## 2020-02-17 DIAGNOSIS — I639 Cerebral infarction, unspecified: Secondary | ICD-10-CM

## 2020-02-17 DIAGNOSIS — E1142 Type 2 diabetes mellitus with diabetic polyneuropathy: Secondary | ICD-10-CM

## 2020-02-17 DIAGNOSIS — E119 Type 2 diabetes mellitus without complications: Secondary | ICD-10-CM

## 2020-02-17 DIAGNOSIS — E1165 Type 2 diabetes mellitus with hyperglycemia: Secondary | ICD-10-CM

## 2020-02-17 DIAGNOSIS — Z794 Long term (current) use of insulin: Secondary | ICD-10-CM

## 2020-02-17 DIAGNOSIS — E782 Mixed hyperlipidemia: Secondary | ICD-10-CM

## 2020-02-17 DIAGNOSIS — K862 Cyst of pancreas: Secondary | ICD-10-CM

## 2020-02-17 DIAGNOSIS — I10 Essential (primary) hypertension: Secondary | ICD-10-CM

## 2020-02-17 LAB — POCT HEMOGLOBIN A1C: POCT Hgb A1C: 7.2 % — AB (ref 3.9–5.9)

## 2020-02-17 MED ORDER — SIMVASTATIN 10 MG PO TABS
10.0000 mg | ORAL_TABLET | ORAL | 0 refills | Status: DC
Start: 2020-02-17 — End: 2020-08-24

## 2020-02-17 NOTE — Progress Notes (Signed)
Date: 02/17/2020 12:40 PM   Patient ID: Emily Rowe is a 69 y.o. female.    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  According to patient she has discussed this with her gastroenterologist Dr. Lasky-according to her this was discussed over the phone and they just recommended a 67-month follow-up but the last time.  Got MRI abd done  IMPRESSION:    Small cystic pancreatic lesion has similar size compared to  previous exam performed in September 2020. Recommend continued imaging  surveillance.    Wynema Birch, MD   02/06/2020 1:26 PM  Should discuss results with GI discussed        Following closely with Cardiology weekly   Hypertension-she usually had a fluctuating blood pressure and was taking medication differently to get it under control  Currently she is taking verapamil 120 mg in the morning and losartan 50 mg in the evening  States her blood pressure has been very stable    DM - states sugar stable  Sees her endocrinologist every 6 months    Chronic neuropathy since her stroke    The other 14 point review systems are negative except those in HPI.    Current Medications:  Outpatient Medications Marked as Taking for the 02/17/20 encounter (Office Visit) with Rebecca Eaton, MD   Medication Sig Dispense Refill    aspirin 81 MG chewable tablet Chew 81 mg by mouth daily TAKES SOME DAYS        glipiZIDE  (GLUCOTROL) 10 MG 24 hr tablet Take 1 tablet (10 mg total) by mouth daily 90 tablet 1    glucose blood test strip Check sugar three times a day, ICD 10 E11.9 100 each 10    insulin glargine (LANTUS SOLOSTAR) 100 UNIT/ML injection pen Inject 10 units into skin daily at bedtime (Patient taking differently: as needed Inject 10 units into skin daily at bedtime AS NEEDED  ) 15 mL 0    LORazepam (ATIVAN) 0.5 MG tablet Take 0.5 tablets (0.25 mg total) by mouth nightly as needed for Anxiety 20 tablet 0    losartan (COZAAR) 50 MG tablet Take 1 tablet (50 mg total) by mouth nightly (Patient taking differently: Take 50 mg by mouth daily   ) 90 tablet 1    simvastatin (ZOCOR) 10 MG tablet Take 1 tablet (10 mg total) by mouth every other day 90 tablet 0    Tradjenta 5 MG Tab TAKE 1 TABLET(5 MG) BY MOUTH DAILY 90 tablet 1    verapamil ER (CALAN-SR) 120 MG CR tablet TAKE 1 TABLET(120 MG) BY MOUTH DAILY 90 tablet 1    [DISCONTINUED] simvastatin (ZOCOR) 10 MG tablet Take 10 mg by mouth every other day            Past Medical History:  Past Medical History:   Diagnosis Date    Anxiety     Asthma     allergic reaction to mold, not since 20 years    Depression     Diabetes     Hyperlipemia     Hypertensive disorder     Hypokalemia     Neuropathy     Stroke 2012    TIA (transient ischemic attack)        Past Surgical History:  Past Surgical History:   Procedure Laterality Date    HYSTERECTOMY      REDUCTION MAMMAPLASTY      TONSILLECTOMY      urinary bladder         Family History:  Family History   Problem Relation Age of Onset    Diabetes Mother     Heart attack Mother     Diabetes Maternal Grandmother     Stroke Maternal Grandmother     Breast cancer Daughter     Stroke Maternal Grandfather        Social History:   reports that she has never smoked. She has never used smokeless tobacco. She reports that she does not drink alcohol and does not use drugs.    Allergies:  Allergies   Allergen Reactions     Codeine Nausea And Vomiting    Crestor [Rosuvastatin] Rash     D/c due to severe rash and higher blood sugars    Glucosamine Hives and Nausea And Vomiting    Latex Rash    Eggs Or Egg-Derived Products      Patient had an allergy test and stated that she was allergic to eggs    Gabapentin      Dizziness and rash, irregular heart rate    Invokana [Canagliflozin]     Levemir [Insulin Detemir] Nausea And Vomiting    Lyrica [Pregabalin]      PT STATES IT MAKES HER NEUROPATHY WORSE STATES SHE FEELS LIKE SHE IS ON FIRE    Metformin      diarrhea    Evaristo Bury [Insulin Degludec]     Victoza [Liraglutide] Nausea And Vomiting    Flaxseed [Linseed Oil] Rash    Omega 3 [Fish Oil] Rash    Peanut-Containing Drug Products Rash       The following sections were reviewed this encounter by the provider:   Tobacco   Allergies   Meds   Problems   Med Hx   Surg Hx   Fam Hx          VITALS:  Vitals:    02/17/20 1103   BP: 138/82   BP Site: Left arm   Patient Position: Sitting   Cuff Size: Large   Pulse: 81   Resp: 16   Temp: 98 F (36.7 C)   TempSrc: Oral   SpO2: 96%   Weight: 93.9 kg (207 lb)        ROS:  General/Constitutional:   Denies Chills. Denies Fatigue. Denies Fever.   Ophthalmologic:   Denies Blurred vision. Denies Eye Pain.   ENT:   Denies Nasal Discharge. Denies Ear pain. Denies Sinus pain.   Endocrine:   Denies Polydipsia. Denies Polyuria.   Respiratory:   Denies Cough. Denies Orthopnea. Denies Shortness of breath. Denies Wheezing.   Cardiovascular:   Denies Chest pain. Denies Chest pain with exertion. Denies Leg Claudication. Denies      Palpitations. Denies Swelling in hands/feet.   Gastrointestinal:   Denies Abdominal pain. Denies Blood in  stool. Denies Constipation. Denies Diarrhea.      Denies Heartburn. Denies Nausea. Denies Vomiting.   Genitourinary:   Denies Blood in urine. Denies Frequent urination. Denies Painful urination.   Musculoskeletal:   + Leg cramps. + Muscle aches.   Skin:   Denies Skin  lesion(s).   Neurologic:   Denies Dizziness. +Gait abnormality. Denies Headache. +  Tingling/Numbness.     Physical Exam:  Alert and oriented. Affect is normal and appropriate. Clear thought process with normal reasoning and conversational tone and logic. No psychomotor retardation. Not in any acute distress  Skin: no rash or abnormal lesions  Eyes - normal eye movements.  Pupils equal and reactive to light  Eyelid normal on exam  Ears - TM clear, external ear normal  Nose - no acute changes  Throat - Oral mucosa moist and pink, no abnormality noted  Neck- No thyroid enlargement noted, no palpable cervical lymph nodes  HEART: S1S2 heard, regular rate and rhythm  LUNGS: good air exchange, no crackles or ronchi or wheezing  ABDOMEN: Positive bowel sounds, no hepatospleenomegaly appreciated, no tenderness   Back: Normal curvature, no tenderness.   Extremities: FROM, no deformities, no edema, no erythema.   No knee effusion.   mild tendernesson the joint line.  Neuro: Physiological, speech better than before.   Right side weakness.   Restricted and only able to abduct <20%, Right leg weakness.   Strength RUE and RLE 3/5.         Skin: Normal, no rashes, no lesions noted.   Extremities: Warm, well perfused, no edema. Fingers stiff with some contracture L> R      Lab Results   Component Value Date    WBC 8.79 11/19/2019    HGB 13.3 11/19/2019    HCT 42.9 11/19/2019    PLT 175 11/19/2019    CHOL 188 08/21/2019    TRIG 120 08/21/2019    HDL 45 08/21/2019    LDL 119 (H) 08/21/2019    ALT 16 11/19/2019    AST 16 11/19/2019    NA 139 11/19/2019    K 4.0 11/19/2019    CL 102 11/19/2019    CREAT 0.8 11/19/2019    BUN 17.0 11/19/2019    CO2 25 11/19/2019    TSH 1.46 05/23/2019    INR 1.0 05/10/2019    GLU 197 (H) 11/19/2019    HGBA1C 7.2 (A) 02/17/2020     Assessment/Plan:       1. Essential hypertension    - Comprehensive metabolic panel; Future  - Lipid panel; Future  Chronic  Stable  Advised to continue current  medication  Monitor blood pressure at home  2. Uncontrolled type 2 diabetes mellitus with hyperglycemia  - POCT Hemoglobin A1C  A1c today stable at 7.2  Continue current medication and follow-up with endocrinology  Importance of seeing an ophthalmologist once a year discussed  3. Type 2 diabetes mellitus treated with insulin    4. Diabetic peripheral neuropathy    5. Cerebrovascular accident (CVA), unspecified mechanism  - simvastatin (ZOCOR) 10 MG tablet; Take 1 tablet (10 mg total) by mouth every other day  Dispense: 90 tablet; Refill: 0  Significant neurological deficit on both sides right worse  6. Mixed hyperlipidemia  - Lipid panel; Future  - simvastatin (ZOCOR) 10 MG tablet; Take 1 tablet (10 mg total) by mouth every other day  Dispense: 90 tablet; Refill: 0  Patient on 10 mg stating that the only thing she can tolerate  Recheck lipid panel  Might be too low given her risk factors  7. Pancreas cyst    8. Dizziness  Current signs and symptoms are ongoing for 3 days  Possible risk for increased stroke-recommended the patient for further evaluation with  CAT scan as  has been 3 days.  Patient states that she would rather go to the emergency room to have work-up done there  Importance of getting checked out for stroke discussed  Patient currently on a low dose of simvastatin 10 mg-states that is the only one she can tolerate  Need recheck lipid panel and then adjust medication accordingly    Follow-up after ER visit    This note was generated by the Epic EMR system/ Dragon speech recognition and may contain inherent errors or omissions not intended by the user. Grammatical errors, random word insertions, deletions, pronoun errors and incomplete sentences are occasional consequences of this technology due to software limitations. Not all errors are caught or corrected. If there are questions or concerns about the content of this note or information contained within the body of this dictation they should be  addressed directly with the author for clarification      Rebecca Eaton, MD

## 2020-02-18 ENCOUNTER — Encounter (INDEPENDENT_AMBULATORY_CARE_PROVIDER_SITE_OTHER): Payer: Self-pay | Admitting: Family Medicine

## 2020-02-23 ENCOUNTER — Encounter (INDEPENDENT_AMBULATORY_CARE_PROVIDER_SITE_OTHER): Payer: Self-pay

## 2020-02-26 ENCOUNTER — Other Ambulatory Visit: Payer: Medicare Other

## 2020-03-04 ENCOUNTER — Other Ambulatory Visit: Payer: Medicare Other

## 2020-03-10 ENCOUNTER — Other Ambulatory Visit (FREE_STANDING_LABORATORY_FACILITY): Payer: Medicare Other

## 2020-03-10 DIAGNOSIS — E782 Mixed hyperlipidemia: Secondary | ICD-10-CM

## 2020-03-10 DIAGNOSIS — I10 Essential (primary) hypertension: Secondary | ICD-10-CM

## 2020-03-10 LAB — COMPREHENSIVE METABOLIC PANEL
ALT: 15 U/L (ref 0–55)
AST (SGOT): 18 U/L (ref 5–34)
Albumin/Globulin Ratio: 1.1 (ref 0.9–2.2)
Albumin: 3.7 g/dL (ref 3.5–5.0)
Alkaline Phosphatase: 93 U/L (ref 37–106)
Anion Gap: 10 (ref 5.0–15.0)
BUN: 17 mg/dL (ref 7.0–19.0)
Bilirubin, Total: 0.5 mg/dL (ref 0.2–1.2)
CO2: 28 mEq/L (ref 21–29)
Calcium: 9.4 mg/dL (ref 8.5–10.5)
Chloride: 101 mEq/L (ref 100–111)
Creatinine: 0.9 mg/dL (ref 0.4–1.5)
Globulin: 3.4 g/dL (ref 2.0–3.7)
Glucose: 136 mg/dL — ABNORMAL HIGH (ref 70–100)
Potassium: 4.2 mEq/L (ref 3.5–5.1)
Protein, Total: 7.1 g/dL (ref 6.0–8.3)
Sodium: 139 mEq/L (ref 136–145)

## 2020-03-10 LAB — LIPID PANEL
Cholesterol / HDL Ratio: 5.8
Cholesterol: 250 mg/dL — ABNORMAL HIGH (ref 0–199)
HDL: 43 mg/dL (ref 40–9999)
LDL Calculated: 180 mg/dL — ABNORMAL HIGH (ref 0–99)
Triglycerides: 134 mg/dL (ref 34–149)
VLDL Calculated: 27 mg/dL (ref 10–40)

## 2020-03-10 LAB — HEMOLYSIS INDEX: Hemolysis Index: 2 (ref 0–18)

## 2020-03-10 LAB — GFR: EGFR: >60

## 2020-03-13 ENCOUNTER — Encounter (INDEPENDENT_AMBULATORY_CARE_PROVIDER_SITE_OTHER): Payer: Self-pay

## 2020-03-17 ENCOUNTER — Encounter (INDEPENDENT_AMBULATORY_CARE_PROVIDER_SITE_OTHER): Payer: Self-pay | Admitting: Cardiovascular Disease

## 2020-03-17 ENCOUNTER — Other Ambulatory Visit (INDEPENDENT_AMBULATORY_CARE_PROVIDER_SITE_OTHER): Payer: Self-pay

## 2020-03-17 ENCOUNTER — Ambulatory Visit (INDEPENDENT_AMBULATORY_CARE_PROVIDER_SITE_OTHER): Payer: Medicare Other | Admitting: Cardiovascular Disease

## 2020-03-17 VITALS — BP 112/82 | HR 64 | Ht 65.0 in | Wt 197.6 lb

## 2020-03-17 DIAGNOSIS — I639 Cerebral infarction, unspecified: Secondary | ICD-10-CM

## 2020-03-17 DIAGNOSIS — I10 Essential (primary) hypertension: Secondary | ICD-10-CM

## 2020-03-17 DIAGNOSIS — E785 Hyperlipidemia, unspecified: Secondary | ICD-10-CM

## 2020-03-17 MED ORDER — VERAPAMIL HCL ER 120 MG PO TBCR
120.00 mg | EXTENDED_RELEASE_TABLET | Freq: Every evening | ORAL | 1 refills | Status: DC
Start: 2020-03-17 — End: 2020-09-28

## 2020-03-17 MED ORDER — LOSARTAN POTASSIUM 50 MG PO TABS
50.0000 mg | ORAL_TABLET | Freq: Two times a day (BID) | ORAL | 1 refills | Status: DC
Start: 2020-03-17 — End: 2020-10-02

## 2020-03-17 NOTE — Progress Notes (Signed)
error 

## 2020-03-17 NOTE — Progress Notes (Signed)
[facility] OFF[facility] PROGRESS NOTE    [facility] OFFICE -CARDIOLOGY  [phone]  [address]  [place] [address]  Dept: [phone]  Dept Fax: [phone]       Patient Name: Emily Rowe, Emily Rowe    Date of Visit:  March 17, 2020  Date of Birth: [birth year]  AGE: 69 y.o.  Medical Record #: [id number]  Requesting Physician: Rebecca Eaton, MD      CHIEF COMPLAINT: Follow-up (4 month )      HISTORY OF PRESENT ILLNESS:    She is a pleasant 69 y.o. female has a history of HTN, HLD, prior CVA in 2012 and diabetes.  She reports having "panic attacks" and reports having "gas in her brain" and she burps when she pushes on her head.  She states he has been trying to get in touch with psychiatry has had difficult time doing so.  She also reports that her gastroenterologist is "messing with her".  She states her blood pressures are elevated at times but she uses a wrist cuff.  Blood pressures today under excellent control.  She denies any particular chest pain or palpitations.      PAST MEDICAL HISTORY: She has a past medical history of Anxiety, Asthma, Depression, Diabetes, Hyperlipemia, Hypertensive disorder, Hypokalemia, Neuropathy, Stroke (2012), and TIA (transient ischemic attack). She has a past surgical history that includes Hysterectomy; Tonsillectomy; Reduction mammaplasty; and urinary bladder.    ALLERGIES:   Allergies   Allergen Reactions    Codeine Nausea And Vomiting    Crestor [Rosuvastatin] Rash     D/c due to severe rash and higher blood sugars    Glucosamine Hives and Nausea And Vomiting    Latex Rash    Eggs Or Egg-Derived Products      Patient had an allergy test and stated that she was allergic to eggs    Gabapentin      Dizziness and rash, irregular heart rate    Invokana [Canagliflozin]     Levemir [Insulin Detemir] Nausea And Vomiting    Lyrica [Pregabalin]      PT STATES IT MAKES HER NEUROPATHY WORSE STATES SHE FEELS LIKE SHE IS  ON FIRE    Metformin      diarrhea    Evaristo Bury [Insulin Degludec]     Victoza [Liraglutide] Nausea And Vomiting    Flaxseed [Linseed Oil] Rash    Omega 3 [Fish Oil] Rash    Peanut-Containing Drug Products Rash       MEDICATIONS:   Current Outpatient Medications   Medication Sig    aspirin 81 MG chewable tablet Chew 81 mg by mouth daily TAKES SOME DAYS      glipiZIDE (GLUCOTROL) 10 MG 24 hr tablet Take 1 tablet (10 mg total) by mouth daily    glucose blood test strip Check sugar three times a day, ICD 10 E11.9    insulin glargine (LANTUS SOLOSTAR) 100 UNIT/ML injection pen Inject 10 units into skin daily at bedtime (Patient taking differently: as needed Inject 10 units into skin daily at bedtime AS NEEDED  )    LORazepam (ATIVAN) 0.5 MG tablet Take 0.5 tablets (0.25 mg total) by mouth nightly as needed for Anxiety    losartan (COZAAR) 50 MG tablet Take 1 tablet (50 mg total) by mouth nightly (Patient taking differently: Take 50 mg by mouth 2 (two) times daily   )    PEG-KCl-NaCl-NaSulf-Na Asc-C 100 g Recon Soln  simvastatin (ZOCOR) 10 MG tablet Take 1 tablet (10 mg total) by mouth every other day    Tradjenta 5 MG Tab TAKE 1 TABLET(5 MG) BY MOUTH DAILY    verapamil ER (CALAN-SR) 120 MG CR tablet TAKE 1 TABLET(120 MG) BY MOUTH DAILY        FAMILY HISTORY: family history includes Breast cancer in her daughter; Diabetes in her maternal grandmother and mother; Heart attack in her mother; Stroke in her maternal grandfather and maternal grandmother.    SOCIAL HISTORY: She reports that she has never smoked. She has never used smokeless tobacco. She reports that she does not drink alcohol and does not use drugs.    PHYSICAL EXAMINATION    Visit Vitals  BP 112/82 (Patient Position: Sitting, Cuff Size: Large)   Pulse 64   Ht 1.651 m (5\' 5" )   Wt 89.6 kg (197 lb 9.6 oz)   BMI 32.88 kg/m       General Appearance:  An anxious-appearing female in no acute distress.    Skin: Warm and dry to touch, no apparent  skin lesions, or masses noted.  Head: Normocephalic, normal hair pattern, no masses or tenderness   Eyes: EOMS Intact, PERRL, conjunctivae and lids unremarkable.  ENT: Ears, Nose and throat reveal no gross abnormalities.  No pallor or cyanosis.  Dentition good.   Neck: JVP normal, no carotid bruit, thyroid not enlarged   Chest: Clear to auscultation bilaterally with good air movement and respiratory effort and no wheezes, rales, or rhonchi   Cardiovascular: Regular rhythm, S1 normal, S2 normal, No S3 or S4, Apical impulse not displaced. No murmurs. No gallops or rubs detected   Abdomen: Soft, nontender, nondistended, with normoactive bowel sounds. No organomegaly.  No pulsatile masses, or bruits.   Extremities: Warm without edema. No clubbing, or cyanosis. All peripheral pulses are full and equal.   Neuro: Alert and oriented x3.       LABS:   No results found for: CBC  Lab Results   Component Value Date    AST 18 03/10/2020    ALT 15 03/10/2020     No results found for: LIPID  Lab Results   Component Value Date    HGBA1C 7.2 (A) 02/17/2020    BNP 24 06/05/2018    TSH 1.46 05/23/2019           IMPRESSION:   1. Hypertension, currently controlled.    a.  Intolerances w/ toprol XL, lisinopril, and chlorthalidone.  2. Previous palpitations with unremarkable Body Guardian monitor 05/2017  3. Hyperlipidemia.   4. Obesity.   5. Structurally normal heart by echocardiogram in 2016.   6. Diabetes.   7. CVA in February 2012.  8. Patient reports she had sleep study in past and was not recommended for CPAP.  9. "Panic attacks" per patient    RECOMMENDATIONS:  1.  Continue current heart medications blood pressure good control.  2.  Obtain arm blood pressure cuff, wrist cuff seems to be inaccurate.  3.  She needs to be in touch with her primary physician or psychiatry she seems to be having symptoms of "panic attacks" these are likely causing intermittent spikes of her blood pressure.  Would recommend treating this from a  psychiatric standpoint rather than with antihypertensive therapy.  4.  We will refill her blood pressure medication  5.  APP follow-up in 6 months.  No orders of the defined types were placed in this encounter.      No orders of the defined types were placed in this encounter.      SIGNED:    Theda Sers, MD          This note was generated by the Dragon speech recognition and may contain errors or omissions not intended by the user. Grammatical errors, random word insertions, deletions, pronoun errors, and incomplete sentences are occasional consequences of this technology due to software limitations. Not all errors are caught or corrected. If there are questions or concerns about the content of this note or information contained within the body of this dictation, they should be addressed directly with the author for clarification.

## 2020-03-23 ENCOUNTER — Other Ambulatory Visit (INDEPENDENT_AMBULATORY_CARE_PROVIDER_SITE_OTHER): Payer: Self-pay

## 2020-03-23 DIAGNOSIS — E1165 Type 2 diabetes mellitus with hyperglycemia: Secondary | ICD-10-CM

## 2020-03-23 NOTE — Telephone Encounter (Signed)
Received faxed request from [facility] [address]    UV 03/26/20  LV 11/21/19

## 2020-03-24 ENCOUNTER — Other Ambulatory Visit (INDEPENDENT_AMBULATORY_CARE_PROVIDER_SITE_OTHER): Payer: Self-pay | Admitting: Internal Medicine

## 2020-03-24 DIAGNOSIS — E1165 Type 2 diabetes mellitus with hyperglycemia: Secondary | ICD-10-CM

## 2020-03-26 ENCOUNTER — Other Ambulatory Visit (INDEPENDENT_AMBULATORY_CARE_PROVIDER_SITE_OTHER): Payer: Self-pay

## 2020-03-26 ENCOUNTER — Encounter: Payer: Self-pay | Admitting: Colon & Rectal Surgery

## 2020-03-26 ENCOUNTER — Ambulatory Visit (INDEPENDENT_AMBULATORY_CARE_PROVIDER_SITE_OTHER): Payer: Medicare Other | Admitting: Internal Medicine

## 2020-03-26 ENCOUNTER — Other Ambulatory Visit: Payer: Self-pay | Admitting: Colon & Rectal Surgery

## 2020-03-26 DIAGNOSIS — E1165 Type 2 diabetes mellitus with hyperglycemia: Secondary | ICD-10-CM

## 2020-03-26 NOTE — Telephone Encounter (Signed)
Received faxed request from [facility] [address] and [facility].    UV 03/26/20

## 2020-03-27 MED ORDER — LANTUS SOLOSTAR 100 UNIT/ML SC SOPN
PEN_INJECTOR | SUBCUTANEOUS | 0 refills | Status: DC
Start: 2020-03-27 — End: 2020-10-26

## 2020-03-29 MED ORDER — GLIPIZIDE ER 10 MG PO TB24
10.00 mg | ORAL_TABLET | Freq: Every day | ORAL | 1 refills | Status: DC
Start: 2020-03-29 — End: 2020-06-18

## 2020-03-29 MED ORDER — LANTUS SOLOSTAR 100 UNIT/ML SC SOPN
PEN_INJECTOR | SUBCUTANEOUS | 0 refills | Status: DC
Start: 2020-03-29 — End: 2020-05-28

## 2020-05-13 ENCOUNTER — Telehealth: Payer: Self-pay

## 2020-05-13 NOTE — Telephone Encounter (Signed)
Pt called asking for her colonoscopy results from 03/26/2020.  Read impression to patient.  Pt requested results be sent to Dr. [name].  Faxed to # [phone].

## 2020-05-19 ENCOUNTER — Ambulatory Visit (INDEPENDENT_AMBULATORY_CARE_PROVIDER_SITE_OTHER): Payer: Medicare Other | Admitting: Family Medicine

## 2020-05-28 ENCOUNTER — Encounter (INDEPENDENT_AMBULATORY_CARE_PROVIDER_SITE_OTHER): Payer: Self-pay | Admitting: Family Medicine

## 2020-05-28 ENCOUNTER — Ambulatory Visit (INDEPENDENT_AMBULATORY_CARE_PROVIDER_SITE_OTHER): Payer: Medicare Other | Admitting: Family Medicine

## 2020-05-28 VITALS — BP 163/83 | HR 85 | Temp 98.5°F | Resp 16 | Wt 204.0 lb

## 2020-05-28 DIAGNOSIS — E1142 Type 2 diabetes mellitus with diabetic polyneuropathy: Secondary | ICD-10-CM

## 2020-05-28 DIAGNOSIS — F329 Major depressive disorder, single episode, unspecified: Secondary | ICD-10-CM

## 2020-05-28 DIAGNOSIS — Z0184 Encounter for antibody response examination: Secondary | ICD-10-CM

## 2020-05-28 DIAGNOSIS — E1165 Type 2 diabetes mellitus with hyperglycemia: Secondary | ICD-10-CM

## 2020-05-28 DIAGNOSIS — I639 Cerebral infarction, unspecified: Secondary | ICD-10-CM

## 2020-05-28 DIAGNOSIS — F32A Depression, unspecified: Secondary | ICD-10-CM | POA: Insufficient documentation

## 2020-05-28 DIAGNOSIS — E782 Mixed hyperlipidemia: Secondary | ICD-10-CM

## 2020-05-28 DIAGNOSIS — I10 Essential (primary) hypertension: Secondary | ICD-10-CM

## 2020-05-28 LAB — POCT HEMOGLOBIN A1C: POCT Hgb A1C: 7.3 % — AB (ref 3.9–5.9)

## 2020-05-28 NOTE — Progress Notes (Signed)
Have you seen any specialists/other providers since your last visit with [place]?    No    Arm preference verified?   Yes    The patient is due for shingles vaccine and AD    Date: 05/28/2020 4:09 PM   Patient ID: Emily Rowe is a 69 y.o. female.    Chief Complaint:  Chief Complaint   Patient presents with    Hypertension     ANSWERED EVERY DAY FOR DEPRESSION    Hyperlipidemia       HPI:  69 year old female with diabetes, stroke, hypertension, recent hospitalization status post colonoscopy for perforation in for follow-up       Since last visit been to cardiologist  No med change    Mood - up and down  Been getting panic attacks  She lost another family member  Family stress +  Taking Amitriptyline - given by podiatrist-been taking it 1 week  No suicidal thoughts or homicidal thoughts    Since last stroke been dizzi a lot  Last 3 days feel more dizziness  On and off headache  The patient has stable chronic, essential HTN with no evidence of CHF or proteinuria.  The patient is compliant with anti-hypertensive therapy and denies side effects to therapy.  Pt denies CP, SOB, dizziness, orthopnea, PND or edema.  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  According to patient she has discussed this with her gastroenterologist Dr. Maurine Simmering to her this was discussed over the phone and they just recommended a 96-month - did in 01/2020- no change in size    Diabetes-patient using insulin and Tradjenta  Occasional hypoglycemia but denies having any signs and symptoms  Has seen ophthalmologist but is due for one this year      The other 14 point review systems are negative except  those in HPI.    Current Medications:  Outpatient Medications Marked as Taking for the 05/28/20 encounter (Office Visit) with Rebecca Eaton, MD   Medication Sig Dispense Refill    aspirin 81 MG chewable tablet Chew 81 mg by mouth daily TAKES SOME DAYS        glipiZIDE (GLUCOTROL) 10 MG 24 hr tablet Take 1 tablet (10 mg total) by mouth daily 90 tablet 1    glucose blood test strip Check sugar three times a day, ICD 10 E11.9 100 each 10    insulin glargine (LANTUS SOLOSTAR) 100 UNIT/ML injection pen Inject 10 units into skin daily at bedtime 15 mL 0    LORazepam (ATIVAN) 0.5 MG tablet Take 0.5 tablets (0.25 mg total) by mouth nightly as needed for Anxiety 20 tablet 0    losartan (COZAAR) 50 MG tablet Take 1 tablet (50 mg total) by mouth 2 (two) times daily (Patient taking differently: Take 50 mg by mouth 2 (two) times daily STATES AT 05/28/20 VISIT SHE TAKES BOTH PILLS AT THE SAME TIME AT NIGHT  ) 180 tablet 1    simvastatin (ZOCOR) 10 MG tablet Take 1 tablet (10 mg total) by mouth every other day 90 tablet 0    Tradjenta 5 MG Tab  TAKE 1 TABLET(5 MG) BY MOUTH DAILY 90 tablet 1    verapamil ER (CALAN-SR) 120 MG CR tablet Take 1 tablet (120 mg total) by mouth nightly (Patient taking differently: Take 120 mg by mouth every morning   ) 90 tablet 1    [DISCONTINUED] glipiZIDE (GLUCOTROL) 10 MG 24 hr tablet Take 1 tablet by mouth daily 90 tablet 0       Past Medical History:  Past Medical History:   Diagnosis Date    Anxiety     Asthma     allergic reaction to mold, not since 20 years    Depression     Diabetes     Hyperlipemia     Hypertensive disorder     Hypokalemia     Neuropathy     Stroke 2012    TIA (transient ischemic attack)        Past Surgical History:  Past Surgical History:   Procedure Laterality Date    HYSTERECTOMY      REDUCTION MAMMAPLASTY      TONSILLECTOMY      urinary bladder         Family History:  Family History   Problem Relation Age of Onset    Diabetes Mother      Heart attack Mother     Diabetes Maternal Grandmother     Stroke Maternal Grandmother     Breast cancer Daughter     Stroke Maternal Grandfather        Social History:   reports that she has never smoked. She has never used smokeless tobacco. She reports that she does not drink alcohol and does not use drugs.    Allergies:  Allergies   Allergen Reactions    Codeine Nausea And Vomiting    Crestor [Rosuvastatin] Rash     D/c due to severe rash and higher blood sugars    Glucosamine Hives and Nausea And Vomiting    Latex Rash    Eggs Or Egg-Derived Products      Patient had an allergy test and stated that she was allergic to eggs    Gabapentin      Dizziness and rash, irregular heart rate    Invokana [Canagliflozin]     Levemir [Insulin Detemir] Nausea And Vomiting    Lyrica [Pregabalin]      PT STATES IT MAKES HER NEUROPATHY WORSE STATES SHE FEELS LIKE SHE IS ON FIRE    Metformin      diarrhea    Evaristo Bury [Insulin Degludec]     Victoza [Liraglutide] Nausea And Vomiting    Flaxseed [Linseed Oil] Rash    Omega 3 [Fish Oil] Rash    Peanut-Containing Drug Products Rash       The following sections were reviewed this encounter by the provider:   Tobacco   Allergies   Meds   Problems   Med Hx   Surg Hx   Fam Hx          VITALS:  Vitals:    05/28/20 1533   BP: 163/83   BP Site: Left arm   Patient Position: Sitting   Cuff Size: Large   Pulse: 85   Resp: 16   Temp: 98.5 F (36.9 C)   TempSrc: Oral   SpO2: 96%   Weight: 92.5 kg (204 lb)        ROS:  General/Constitutional:   Denies Chills. Denies Fatigue. Denies Fever.   Ophthalmologic:   Denies Blurred vision. Denies Eye Pain.  ENT:   Denies Nasal Discharge. Denies Ear pain. Denies Sinus pain.   Endocrine:   Denies Polydipsia. Denies Polyuria.   Respiratory:   Denies Cough. Denies Orthopnea. Denies Shortness of breath. Denies Wheezing.   Cardiovascular:   Denies Chest pain. Denies Chest pain with exertion. Denies Leg Claudication. Denies      Palpitations.  Denies Swelling in hands/feet.   Gastrointestinal:   Denies Abdominal pain. Denies Blood in stool. Denies Constipation. Denies Diarrhea.      Denies Heartburn. Denies Nausea. Denies Vomiting.   Genitourinary:   Denies Blood in urine. Denies Frequent urination. Denies Painful urination.   Musculoskeletal:   + Leg cramps. + Muscle aches.   Skin:   Denies Skin lesion(s).   Neurologic:   Denies Dizziness. +Gait abnormality. Denies Headache. +  Tingling/Numbness.     Physical Exam:  Alert and oriented. Affect is normal and appropriate. Clear thought process with normal reasoning and conversational tone and logic. No psychomotor retardation. Not in any acute distress  Skin: no rash or abnormal lesions  Eyes - normal eye movements.  Pupils equal and reactive to light  Eyelid normal on exam  Ears - TM clear, external ear normal  Nose - no acute changes  Throat - Oral mucosa moist and pink, no abnormality noted  Neck- No thyroid enlargement noted, no palpable cervical lymph nodes  HEART: S1S2 heard, regular rate and rhythm  LUNGS: good air exchange, no crackles or ronchi or wheezing  ABDOMEN: Positive bowel sounds, no hepatospleenomegaly appreciated, no tenderness   Back: Normal curvature, no tenderness.   Extremities: FROM, no deformities, no edema, no erythema.   No knee effusion.   mild tendernesson the joint line.  Neuro: Physiological, speech better than before.   Right side weakness.   Restricted and only able to abduct <20%, Right leg weakness.   Strength RUE and RLE 3/5.         Skin: Normal, no rashes, no lesions noted.   Extremities: Warm, well perfused, no edema. Fingers stiff with some contracture L> R  Extremity-no open wound    Lab Results   Component Value Date    WBC 8.79 11/19/2019    HGB 13.3 11/19/2019    HCT 42.9 11/19/2019    PLT 175 11/19/2019    CHOL 250 (H) 03/10/2020    TRIG 134 03/10/2020    HDL 43 03/10/2020    LDL 180 (H) 03/10/2020    ALT 15 03/10/2020    AST 18 03/10/2020    NA 139 03/10/2020     K 4.2 03/10/2020    CL 101 03/10/2020    CREAT 0.9 03/10/2020    BUN 17.0 03/10/2020    CO2 28 03/10/2020    TSH 1.46 05/23/2019    INR 1.0 05/10/2019    GLU 136 (H) 03/10/2020    HGBA1C 7.3 (A) 05/28/2020     Assessment/Plan:       1. Essential hypertension  - Comprehensive metabolic panel; Future  - Lipid panel; Future  - Urine Microalbumin Random; Future  Chronic  Stable  Continue verapamil in the morning and losartan 100 mg in the evening  Low-salt diet  Monitor blood pressure at home  2. Diabetic peripheral neuropathy  - POCT Hemoglobin A1C  - Comprehensive metabolic panel; Future  - Lipid panel; Future  - Urine Microalbumin Random; Future  Diabetes is stable  A1c at 7.2  Importance of seeing ophthalmologist discussed  Continue statin Zocor 10 mg once a day  Patient unable to tolerate higher dose because she is developing rash  Advised to come in for fasting lab soon  3. Uncontrolled type 2 diabetes mellitus with hyperglycemia  - POCT Hemoglobin A1C  - Comprehensive metabolic panel; Future  - Lipid panel; Future  - Urine Microalbumin Random; Future    4. Mixed hyperlipidemia  - Lipid panel; Future  Currently on 10 mg simvastatin unable to tolerate 20 mg as developing rash  Check levels  5. Cerebrovascular accident (CVA), unspecified mechanism  With chronic weakness and instability and dizziness  Currently not under the care of neurologist  Importance stressed  6. Immunity status testing  - Hepatitis B (HBV) Surface Antibody Quant; Future  7.  Depression  Over the years I have treated her for depression-tried to give her SSRI or SNRI but she is totally been against it  She has already stopped taking her medication as it was not helping or it had some side effects  Defers going to her neurologist because they also want her to take psychiatric medication      Need send ophthal  Set appt with endo  On lantus, tradjenta  Return in about 3 months (around 08/28/2020) for Diabetes, hypertension fu and pneumovacc 30  mt.      This note was generated by the Epic EMR system/ Dragon speech recognition and may contain inherent errors or omissions not intended by the user. Grammatical errors, random word insertions, deletions, pronoun errors and incomplete sentences are occasional consequences of this technology due to software limitations. Not all errors are caught or corrected. If there are questions or concerns about the content of this note or information contained within the body of this dictation they should be addressed directly with the author for clarification      Rebecca Eaton, MD

## 2020-05-29 ENCOUNTER — Ambulatory Visit (INDEPENDENT_AMBULATORY_CARE_PROVIDER_SITE_OTHER): Payer: Medicare Other

## 2020-05-29 ENCOUNTER — Other Ambulatory Visit (FREE_STANDING_LABORATORY_FACILITY): Payer: Medicare Other

## 2020-05-29 VITALS — BP 148/87 | HR 82

## 2020-05-29 DIAGNOSIS — E782 Mixed hyperlipidemia: Secondary | ICD-10-CM

## 2020-05-29 DIAGNOSIS — Z0184 Encounter for antibody response examination: Secondary | ICD-10-CM

## 2020-05-29 DIAGNOSIS — I10 Essential (primary) hypertension: Secondary | ICD-10-CM

## 2020-05-29 DIAGNOSIS — E1165 Type 2 diabetes mellitus with hyperglycemia: Secondary | ICD-10-CM

## 2020-05-29 DIAGNOSIS — E1142 Type 2 diabetes mellitus with diabetic polyneuropathy: Secondary | ICD-10-CM

## 2020-05-29 DIAGNOSIS — Z0130 Encounter for examination of blood pressure without abnormal findings: Secondary | ICD-10-CM

## 2020-05-29 LAB — COMPREHENSIVE METABOLIC PANEL
ALT: 16 U/L (ref 0–55)
AST (SGOT): 18 U/L (ref 5–34)
Albumin/Globulin Ratio: 1.1 (ref 0.9–2.2)
Albumin: 3.8 g/dL (ref 3.5–5.0)
Alkaline Phosphatase: 99 U/L (ref 37–106)
Anion Gap: 11 (ref 5.0–15.0)
BUN: 14 mg/dL (ref 7.0–19.0)
Bilirubin, Total: 0.3 mg/dL (ref 0.2–1.2)
CO2: 26 mEq/L (ref 21–29)
Calcium: 9.2 mg/dL (ref 8.5–10.5)
Chloride: 102 mEq/L (ref 100–111)
Creatinine: 0.8 mg/dL (ref 0.4–1.5)
Globulin: 3.4 g/dL (ref 2.0–3.7)
Glucose: 154 mg/dL — ABNORMAL HIGH (ref 70–100)
Potassium: 4 mEq/L (ref 3.5–5.1)
Protein, Total: 7.2 g/dL (ref 6.0–8.3)
Sodium: 139 mEq/L (ref 136–145)

## 2020-05-29 LAB — LIPID PANEL
Cholesterol / HDL Ratio: 5.5
Cholesterol: 238 mg/dL — ABNORMAL HIGH (ref 0–199)
HDL: 43 mg/dL (ref 40–9999)
LDL Calculated: 156 mg/dL — ABNORMAL HIGH (ref 0–99)
Triglycerides: 195 mg/dL — ABNORMAL HIGH (ref 34–149)
VLDL Calculated: 39 mg/dL (ref 10–40)

## 2020-05-29 LAB — GFR: EGFR: >60

## 2020-05-29 LAB — MICROALBUMIN, RANDOM URINE
Urine Creatinine, Random: 39.2 mg/dL
Urine Microalbumin, Random: <5 (ref 0.0–30.0)

## 2020-05-29 LAB — HEMOLYSIS INDEX: Hemolysis Index: 5 (ref 0–18)

## 2020-05-29 LAB — HEPATITIS B SURFACE ANTIBODY: HEPATITIS B SURFACE ANTIBODY: <8

## 2020-05-29 NOTE — Progress Notes (Signed)
Called patient at 08:34am. Patient was informed per Dr. Delman Cheadle if she is having any Sob, chest pain, light headed, palpitation or facial dropping she is to go to ER. Patient verbalizes understanding.

## 2020-05-29 NOTE — Progress Notes (Signed)
Patient presented to the office for a blood pressure check.    States she is having a headache and feeling weak. No c/o chest pain, light headed, or severe SOB.    BP read 148/87   Pulse 82   O2 sat 95%       Physician informed of results.

## 2020-06-05 ENCOUNTER — Other Ambulatory Visit: Payer: Self-pay | Admitting: Pain Medicine

## 2020-06-05 DIAGNOSIS — M545 Low back pain, unspecified: Secondary | ICD-10-CM

## 2020-06-05 DIAGNOSIS — M519 Unspecified thoracic, thoracolumbar and lumbosacral intervertebral disc disorder: Secondary | ICD-10-CM

## 2020-06-18 ENCOUNTER — Ambulatory Visit (INDEPENDENT_AMBULATORY_CARE_PROVIDER_SITE_OTHER): Payer: Medicare Other | Admitting: Internal Medicine

## 2020-06-18 ENCOUNTER — Encounter (INDEPENDENT_AMBULATORY_CARE_PROVIDER_SITE_OTHER): Payer: Self-pay | Admitting: Internal Medicine

## 2020-06-18 ENCOUNTER — Ambulatory Visit
Admission: RE | Admit: 2020-06-18 | Discharge: 2020-06-18 | Disposition: A | Discharge status: Home / Self Care | Payer: Medicare Other | Source: Ambulatory Visit | Attending: Pain Medicine | Admitting: Pain Medicine

## 2020-06-18 VITALS — BP 163/83 | HR 74 | Resp 16 | Wt 206.0 lb

## 2020-06-18 DIAGNOSIS — I10 Essential (primary) hypertension: Secondary | ICD-10-CM

## 2020-06-18 DIAGNOSIS — M47816 Spondylosis without myelopathy or radiculopathy, lumbar region: Secondary | ICD-10-CM | POA: Insufficient documentation

## 2020-06-18 DIAGNOSIS — E1165 Type 2 diabetes mellitus with hyperglycemia: Secondary | ICD-10-CM

## 2020-06-18 DIAGNOSIS — E669 Obesity, unspecified: Secondary | ICD-10-CM

## 2020-06-18 DIAGNOSIS — M545 Low back pain, unspecified: Secondary | ICD-10-CM

## 2020-06-18 DIAGNOSIS — M47814 Spondylosis without myelopathy or radiculopathy, thoracic region: Secondary | ICD-10-CM | POA: Insufficient documentation

## 2020-06-18 DIAGNOSIS — M519 Unspecified thoracic, thoracolumbar and lumbosacral intervertebral disc disorder: Secondary | ICD-10-CM

## 2020-06-18 DIAGNOSIS — Z794 Long term (current) use of insulin: Secondary | ICD-10-CM

## 2020-06-18 DIAGNOSIS — E782 Mixed hyperlipidemia: Secondary | ICD-10-CM

## 2020-06-18 DIAGNOSIS — Z6834 Body mass index (BMI) 34.0-34.9, adult: Secondary | ICD-10-CM

## 2020-06-18 MED ORDER — GLIPIZIDE ER 10 MG PO TB24
10.0000 mg | ORAL_TABLET | Freq: Every day | ORAL | 4 refills | Status: DC
Start: 2020-06-18 — End: 2020-09-14

## 2020-06-18 NOTE — Progress Notes (Signed)
Subjective:      Date: 06/18/2020 12:22 PM   Patient ID: Emily Rowe is a 69 y.o. female.    Chief Complaint:  Chief Complaint   Patient presents with    Uncontrolled type 2 diabetes mellitus with hyperglycemia         HPI  Visit type: follow-up - type II DM   Diagnosed: over 5 years ago  Type: Insulin requiring  Complications: hyperglycemia  Control: inadequate control  Comorbid illness: hyperlipidemia  Last follow-up: 6 months ago    Diabetes Medication Regimen: Glipizide XL 10 mg daily, Tradjenta 5 mg daily , Lantus 10 units daily     Unable to tolerate Metformin due to diarrhea, unable to tolerate Victoza due to n/v, unable to tolerate Invokana. Taking potassium pill daily along with eating sweet potato and avocado everyday. Unable to tolerate Levemir due to episodes of nausea and vomiting. Not able to tolerate tresiba.    Home glucose readings:  FBG 99-100, 2H PPBG 134-140 mg/dl  Daily basal insulin dosing: Levemir (stopped taking 1 month ago)   Daily bolus insulin dosing: non-insulin requiring  Diabetic screening: within the last year, no known retinopathy  Podiatric assessment: admits LE burning/tingling due to hx of stroke   Exercise: walks occasionally   Diet: moderate compliance with recommended diet; eats more vegetables and protein   Response to therapy: with hemoglobin A1C above goal  A1c trend: improving     Lab Results   Component Value Date    HGBA1C 7.3 (A) 05/28/2020    HGBA1C 7.2 (A) 02/17/2020    HGBA1C 7.2 (H) 10/10/2019     Trend is decreasing  Additional concerns: In addition, pt has hyperlipidemia and is compliant with lipid therapy.  Pt denies side effects of lipid therapy - specifically denies myalgia., In addition, pt has stable essential HTN with no evidence of CHF or proteinuria.  The patient is compliant with anti-hypertensive therapy and denies side effects to therapy.  Pt denies CP, SOB, dizziness, orthopnea, PND or edema.     Hx of CVA, with residual right sided weakness and  antalgic gait, no cognitive deficits.     H/o severe hypokalemia which required ER visit; had been off lisinopril at that time.  Notes allergic reaction soon after taking crestor.      Interval history: Elements of HPI were reviewed and updated. Reports consistent compliance with the listed medications for diabetes, hypertension and hyperlipidemia.  Weight has been stable. Had colonic perforation following procedure in 12/20. BG reading last night was 141 mg/dl. FBG ranges around 108 mg/dl in the last week. Her brother recently passed away and reports interval increase in stress as a result.Reviewed lab results from 8/21 which showed A1c of 7.3% in the range of fairly well controlled diabetes. Elevated total cholesterol, LDL/ bad cholesterol levels. Unable to tolerate low dose statin.    ROS:  A complete 12 point ROS was obtained. Pertinent positives and negatives as noted in HPI. All other systems are negative.      Vitals:  BP 163/83 (BP Site: Left arm, Patient Position: Sitting)    Pulse 74    Resp 16    Wt 93.4 kg (206 lb)    SpO2 96%    BMI 34.28 kg/m    Wt Readings from Last 3 Encounters:   06/18/20 93.4 kg (206 lb)   06/18/20 90.7 kg (200 lb)   05/28/20 92.5 kg (204 lb)  Physical Exam   Constitutional: She is oriented to person, place, and time. She appears well-developed and well-nourished.   HENT:   Head: Normocephalic and atraumatic.   Mouth/Throat: Oropharynx is clear and moist.   Eyes: Conjunctivae and EOM are normal.    Neck: Normal range of motion. No thyromegaly present.   Neurological: She is alert and oriented to person, place, and time.   Psychiatric: She has a normal mood and affect.     Complete physical exam is limited by visit type: televisit    Assessment and Plan     Diagnoses and plan were reviewed and updated    1. Uncontrolled type 2 diabetes mellitus with hyperglycemia  Lab Results   Component Value Date    HGBA1C 7.3 (A) 05/28/2020    HGBA1C 7.2 (A) 02/17/2020    HGBA1C 7.2 (H)  10/10/2019        Given h/o intolerance to tresiba, toujeo, basaglar. Lantus has been well tolerated. Significant interval improvement in glycemic control on the current medication regimen- Lantus, glipizide, tradjenta. Recommend compliance with checking BG x2/ day    - Hemoglobin A1C  - Comprehensive metabolic panel  - Lipid panel  - TSH  - Urine Microalbumin Random  - insulin glargine (LANTUS SOLOSTAR) 100 UNIT/ML injection pen; Inject 10 units into skin daily at bedtime  Dispense: 15 mL; Refill: 0    2. Benign hypertension  BP is well controlled on the current meds.    3. Mixed hyperlipidemia  Lab Results   Component Value Date    CHOL 238 (H) 05/29/2020    CHOL 250 (H) 03/10/2020    CHOL 188 08/21/2019     Lab Results   Component Value Date    HDL 43 05/29/2020    HDL 43 03/10/2020    HDL 45 08/21/2019     Lab Results   Component Value Date    LDL 156 (H) 05/29/2020    LDL 180 (H) 03/10/2020    LDL 119 (H) 08/21/2019     Lab Results   Component Value Date    TRIG 195 (H) 05/29/2020    TRIG 134 03/10/2020    TRIG 120 08/21/2019       On statins; well tolerated.  - Lipid panel    4. Obesity (BMI 30-39.9)  Recommend calorie restricted diet and exercise for weight loss.      5. Body mass index 34.0-34.9, adult    6. Long term insulin use      RTC in 3-4 months    Tanya Nones MD MPH  Endocrinologist, [facility]

## 2020-06-24 ENCOUNTER — Other Ambulatory Visit (INDEPENDENT_AMBULATORY_CARE_PROVIDER_SITE_OTHER): Payer: Self-pay | Admitting: Family Medicine

## 2020-06-30 ENCOUNTER — Telehealth: Payer: Self-pay

## 2020-06-30 NOTE — Telephone Encounter (Signed)
Clinical notes (in [place]) faxed to [facility], per their request.  F: [phone]

## 2020-07-24 ENCOUNTER — Encounter (INDEPENDENT_AMBULATORY_CARE_PROVIDER_SITE_OTHER): Payer: Self-pay

## 2020-07-25 ENCOUNTER — Encounter (INDEPENDENT_AMBULATORY_CARE_PROVIDER_SITE_OTHER): Payer: Self-pay

## 2020-07-31 ENCOUNTER — Encounter (INDEPENDENT_AMBULATORY_CARE_PROVIDER_SITE_OTHER): Payer: Self-pay | Admitting: Family Medicine

## 2020-08-03 ENCOUNTER — Encounter: Payer: Self-pay | Admitting: Gastroenterology

## 2020-08-05 ENCOUNTER — Telehealth (INDEPENDENT_AMBULATORY_CARE_PROVIDER_SITE_OTHER): Payer: Self-pay | Admitting: Family Medicine

## 2020-08-05 NOTE — Telephone Encounter (Signed)
Dr. Melonie Florida called inquiring about patient's liver enzymes. Patient told her "my liver is off". She would like the lab results.    States she has a medical release form and will fax it to our office. Informed her once we receive the release form, we can call her or fax her the results.    Dr. Melonie Florida states the [phone] is  Her personal number. Any patient sensitive information can be left at the office phone of [phone].

## 2020-08-10 ENCOUNTER — Telehealth (INDEPENDENT_AMBULATORY_CARE_PROVIDER_SITE_OTHER): Payer: Self-pay | Admitting: Family Medicine

## 2020-08-10 NOTE — Telephone Encounter (Signed)
Lmtcb x1

## 2020-08-10 NOTE — Telephone Encounter (Signed)
Pt would like call back regarding blood test results.     Pt #: [phone] (H)

## 2020-08-10 NOTE — Telephone Encounter (Signed)
Patient called requesting lab results.  Advised of results and recommendations of labs drawn on 05/29/20. Patient verbalized understanding.

## 2020-08-13 ENCOUNTER — Encounter: Payer: Self-pay | Admitting: Gastroenterology

## 2020-08-24 ENCOUNTER — Encounter (INDEPENDENT_AMBULATORY_CARE_PROVIDER_SITE_OTHER): Payer: Self-pay | Admitting: Family Medicine

## 2020-08-24 ENCOUNTER — Encounter (INDEPENDENT_AMBULATORY_CARE_PROVIDER_SITE_OTHER): Payer: Self-pay

## 2020-08-24 ENCOUNTER — Ambulatory Visit (INDEPENDENT_AMBULATORY_CARE_PROVIDER_SITE_OTHER): Payer: Medicare Other | Admitting: Family Medicine

## 2020-08-24 VITALS — BP 153/88 | HR 78 | Temp 98.2°F | Resp 20 | Wt 209.0 lb

## 2020-08-24 DIAGNOSIS — R5383 Other fatigue: Secondary | ICD-10-CM

## 2020-08-24 DIAGNOSIS — E1142 Type 2 diabetes mellitus with diabetic polyneuropathy: Secondary | ICD-10-CM

## 2020-08-24 DIAGNOSIS — R140 Abdominal distension (gaseous): Secondary | ICD-10-CM

## 2020-08-24 DIAGNOSIS — R1011 Right upper quadrant pain: Secondary | ICD-10-CM

## 2020-08-24 DIAGNOSIS — E1165 Type 2 diabetes mellitus with hyperglycemia: Secondary | ICD-10-CM

## 2020-08-24 DIAGNOSIS — E782 Mixed hyperlipidemia: Secondary | ICD-10-CM

## 2020-08-24 DIAGNOSIS — I639 Cerebral infarction, unspecified: Secondary | ICD-10-CM

## 2020-08-24 DIAGNOSIS — I10 Essential (primary) hypertension: Secondary | ICD-10-CM

## 2020-08-24 DIAGNOSIS — K862 Cyst of pancreas: Secondary | ICD-10-CM

## 2020-08-24 DIAGNOSIS — Z1231 Encounter for screening mammogram for malignant neoplasm of breast: Secondary | ICD-10-CM

## 2020-08-24 DIAGNOSIS — Z23 Encounter for immunization: Secondary | ICD-10-CM

## 2020-08-24 MED ORDER — PRAVASTATIN SODIUM 20 MG PO TABS
20.0000 mg | ORAL_TABLET | Freq: Every day | ORAL | 0 refills | Status: DC
Start: 2020-08-24 — End: 2020-11-26

## 2020-08-24 NOTE — Progress Notes (Signed)
Have you seen any specialists/other providers since your last visit with [place]?    Yes-Psychiatry    Arm preference verified?   Yes    The patient is due for mammogram, influenza vaccine, shingles vaccine and AD      Date: 08/24/2020 10:37 AM   Patient ID: Emily Rowe is a 69 y.o. female.    Chief Complaint:  Chief Complaint   Patient presents with    Hypertension    Diabetes Follow-up       HPI:  69 year old female with diabetes, stroke, hypertension, recent hospitalization status post colonoscopy for perforation in for follow-up     Seen psychologist and then psychiatrist- Dr Melonie Florida- [facility]  Seen 2 weeks  No med change  Started on pristiq- just started yesterday  No s/e yet    seeing cardiologist  HLD- high risk  She states not taking zocor due to extreme s/s  LDL 156 3 months back    Feels bloated a lot  Burps a lot  Gets sick with everything    S/p colonoscopy had bowel perforation  Was in hospital few days  Seen Dr Molli Hazard scriven- surgeon- suggested swallow endoscopy  With everything she eats feels nauseated and having abd pain    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  Dr [name] sent me a note:  Edrick Oh, MD  Rebecca Eaton, MD  1. Virtual colonoscopy suboptimal distension but negative. Probably would do a yearly FIT for f/u on this pt given hx   2. 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 Would repeat MR in 2 y     Dec 2nd has Colonoscopy scheduled with colorectal surg      Since last stroke been dizzi a lot  Last 3 days feel more dizziness  On and off headache  The patient has stable chronic, essential HTN with no evidence of CHF or proteinuria.  The patient is compliant with anti-hypertensive therapy and denies side effects to therapy.  Pt denies CP, SOB, dizziness, orthopnea, PND or edema.  Currently she is taking verapamil 120 mg in the morning and losartan 50 mg two in the evening          Diabetes-patient using insulin and  Tradjenta  Occasional hypoglycemia but denies having any signs and symptoms  Has seen ophthalmologist but is due for one this year      The other 14 point review systems are negative except those in HPI.    Current Medications:  Outpatient Medications Marked as Taking for the 08/24/20 encounter (Office Visit) with Rebecca Eaton, MD   Medication Sig Dispense Refill    desvenlafaxine (PRISTIQ) 50 MG 24 hr tablet Take 50 mg by mouth daily      glipiZIDE (GLUCOTROL) 10 MG 24 hr tablet Take 1 tablet (10 mg total) by mouth daily 90 tablet 4    glucose blood test strip Check sugar three times a day, ICD 10 E11.9 100 each 10    hydrALAZINE (APRESOLINE) 25 MG tablet Take by mouth      insulin glargine (LANTUS SOLOSTAR) 100 UNIT/ML injection pen Inject 10 units into skin daily at bedtime 15 mL 0    LORazepam (ATIVAN) 0.5 MG tablet Take 0.5 tablets (0.25 mg total) by mouth nightly as needed for Anxiety 20 tablet 0    losartan (COZAAR) 50 MG tablet Take 1 tablet (50 mg total) by mouth 2 (two) times daily (Patient taking differently:  Take 50 mg by mouth 2 (two) times daily STATES AT 05/28/20 VISIT SHE TAKES BOTH PILLS AT THE SAME TIME AT NIGHT  ) 180 tablet 1    PEG-KCl-NaCl-NaSulf-Na Asc-C 100 g Recon Soln       Tradjenta 5 MG Tab TAKE 1 TABLET(5 MG) BY MOUTH DAILY 90 tablet 1    verapamil ER (CALAN-SR) 120 MG CR tablet Take 1 tablet (120 mg total) by mouth nightly (Patient taking differently: Take 120 mg by mouth every morning   ) 90 tablet 1    [DISCONTINUED] simvastatin (ZOCOR) 10 MG tablet Take 1 tablet (10 mg total) by mouth every other day 90 tablet 0       Past Medical History:  Past Medical History:   Diagnosis Date    Anxiety     Asthma     allergic reaction to mold, not since 20 years    Depression     Diabetes     Hyperlipemia     Hypertensive disorder     Hypokalemia     Neuropathy     Stroke 2012    TIA (transient ischemic attack)        Past Surgical History:  Past Surgical History:    Procedure Laterality Date    HYSTERECTOMY      REDUCTION MAMMAPLASTY      TONSILLECTOMY      urinary bladder         Family History:  Family History   Problem Relation Age of Onset    Diabetes Mother     Heart attack Mother     Diabetes Maternal Grandmother     Stroke Maternal Grandmother     Breast cancer Daughter     Stroke Maternal Grandfather        Social History:   reports that she has never smoked. She has never used smokeless tobacco. She reports that she does not drink alcohol and does not use drugs.    Allergies:  Allergies   Allergen Reactions    Codeine Nausea And Vomiting    Crestor [Rosuvastatin] Rash     D/c due to severe rash and higher blood sugars    Glucosamine Hives and Nausea And Vomiting    Latex Rash    Eggs Or Egg-Derived Products      Patient had an allergy test and stated that she was allergic to eggs    Gabapentin      Dizziness and rash, irregular heart rate    Invokana [Canagliflozin]     Levemir [Insulin Detemir] Nausea And Vomiting    Lyrica [Pregabalin]      PT STATES IT MAKES HER NEUROPATHY WORSE STATES SHE FEELS LIKE SHE IS ON FIRE    Metformin      diarrhea    Evaristo Bury [Insulin Degludec]     Victoza [Liraglutide] Nausea And Vomiting    Flaxseed [Linseed Oil] Rash    Omega 3 [Fish Oil] Rash    Peanut-Containing Drug Products Rash       The following sections were reviewed this encounter by the provider:   Tobacco   Allergies   Meds   Problems   Med Hx   Surg Hx   Fam Hx          VITALS:  Vitals:    08/24/20 1003   BP: 153/88   BP Site: Left arm   Patient Position: Sitting   Cuff Size: Large   Pulse: 78   Resp: 20  Temp: 98.2 F (36.8 C)   SpO2: 95%   Weight: 94.8 kg (209 lb)        ROS:  General/Constitutional:   Denies Chills. Denies Fatigue. Denies Fever.   Ophthalmologic:   Denies Blurred vision. Denies Eye Pain.   ENT:   Denies Nasal Discharge. Denies Ear pain. Denies Sinus pain.   Endocrine:   Denies Polydipsia. Denies Polyuria.   Respiratory:    Denies Cough. Denies Orthopnea. Denies Shortness of breath. Denies Wheezing.   Cardiovascular:   Denies Chest pain. Denies Chest pain with exertion. Denies Leg Claudication. Denies      Palpitations. Denies Swelling in hands/feet.   Gastrointestinal:   Denies Abdominal pain. Denies Blood in stool. Denies Constipation. Denies Diarrhea.      Denies Heartburn. Denies Nausea. Denies Vomiting.   Genitourinary:   Denies Blood in urine. Denies Frequent urination. Denies Painful urination.   Musculoskeletal:   + Leg cramps. + Muscle aches.   Skin:   Denies Skin lesion(s).   Neurologic:   Denies Dizziness. +Gait abnormality. Denies Headache. +  Tingling/Numbness.     Physical Exam:  Alert and oriented. Affect is normal and appropriate. Clear thought process with normal reasoning and conversational tone and logic. No psychomotor retardation. Not in any acute distress  Skin: no rash or abnormal lesions  Eyes - normal eye movements.  Pupils equal and reactive to light  Eyelid normal on exam  Ears - TM clear, external ear normal  Nose - no acute changes  Throat - Oral mucosa moist and pink, no abnormality noted  Neck- No thyroid enlargement noted, no palpable cervical lymph nodes  HEART: S1S2 heard, regular rate and rhythm  LUNGS: good air exchange, no crackles or ronchi or wheezing  ABDOMEN: Positive bowel sounds, no hepatospleenomegaly appreciated, no tenderness   Back: Normal curvature, no tenderness.   Extremities: FROM, no deformities, no edema, no erythema.   No knee effusion.   mild tendernesson the joint line.  Neuro: Physiological, speech better than before.   Right side weakness.   Restricted and only able to abduct <20%, Right leg weakness.   Strength RUE and RLE 3/5.         Skin: Normal, no rashes, no lesions noted.   Extremities: Warm, well perfused, no edema. Fingers stiff with some contracture L> R  Extremity-no open wound    Lab Results   Component Value Date    WBC 8.79 11/19/2019    HGB 13.3 11/19/2019     HCT 42.9 11/19/2019    PLT 175 11/19/2019    CHOL 238 (H) 05/29/2020    TRIG 195 (H) 05/29/2020    HDL 43 05/29/2020    LDL 156 (H) 05/29/2020    ALT 16 05/29/2020    AST 18 05/29/2020    NA 139 05/29/2020    K 4.0 05/29/2020    CL 102 05/29/2020    CREAT 0.8 05/29/2020    BUN 14.0 05/29/2020    CO2 26 05/29/2020    TSH 1.46 05/23/2019    INR 1.0 05/10/2019    GLU 154 (H) 05/29/2020    HGBA1C 7.3 (A) 05/28/2020     Assessment/Plan:       1. Bloating  - Comprehensive metabolic panel; Future  - Lipid panel; Future  - Hemoglobin A1C; Future  - Urine Microalbumin Random; Future  - H. pylori breath test  - Ambulatory referral to Gastroenterology  - US Abdomen Limited Ruq; Future    2. Essential hypertension  -  Comprehensive metabolic panel; Future  - Lipid panel; Future  - Hemoglobin A1C; Future  - Urine Microalbumin Random; Future  - H. pylori breath test  - Referral to Physical Therapy - EXTERNAL    3. Uncontrolled type 2 diabetes mellitus with hyperglycemia  - Comprehensive metabolic panel; Future  - Lipid panel; Future  - Hemoglobin A1C; Future  - Urine Microalbumin Random; Future  - H. pylori breath test  - Referral to Physical Therapy - EXTERNAL    4. Diabetic peripheral neuropathy  - Comprehensive metabolic panel; Future  - Lipid panel; Future  - Hemoglobin A1C; Future  - Urine Microalbumin Random; Future  - H. pylori breath test    5. Mixed hyperlipidemia  - Comprehensive metabolic panel; Future  - Lipid panel; Future  - Hemoglobin A1C; Future  - Urine Microalbumin Random; Future  - H. pylori breath test  - pravastatin (PRAVACHOL) 20 MG tablet; Take 1 tablet (20 mg total) by mouth daily  Dispense: 90 tablet; Refill: 0    6. Cerebrovascular accident (CVA), unspecified mechanism  - Comprehensive metabolic panel; Future  - Lipid panel; Future  - Hemoglobin A1C; Future  - Urine Microalbumin Random; Future  - H. pylori breath test  - Referral to Physical Therapy - EXTERNAL    7. Pancreas cyst  - Comprehensive  metabolic panel; Future  - Lipid panel; Future  - Hemoglobin A1C; Future  - Urine Microalbumin Random; Future  - H. pylori breath test  - Ambulatory referral to Gastroenterology    8. Fatigue, unspecified type  - CBC and differential; Future  - TSH; Future  - Referral to Physical Therapy - EXTERNAL    9. Breast cancer screening by mammogram  - Mammo Digital Screening Bilateral W Cad; Future    10. Need for vaccination  - Flu Vaccine High Dose, 65 yrs  - Pneumococcal conjugate vaccine 13-valent less than 5yo IM; Future    11. RUQ pain  - US Abdomen Limited Ruq; Future      Abd bloating  Seeing GI- Dr [name]  Will check urea breath    simvastatin not taking due to s/e  Extreme high risk  3 months back LDL 150  Tried lipitor, crestor and zocor and will try pravastatin but she will benefit from injectable if not improved- should work this with her cardiologist  Patient will also try taking co-Q10 along with her pravastatin to see if it reduces side effects    Vaccine- Advise to do third covid vaccine  Flu today  Hep B due- will get at pharmacy    Patient having right upper quadrant pain with extreme bloating and also ongoing issues  States that she has discussed this with her GI  Patient has had colonoscopy-noted as in HPI perforation status post on a repeat colonoscopy plan  We will do gallbladder ultrasound    Pancreatic cyst as documented in HPI is being monitored by GI    Patient with CVA extreme weakness on her extremities emotionally labile and limited mobility  This is also causing her to have tremendous restrictions on his treatment plans whether it be because of her back pain or her generalized abdominal issue  Should closely follow-up with her psychiatrist to get her mood disorder under control    Importance of getting her mammogram discussed  Daughter diagnosed with breast cancer last year    Extreme mobility reduction  Will benefit from physical therapy to prevent falls and increased ability  Patient has not  had  a liking to the current physical therapy she was attending  Requesting new referral  Referral has been placed  Patient also should continue follow-up with orthopedic/spine specialist    Return in about 3 weeks (around 09/14/2020) for bloating and xle concerns.    This note was generated by the Epic EMR system/ Dragon speech recognition and may contain inherent errors or omissions not intended by the user. Grammatical errors, random word insertions, deletions, pronoun errors and incomplete sentences are occasional consequences of this technology due to software limitations. Not all errors are caught or corrected. If there are questions or concerns about the content of this note or information contained within the body of this dictation they should be addressed directly with the author for clarification      Rebecca Eaton, MD

## 2020-08-25 ENCOUNTER — Encounter (INDEPENDENT_AMBULATORY_CARE_PROVIDER_SITE_OTHER): Payer: Self-pay

## 2020-08-25 LAB — H. PYLORI BREATH TEST: H. pylori Breath Test: NOT DETECTED

## 2020-09-03 ENCOUNTER — Other Ambulatory Visit: Payer: Medicare Other

## 2020-09-08 ENCOUNTER — Telehealth: Payer: Self-pay

## 2020-09-08 ENCOUNTER — Encounter: Payer: Self-pay | Admitting: Gastroenterology

## 2020-09-08 NOTE — Pre-Procedure Instructions (Signed)
SPECIAL INSTRUCTIONS FOR THE [facility] NAVIGATORS:  CONTACT ISOLATION:     TRANSLATOR REQUESTED:      NAME OF PERSON THAT WILL BE PRESENT TO HEAR DISCHARGE INSTRUCTIONS (please state how this person is related to the patient): DAUGHTER- VERONICA HENANDEZ    SPECIAL INSTRUCTIONS: PT WILL CALL OR DESK ON ARRIVAL IF HELP WITH WC NEEDED    TRANSPORTATION: DAUGHTER WILL COME IN WITH PT       TESTING - DAY OF SURGERY:  FSBS  PREGNANCY TEST WAIVERS:    ABNORMAL LABS:        SPECIAL CONSIDERATIONS-BLOOD PRODUCTS:    SPECIAL NEEDS FOR PATIENT  PT HAS RIGHT UE AND LE WEAKNESS SINCE CVA 2012/ SHE WILL USE WC DOS   PT STATES SHE TAKES GLARGINE IN AM ONLY IF BS 170 OR MORE. LAST NOTE DR Binnie Rail RAO 11/2019 IN Epic.MED CL REQUESTED PCP         PLEASE DO NOT REPLY TO THIS EMAIL. IF YOU HAVE ANY QUESTIONS, PLEASE CONTACT THE PREOP INTERVIEW OFFICE AT [phone] OR [phone]   MONDAY  FRIDAY.    PLEASE CALL [phone] ON ARRIVAL IF YOU NEED HELP WITH WHEELCHAIR.     Date of Procedure: 09/17/20    Arrival Time: 12:15 PM     Procedure Time: 1:45 PM      Someone from the hospital will call you after 4pm the business day prior to your scheduled procedure to verify the arrival and procedure times listed above. The purpose of this call is to advise you of any scheduling changes that may have occurred since the time of your telephone interview with the pre surgical services nurse.    NOTE FROM THE ANESTHESIA DEPARTMENT:  Please note that your procedure may be cancelled or rescheduled if you do not complete the required testing, preop clearances and/or follow the medication instructions that are required by your surgeon and /or the anesthesia department.    Requirements marked with an "X" below (i.e.: testing, etc.) Must be completed prior to your procedure :    ( X ) MEDICAL CLEARANCE WITH DR Zadie Cleverly 09/14/20:   I HAVE FAXED REQUEST TO HER OFFICE FOR CLEARANCE AND EKG.     ( X )  EGD / COLONOSCOPY PROCEDURES:  PLEASE FOLLOW THE DIETARY  GUIDELINES     Medication Instructions:    ASPIRIN 81 MG  STOP AS INSTRUCTED BY DR Delman Cheadle    PRISTIQ  TAKE DAY  BEFORE SURGERY AND TAKE ON REGULAR SCHEDULE UNTIL THEN.     PRAVASTATIN, LOSARTAN   TAKE  PM BEFORE SURGERY AND TAKE ON REGULAR SCHEDULE UNTIL THEN.    LORAZEPAM  TAKE PM BEFORE SURGERY IF NEEDED     VERAPAMIL  TAKE  AM OF SURGERY AND ON REGULAR SCHEDULE UNTIL DAY OF SURGERY.    LANTUS (GLARGINE) INSULIN  TAKE AM OF SURGERY IF BLOOD SUGAR 170 OR MORE     GLIPIZIDE, TRADJENTA  DO NOT TAKE AM OF SURGERY. TAKE ON REGULAR SCHEDULE UNTIL THEN.     STOP HERBAL AND GREEN TEA 09/10/20    YOU SHOULD NOT TAKE IBUPROFEN, MOTRIN, ADVIL, ALEVE, NSAIDS AS OF 09/10/20 .     YOU MAY TAKE TYLENOL AS NEEDED.    Hospital Address and Arrival Instructions:   [address] [place], [place] [address]  [facility] is where you will enter.  Please call [phone] morning of surgery if you need assistance upon arrival.    Upon arrival in  the hospital main lobby via [employer], you will proceed to:  - Patient Registration, which is all the way down the hall on the left.  - Once registration is completed, you will be escorted by the registration staff to the Surgical Services Waiting Area, where you wait until a PreOp clinical team member escorts you to a room and initiates the PreOp process.  - You will need to have a valid photo I.D., insurance card and co-pay form of payment if required by insurance.    Pre Surgical General Instructions:  1. Bathe or shower the morning of the procedure with an anti-bacterial soap before arriving (unless instructed to use Chlorhexidine (CHG) or Hibiclens).   2. Do not apply lotion, perfume, cologne, or hair-care products such as hair spray or gels.  3. Do not shave your surgical site at home.  4. Do not wear makeup, jewelry including body piercing, watches, earrings, or rings.   5. You may brush your teeth and gargle on the morning of surgery but do not swallow any  water.  6. Wear casual, loose fitting and comfortable clothing. A gown will be provided.  7. Plan to leave unnecessary valuables, credit cards (except for co-pay payment use) and jewelry at home or with a companion on day of surgery for safe keeping. The hospital is not responsible for lost/stolen items.  8. If you wear contacts please leave them at home. If you wear glasses, please bring a case.  9. Dentures - we will provide a container  10. Hearing aids, you may bring dos but you will be asked to remove them before surgery.  11. Please arrange for someone to drive you home. For your safety you will not be allowed to drive home after sedation or anesthesia. A responsible adult must be present to accompany you home when you are ready to leave. We strongly recommend that all patients have an adult at home with them for the first 24 hours after surgery.  12. Notify your doctor if you develop any sign of illness before the date of your surgery. Report symptoms such as: high fever, sore throat, or other infection, breathing difficulties or chest pain.  13. Discontinue herbal supplements and herbal/green tea one week prior to surgery.    Below is a link/web address to the Preparing for Your Procedure video that walks you through the surgical experience.   [email or web address]    Possible Anesthesia Side Effects:   Nausea and vomiting. This common side effect usually occurs immediately after the procedure, but some people may continue to feel sick for a day or two. Anti-nausea medicines can help.   Dry mouth. You may feel parched when you wake up. As long as you're not too nauseated, sipping water can help take care of your dry mouth.   Sore throat or hoarseness. The tube put in your throat to help you breathe during surgery can leave you with a sore throat after it's removed.   Chills and shivering. It's common for your body temperature to drop during general anesthesia. Your doctors and  nurses will make sure your temperature doesn't fall too much during surgery, but you may wake up shivering and feeling cold. Your chills may last for a few minutes to hours.   Confusion and fuzzy thinking. When first waking from anesthesia, you may feel confused, drowsy, and foggy. This usually lasts for just a few hours, but for some people  especially older adults  confusion can last for days or  weeks.   Muscle aches. The drugs used to relax your muscles during surgery can cause soreness afterward.   Itching. If narcotic (opioid) medications are used during or after your operation, you may be itchy. This is a common side effect of this class of drugs.   Bladder problems. You may have difficulty passing urine for a short time after general anesthesia.   Dizziness. You may feel dizzy when you first stand up. Drinking plenty of fluids should help you feel better.      Discharge Plan:  You will be ready to go home when the following criteria have been met:  - Minimal nausea,  - Tolerating oral liquids,  - Stable vital signs,  - Pain managed,  - Questions have been answered,  - Patient / family have received written discharge instructions.    Recovery Plan:  1. Post Operative Bleeding:  a. Call the doctor if blood soaks through the bandage covering your wound or if blood is soaking through a sanitary pad hourly.  2. Reducing Nausea:  a. Take your pain medication with a little food.   b. Start with clear liquids and increase your diet as tolerated in small amounts.  3. Pain:   a. Take pain medication as prescribed.  b. Do not wait until pain is severe to take medication.  c. Call your doctor if pain is not relieved by the prescribed medication.  d. Take a stool softener to reduce constipation.   4. Infection Prevention:  a. Wash your hands prior to and after changing dressings as directed.  b. Keep dressings clean, dry and intact.  5. When To Call Your Doctor:  a. Fever greater than 100.4 F.   b. Bleeding or  swelling that increases.  c. Increased pain not relieved by medication.  d. Unable to tolerate food or fluids.

## 2020-09-09 ENCOUNTER — Encounter (INDEPENDENT_AMBULATORY_CARE_PROVIDER_SITE_OTHER): Payer: Self-pay | Admitting: Internal Medicine

## 2020-09-11 ENCOUNTER — Other Ambulatory Visit (FREE_STANDING_LABORATORY_FACILITY): Payer: Medicare Other

## 2020-09-11 DIAGNOSIS — I10 Essential (primary) hypertension: Secondary | ICD-10-CM

## 2020-09-11 DIAGNOSIS — R140 Abdominal distension (gaseous): Secondary | ICD-10-CM

## 2020-09-11 DIAGNOSIS — E1165 Type 2 diabetes mellitus with hyperglycemia: Secondary | ICD-10-CM

## 2020-09-11 DIAGNOSIS — E1142 Type 2 diabetes mellitus with diabetic polyneuropathy: Secondary | ICD-10-CM

## 2020-09-11 DIAGNOSIS — R5383 Other fatigue: Secondary | ICD-10-CM

## 2020-09-11 DIAGNOSIS — K862 Cyst of pancreas: Secondary | ICD-10-CM

## 2020-09-11 DIAGNOSIS — E782 Mixed hyperlipidemia: Secondary | ICD-10-CM

## 2020-09-11 DIAGNOSIS — I639 Cerebral infarction, unspecified: Secondary | ICD-10-CM

## 2020-09-11 LAB — COMPREHENSIVE METABOLIC PANEL
ALT: 15 U/L (ref 0–55)
AST (SGOT): 16 U/L (ref 5–34)
Albumin/Globulin Ratio: 1.2 (ref 0.9–2.2)
Albumin: 3.7 g/dL (ref 3.5–5.0)
Alkaline Phosphatase: 98 U/L (ref 37–117)
Anion Gap: 8 (ref 5.0–15.0)
BUN: 19 mg/dL (ref 7.0–19.0)
Bilirubin, Total: 0.4 mg/dL (ref 0.2–1.2)
CO2: 29 mEq/L (ref 21–29)
Calcium: 8.9 mg/dL (ref 8.5–10.5)
Chloride: 103 mEq/L (ref 100–111)
Creatinine: 1 mg/dL (ref 0.4–1.5)
Globulin: 3.2 g/dL (ref 2.0–3.7)
Glucose: 149 mg/dL — ABNORMAL HIGH (ref 70–100)
Potassium: 4.3 mEq/L (ref 3.5–5.1)
Protein, Total: 6.9 g/dL (ref 6.0–8.3)
Sodium: 140 mEq/L (ref 136–145)

## 2020-09-11 LAB — TSH: TSH: 2.4 u[IU]/mL (ref 0.35–4.94)

## 2020-09-11 LAB — LIPID PANEL
Cholesterol / HDL Ratio: 4.6
Cholesterol: 215 mg/dL — ABNORMAL HIGH (ref 0–199)
HDL: 47 mg/dL (ref 40–9999)
LDL Calculated: 139 mg/dL — ABNORMAL HIGH (ref 0–99)
Triglycerides: 143 mg/dL (ref 34–149)
VLDL Calculated: 29 mg/dL (ref 10–40)

## 2020-09-11 LAB — CBC AND DIFFERENTIAL
Absolute NRBC: 0 10*3/uL (ref 0.00–0.00)
Basophils Absolute Automated: 0.04 10*3/uL (ref 0.00–0.08)
Basophils Automated: 0.5 %
Eosinophils Absolute Automated: 0.07 10*3/uL (ref 0.00–0.44)
Eosinophils Automated: 0.9 %
Hematocrit: 40.2 % (ref 34.7–43.7)
Hgb: 12.5 g/dL (ref 11.4–14.8)
Immature Granulocytes Absolute: 0.02 10*3/uL (ref 0.00–0.07)
Immature Granulocytes: 0.3 %
Lymphocytes Absolute Automated: 2.2 10*3/uL (ref 0.42–3.22)
Lymphocytes Automated: 27.5 %
MCH: 27.7 pg (ref 25.1–33.5)
MCHC: 31.1 g/dL — ABNORMAL LOW (ref 31.5–35.8)
MCV: 89.1 fL (ref 78.0–96.0)
MPV: 13.7 fL — ABNORMAL HIGH (ref 8.9–12.5)
Monocytes Absolute Automated: 0.52 10*3/uL (ref 0.21–0.85)
Monocytes: 6.5 %
Neutrophils Absolute: 5.14 10*3/uL (ref 1.10–6.33)
Neutrophils: 64.3 %
Nucleated RBC: 0 /100 WBC (ref 0.0–0.0)
Platelets: 147 10*3/uL (ref 142–346)
RBC: 4.51 10*6/uL (ref 3.90–5.10)
RDW: 14 % (ref 11–15)
WBC: 7.99 10*3/uL (ref 3.10–9.50)

## 2020-09-11 LAB — MICROALBUMIN, RANDOM URINE
Urine Creatinine, Random: 131.1 mg/dL
Urine Microalbumin, Random: 15 (ref 0.0–30.0)
Urine Microalbumin/Creatinine Ratio: 11 ug/mg (ref 0–30)

## 2020-09-11 LAB — HEMOGLOBIN A1C
Average Estimated Glucose: 180 mg/dL
Hemoglobin A1C: 7.9 % — ABNORMAL HIGH (ref 4.6–5.9)

## 2020-09-11 LAB — HEMOLYSIS INDEX: Hemolysis Index: 10 (ref 0–24)

## 2020-09-11 LAB — GFR: EGFR: 55.3

## 2020-09-14 ENCOUNTER — Other Ambulatory Visit (INDEPENDENT_AMBULATORY_CARE_PROVIDER_SITE_OTHER): Payer: Self-pay | Admitting: Internal Medicine

## 2020-09-14 ENCOUNTER — Ambulatory Visit (INDEPENDENT_AMBULATORY_CARE_PROVIDER_SITE_OTHER): Payer: Medicare Other | Admitting: Family Medicine

## 2020-09-14 ENCOUNTER — Encounter (INDEPENDENT_AMBULATORY_CARE_PROVIDER_SITE_OTHER): Payer: Self-pay | Admitting: Family Medicine

## 2020-09-14 VITALS — BP 155/90 | HR 58 | Temp 98.0°F | Resp 16 | Wt 212.0 lb

## 2020-09-14 DIAGNOSIS — E1165 Type 2 diabetes mellitus with hyperglycemia: Secondary | ICD-10-CM

## 2020-09-14 DIAGNOSIS — E1142 Type 2 diabetes mellitus with diabetic polyneuropathy: Secondary | ICD-10-CM

## 2020-09-14 DIAGNOSIS — R140 Abdominal distension (gaseous): Secondary | ICD-10-CM

## 2020-09-14 DIAGNOSIS — K862 Cyst of pancreas: Secondary | ICD-10-CM

## 2020-09-14 DIAGNOSIS — E782 Mixed hyperlipidemia: Secondary | ICD-10-CM

## 2020-09-14 DIAGNOSIS — I69359 Hemiplegia and hemiparesis following cerebral infarction affecting unspecified side: Secondary | ICD-10-CM

## 2020-09-14 DIAGNOSIS — E669 Obesity, unspecified: Secondary | ICD-10-CM

## 2020-09-14 DIAGNOSIS — I10 Essential (primary) hypertension: Secondary | ICD-10-CM

## 2020-09-14 MED ORDER — GLIPIZIDE ER 10 MG PO TB24
10.0000 mg | ORAL_TABLET | Freq: Every day | ORAL | 4 refills | Status: DC
Start: 2020-09-14 — End: 2020-09-16

## 2020-09-14 NOTE — Progress Notes (Signed)
Have you seen any specialists/other providers since your last visit with [place]?    Yes, Gastro    Arm preference verified?   Yes    The patient is due for falls risk screening, shingles vaccine and Medicare Annual Wellness and Advanced Directives        Date: 09/14/2020 8:23 AM   Patient ID: Emily Rowe is a 69 y.o. female.    Chief Complaint:  Chief Complaint   Patient presents with    Follow-up     bloating       HPI:  69 year old female with diabetes, stroke, hypertension, recent hospitalization status post colonoscopy for perforation in for follow-up   Since her last visit she had followed up with gastroenterologist  She has a follow-up colonoscopy according to her this Thursday  Still feeling bloated  No abdominal pain    Since last visit taking her pravastatin without any side effects  She is happy able to tolerate this medication as previous statins was given her muscle aches    Diabetes-she uses insulin only as needed  Currently taking glipizide 10 mg by mouth once a day, Tradjenta 5 mg  Seeing endocrinologist        Seen psychologist and then psychiatrist- Dr Melonie [place]- [facility]  On Pristiq   no s/e     seeing cardiologist        S/p colonoscopy had bowel perforation  Seen Dr Molli Hazard scriven- surgeon- had swallow endoscopy      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  Dr [name] sent me a note:  Edrick Oh, MD  Rebecca Eaton, MD  1. Virtual colonoscopy suboptimal distension but negative. Probably would do a yearly FIT for f/u on this pt given hx   2. 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 Would repeat MR in 2 y     Since last stroke been dizzi a lot  Last 3 days feel more dizziness  On and off headache  The patient has stable chronic, essential HTN with no evidence of CHF or proteinuria.  The patient is compliant with anti-hypertensive therapy and denies side effects to therapy.  Pt denies CP, SOB, dizziness, orthopnea, PND or edema.  Currently  she is taking verapamil 120 mg in the morning and losartan 50 mg two in the evening        The other 14 point review systems are negative except those in HPI.    Current Medications:  Outpatient Medications Marked as Taking for the 09/14/20 encounter (Office Visit) with Rebecca Eaton, MD   Medication Sig Dispense Refill    aspirin 81 MG chewable tablet Chew 81 mg by mouth daily TAKES SOME DAYS        desvenlafaxine (PRISTIQ) 50 MG 24 hr tablet Take 50 mg by mouth daily with lunch         glucose blood test strip Check sugar three times a day, ICD 10 E11.9 100 each 10    losartan (COZAAR) 50 MG tablet Take 1 tablet (50 mg total) by mouth 2 (two) times daily (Patient taking differently: Take 100 mg by mouth 2 (two) times daily   ) 180 tablet 1    pravastatin (PRAVACHOL) 20 MG tablet Take 1 tablet (20 mg total) by mouth daily 90 tablet 0    Tradjenta 5 MG Tab TAKE 1 TABLET(5 MG) BY MOUTH DAILY 90 tablet 1    [DISCONTINUED] glipiZIDE (GLUCOTROL) 10 MG 24  hr tablet Take 1 tablet (10 mg total) by mouth daily 90 tablet 4       Past Medical History:  Past Medical History:   Diagnosis Date    Anxiety     Asthma     allergic reaction to mold, not since 20 years    Bowel perforation     COLONOSCOPY 09/2019    Depression     Diabetes     Difficulty walking     WEAK RIGHT LEG AFTER CVA 2021    Dysphagia     BIG PILLS ONLY     Gastroesophageal reflux disease     PREVACID OTC PRN     Hyperlipemia     Hypertensive disorder     Hypokalemia     CMP REQ PCP 09/14/20    Neuropathy     NUMBNESS RIGHT UE AND LE     Post-operative nausea and vomiting     Sleep apnea 2017    NO CPAP NEEDED PER PT     Stroke 2012     ARM, LEG WEAK. WALKS WITH CANE, WALKER    TIA (transient ischemic attack)     MULTIPLE BEFORE CVA 2012    Type 2 diabetes mellitus, controlled 2021    MANAGED BY DR Binnie Rail RAO. AM FSB 90-170. TAKES LANTUS PRN IN AM. HAIC REQ         Past Surgical History:  Past Surgical History:   Procedure  Laterality Date    COLONOSCOPY      X 2    HYSTERECTOMY      REDUCTION MAMMAPLASTY      TONSILLECTOMY      urinary bladder      BLADDER LIFT        Family History:  Family History   Problem Relation Age of Onset    Diabetes Mother     Heart attack Mother     Diabetes Maternal Grandmother     Stroke Maternal Grandmother     Breast cancer Daughter     Stroke Maternal Grandfather        Social History:   reports that she has never smoked. She has never used smokeless tobacco. She reports that she does not use drugs.    Allergies:  Allergies   Allergen Reactions    Codeine Nausea And Vomiting    Crestor [Rosuvastatin] Rash     D/c due to severe rash and higher blood sugars    Glucosamine Hives and Nausea And Vomiting    Latex Rash    Tree Nuts Hives     ESPECIALLY ALMONDS     Eggs Or Egg-Derived Products      Patient had an allergy test and stated that she was allergic to eggs    Gabapentin      Dizziness and rash, irregular heart rate    Invokana [Canagliflozin]     Levemir [Insulin Detemir] Nausea And Vomiting    Lyrica [Pregabalin]      PT STATES IT MAKES HER NEUROPATHY WORSE STATES SHE FEELS LIKE SHE IS ON FIRE    Metformin      diarrhea    Evaristo Bury [Insulin Degludec]     Victoza [Liraglutide] Nausea And Vomiting    Flaxseed [Linseed Oil] Rash    Omega 3 [Fish Oil] Rash    Peanut-Containing Drug Products Rash       The following sections were reviewed this encounter by the provider:   Tobacco   Allergies   Meds  Problems   Med Hx   Surg Hx   Fam Hx          VITALS:  Vitals:    09/14/20 1108   BP: 155/90   BP Site: Right arm   Patient Position: Sitting   Cuff Size: Large   Pulse: (!) 58   Resp: 16   Temp: 98 F (36.7 C)   SpO2: 93%   Weight: 96.2 kg (212 lb)        ROS:  General/Constitutional:   Denies Chills. Denies Fatigue. Denies Fever.   Ophthalmologic:   Denies Blurred vision. Denies Eye Pain.   ENT:   Denies Nasal Discharge. Denies Ear pain. Denies Sinus pain.   Endocrine:   Denies  Polydipsia. Denies Polyuria.   Respiratory:   Denies Cough. Denies Orthopnea. Denies Shortness of breath. Denies Wheezing.   Cardiovascular:   Denies Chest pain. Denies Chest pain with exertion. Denies Leg Claudication. Denies      Palpitations. Denies Swelling in hands/feet.   Gastrointestinal:   Denies Abdominal pain. Denies Blood in stool. Denies Constipation. Denies Diarrhea.      Denies Heartburn. Denies Nausea. Denies Vomiting.   Genitourinary:   Denies Blood in urine. Denies Frequent urination. Denies Painful urination.   Musculoskeletal:   + Leg cramps. + Muscle aches.   Skin:   Denies Skin lesion(s).   Neurologic:   Denies Dizziness. +Gait abnormality. Denies Headache. +  Tingling/Numbness.     Physical Exam:  Alert and oriented. Affect is normal and appropriate. Clear thought process with normal reasoning and conversational tone and logic. No psychomotor retardation. Not in any acute distress  Skin: no rash or abnormal lesions  Eyes - normal eye movements.  Pupils equal and reactive to light  Eyelid normal on exam  Ears - TM clear, external ear normal  Nose - no acute changes  Throat - Oral mucosa moist and pink, no abnormality noted  Neck- No thyroid enlargement noted, no palpable cervical lymph nodes  HEART: S1S2 heard, regular rate and rhythm  LUNGS: good air exchange, no crackles or ronchi or wheezing  ABDOMEN: Positive bowel sounds, no hepatospleenomegaly appreciated, no tenderness   Back: Normal curvature, no tenderness.   Extremities: FROM, no deformities, no edema, no erythema.   No knee effusion.   mild tendernesson the joint line.  Neuro: Physiological, speech better than before.   Right side weakness.   Restricted and only able to abduct <20%, Right leg weakness.   Strength RUE and RLE 3/5.         Skin: Normal, no rashes, no lesions noted.   Extremities: Warm, well perfused, no edema. Fingers stiff with some contracture L> R  Extremity-no open wound    Lab Results   Component Value Date     WBC 7.99 09/11/2020    HGB 12.5 09/11/2020    HCT 40.2 09/11/2020    PLT 147 09/11/2020    CHOL 215 (H) 09/11/2020    TRIG 143 09/11/2020    HDL 47 09/11/2020    LDL 139 (H) 09/11/2020    ALT 15 09/11/2020    AST 16 09/11/2020    NA 140 09/11/2020    K 4.3 09/11/2020    CL 103 09/11/2020    CREAT 1.0 09/11/2020    BUN 19.0 09/11/2020    CO2 29 09/11/2020    TSH 2.40 09/11/2020    INR 1.0 05/10/2019    GLU 149 (H) 09/11/2020    HGBA1C 7.9 (H) 09/11/2020  Assessment/Plan:       1. Diabetic peripheral neuropathy    2. Essential hypertension    3. History of hemorrhagic stroke with residual hemiparesis    4. Mixed hyperlipidemia    5. Pancreas cyst    6. Obesity (BMI 30-39.9)    7. Uncontrolled type 2 diabetes mellitus with hyperglycemia    8. Bloating      Patient currently has no bloating signs and symptoms  She is following up closely with a gastroenterologist and colorectal surgeon    Hyperlipidemia-chronic  Need to be on a statin  Patient has been not taking medications because of side effects-last visit she agreed to try pravastatin and has been tolerating it  Lipid panel discussed with patient-improving LDL but still high-has been only on pravastatin 2 months-we will recheck it in 3 months    Diabetes-slightly elevated A1c at 7.9  Continue glipizide and Tradjenta and follow-up with her endocrinologist Dr.  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 pylori    Importance of getting her shingles vaccine discussed-she will do it at outside pharmacy  Also recommended getting her third COVID-19 vaccine-she wants to do it after her follow-up with  gastroenterologist    Return in about 2 months (around 11/14/2020) for DM fu , fu after GI w/u.    This note was generated by the Epic EMR system/ Dragon speech recognition and may contain inherent errors or omissions not intended by the user. Grammatical errors, random word insertions, deletions, pronoun errors and incomplete sentences are occasional consequences of this technology due to software limitations. Not all errors are caught or corrected. If there are questions or concerns about the content of this note or information contained within the body of this dictation they should be addressed directly with the author for clarification      Rebecca Eaton, MD

## 2020-09-14 NOTE — Telephone Encounter (Signed)
Pt LM stating that she needs rx sent for her to her pharmacy.    LM on FVDM 09/09/20 @1 :47pm  Ph: [phone]

## 2020-09-14 NOTE — Telephone Encounter (Signed)
Please send medication to

## 2020-09-15 ENCOUNTER — Other Ambulatory Visit (INDEPENDENT_AMBULATORY_CARE_PROVIDER_SITE_OTHER): Payer: Self-pay | Admitting: Cardiovascular Disease

## 2020-09-16 ENCOUNTER — Ambulatory Visit (INDEPENDENT_AMBULATORY_CARE_PROVIDER_SITE_OTHER): Payer: Medicare Other

## 2020-09-16 ENCOUNTER — Encounter (INDEPENDENT_AMBULATORY_CARE_PROVIDER_SITE_OTHER): Payer: Self-pay | Admitting: Family Medicine

## 2020-09-16 ENCOUNTER — Other Ambulatory Visit (INDEPENDENT_AMBULATORY_CARE_PROVIDER_SITE_OTHER): Payer: Self-pay

## 2020-09-16 DIAGNOSIS — E1165 Type 2 diabetes mellitus with hyperglycemia: Secondary | ICD-10-CM

## 2020-09-16 DIAGNOSIS — Z01818 Encounter for other preprocedural examination: Secondary | ICD-10-CM

## 2020-09-16 NOTE — Telephone Encounter (Signed)
Refill request     Last refill was printed

## 2020-09-16 NOTE — Anesthesia Preprocedure Evaluation (Addendum)
Anesthesia Evaluation    AIRWAY    Mallampati: II    TM distance: >3 FB  Neck ROM: full  Mouth Opening:full   CARDIOVASCULAR    regular and normal       DENTAL         PULMONARY    clear to auscultation     OTHER FINDINGS                  Relevant Problems   NEURO/PSYCH   (+) History of hemorrhagic stroke with residual hemiparesis   (+) Stroke      CARDIO   (+) Essential hypertension      ENDO   (+) Type 2 diabetes mellitus treated with insulin     Allergies:  Allergies   Allergen Reactions    Codeine Nausea And Vomiting    Crestor [Rosuvastatin] Rash     D/c due to severe rash and higher blood sugars    Glucosamine Hives and Nausea And Vomiting    Latex Rash    Tree Nuts Hives     ESPECIALLY ALMONDS     Eggs Or Egg-Derived Products      Patient had an allergy test and stated that she was allergic to eggs    Gabapentin      Dizziness and rash, irregular heart rate    Invokana [Canagliflozin]     Levemir [Insulin Detemir] Nausea And Vomiting    Lyrica [Pregabalin]      PT STATES IT MAKES HER NEUROPATHY WORSE STATES SHE FEELS LIKE SHE IS ON FIRE    Metformin      diarrhea    Evaristo Bury [Insulin Degludec]     Victoza [Liraglutide] Nausea And Vomiting    Flaxseed [Linseed Oil] Rash    Omega 3 [Fish Oil] Rash    Peanut-Containing Drug Products Rash     HPI:  HTN  CVA (11/2010 residual hemiparesis)  DM2  Asthma  OSA (per pt CPAP was not recommended)  Obesity    EKG: 09/16/20 NSR    ECHO: 09/2015, EF 65%, grade 1 diastolic dysfunction, no hemodynamically significant valvular pathology    Labs  Lab Results   Component Value Date    WBC 7.99 09/11/2020    HGB 12.5 09/11/2020    HCT 40.2 09/11/2020    PLT 147 09/11/2020    ALT 15 09/11/2020    AST 16 09/11/2020    NA 140 09/11/2020    K 4.3 09/11/2020    CL 103 09/11/2020    CO2 29 09/11/2020    CREAT 1.0 09/11/2020    BUN 19.0 09/11/2020    TSH 2.40 09/11/2020    PT 12.8 05/10/2019    PTT 28 05/10/2019    INR 1.0 05/10/2019    GLU 149 (H) 09/11/2020    HGBA1C 7.9  (H) 09/11/2020    MG 1.8 07/25/2019                 Anesthesia Plan    ASA 3     general                     intravenous induction   Detailed anesthesia plan: general IV            informed consent obtained    Plan discussed with CRNA.    ECG reviewed  pertinent labs reviewed             Signed by: Serita Kyle, MD 09/16/20 12:05  PM

## 2020-09-16 NOTE — Progress Notes (Signed)
EKG Scanned into chart and faxed to the Pre Surgical Office. Dr. Janalyn Harder looked at EKG in Dr. Loleta Chance absence.

## 2020-09-17 ENCOUNTER — Ambulatory Visit: Payer: Medicare Other | Admitting: Anesthesiology

## 2020-09-17 ENCOUNTER — Ambulatory Visit
Admission: RE | Admit: 2020-09-17 | Discharge: 2020-09-17 | Disposition: A | Discharge status: Home / Self Care | Payer: Medicare Other | Source: Ambulatory Visit | Attending: Gastroenterology | Admitting: Gastroenterology

## 2020-09-17 ENCOUNTER — Encounter
Admission: RE | Disposition: A | Discharge status: Home / Self Care | Payer: Self-pay | Source: Ambulatory Visit | Attending: Gastroenterology

## 2020-09-17 DIAGNOSIS — I10 Essential (primary) hypertension: Secondary | ICD-10-CM | POA: Insufficient documentation

## 2020-09-17 DIAGNOSIS — Z1211 Encounter for screening for malignant neoplasm of colon: Secondary | ICD-10-CM | POA: Insufficient documentation

## 2020-09-17 DIAGNOSIS — E1165 Type 2 diabetes mellitus with hyperglycemia: Secondary | ICD-10-CM | POA: Insufficient documentation

## 2020-09-17 DIAGNOSIS — K219 Gastro-esophageal reflux disease without esophagitis: Secondary | ICD-10-CM | POA: Insufficient documentation

## 2020-09-17 DIAGNOSIS — Z8673 Personal history of transient ischemic attack (TIA), and cerebral infarction without residual deficits: Secondary | ICD-10-CM | POA: Insufficient documentation

## 2020-09-17 DIAGNOSIS — Z794 Long term (current) use of insulin: Secondary | ICD-10-CM | POA: Insufficient documentation

## 2020-09-17 DIAGNOSIS — G4730 Sleep apnea, unspecified: Secondary | ICD-10-CM | POA: Insufficient documentation

## 2020-09-17 DIAGNOSIS — K5730 Diverticulosis of large intestine without perforation or abscess without bleeding: Secondary | ICD-10-CM | POA: Insufficient documentation

## 2020-09-17 DIAGNOSIS — Z86010 Personal history of colonic polyps: Secondary | ICD-10-CM | POA: Insufficient documentation

## 2020-09-17 DIAGNOSIS — Z8249 Family history of ischemic heart disease and other diseases of the circulatory system: Secondary | ICD-10-CM | POA: Insufficient documentation

## 2020-09-17 HISTORY — DX: Dysphagia, unspecified: R13.10

## 2020-09-17 HISTORY — PX: COLONOSCOPY, DIAGNOSTIC (SCREENING): SHX174

## 2020-09-17 HISTORY — DX: Nausea with vomiting, unspecified: R11.2

## 2020-09-17 HISTORY — DX: Other specified postprocedural states: Z98.890

## 2020-09-17 HISTORY — DX: Difficulty in walking, not elsewhere classified: R26.2

## 2020-09-17 HISTORY — DX: Perforation of intestine (nontraumatic): K63.1

## 2020-09-17 HISTORY — DX: Gastro-esophageal reflux disease without esophagitis: K21.9

## 2020-09-17 LAB — GLUCOSE WHOLE BLOOD - POCT: Whole Blood Glucose POCT: 117 mg/dL — ABNORMAL HIGH (ref 70–100)

## 2020-09-17 SURGERY — DONT USE, USE 1094-COLONOSCOPY, DIAGNOSTIC (SCREENING)
Anesthesia: Anesthesia General | Site: Anus

## 2020-09-17 MED ORDER — PROPOFOL INFUSION 10 MG/ML
INTRAVENOUS | Status: DC | PRN
Start: 2020-09-17 — End: 2020-09-17
  Administered 2020-09-17: 140 ug/kg/min via INTRAVENOUS

## 2020-09-17 MED ORDER — LACTATED RINGERS IV SOLN
INTRAVENOUS | Status: DC
Start: 2020-09-17 — End: 2020-09-17
  Administered 2020-09-17: 1000 mL via INTRAVENOUS

## 2020-09-17 MED ORDER — LIDOCAINE 1% BUFFERED - CNR/OUTSOURCED
0.3000 mL | Freq: Once | INTRAMUSCULAR | Status: AC
Start: 2020-09-17 — End: 2020-09-17
  Administered 2020-09-17: 0.3 mL via INTRADERMAL

## 2020-09-17 MED ORDER — PROPOFOL 10 MG/ML IV EMUL (WRAP)
INTRAVENOUS | Status: AC
Start: 2020-09-17 — End: ?
  Filled 2020-09-17: qty 50

## 2020-09-17 MED ORDER — LIDOCAINE HCL 2 % IJ SOLN
INTRAMUSCULAR | Status: DC | PRN
Start: 2020-09-17 — End: 2020-09-17
  Administered 2020-09-17: 100 mg via INTRAVENOUS

## 2020-09-17 MED ORDER — LIDOCAINE HCL (PF) 2 % IJ SOLN
INTRAMUSCULAR | Status: AC
Start: 2020-09-17 — End: ?
  Filled 2020-09-17: qty 5

## 2020-09-17 MED ORDER — PROPOFOL 10 MG/ML IV EMUL (WRAP)
INTRAVENOUS | Status: DC | PRN
Start: 2020-09-17 — End: 2020-09-17
  Administered 2020-09-17: 50 mg via INTRAVENOUS
  Administered 2020-09-17: 20 mg via INTRAVENOUS

## 2020-09-17 SURGICAL SUPPLY — 78 items
AMPOULE ELEVIEW 10ML (Endoscopic Supplies)
BASIN EME PLS 700ML LF GRAD TRNLU PGMNT (Patient Supply)
BASIN EMESIS 700 ML GRADUATED TRANSLUCENT PIGMENT FREE PLASTIC (Patient Supply) IMPLANT
BASIN EMESIS LF (Patient Supply)
BRUSH ENDOSCOPIC CLEANING SLIM 2 BUNDLE (Endoscopic Supplies) ×1
BRUSH ENDOSCOPIC CLEANING SLIM 2 BUNDLE CATHETER ROBUST VALVE OD5 MM (Endoscopic Supplies) ×1 IMPLANT
BRUSH HEDGEHOG ENDO DBL END (Endoscopic Supplies) ×1
CATHETER INJ. GOLD PROBE 10FR (Catheter Micellaneous)
CATHETER OD10 FR ODSEC25 GA ID.24 MM (Catheter Micellaneous)
CATHETER OD10 FR ODSEC25 GA ID.24 MM L210 CM BIPOLAR ROUND DISTAL TIP (Catheter Miscellaneous) IMPLANT
CONTAINER HISTOLOGY 60 ML 30 ML GRADUATE LEAK RESISTANT O RING PREFILL (Procedure Accessories) IMPLANT
ELECTRODE ADULT PATIENT RETURN L9 FT REM POLYHESIVE ACRYLIC FOAM (Procedure Accessories) IMPLANT
ELECTRODE PATIENT RETURN L9 FT VALLEYLAB (Procedure Accessories)
FORCEP HOT BIOPSY RJ4 (Endoscopic Supplies)
FORCEPS BIOPSY L240 CM +2.8 MM HOT OD2.2 (Endoscopic Supplies)
FORCEPS BIOPSY L240 CM +2.8 MM HOT OD2.2 MM RADIAL JAW (Endoscopic Supplies) IMPLANT
FORCEPS BIOPSY L240 CM JUMBO MICROMESH (Instrument)
FORCEPS BIOPSY L240 CM JUMBO MICROMESH TEETH STREAMLINE CATHETER (Instrument) IMPLANT
FORCEPS BIOPSY L240 CM LARGE CAPACITY (Instrument)
FORCEPS BIOPSY L240 CM MICROMESH TEETH STREAMLINE CATHETER NEEDLE (Instrument) IMPLANT
FORCEPS BIOPSY L240 CM STANDARD CAPACITY (Instrument)
FORCEPS JAW RADIAL JUMBO (Instrument)
FORCEPS RAD JAW 4 BIOSPY W/NDL (Instrument)
FORCEPS RADIAL JAW 4 2.8MM (Instrument)
GOWN ISO LVL2 YEL REG SZ (Gown)
GOWN ISOLATION REGULAR LARGE LEVEL 2 (Gown)
GOWN ISOLATION REGULAR LARGE LEVEL 2 FULL BACK OVERHEAD THUMB HOOK SMS (Gown) IMPLANT
GOWN ISOLATION RGLR LG LVL 2 FLL BCK OVERHEAD THMB HK MEDLN SMS YELLOW (Gown) IMPLANT
GOWN ISOLATION XL HOOK LOOP NECK KNIT (Patient Supply) ×2 IMPLANT
GOWN XL - NON STERILE (Patient Supply) ×2
KIT ENDO W/ FOUR PACK BUTTONS (Kits) ×1
KIT ENDOSCOPIC COMPLIANCE ENDOKIT (Kits) ×1
KIT ENDOSCOPIC COMPLIANCE ENDOKIT ORCAPOD 4 1.1 OZ (Kits) ×1 IMPLANT
LIFTER SRG 10ML ELEVIEW 5X5.1X1.2IN INJ (Endoscopic Supplies) IMPLANT
MANIFOLD NEPTUNE II 4PORT SUCT (Filter) ×1
MANIFOLD SUCTION 2 STANDARD 4 PORT (Filter) ×1
MANIFOLD SUCTION 2 STANDARD 4 PORT NEPTUNE 2 WASTE MANAGEMENT SYSTEM (Filter) ×1 IMPLANT
MARKER ENDOSCOPIC PERMANENT INDICATION (Syringes, Needles)
MARKER ENDOSCOPIC PERMANENT INDICATION DARK SYRINGE SPOT EX 5 ML (Syringes, Needles) IMPLANT
NEEDLE CARR-LOCKE INJECT 25GX5 (Needles)
NEEDLE SCLEROTHERAPY CARR-LOCKE OD25 GA ODSEC2.5 MM L230 CM INJECTION (Needles) IMPLANT
NEEDLE SCLEROTHERAPY OD25 GA ODSEC2.5 MM (Needles)
NET SPECIMEN RETRIEVAL L230 CM STANDARD (Urology Supply)
NET SPECIMEN RETRIEVAL L230 CM STANDARD SHEATH OD2.5 MM L6 CM X W3 CM (Urology Supply) IMPLANT
PAD CINCHPAD ENDO TRANSPORT (Procedure Accessories)
PAD ELECTROSRG GRND REM W CRD (Procedure Accessories)
PAD TRANSPORT L42 IN X W17 IN (Procedure Accessories)
PAD TRANSPORT L42 IN X W17 IN TRANSLUCENT LEAK PROOF ABSORBENT (Procedure Accessories) IMPLANT
PROBE COAGULATION L7.2 FT (Endoscopic Supplies)
PROBE COAGULATION L7.2 FT CIRCUMFERENTIAL PLUG PLAY FUNCTIONALITY (Endoscopic Supplies) IMPLANT
PROBE ELECTROSURGICAL L220 CM FLEXIBLE (Procedure Accessories)
PROBE ELECTROSURGICAL L220 CM FLEXIBLE STRAIGHT FIRE OD2.3 MM FIAPC (Procedure Accessories) IMPLANT
PROBE FIAPC CIRC 220MM (Endoscopic Supplies)
PROBE FIAPC STRGHT FIRE 220CM (Procedure Accessories)
RETRIEVER ROTH NET STD 2.5MM (Urology Supply)
SNARE 2.8 CM ROUND L240 CM OD10 MM (Endoscopic Supplies)
SNARE 2.8 CM ROUND L240 CM OD10 MM ODSEC2.4 MM CAPTIVATOR LOOP STIFF (Endoscopic Supplies) IMPLANT
SNARE 9 MM L230 CM OD2.4 MM COLD BRAID (Instrument)
SNARE 9 MM L230 CM OD2.4 MM EXACTO COLD BRAID WIRE CLEAN CUT (Instrument) IMPLANT
SNARE CAPTIFLEX 27MM (Endoscopic Supplies)
SNARE CAPTIVATOR 13MMX240CM (GE Lab Supplies)
SNARE CAPTVTR II RND STFF 10MM (Endoscopic Supplies)
SNARE ESCP MIC CPTVTR 13MM 240IN STRL (GE Lab Supplies)
SNARE EXACTO COLD 2.4MMX230CM (Instrument)
SNARE MD OVAL 240CM 2.4 MM CPTFLX LP FLXBL ENDOSCOPIC POLYPECTOMY 27MM (Endoscopic Supplies) IMPLANT
SNARE MEDIUM OVAL L240 CM OD2.4 MM (Endoscopic Supplies)
SNARE SMALL HEXAGON CAPTIVATOR STIFF ENDOSCOPIC POLYPECTOMY (GE Lab Supplies) IMPLANT
SOL FORMALIN 10% PREFILL 30ML (Procedure Accessories)
SYRING SPOT EX ENDO MARK 5CC (Syringes, Needles)
SYRINGE 50 ML GRADUATE NONPYROGENIC DEHP (Syringes, Needles)
SYRINGE 50 ML GRADUATE NONPYROGENIC DEHP FREE PVC FREE BD MEDICAL (Syringes, Needles) IMPLANT
SYRINGE SLIP-TIP 60CC (Syringes, Needles)
TRAP INLINE SUCTION CHAMBER FLAT SURFACE (Endoscopic Supplies)
TRAP INLINE SUCTION CHAMBER FLAT SURFACE QUICK CATCH SPECIMEN JAR (Endoscopic Supplies) IMPLANT
TRAP QUICK CATCH IN-LINE (Endoscopic Supplies)
TUBING CONNECTING STERILE 10FT (Tubing) ×1
TUBING SUCTION ID3/16 IN L10 FT (Tubing) ×1
TUBING SUCTION ID3/16 IN L10 FT NONCONDUCTIVE STRAIGHT MALE FEMALE (Tubing) ×1 IMPLANT

## 2020-09-17 NOTE — Anesthesia Postprocedure Evaluation (Addendum)
Anesthesia Post Evaluation    Patient: Emily Rowe    Procedure(s):  COLONOSCOPY    Anesthesia type: general    Last Vitals:   Vitals Value Taken Time   BP 128/70 09/17/20 1448   Temp 36.1 C (97 F) 09/17/20 1448   Pulse 68 09/17/20 1448   Resp 18 09/17/20 1448   SpO2 95 % 09/17/20 1448                 Anesthesia Post Evaluation:     Patient Evaluated: PACU    Level of Consciousness: awake and lethargic  Pain Score: 0  Pain Management: adequate    Airway Patency: patent    Anesthetic complications: No      PONV Status: none    Cardiovascular status: acceptable  Respiratory status: acceptable, room air and spontaneous ventilation  Hydration status: acceptable        Signed by: Thea Alken, CRNA, 09/17/2020 2:48 PM

## 2020-09-17 NOTE — Transfer of Care (Signed)
Anesthesia Transfer of Care Note    Patient: Emily Rowe    Procedures performed: Procedure(s):  COLONOSCOPY    Anesthesia type: General TIVA    Patient location:Phase I PACU    Last vitals:   Vitals:    09/17/20 1448   BP: 128/70   Pulse: 68   Resp: 18   Temp: 36.1 C (97 F)   SpO2: 95%       Post pain: Patient not complaining of pain, continue current therapy      Mental Status:awake and lethargic    Respiratory Function: tolerating room air    Cardiovascular: stable    Nausea/Vomiting: patient not complaining of nausea or vomiting    Hydration Status: adequate    Post assessment: no apparent anesthetic complications, no reportable events, no evidence of recall and see quality assurance indicators    Signed by: Thea Alken, CRNA  09/17/20 2:48 PM

## 2020-09-17 NOTE — Discharge Instr - AVS First Page (Addendum)
Robert D. Lafsky, M.D. Post Endoscopy/Colonoscopy Instructions     1. After the procedure you may experience some intestinal gas, bloating, or cramping. If you experience any bleeding, abdominal pain, persistent nausea or vomiting, chest pain, difficulty breathing or swallowing, contact your physician or go th the emergency room immediately. There may be some inflammation at the site where intravenous medication was given, if this occurs you may apply a warm towel; call your physician if the condition worsens. If esophageal biopsies were done it is not uncommon  to have some mild chest or throat discomfort--this generally goes away in 1-3 days.      2. You should begin with a clear liquid diet after the procedure. Advance your diet after that as tolerated. Most likely you can eat a regular diet by tonight but avoid any heavy meals or fatty foods until at least tomorrow.    3. You should be able to resume normal activity (work, house chores, etc.) by tomorrow. DO NOT make important decisions, sign any important document or drive for at least 24 hours from receiving the anesthesia meds.    4. Medications:   In general you should resume most previous medications unless otherwise instructed.     However, if biopsies were taken and/or polyp(s) removed there are some limitations:   Do not take ibuprofen (Motrin, Advil), naproxen (Aleve), for 3 more days.    If you were told to stop aspirin, Plavix or Effient for your procedure then don't take them for 3 more days either, but if you were specifically told to stay on one of those because of your heart or blood clot history then continue to take it.     You may take Tylenol (Acetaminophen) which does not affect blood clotting.    If you were on Coumadin/warfarin and stopped it for the procedure, you can restart it today, since it takes 4-5 days for full effect.     Do not take Pradaxa or Xarelto for 3 more days.    5.  See the color photo report for results.  If no  biopsies/polyp removal reported that will serve as your record of the procedure with a recommendation for the next procedure.  We will track you in our database for a reminder at the appropriate time.      6. If the report indicates that biopsies and/or polyp removal were done, we will forward a letter to you in about 10 days with results and recommendations for followup.      7. Please report any abdominal pain, other severe pain or bleeding following your procedure.  If you have severe bleeding or pain go to the emergency room.  If you have to go to an emergency room other than the one here try to remember to take your report with you to help the doctor there.         Post Anesthesia Discharge Instructions    Although you may be awake and alert in the recovery room, small amounts of anesthetic remain in your system for about 24 hours.  You may feel tired and sleepy during this time.      You are advised to go directly home from the hospital.    Plan to stay at home and rest for the remainder of the day.    It is advisable to have someone with you at home for 24 hours after surgery.    Do not operate a motor vehicle, or any mechanical or electrical equipment   for the next 24 hours.      Be careful when you are walking around, you may become dizzy.  The effects of anesthesia and/or medications are still present and drowsiness may occur    Do not consume alcohol, tranquilizers, sleeping medications, or any other non prescribed medication for the remainder of the day.    Diet:  begin with liquids, progress your diet as tolerated or as directed by your surgeon.  Nausea and vomiting may occur in the next 24 hours.

## 2020-09-17 NOTE — PACU (Signed)
Pt support person updated on pt status; understanding verbalized.

## 2020-09-17 NOTE — H&P (Signed)
PREOP H&P    Date Time: 09/17/20 1:35 PM  Patient Name: Emily Rowe, Emily Rowe   [id number]  Attending Physician: Edrick Oh, MD    Assessment:   Pre-Op Diagnosis Codes:     * History of colon polyps [Z86.010]  Plan:   Procedure(s):  COLONOSCOPY.    Informed consent per documented office discussion and/or consent form.    Problem List:   There are no active hospital problems to display for this patient.    History of Present Illness:   Emily Rowe is a 69 y.o. female.  No change from chronic history recorded in office notes. Symptoms indications/procedure indications as per assessment section above. See office notes about previous procedure.  Dr. Aleen Sells aware of situation and available if major problems.  Will use water inflation with no gas.    Past Medical History:     Past Medical History:   Diagnosis Date    Anxiety     Asthma     allergic reaction to mold, not since 20 years    Bowel perforation     COLONOSCOPY 09/2019    Depression     Diabetes     Difficulty walking     WEAK RIGHT LEG AFTER CVA 2021    Dysphagia     BIG PILLS ONLY     Gastroesophageal reflux disease     PREVACID OTC PRN     Hyperlipemia     Hypertensive disorder     Hypokalemia     CMP REQ PCP 09/14/20    Neuropathy     NUMBNESS RIGHT UE AND LE     Post-operative nausea and vomiting     Sleep apnea 2017    NO CPAP NEEDED PER PT     Stroke 2012     ARM, LEG WEAK. WALKS WITH CANE, WALKER    TIA (transient ischemic attack)     MULTIPLE BEFORE CVA 2012    Type 2 diabetes mellitus, controlled 2021    MANAGED BY DR Binnie Rail RAO. AM FSB 90-170. TAKES LANTUS PRN IN AM. HAIC REQ       Past Surgical History:     Past Surgical History:   Procedure Laterality Date    COLONOSCOPY      X 2    HYSTERECTOMY      REDUCTION MAMMAPLASTY      TONSILLECTOMY      urinary bladder      BLADDER LIFT      Family History:     Family History   Problem Relation Age of Onset    Diabetes Mother     Heart attack Mother     Diabetes  Maternal Grandmother     Stroke Maternal Grandmother     Breast cancer Daughter     Stroke Maternal Grandfather      Social History:     Social History     Socioeconomic History    Marital status: Married     Spouse name: Not on file    Number of children: Not on file    Years of education: Not on file    Highest education level: Not on file   Occupational History    Not on file   Tobacco Use    Smoking status: Never Smoker    Smokeless tobacco: Never Used   Vaping Use    Vaping Use: Never used   Substance and Sexual Activity    Alcohol use: Not on file  Drug use: No    Sexual activity: Not Currently     Comment: married, not working ,   Other Topics Concern    Not on file   Social History Narrative    Not on file     Social Determinants of Health     Financial Resource Strain:     Difficulty of Paying Living Expenses: Not on file   Food Insecurity:     Worried About Programme researcher, broadcasting/film/video in the Last Year: Not on file    [employer] of Food in the Last Year: Not on file   Transportation Needs:     Lack of Transportation (Medical): Not on file    Lack of Transportation (Non-Medical): Not on file   Physical Activity:     Days of Exercise per Week: Not on file    Minutes of Exercise per Session: Not on file   Stress:     Feeling of Stress : Not on file   Social Connections:     Frequency of Communication with Friends and Family: Not on file    Frequency of Social Gatherings with Friends and Family: Not on file    Attends Religious Services: Not on file    Active Member of Clubs or Organizations: Not on file    Attends Banker Meetings: Not on file    Marital Status: Not on file   Intimate Partner Violence:     Fear of Current or Ex-Partner: Not on file    Emotionally Abused: Not on file    Physically Abused: Not on file    Sexually Abused: Not on file   Housing Stability:     Unable to Pay for Housing in the Last Year: Not on file    Number of Places Lived in the Last Year:  Not on file    Unstable Housing in the Last Year: Not on file     Allergies:     Allergies   Allergen Reactions    Codeine Nausea And Vomiting    Crestor [Rosuvastatin] Rash     D/c due to severe rash and higher blood sugars    Glucosamine Hives and Nausea And Vomiting    Latex Rash    Tree Nuts Hives     ESPECIALLY ALMONDS     Eggs Or Egg-Derived Products      Patient had an allergy test and stated that she was allergic to eggs    Gabapentin      Dizziness and rash, irregular heart rate    Invokana [Canagliflozin]     Levemir [Insulin Detemir] Nausea And Vomiting    Lyrica [Pregabalin]      PT STATES IT MAKES HER NEUROPATHY WORSE STATES SHE FEELS LIKE SHE IS ON FIRE    Metformin      diarrhea    Evaristo Bury [Insulin Degludec]     Victoza [Liraglutide] Nausea And Vomiting    Flaxseed [Linseed Oil] Rash    Omega 3 [Fish Oil] Rash    Peanut-Containing Drug Products Rash     Medications:     Current Discharge Medication List      CONTINUE these medications which have NOT CHANGED    Details   aspirin 81 MG chewable tablet Chew 81 mg by mouth daily TAKES SOME DAYS        desvenlafaxine (PRISTIQ) 50 MG 24 hr tablet Take 50 mg by mouth daily with lunch         glipiZIDE (GLUCOTROL)  10 MG 24 hr tablet Take 1 tablet (10 mg total) by mouth daily  Qty: 90 tablet, Refills: 4    Associated Diagnoses: Uncontrolled type 2 diabetes mellitus with hyperglycemia      glucose blood test strip Check sugar three times a day, ICD 10 E11.9  Qty: 100 each, Refills: 10    Associated Diagnoses: Uncontrolled type 2 diabetes mellitus with hyperglycemia; Diabetic peripheral neuropathy      losartan (COZAAR) 50 MG tablet Take 1 tablet (50 mg total) by mouth 2 (two) times daily  Qty: 180 tablet, Refills: 1    Associated Diagnoses: Essential hypertension      pravastatin (PRAVACHOL) 20 MG tablet Take 1 tablet (20 mg total) by mouth daily  Qty: 90 tablet, Refills: 0    Comments: Stopped zocor- d/c all refill  Associated Diagnoses: Mixed  hyperlipidemia      Tradjenta 5 MG Tab TAKE 1 TABLET(5 MG) BY MOUTH DAILY  Qty: 90 tablet, Refills: 1    Associated Diagnoses: Uncontrolled type 2 diabetes mellitus with hyperglycemia      verapamil ER (CALAN-SR) 120 MG CR tablet Take 1 tablet (120 mg total) by mouth nightly  Qty: 90 tablet, Refills: 1    Associated Diagnoses: Essential hypertension      insulin glargine (LANTUS SOLOSTAR) 100 UNIT/ML injection pen Inject 10 units into skin daily at bedtime  Qty: 15 mL, Refills: 0    Associated Diagnoses: Uncontrolled type 2 diabetes mellitus with hyperglycemia      LORazepam (ATIVAN) 0.5 MG tablet Take 0.5 tablets (0.25 mg total) by mouth nightly as needed for Anxiety  Qty: 20 tablet, Refills: 0    Associated Diagnoses: Anxiety           Review of Systems:   No change from previous documentation  Physical Exam:     Vitals:    09/17/20 1304   BP: 135/82   Pulse: 63   Resp: 18   Temp:    SpO2: 100%     Chest:  Clear with no abnormal breath sounds  Heart:  Regular rhythm, no significant murmurs.  Abdomen:  Soft, nontender, no mass or hepatosplenomegaly.  Neurologic: Alert and oriented, no obvious focal motor defects.    Recent Labs:     Recent Labs   Lab 09/11/20  0752   WBC 7.99   RBC 4.51   HGB 12.5   HCT 40.2   PLT 147   GLU 149*   BUN 19.0   CREAT 1.0   CA 8.9   NA 140   K 4.3   CL 103   CO2 29   ALB 3.7   ALT 15   AST 16   BILITOTAL 0.4   PROT 6.9       Signed by: Edrick Oh, MD

## 2020-09-18 ENCOUNTER — Encounter: Payer: Self-pay | Admitting: Gastroenterology

## 2020-09-20 MED ORDER — GLIPIZIDE ER 10 MG PO TB24
10.0000 mg | ORAL_TABLET | Freq: Every day | ORAL | 4 refills | Status: DC
Start: 2020-09-20 — End: 2020-10-07

## 2020-09-22 ENCOUNTER — Ambulatory Visit (INDEPENDENT_AMBULATORY_CARE_PROVIDER_SITE_OTHER): Payer: Medicare Other | Admitting: Internal Medicine

## 2020-09-23 ENCOUNTER — Encounter (INDEPENDENT_AMBULATORY_CARE_PROVIDER_SITE_OTHER): Payer: Self-pay

## 2020-09-24 ENCOUNTER — Encounter (INDEPENDENT_AMBULATORY_CARE_PROVIDER_SITE_OTHER): Payer: Self-pay

## 2020-09-25 ENCOUNTER — Other Ambulatory Visit: Payer: Self-pay | Admitting: Gastroenterology

## 2020-09-25 DIAGNOSIS — K862 Cyst of pancreas: Secondary | ICD-10-CM

## 2020-09-28 ENCOUNTER — Ambulatory Visit: Admission: RE | Admit: 2020-09-28 | Payer: Medicare Other | Source: Ambulatory Visit

## 2020-09-28 ENCOUNTER — Encounter (INDEPENDENT_AMBULATORY_CARE_PROVIDER_SITE_OTHER): Payer: Self-pay | Admitting: Family Medicine

## 2020-09-28 ENCOUNTER — Other Ambulatory Visit (INDEPENDENT_AMBULATORY_CARE_PROVIDER_SITE_OTHER): Payer: Self-pay | Admitting: Cardiovascular Disease

## 2020-09-28 DIAGNOSIS — I10 Essential (primary) hypertension: Secondary | ICD-10-CM

## 2020-09-29 ENCOUNTER — Ambulatory Visit (INDEPENDENT_AMBULATORY_CARE_PROVIDER_SITE_OTHER): Payer: Medicare Other | Admitting: Internal Medicine

## 2020-09-29 VITALS — BP 96/63 | HR 68 | Resp 16 | Wt 208.0 lb

## 2020-09-29 DIAGNOSIS — I10 Essential (primary) hypertension: Secondary | ICD-10-CM

## 2020-09-29 DIAGNOSIS — Z794 Long term (current) use of insulin: Secondary | ICD-10-CM

## 2020-09-29 DIAGNOSIS — Z6834 Body mass index (BMI) 34.0-34.9, adult: Secondary | ICD-10-CM

## 2020-09-29 DIAGNOSIS — E1165 Type 2 diabetes mellitus with hyperglycemia: Secondary | ICD-10-CM

## 2020-09-29 DIAGNOSIS — E782 Mixed hyperlipidemia: Secondary | ICD-10-CM

## 2020-09-29 DIAGNOSIS — E669 Obesity, unspecified: Secondary | ICD-10-CM

## 2020-09-29 NOTE — Progress Notes (Signed)
Subjective:      Date: 09/29/2020 12:23 PM   Patient ID: Emily Rowe is a 69 y.o. female.    Chief Complaint:  Chief Complaint   Patient presents with    Uncontrolled type 2 diabetes mellitus with hyperglycemia         HPI  Visit type: follow-up - type II DM   Diagnosed: over 5 years ago  Type: Insulin requiring  Complications: hyperglycemia  Control: inadequate control  Comorbid illness: hyperlipidemia  Last follow-up: 6 months ago    Diabetes Medication Regimen: Glipizide XL 10 mg daily, Tradjenta 5 mg daily , Lantus 10 units daily     Unable to tolerate Metformin due to diarrhea, unable to tolerate Victoza due to n/v, unable to tolerate Invokana. Taking potassium pill daily along with eating sweet potato and avocado everyday. Unable to tolerate Levemir due to episodes of nausea and vomiting. Not able to tolerate tresiba.    Home glucose readings:  FBG 99-100, 2H PPBG 134-140 mg/dl  Diabetic screening: within the last year, no known retinopathy  Podiatric assessment: admits LE burning/tingling due to hx of stroke   Exercise: walks occasionally   Diet: moderate compliance with recommended diet; eats more vegetables and protein   Response to therapy: with hemoglobin A1C above goal  A1c trend: improving     Lab Results   Component Value Date    HGBA1C 7.9 (H) 09/11/2020    HGBA1C 7.3 (A) 05/28/2020    HGBA1C 7.2 (A) 02/17/2020     Trend is decreasing  Additional concerns: In addition, pt has hyperlipidemia and is compliant with lipid therapy.  Pt denies side effects of lipid therapy - specifically denies myalgia., In addition, pt has stable essential HTN with no evidence of CHF or proteinuria.  The patient is compliant with anti-hypertensive therapy and denies side effects to therapy.  Pt denies CP, SOB, dizziness, orthopnea, PND or edema.     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     H/o severe hypokalemia which required ER visit; had been off lisinopril at that  time.  Notes allergic reaction soon after taking crestor.      Interval history: Elements of HPI were reviewed and updated. Reports consistent compliance with the listed medications for diabetes, hypertension and hyperlipidemia.  Weight has been stable. Reviewed lab results from 11/21 which showed A1c of 7.9% in the range of fairly well controlled diabetes. Elevated total cholesterol, LDL/ bad cholesterol levels. Unable to tolerate low dose statin.    Started taking prestiq as recommended by psychiatrist. Taking pravachol. Total cholesterol and LDL are high.    ROS:  A complete 12 point ROS was obtained. Pertinent positives and negatives as noted in HPI. All other systems are negative.      Vitals:  BP 96/63 (BP Site: Left arm, Patient Position: Sitting)    Pulse 68    Resp 16    Wt 94.3 kg (208 lb)    SpO2 96%    BMI 34.61 kg/m    Wt Readings from Last 3 Encounters:   09/29/20 94.3 kg (208 lb)   09/17/20 94.4 kg (208 lb 3.2 oz)   09/14/20 96.2 kg (212 lb)          Physical Exam   Constitutional: She is oriented to person, place, and time. She appears well-developed and well-nourished.   HENT:   Head: Normocephalic and atraumatic.   Mouth/Throat: Oropharynx is clear and moist.  Eyes: Conjunctivae and EOM are normal.   Neck: Normal range of motion. No thyromegaly present.   Neurological: She is alert and oriented to person, place, and time.   Psychiatric: She has a normal mood and affect.   Normal sensation on 10g monofilament testing B/L    Assessment and Plan     Diagnoses and plan were reviewed and updated    1. Uncontrolled type 2 diabetes mellitus with hyperglycemia  Lab Results   Component Value Date    HGBA1C 7.9 (H) 09/11/2020    HGBA1C 7.3 (A) 05/28/2020    HGBA1C 7.2 (A) 02/17/2020        Given h/o intolerance to tresiba, toujeo, basaglar. Lantus has been well tolerated. Significant interval improvement in glycemic control on the current medication regimen- Lantus, glipizide, tradjenta. Recommend  compliance with checking BG x2/ day. Recommend consistent compliance with lantus 10 units daily due to interval increase in BG readings.    - Hemoglobin A1C  - Comprehensive metabolic panel  - Lipid panel  - TSH  - Urine Microalbumin Random  - insulin glargine (LANTUS SOLOSTAR) 100 UNIT/ML injection pen; Inject 10 units into skin daily at bedtime  Dispense: 15 mL; Refill: 0    2. Benign hypertension  BP is well controlled on the current meds.    3. Mixed hyperlipidemia  Lab Results   Component Value Date    CHOL 215 (H) 09/11/2020    CHOL 238 (H) 05/29/2020    CHOL 250 (H) 03/10/2020     Lab Results   Component Value Date    HDL 47 09/11/2020    HDL 43 05/29/2020    HDL 43 03/10/2020     Lab Results   Component Value Date    LDL 139 (H) 09/11/2020    LDL 156 (H) 05/29/2020    LDL 180 (H) 03/10/2020     Lab Results   Component Value Date    TRIG 143 09/11/2020    TRIG 195 (H) 05/29/2020    TRIG 134 03/10/2020       On statins; well tolerated.  - Lipid panel    4. Obesity (BMI 30-39.9)  Recommend calorie restricted diet and exercise for weight loss.      5. Body mass index 34.0-34.9, adult    6. Long term insulin use      RTC in 3-4 months    Tanya Nones MD MPH  Endocrinologist, [facility]

## 2020-09-30 ENCOUNTER — Ambulatory Visit
Admission: RE | Admit: 2020-09-30 | Discharge: 2020-09-30 | Disposition: A | Discharge status: Home / Self Care | Payer: Medicare Other | Source: Ambulatory Visit | Attending: Family Medicine | Admitting: Family Medicine

## 2020-09-30 DIAGNOSIS — K760 Fatty (change of) liver, not elsewhere classified: Secondary | ICD-10-CM | POA: Insufficient documentation

## 2020-09-30 DIAGNOSIS — R1011 Right upper quadrant pain: Secondary | ICD-10-CM | POA: Insufficient documentation

## 2020-09-30 DIAGNOSIS — R140 Abdominal distension (gaseous): Secondary | ICD-10-CM | POA: Insufficient documentation

## 2020-10-02 ENCOUNTER — Encounter (INDEPENDENT_AMBULATORY_CARE_PROVIDER_SITE_OTHER): Payer: Self-pay | Admitting: Internal Medicine

## 2020-10-02 ENCOUNTER — Other Ambulatory Visit (INDEPENDENT_AMBULATORY_CARE_PROVIDER_SITE_OTHER): Payer: Self-pay

## 2020-10-02 DIAGNOSIS — I10 Essential (primary) hypertension: Secondary | ICD-10-CM

## 2020-10-02 MED ORDER — LOSARTAN POTASSIUM 50 MG PO TABS
50.0000 mg | ORAL_TABLET | Freq: Two times a day (BID) | ORAL | 0 refills | Status: DC
Start: 2020-10-02 — End: 2020-10-07

## 2020-10-07 ENCOUNTER — Other Ambulatory Visit (INDEPENDENT_AMBULATORY_CARE_PROVIDER_SITE_OTHER): Payer: Self-pay | Admitting: Cardiovascular Disease

## 2020-10-07 ENCOUNTER — Other Ambulatory Visit (INDEPENDENT_AMBULATORY_CARE_PROVIDER_SITE_OTHER): Payer: Self-pay | Admitting: Family Medicine

## 2020-10-07 DIAGNOSIS — E1165 Type 2 diabetes mellitus with hyperglycemia: Secondary | ICD-10-CM

## 2020-10-07 DIAGNOSIS — I10 Essential (primary) hypertension: Secondary | ICD-10-CM

## 2020-10-07 MED ORDER — GLIPIZIDE ER 10 MG PO TB24
10.0000 mg | ORAL_TABLET | Freq: Every day | ORAL | 0 refills | Status: DC
Start: 2020-10-07 — End: 2021-01-19

## 2020-10-07 MED ORDER — LOSARTAN POTASSIUM 50 MG PO TABS
50.0000 mg | ORAL_TABLET | Freq: Two times a day (BID) | ORAL | 0 refills | Status: DC
Start: 2020-10-07 — End: 2021-01-13

## 2020-10-07 NOTE — Telephone Encounter (Signed)
Last Encounter:  09/14/2020  Next Appointment: 12/14/2020    Last Primary Care Labs:   Lab Results   Component Value Date    WBC 7.99 09/11/2020    HGB 12.5 09/11/2020    HCT 40.2 09/11/2020    PLT 147 09/11/2020    CHOL 215 (H) 09/11/2020    TRIG 143 09/11/2020    HDL 47 09/11/2020    LDL 139 (H) 09/11/2020    ALT 15 09/11/2020    AST 16 09/11/2020    NA 140 09/11/2020    K 4.3 09/11/2020    CL 103 09/11/2020    CREAT 1.0 09/11/2020    BUN 19.0 09/11/2020    CO2 29 09/11/2020    TSH 2.40 09/11/2020    INR 1.0 05/10/2019    GLU 149 (H) 09/11/2020    HGBA1C 7.9 (H) 09/11/2020

## 2020-10-07 NOTE — Telephone Encounter (Signed)
Pt requesting losartan and glipizide as dropped in water. 30 day supply sent. Script was previously sent by specialist.

## 2020-10-07 NOTE — Telephone Encounter (Signed)
Name, strength, directions of requested refill(s):  losartan (COZAAR) 50 MG tablet  glipiZIDE (GLUCOTROL) 10 MG 24 hr tablet  (patient dropped medication bottle in water by accident)      Pharmacy to send refill to or patient to pick up rx from office (mark requested pharmacy in BOLD):      [id number] [place], [place] - [address] AT [facility] [address]  [address]  [place] [address]  Phone: [phone] Fax: [phone]    [email or web address] - [place], [place] - [address]  [address]  [place] [address]  Phone: [phone] Fax: [phone]        Please mark "X" next to the preferred call back number:    Mobile:   Telephone Information:   Mobile [phone]       Home: @HOMEPHONE @    Work: @WORKPHONE @          Next visit: 12/14/2020

## 2020-10-22 ENCOUNTER — Encounter (INDEPENDENT_AMBULATORY_CARE_PROVIDER_SITE_OTHER): Payer: Self-pay

## 2020-10-22 ENCOUNTER — Telehealth (INDEPENDENT_AMBULATORY_CARE_PROVIDER_SITE_OTHER): Payer: Self-pay | Admitting: Family Medicine

## 2020-10-22 NOTE — Telephone Encounter (Signed)
Referral Request    Patient is requesting a referral for physical therapy. Patient states she requested this referral at her last appointment with Dr. Delman Cheadle. Patient request a call back.     Best contact number:  [phone]

## 2020-10-22 NOTE — Telephone Encounter (Signed)
Called patient and informed her Dr. Delman Cheadle placed a PT order back in November. Printed off referral and will place at front desk for patient to pick up.

## 2020-10-24 ENCOUNTER — Encounter (INDEPENDENT_AMBULATORY_CARE_PROVIDER_SITE_OTHER): Payer: Self-pay

## 2020-10-25 ENCOUNTER — Encounter (INDEPENDENT_AMBULATORY_CARE_PROVIDER_SITE_OTHER): Payer: Self-pay

## 2020-10-26 ENCOUNTER — Telehealth: Payer: Self-pay

## 2020-10-26 ENCOUNTER — Other Ambulatory Visit (INDEPENDENT_AMBULATORY_CARE_PROVIDER_SITE_OTHER): Payer: Self-pay

## 2020-10-26 ENCOUNTER — Encounter: Payer: Self-pay | Admitting: Gastroenterology

## 2020-10-26 DIAGNOSIS — E1165 Type 2 diabetes mellitus with hyperglycemia: Secondary | ICD-10-CM

## 2020-10-26 NOTE — Telephone Encounter (Signed)
UV 02/04/2021  LV 09/29/2020

## 2020-10-26 NOTE — Pre-Procedure Instructions (Signed)
SPECIAL INSTRUCTIONS FOR THE PSS NAVIGATORS:  CONTACT ISOLATION:     TRANSLATOR REQUESTED:      NAME OF PERSON THAT WILL BE PRESENT TO HEAR DISCHARGE INSTRUCTIONS (please state how this person is related to the patient): HUSBAND JOSE HERNANDEZ    SPECIAL INSTRUCTIONS:    TRANSPORTATION: HUSBAND WILL COME IN WITH PT        TESTING - DAY OF SURGERY:  FSBS  PREGNANCY TEST WAIVERS:    ABNORMAL LABS:        SPECIAL CONSIDERATIONS-BLOOD PRODUCTS:    SPECIAL NEEDS FOR PATIENT  BEHAVIORAL /SENSORY:       PLEASE DO NOT REPLY TO THIS EMAIL. IF YOU HAVE ANY QUESTIONS, PLEASE CONTACT THE PREOP INTERVIEW OFFICE AT [phone] OR [phone]   MONDAY  FRIDAY.      Date of Procedure: 10/28/20    Arrival Time: 9:15 AM     Procedure Time: 10:45 AM      Someone from the hospital will call you after 4pm the business day prior to your scheduled procedure to verify the arrival and procedure times listed above. The purpose of this call is to advise you of any scheduling changes that may have occurred since the time of your telephone interview with the pre surgical services nurse.    NOTE FROM THE ANESTHESIA DEPARTMENT:  Please note that your procedure may be cancelled or rescheduled if you do not complete the required testing, preop clearances and/or follow the medication instructions that are required by your surgeon and /or the anesthesia department.    Requirements marked with an "X" below (i.e.: testing, etc.) Must be completed prior to your procedure :    ( X )  EGD :  PLEASE FOLLOW THE DIETARY GUIDELINES I PROVIDED BY YOUR SURGEON'S OFFICE.     Medication Instructions:    ASPIRIN 81 MG  STOP 10/26/20    PRAVASTATIN, LOSARTAN  TAKE PM BEFORE SURGERY AND TAKE ON REGULAR SCHEDULE UNTIL THEN.     LARAZEPAM  TAKE PM BEFORE SURGERY IF NEEDED     VERAPAMIL  TAKE  AM OF SURGERY AND ON REGULAR SCHEDULE UNTIL DAY OF SURGERY.    LANTUS INSULIN  TAKE AM OF SURGERY IF FSBS 170 OR GREATER    GLIPIZIDE, TRADJENTA  DO NOT TAKE AM OF SURGERY.  TAKE ON REGULAR SCHEDULE UNTIL THEN.     STOP HERBAL AND GREEN TEA NOW    DOES NOT TAKE IBUPROFEN, MOTRIN, ADVIL, ALEVE, NSAIDS .     YOU MAY TAKE TYLENOL AS NEEDED.    Hospital Address and Arrival Instructions:   [address], [place], [place] [address]  [facility] is where you will enter.  Please call [phone] morning of surgery if you need assistance upon arrival.    Upon arrival in the hospital main lobby via [employer], you will proceed to:  - Patient Registration, which is all the way down the hall on the left.  - Once registration is completed, you will be escorted by the registration staff to the Surgical Services Waiting Area, where you wait until a PreOp clinical team member escorts you to a room and initiates the PreOp process.  - You will need to have a valid photo I.D., insurance card and co-pay form of payment if required by insurance.    Pre Surgical General Instructions:  1. Bathe or shower the morning of the procedure with an anti-bacterial soap before arriving   2. Do not apply lotion,  perfume, cologne, or hair-care products such as hair spray or gels.  3. Do not shave your surgical site at home.  4. Do not wear makeup, jewelry including body piercing, watches, earrings, or rings.   5. You may brush your teeth and gargle on the morning of surgery but do not swallow any water.  6. Wear casual, loose fitting and comfortable clothing. A gown will be provided.  7. Plan to leave unnecessary valuables, credit cards (except for co-pay payment use) and jewelry at home or with a companion on day of surgery for safe keeping. The hospital is not responsible for lost/stolen items.  8. If you wear contacts please leave them at home. If you wear glasses, please bring a case.  9. Dentures - we will provide a container  10. Hearing aids, you may bring dos but you will be asked to remove them before surgery.  11. Please arrange for someone to drive you home. For your safety you will  not be allowed to drive home after sedation or anesthesia. A responsible adult must be present to accompany you home when you are ready to leave. We strongly recommend that all patients have an adult at home with them for the first 24 hours after surgery.  12. Notify your doctor if you develop any sign of illness before the date of your surgery. Report symptoms such as: high fever, sore throat, or other infection, breathing difficulties or chest pain.  13. Discontinue herbal supplements and herbal/green tea one week prior to surgery.    Below is a link/web address to the Preparing for Your Procedure video that walks you through the surgical experience.   [email or web address]    Possible Anesthesia Side Effects:   Nausea and vomiting. This common side effect usually occurs immediately after the procedure, but some people may continue to feel sick for a day or two. Anti-nausea medicines can help.   Dry mouth. You may feel parched when you wake up. As long as you're not too nauseated, sipping water can help take care of your dry mouth.   Sore throat or hoarseness. The tube put in your throat to help you breathe during surgery can leave you with a sore throat after it's removed.   Chills and shivering. It's common for your body temperature to drop during general anesthesia. Your doctors and nurses will make sure your temperature doesn't fall too much during surgery, but you may wake up shivering and feeling cold. Your chills may last for a few minutes to hours.   Confusion and fuzzy thinking. When first waking from anesthesia, you may feel confused, drowsy, and foggy. This usually lasts for just a few hours, but for some people  especially older adults  confusion can last for days or weeks.   Muscle aches. The drugs used to relax your muscles during surgery can cause soreness afterward.   Itching. If narcotic (opioid) medications are used during or after your operation, you may be itchy.  This is a common side effect of this class of drugs.   Bladder problems. You may have difficulty passing urine for a short time after general anesthesia.   Dizziness. You may feel dizzy when you first stand up. Drinking plenty of fluids should help you feel better.      Discharge Plan:  You will be ready to go home when the following criteria have been met:  - Minimal nausea,  - Tolerating oral liquids,  - Stable vital signs,  - Pain managed,  -  Questions have been answered,  - Patient / family have received written discharge instructions.    Recovery Plan:  1. Post Operative Bleeding:  a. Call the doctor if blood soaks through the bandage covering your wound or if blood is soaking through a sanitary pad hourly.  2. Reducing Nausea:  a. Take your pain medication with a little food.   b. Start with clear liquids and increase your diet as tolerated in small amounts.  3. Pain:   a. Take pain medication as prescribed.  b. Do not wait until pain is severe to take medication.  c. Call your doctor if pain is not relieved by the prescribed medication.  d. Take a stool softener to reduce constipation.   4. Infection Prevention:  a. Wash your hands prior to and after changing dressings as directed.  b. Keep dressings clean, dry and intact.  5. When To Call Your Doctor:  a. Fever greater than 100.4 F.   b. Bleeding or swelling that increases.  c. Increased pain not relieved by medication.  d. Unable to tolerate food or fluids.

## 2020-10-27 ENCOUNTER — Ambulatory Visit: Admission: RE | Admit: 2020-10-27 | Payer: Medicare Other | Source: Ambulatory Visit

## 2020-10-28 ENCOUNTER — Ambulatory Visit: Payer: Medicare Other | Admitting: Anesthesiology

## 2020-10-28 ENCOUNTER — Encounter: Payer: Self-pay | Admitting: Gastroenterology

## 2020-10-28 ENCOUNTER — Encounter
Admission: RE | Disposition: A | Discharge status: Home / Self Care | Payer: Self-pay | Source: Ambulatory Visit | Attending: Gastroenterology

## 2020-10-28 ENCOUNTER — Ambulatory Visit
Admission: RE | Admit: 2020-10-28 | Discharge: 2020-10-28 | Disposition: A | Discharge status: Home / Self Care | Payer: Medicare Other | Source: Ambulatory Visit | Attending: Gastroenterology | Admitting: Gastroenterology

## 2020-10-28 DIAGNOSIS — K2970 Gastritis, unspecified, without bleeding: Secondary | ICD-10-CM | POA: Insufficient documentation

## 2020-10-28 DIAGNOSIS — K2980 Duodenitis without bleeding: Secondary | ICD-10-CM | POA: Insufficient documentation

## 2020-10-28 DIAGNOSIS — R1084 Generalized abdominal pain: Secondary | ICD-10-CM | POA: Insufficient documentation

## 2020-10-28 DIAGNOSIS — K449 Diaphragmatic hernia without obstruction or gangrene: Secondary | ICD-10-CM | POA: Insufficient documentation

## 2020-10-28 DIAGNOSIS — K219 Gastro-esophageal reflux disease without esophagitis: Secondary | ICD-10-CM | POA: Insufficient documentation

## 2020-10-28 HISTORY — PX: EGD: SHX3789

## 2020-10-28 LAB — GLUCOSE WHOLE BLOOD - POCT: Whole Blood Glucose POCT: 141 mg/dL — ABNORMAL HIGH (ref 70–100)

## 2020-10-28 SURGERY — DONT USE, USE 1095-ESOPHAGOGASTRODUODENOSCOPY (EGD), DIAGNOSTIC
Anesthesia: Anesthesia General | Site: Esophagus | Wound class: Clean Contaminated

## 2020-10-28 MED ORDER — PROPOFOL 10 MG/ML IV EMUL (WRAP)
INTRAVENOUS | Status: AC
Start: 2020-10-28 — End: ?
  Filled 2020-10-28: qty 20

## 2020-10-28 MED ORDER — LIDOCAINE 1% BUFFERED - CNR/OUTSOURCED
0.3000 mL | Freq: Once | INTRAMUSCULAR | Status: AC
Start: 2020-10-28 — End: 2020-10-28
  Administered 2020-10-28: 0.3 mL via INTRADERMAL
  Filled 2020-10-28: qty 1

## 2020-10-28 MED ORDER — LIDOCAINE HCL 2 % IJ SOLN
INTRAMUSCULAR | Status: DC | PRN
Start: 2020-10-28 — End: 2020-10-28
  Administered 2020-10-28: 100 mg

## 2020-10-28 MED ORDER — GLYCOPYRROLATE 0.2 MG/ML IJ SOLN (WRAP)
INTRAMUSCULAR | Status: DC | PRN
Start: 2020-10-28 — End: 2020-10-28
  Administered 2020-10-28: .2 mg via INTRAVENOUS

## 2020-10-28 MED ORDER — LACTATED RINGERS IV SOLN
INTRAVENOUS | Status: DC
Start: 2020-10-28 — End: 2020-10-28
  Administered 2020-10-28: 1000 mL via INTRAVENOUS

## 2020-10-28 MED ORDER — GLYCOPYRROLATE 0.2 MG/ML IJ SOLN (WRAP)
INTRAMUSCULAR | Status: AC
Start: 2020-10-28 — End: ?
  Filled 2020-10-28: qty 1

## 2020-10-28 MED ORDER — PROPOFOL INFUSION 10 MG/ML
INTRAVENOUS | Status: DC | PRN
Start: 2020-10-28 — End: 2020-10-28
  Administered 2020-10-28: 30 mg via INTRAVENOUS
  Administered 2020-10-28: 20 mg via INTRAVENOUS
  Administered 2020-10-28: 30 mg via INTRAVENOUS
  Administered 2020-10-28: 20 mg via INTRAVENOUS
  Administered 2020-10-28: 80 mg via INTRAVENOUS

## 2020-10-28 MED ORDER — LIDOCAINE HCL (PF) 2 % IJ SOLN
INTRAMUSCULAR | Status: AC
Start: 2020-10-28 — End: ?
  Filled 2020-10-28: qty 5

## 2020-10-28 SURGICAL SUPPLY — 123 items
AMPOULE ELEVIEW 10ML (Endoscopic Supplies)
BALLOON CRE DILTR 12-15MMX8CM (Balloons)
BASIN EME PLS 700ML LF GRAD TRNLU PGMNT (Patient Supply)
BASIN EMESIS 700 ML GRADUATED TRANSLUCENT PIGMENT FREE PLASTIC (Patient Supply) IMPLANT
BASIN EMESIS LF (Patient Supply)
BLOCK BITE OD20 MM POLYETHYLENE ADULT (Patient Supply) ×1
BLOCK BITE OD20 MM POLYETHYLENE ADULT MOUTHPIECE STRAP RETENTION RIM (Patient Supply) ×1 IMPLANT
BLOCK SCOPE SAVER BITE ADULT (Patient Supply) ×1
BRUSH ENDOSCOPIC CLEANING SLIM 2 BUNDLE (Endoscopic Supplies) ×1
BRUSH ENDOSCOPIC CLEANING SLIM 2 BUNDLE CATHETER ROBUST VALVE OD5 MM (Endoscopic Supplies) ×1 IMPLANT
BRUSH HEDGEHOG ENDO DBL END (Endoscopic Supplies) ×1
CATH BLN DIL 5.5FRX240CMX6-8MM (Balloons)
CATH CRE 6F 10-12 8X180CM (Balloons)
CATH CRE 6F 18-20 8X180CM (Balloons)
CATH CRE 7.5F 18-20 5.5X240CM (Balloons)
CATH GOLD PROBE HEMO 7F 300CM (Procedure Accessories)
CATHETER BALLOON DILATATION CRE 2.8 MM (Balloons)
CATHETER BALLOON DILATATION CRE PEBAX (Balloons)
CATHETER BALLOON DILATATION L5.5 CM L240 (Balloons)
CATHETER INJCTN GOLD PROBE 7FR (Catheter Micellaneous)
CATHETER OD10-11-12 MM ODSEC6 FR L180 CM CRE BALLOON DILATATION L8 CM (Balloons) IMPLANT
CATHETER OD10-11-12 MM ODSEC6 FR L180 CM CREâ„¢ BALLOON DILATATION L8 CM (Balloons) IMPLANT
CATHETER OD18-19-20 MM ODSEC6 FR L180 CM CRE BALLOON DILATATION L8 CM (Balloons) IMPLANT
CATHETER OD18-19-20 MM ODSEC6 FR L180 CM CREâ„¢ BALLOON DILATATION L8 CM (Balloons) IMPLANT
CATHETER OD6 FR ODSEC12-13.5-15 MM L180 CM CRE BALLOON DILATATION L8 (Balloons) IMPLANT
CATHETER OD6 FR ODSEC12-13.5-15 MM L180 CM CREâ„¢ BALLOON DILATATION L8 (Balloons) IMPLANT
CATHETER OD6-7-8 MM ODSEC6 FR L180 CM CRE BALLOON DILATATION L8 CM (Balloons) IMPLANT
CATHETER OD6-7-8 MM ODSEC6 FR L180 CM CREâ„¢ BALLOON DILATATION L8 CM (Balloons) IMPLANT
CATHETER OD7 FR L300 CM BIPOLAR ROUND (Procedure Accessories)
CATHETER OD7 FR L300 CM BIPOLAR ROUND DISTAL TIP STANDARD CONNECTOR (Procedure Accessories) IMPLANT
CATHETER OD7 FR ODSEC25 GA ID.24 MM L210 (Catheter Micellaneous)
CATHETER OD7 FR ODSEC25 GA ID.24 MM L210 CM BIPOLAR ROUND DISTAL TIP (Catheter Miscellaneous) IMPLANT
CATHETER OD7.5 FR ODSEC12-15 MM L240 CM CRE BALLOON DILATATION L5.5 (Balloons) IMPLANT
CATHETER OD7.5 FR ODSEC12-15 MM L240 CM CREâ„¢ BALLOON DILATATION L5.5 (Balloons) IMPLANT
CATHETER OD7.5 FR ODSEC15-18 MM L240 CM CRE BALLOON DILATATION L5.5 (Balloons) IMPLANT
CATHETER OD7.5 FR ODSEC15-18 MM L240 CM CREâ„¢ BALLOON DILATATION L5.5 (Balloons) IMPLANT
CATHETER OD8-9-10 MM ODSEC6 FR L180 CM CRE BALLOON DILATATION L8 CM (Balloons) IMPLANT
CATHETER OD8-9-10 MM ODSEC6 FR L180 CM CREâ„¢ BALLOON DILATATION L8 CM (Balloons) IMPLANT
CATHETER OD8-9-10 MM ODSEC7.5 FR L240 CM CRE BALLOON DILATATION L5.5 (Balloons) IMPLANT
CATHETER OD8-9-10 MM ODSEC7.5 FR L240 CM CREâ„¢ BALLOON DILATATION L5.5 (Balloons) IMPLANT
CONTAINER HISTOLOGY 60 ML 30 ML GRADUATE LEAK RESISTANT O RING PREFILL (Procedure Accessories) IMPLANT
DEVICE CRE STERIFLATE INFLTN (Endoscopic Supplies)
DEVICE INFLATION CRE (Endoscopic Supplies)
DEVICE INFLATION CRE STERI-FLATE (Endoscopic Supplies) IMPLANT
DILATOR ENDOSCOPIC CRE 2.8 MM 3.2 MM (Balloons)
DILATOR ENDOSCOPIC CRE 2.8 MM 3.2 MM PEBAX ESOPHAGEAL PYLORIC COLONIC (Balloons) IMPLANT
DILATOR ENDOSCOPIC CRE 5.5C 240CM 18-19-20MM 7.5FR PEBAX ESOPHAGEAL (Balloons) IMPLANT
DILATOR ENDOSCOPIC CRE 5.5C 240CM 6-7-8MM 7.5FR PEBAX ESOPHAGEAL (Balloons) IMPLANT
DILATOR ENDOSCOPIC CRE PEBAX ESOPHAGEAL (Balloons)
DILATOR WREGDE  BLLN 10-12MM (Balloons)
DILATOR WREGDE  BLLN 12-15MM (Balloons)
DILATOR WREGDE  BLLN 15-18MM (Balloons)
DILATOR WREGDE  BLLN 6-8MM (Balloons)
DILATOR WREGDE  BLLN 8-10MM (Balloons)
ELECTRODE ADULT PATIENT RETURN L9 FT REM POLYHESIVE ACRYLIC FOAM (Procedure Accessories) IMPLANT
ELECTRODE PATIENT RETURN L9 FT VALLEYLAB (Procedure Accessories)
FORCEP HOT BIOPSY RJ4 (Endoscopic Supplies)
FORCEPS BIOPSY L240 CM +2.8 MM HOT OD2.2 (Endoscopic Supplies)
FORCEPS BIOPSY L240 CM +2.8 MM HOT OD2.2 MM RADIAL JAW (Endoscopic Supplies) IMPLANT
FORCEPS BIOPSY L240 CM JUMBO MICROMESH (Instrument)
FORCEPS BIOPSY L240 CM JUMBO MICROMESH TEETH STREAMLINE CATHETER (Instrument) IMPLANT
FORCEPS BIOPSY L240 CM LARGE CAPACITY (Instrument)
FORCEPS BIOPSY L240 CM MICROMESH TEETH STREAMLINE CATHETER NEEDLE (Instrument) IMPLANT
FORCEPS BIOPSY L240 CM STANDARD CAPACITY (Instrument) ×1
FORCEPS JAW RADIAL JUMBO (Instrument)
FORCEPS RAD JAW 4 BIOSPY W/NDL (Instrument)
FORCEPS RADIAL JAW 4 2.8MM (Instrument) ×1
GLOVE SURG STRL UNIVERSAL SZ 8 (Glove) ×1
GLOVE SURGICAL 8 BIOGEL SKINSENSE (Glove) ×1
GLOVE SURGICAL 8 BIOGEL SKINSENSE INDICATOR UNDERGLOVE POWDER FREE (Glove) ×1 IMPLANT
GOWN ISOLATION XL HOOK LOOP NECK KNIT (Patient Supply) ×2 IMPLANT
GOWN XL - NON STERILE (Patient Supply) ×2
KIT ENDO W/ FOUR PACK BUTTONS (Kits) ×1
KIT ENDOSCOPIC COMPLIANCE ENDOKIT (Kits) ×1
KIT ENDOSCOPIC COMPLIANCE ENDOKIT ORCAPOD 4 1.1 OZ (Kits) ×1 IMPLANT
LIFTER SRG 10ML ELEVIEW 5X5.1X1.2IN INJ (Endoscopic Supplies) IMPLANT
MANIFOLD NEPTUNE II 4PORT SUCT (Filter) ×1
MANIFOLD SUCTION 2 STANDARD 4 PORT (Filter) ×1
MANIFOLD SUCTION 2 STANDARD 4 PORT NEPTUNE 2 WASTE MANAGEMENT SYSTEM (Filter) ×1 IMPLANT
MARKER ENDOSCOPIC PERMANENT INDICATION (Syringes, Needles)
MARKER ENDOSCOPIC PERMANENT INDICATION DARK SYRINGE SPOT EX 5 ML (Syringes, Needles) IMPLANT
NEEDLE CARR-LOCKE INJECT 25GX5 (Needles)
NEEDLE SCLEROTHERAPY CARR-LOCKE OD25 GA ODSEC2.5 MM L230 CM INJECTION (Needles) IMPLANT
NEEDLE SCLEROTHERAPY OD25 GA ODSEC2.5 MM (Needles)
NET SPECIMEN RETRIEVAL L230 CM STANDARD (Urology Supply)
NET SPECIMEN RETRIEVAL L230 CM STANDARD SHEATH OD2.5 MM L6 CM X W3 CM (Urology Supply) IMPLANT
PAD CINCHPAD ENDO TRANSPORT (Procedure Accessories)
PAD ELECTROSRG GRND REM W CRD (Procedure Accessories)
PAD TRANSPORT L42 IN X W17 IN (Procedure Accessories)
PAD TRANSPORT L42 IN X W17 IN TRANSLUCENT LEAK PROOF ABSORBENT (Procedure Accessories) IMPLANT
PROBE COAGULATION L7.2 FT (Endoscopic Supplies)
PROBE COAGULATION L7.2 FT CIRCUMFERENTIAL PLUG PLAY FUNCTIONALITY (Endoscopic Supplies) IMPLANT
PROBE ELECTROSURGICAL L220 CM FLEXIBLE (Procedure Accessories)
PROBE ELECTROSURGICAL L220 CM FLEXIBLE STRAIGHT FIRE OD2.3 MM FIAPC (Procedure Accessories) IMPLANT
PROBE FIAPC CIRC 220MM (Endoscopic Supplies)
PROBE FIAPC STRGHT FIRE 220CM (Procedure Accessories)
RETRIEVER ROTH NET STD 2.5MM (Urology Supply)
SNARE 2.8 CM ROUND L240 CM OD10 MM (Endoscopic Supplies)
SNARE 2.8 CM ROUND L240 CM OD10 MM ODSEC2.4 MM CAPTIVATOR LOOP STIFF (Endoscopic Supplies) IMPLANT
SNARE 9 MM L230 CM OD2.4 MM COLD BRAID (Instrument)
SNARE 9 MM L230 CM OD2.4 MM EXACTO COLD BRAID WIRE CLEAN CUT (Instrument) IMPLANT
SNARE CAPTIFLEX 27MM (Endoscopic Supplies)
SNARE CAPTIVATOR 13MMX240CM (GE Lab Supplies)
SNARE CAPTVTR II RND STFF 10MM (Endoscopic Supplies)
SNARE ESCP MIC CPTVTR 13MM 240IN STRL (GE Lab Supplies)
SNARE EXACTO COLD 2.4MMX230CM (Instrument)
SNARE MD OVAL 240CM 2.4 MM CPTFLX LP FLXBL ENDOSCOPIC POLYPECTOMY 27MM (Endoscopic Supplies) IMPLANT
SNARE MEDIUM OVAL L240 CM OD2.4 MM (Endoscopic Supplies)
SNARE SMALL HEXAGON CAPTIVATOR STIFF ENDOSCOPIC POLYPECTOMY (GE Lab Supplies) IMPLANT
SOL FORMALIN 10% PREFILL 30ML (Procedure Accessories) ×10
SPONGE GAUZE L4 IN X W4 IN 4 PLY HIGH (Sponge)
SPONGE GAUZE L4 IN X W4 IN 4 PLY NONWOVEN LINT FREE CURITY RAYON (Sponge) IMPLANT
SPONGE GAUZE VERSA 4PLY 4X4 (Sponge)
SYRING SPOT EX ENDO MARK 5CC (Syringes, Needles)
SYRINGE 50 ML GRADUATE NONPYROGENIC DEHP (Syringes, Needles)
SYRINGE 50 ML GRADUATE NONPYROGENIC DEHP FREE PVC FREE BD MEDICAL (Syringes, Needles) IMPLANT
SYRINGE SLIP-TIP 60CC (Syringes, Needles)
TRAP MUCUS SCREW CAP TUBE ID LABEL (Procedure Accessories)
TRAP MUCUS SCREW CAP TUBE ID LABEL MEDLINE PLASTIC CLEAR (Procedure Accessories) IMPLANT
TRAP MUCUS SPEC 40CC (Procedure Accessories)
TUBING CONNECTING STERILE 10FT (Tubing) ×1
TUBING SUCTION ID3/16 IN L10 FT (Tubing) ×1
TUBING SUCTION ID3/16 IN L10 FT NONCONDUCTIVE STRAIGHT MALE FEMALE (Tubing) ×1 IMPLANT

## 2020-10-28 NOTE — PACU (Signed)
Attempted to call friend, no answer at this time.

## 2020-10-28 NOTE — Anesthesia Postprocedure Evaluation (Signed)
Anesthesia Post Evaluation    Patient: Emily Rowe    Procedure(s):  EGD w/ bx's    Anesthesia type: No value filed.    Last Vitals:   Vitals Value Taken Time   BP 179/86 10/28/20 1130   Temp 36.9 C (98.4 F) 10/28/20 1045   Pulse 75 10/28/20 1130   Resp 16 10/28/20 1120   SpO2 97 % 10/28/20 1130                 Anesthesia Post Evaluation:     Patient Evaluated: floor  Patient Participation: complete - patient participated  Level of Consciousness: awake and alert  Pain Score: 0  Pain Management: adequate    Airway Patency: patent    Anesthetic complications: No      PONV Status: none    Cardiovascular status: acceptable  Respiratory status: acceptable  Hydration status: acceptable        Signed by: Kym Groom, MD, 10/28/2020 12:18 PM

## 2020-10-28 NOTE — Transfer of Care (Signed)
Anesthesia Transfer of Care Note    Patient: Emily Rowe    Procedures performed: Procedure(s):  EGD w/ bx's    Anesthesia type: General TIVA    Patient location:Phase II PACU    Last vitals:   Vitals:    10/28/20 1045   BP: 174/85   Pulse: 87   Resp: 16   Temp: 36.9 C (98.4 F)   SpO2: 100%       Post pain: Patient not complaining of pain, continue current therapy      Mental Status:awake    Respiratory Function: tolerating face mask    Cardiovascular: stable    Nausea/Vomiting: patient not complaining of nausea or vomiting    Hydration Status: adequate    Post assessment: no apparent anesthetic complications    Signed by: Jenelle Mages, CRNA  10/28/20 10:47 AM

## 2020-10-28 NOTE — Discharge Instr - AVS First Page (Addendum)
Upper Endoscopy (EGD) Findings: gastritis, duodenitis, and hiatal hernia      1. After the procedure you may experience a mild sore throat. This should resolve in 1-2 days. You may use lozenges or gargle with salt water.  2. After the procedure you may experience some intestinal gas, bloating, or cramping. If you experience any bleeding, abdominal pain, persistent nausea or vomiting, chest pain, difficulty breathing or swallowing, contact your physician or go th the emergency room immediately. There may be some inflammation at the site where intravenous medication was given, if this occurs you may apply a warm towel; call your physician if the condition worsens.   3. You should begin with a clear liquid diet after the procedure. Advance your diet as tolerated. Most likely you can eat a regular diet by tonight, unless otherwise instructed by your physician.  4. You should be able to resume normal activity (work, house chores, etc.) by tomorrow, unless otherwise instructed by your physician. DO NOT make important decisions, sign any important document or drive for at least 24 hours.  5. You should resume all previous medications unless otherwise instructed. If biopsies were taken and/or polyp(s) removed do not take Aspirin, Ibuprofen, Aleve, Naproxen, blood thinners, Plavix or coumadin for   Days  You may take Tylenol (Acetaminophen)  6. Please follow-up at the office as scheduled. Please call the office for an appointment if not already scheduled: [facility]. [address], [place]. [address]    Office [phone], fax : [phone]  7. Log on our patient portal for your biopsy or lab results: [email or web address]  Patient Portal tab. You will get your results faster this way.   8. Your results will be conveyed to you at your follow-up appointment with the doctor who performed your procedure.     Philbert Riser, MD, MD

## 2020-10-28 NOTE — Anesthesia Preprocedure Evaluation (Signed)
Anesthesia Evaluation    AIRWAY    Mallampati: II    TM distance: >3 FB  Neck ROM: full  Mouth Opening:full   CARDIOVASCULAR    cardiovascular exam normal       DENTAL         PULMONARY    pulmonary exam normal     OTHER FINDINGS                  Relevant Problems   NEURO/PSYCH   (+) History of hemorrhagic stroke with residual hemiparesis   (+) Stroke      CARDIO   (+) Essential hypertension      ENDO   (+) Type 2 diabetes mellitus treated with insulin               Anesthesia Plan    ASA 2     general                                 informed consent obtained                   Signed by: Kym Groom, MD 10/28/20 7:30 AM

## 2020-10-29 ENCOUNTER — Encounter: Payer: Self-pay | Admitting: Gastroenterology

## 2020-10-29 LAB — LAB USE ONLY - HISTORICAL SURGICAL PATHOLOGY

## 2020-10-30 ENCOUNTER — Ambulatory Visit (INDEPENDENT_AMBULATORY_CARE_PROVIDER_SITE_OTHER): Payer: Medicare Other | Admitting: Cardiovascular Disease

## 2020-10-30 ENCOUNTER — Encounter (INDEPENDENT_AMBULATORY_CARE_PROVIDER_SITE_OTHER): Payer: Self-pay | Admitting: Cardiovascular Disease

## 2020-10-30 ENCOUNTER — Telehealth (INDEPENDENT_AMBULATORY_CARE_PROVIDER_SITE_OTHER): Payer: Self-pay | Admitting: Family Medicine

## 2020-10-30 VITALS — BP 127/78 | HR 76 | Ht 65.0 in | Wt 205.0 lb

## 2020-10-30 DIAGNOSIS — I639 Cerebral infarction, unspecified: Secondary | ICD-10-CM

## 2020-10-30 DIAGNOSIS — E1142 Type 2 diabetes mellitus with diabetic polyneuropathy: Secondary | ICD-10-CM

## 2020-10-30 DIAGNOSIS — E782 Mixed hyperlipidemia: Secondary | ICD-10-CM

## 2020-10-30 DIAGNOSIS — I10 Essential (primary) hypertension: Secondary | ICD-10-CM

## 2020-10-30 MED ORDER — LANTUS SOLOSTAR 100 UNIT/ML SC SOPN
PEN_INJECTOR | SUBCUTANEOUS | 1 refills | Status: DC
Start: 2020-10-30 — End: 2020-11-17

## 2020-10-30 MED ORDER — ALIROCUMAB 75 MG/ML SC SOAJ
75.0000 mg | SUBCUTANEOUS | 3 refills | Status: DC
Start: 2020-10-30 — End: 2020-11-19

## 2020-10-30 NOTE — Patient Instructions (Signed)
1. Request records of coronary artery calcium score from Dr. Delman Cheadle. May need to repeat.  2. Echocardiogram  3. Will get PCSK9 inhibitor covered if possible    ++++++++++++++++++++++++++++++++++++  ++++++++++++++++++++++++++++++++++++

## 2020-10-30 NOTE — Progress Notes (Signed)
[facility] OFF[facility] PROGRESS NOTE    [facility] OFFICE -CARDIOLOGY  [phone]  [address]  [place] [address]  Dept: [phone]  Dept Fax: [phone]       Patient Name: Emily Rowe, Emily Rowe    Date of Visit:  October 30, 2020  Date of Birth: August 20, 1951  AGE: 70 y.o.  Medical Record #: [id number]  Requesting Physician: Rebecca Eaton, MD      CHIEF COMPLAINT: Hypertension and Facial Swelling      HISTORY OF PRESENT ILLNESS:    She is a pleasant 70 y.o. female has a history of HTN, HLD, prior CVA in 2012 and diabetes.      Overall patient says she is not doing very well.  She is under a lot of psychosocial stress.  Recently her brother passed away.  She is suffering from anxiety and panic attacks.  She suffers from neuropathy.  Because of pain and anxiety, she will sometimes have very elevated blood pressures prompting 911 calls.  She also suffers from chronic GI pain.  Recently underwent work-up which per the patient's report seems to have revealed H. pylori and gastritis.  She is now undergoing treatment for this.  She suffers from chronic dizziness and dyspnea.      PAST MEDICAL HISTORY: She has a past medical history of Anxiety, Asthma, Bowel perforation, Depression, Diabetes, Difficulty walking, Dysphagia, Gastroesophageal reflux disease, Hyperlipemia, Hypertensive disorder, Hypokalemia, Neuropathy, Post-operative nausea and vomiting, Sleep apnea (2017), Stroke (2012), TIA (transient ischemic attack), and Type 2 diabetes mellitus, controlled (2021). She has a past surgical history that includes Hysterectomy; Tonsillectomy; Reduction mammaplasty; urinary bladder; Colonoscopy; Colonoscopy (N/A, 09/17/2020); and EGD (N/A, 10/28/2020).    ALLERGIES:   Allergies   Allergen Reactions    Codeine Nausea And Vomiting    Crestor [Rosuvastatin] Rash     D/c due to severe rash and higher blood sugars    Glucosamine Hives and Nausea And Vomiting     Latex Rash    Tree Nuts Hives     ESPECIALLY ALMONDS     Eggs Or Egg-Derived Products      Patient had an allergy test and stated that she was allergic to eggs    Gabapentin      Dizziness and rash, irregular heart rate    Invokana [Canagliflozin]     Levemir [Insulin Detemir] Nausea And Vomiting    Lyrica [Pregabalin]      PT STATES IT MAKES HER NEUROPATHY WORSE STATES SHE FEELS LIKE SHE IS ON FIRE    Metformin      diarrhea    Evaristo Bury [Insulin Degludec]     Victoza [Liraglutide] Nausea And Vomiting    Flaxseed [Linseed Oil] Rash    Omega 3 [Fish Oil] Rash    Peanut-Containing Drug Products Rash       MEDICATIONS:   Current Outpatient Medications   Medication Sig    aspirin 81 MG chewable tablet Chew 81 mg by mouth daily TAKES SOME DAYS      glipiZIDE (GLUCOTROL) 10 MG 24 hr tablet Take 1 tablet (10 mg total) by mouth daily    glucose blood test strip Check sugar three times a day, ICD 10 E11.9    insulin glargine (LANTUS SOLOSTAR) 100 UNIT/ML injection pen Inject 10 units into skin daily at bedtime ONLY AS NEEDED IF BLOOD SUGAR >OR = TO 170  LAST TAKEN 07/2020    LORazepam (ATIVAN) 0.5 MG tablet Take  0.5 tablets (0.25 mg total) by mouth nightly as needed for Anxiety (Patient taking differently: Take 0.5 mg by mouth nightly as needed for Anxiety   )    losartan (COZAAR) 50 MG tablet Take 1 tablet (50 mg total) by mouth 2 (two) times daily (Patient taking differently: Take 100 mg by mouth every evening   )    pravastatin (PRAVACHOL) 20 MG tablet Take 1 tablet (20 mg total) by mouth daily    verapamil ER (CALAN-SR) 120 MG CR tablet Take 1 tablet (120 mg total) by mouth every morning    alirocumab (PRALUENT) 75 MG/ML Solution Auto-injector subcutaneous auto-injector Inject 1 mL (75 mg total) into the skin every 14 (fourteen) days        FAMILY HISTORY: family history includes Breast cancer in her daughter; Diabetes in her maternal grandmother and mother; Heart attack in her mother; Stroke in her  maternal grandfather and maternal grandmother.    SOCIAL HISTORY: She reports that she has never smoked. She has never used smokeless tobacco.    PHYSICAL EXAMINATION    Visit Vitals  BP 127/78 (BP Site: Left arm, Patient Position: Sitting)   Pulse 76   Ht 1.651 m (5\' 5" )   Wt 93 kg (205 lb)   BMI 34.11 kg/m       General Appearance:  An anxious-appearing female in no acute distress.    Skin: Warm and dry to touch, no apparent skin lesions, or masses noted.  Head: Normocephalic, normal hair pattern, no masses or tenderness   Eyes: EOMS Intact, PERRL, conjunctivae and lids unremarkable.  ENT: Ears, Nose and throat reveal no gross abnormalities.  No pallor or cyanosis.  Dentition good.   Neck: JVP normal, no carotid bruit, thyroid not enlarged   Chest: Clear to auscultation bilaterally with good air movement and respiratory effort and no wheezes, rales, or rhonchi   Cardiovascular: Regular rhythm, S1 normal, S2 normal, No S3 or S4, Apical impulse not displaced. No murmurs. No gallops or rubs detected   Abdomen: Soft, nontender, nondistended, with normoactive bowel sounds. No organomegaly.  No pulsatile masses, or bruits.   Extremities: Warm without edema. No clubbing, or cyanosis. All peripheral pulses are full and equal.   Neuro: Alert and oriented x3.       LABS:   No results found for: CBC  Lab Results   Component Value Date    AST 16 09/11/2020    ALT 15 09/11/2020     No results found for: LIPID  Lab Results   Component Value Date    HGBA1C 7.9 (H) 09/11/2020    BNP 24 06/05/2018    TSH 2.40 09/11/2020           IMPRESSION:   1. Hypertension, currently controlled.    a.  Intolerances w/ toprol XL, lisinopril, and chlorthalidone.  2. Previous palpitations with unremarkable Body Guardian monitor 05/2017  3. Hyperlipidemia -intolerant to several statins.  LDL 139  4. Obesity.   5. Structurally normal heart by echocardiogram in 2016.   6. Insulin-dependent diabetes  7. CVA in February 2012.  8. Patient reports she  had sleep study in past and was not recommended for CPAP.  9. Psychosocial stressors, panic attacks, anxiety  10. Chronic dyspnea    RECOMMENDATIONS:    -  Continue current antihypertensives  -  Patient's cholesterol is not well controlled on maximally tolerated statin therapy  - Given her family history of coronary artery disease /cerebrovascular disease, personal history of  stroke, and diabetes, she would benefit from intensive lipid therapy.  We will prescribe PCSK9 inhibitor  - Transthoracic echocardiogram given dyspnea  - Requesting previous coronary artery calcium score from primary care physician                                                 Orders Placed This Encounter   Procedures    Office Visit ([facility])       Orders Placed This Encounter   Medications    alirocumab (PRALUENT) 75 MG/ML Solution Auto-injector subcutaneous auto-injector     Sig: Inject 1 mL (75 mg total) into the skin every 14 (fourteen) days     Dispense:  6 pen     Refill:  3       SIGNED:    Belinda Fisher, MD          This note was generated by the Dragon speech recognition and may contain errors or omissions not intended by the user. Grammatical errors, random word insertions, deletions, pronoun errors, and incomplete sentences are occasional consequences of this technology due to software limitations. Not all errors are caught or corrected. If there are questions or concerns about the content of this note or information contained within the body of this dictation, they should be addressed directly with the author for clarification.

## 2020-10-30 NOTE — Telephone Encounter (Signed)
Ciarra from [facility] called requesting a:    CTA Calcium Scoring order to be faxed to their location. Darrel Reach states that patient had an appointment with them today that her primary care physician, Dr. Delman Cheadle had ordered this for the patient. Writer could not find order in chart.    Patient's LOV 09/14/20    Patient has upcoming appt. scheduled for  12/14/20 with Dr. Delman Cheadle.    Darrel Reach can be reached at  phone number: [phone]   Fax number: [phone]    Please advise and thank you!

## 2020-10-30 NOTE — Telephone Encounter (Signed)
Please see message. Thanks!

## 2020-11-03 ENCOUNTER — Ambulatory Visit: Payer: Medicare Other

## 2020-11-04 ENCOUNTER — Other Ambulatory Visit (INDEPENDENT_AMBULATORY_CARE_PROVIDER_SITE_OTHER): Payer: Self-pay

## 2020-11-04 ENCOUNTER — Other Ambulatory Visit (INDEPENDENT_AMBULATORY_CARE_PROVIDER_SITE_OTHER): Payer: Self-pay | Admitting: Family Medicine

## 2020-11-04 ENCOUNTER — Encounter (INDEPENDENT_AMBULATORY_CARE_PROVIDER_SITE_OTHER): Payer: Self-pay

## 2020-11-04 ENCOUNTER — Ambulatory Visit
Admission: RE | Admit: 2020-11-04 | Discharge: 2020-11-04 | Disposition: A | Discharge status: Home / Self Care | Payer: Self-pay | Source: Ambulatory Visit

## 2020-11-04 ENCOUNTER — Other Ambulatory Visit: Payer: Self-pay

## 2020-11-04 ENCOUNTER — Ambulatory Visit
Admission: RE | Admit: 2020-11-04 | Discharge: 2020-11-04 | Disposition: A | Discharge status: Home / Self Care | Payer: Medicare Other | Source: Ambulatory Visit | Attending: Family Medicine | Admitting: Family Medicine

## 2020-11-04 DIAGNOSIS — Z1231 Encounter for screening mammogram for malignant neoplasm of breast: Secondary | ICD-10-CM

## 2020-11-04 DIAGNOSIS — Z006 Encounter for examination for normal comparison and control in clinical research program: Secondary | ICD-10-CM

## 2020-11-04 MED ORDER — EVOLOCUMAB 140 MG/ML SC SOAJ
140.0000 mg | SUBCUTANEOUS | 2 refills | Status: DC
Start: 2020-11-04 — End: 2021-01-05

## 2020-11-04 NOTE — Progress Notes (Signed)
Reached out to pt to let her know that Praluent is not covered under her insurance. I have confirmed with Dr. Dimple Casey that we can try to see if Repatha is. Prescription has been sent to pts pharmacy.

## 2020-11-04 NOTE — H&P (Signed)
GASTROENTEROLOGY PREOP H&P    Date Time: 11/04/20 3:13 PM  Patient Name: Emily Rowe  Attending Physician: No att. 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  She has seen her gastroenterologist Dr. Zipporah Rowe a six-month follow-up MRI with contrast      COVID-19 virus infection 06/28/2019    Essential hypertension 05/11/2019    Diabetic peripheral neuropathy 07/12/2018    History of hemorrhagic stroke with residual hemiparesis 07/12/2018    Type 2 diabetes mellitus treated with insulin 07/12/2018    Neuropathy 01/17/2018    Obesity (BMI 30-39.9) 02/08/2017    Uncontrolled type 2 diabetes mellitus with hyperglycemia 04/20/2016    Mixed hyperlipidemia 04/20/2016    Pure hypercholesterolemia, unspecified 11/20/2013    Stroke 10/17/2010     History of Present Illness:   Emily Rowe is a 70 y.o. female.    Past Medical History:     Past Medical History:   Diagnosis Date    Anxiety     Asthma     allergic reaction to mold, not since 20 years, HAS WHEEZING IN COLD WEATHER. NO INHALERS    Bowel perforation     COLONOSCOPY 09/2019    Depression     Diabetes     Difficulty walking     WEAK RIGHT LEG AFTER CVA 2021    Dysphagia     BIG PILLS ONLY     Gastroesophageal reflux disease     PREVACID OTC PRN     Hyperlipemia     Hypertensive disorder     Hypokalemia     CMP REQ PCP 09/14/20    Neuropathy     NUMBNESS RIGHT UE AND LE     Post-operative nausea and vomiting     ALWAYS WITH ANESTHESIA     Sleep apnea 2017    NO CPAP NEEDED PER PT     Stroke 2012     ARM, LEG WEAK. WALKS WITH CANE, WALKER    TIA (transient ischemic attack)      MULTIPLE BEFORE CVA 2012    Type 2 diabetes mellitus, controlled 2021    MANAGED BY DR Binnie Rail RAO. AM FSB 117-140. TAKES LANTUS PRN IN AM. HAIC 7.9     Past Surgical History:     Past Surgical History:   Procedure Laterality Date    BREAST BIOPSY Left     benign lump biopsy    COLONOSCOPY      X 2    COLONOSCOPY N/A 09/17/2020    Procedure: COLONOSCOPY;  Surgeon: Edrick Oh, MD;  Location: [facility];  Service: Gastroenterology;  Laterality: N/A;    EGD N/A 10/28/2020    Procedure: EGD w/ bx's;  Surgeon: Philbert Riser, MD;  Location: [facility];  Service: Gastroenterology;  Laterality: N/A;    HYSTERECTOMY      2005    OOPHORECTOMY Bilateral     2005    REDUCTION MAMMAPLASTY      TONSILLECTOMY      urinary bladder      BLADDER  LIFT      Family History:     Family History   Problem Relation Age of Onset    Diabetes Mother     Heart attack Mother     Diabetes Maternal Grandmother     Stroke Maternal Grandmother     Breast cancer Daughter 30    Stroke Maternal Grandfather      Social History:     Social History     Socioeconomic History    Marital status: Married     Spouse name: Not on file    Number of children: Not on file    Years of education: Not on file    Highest education level: Not on file   Occupational History    Not on file   Tobacco Use    Smoking status: Never Smoker    Smokeless tobacco: Never Used   Vaping Use    Vaping Use: Never used   Substance and Sexual Activity    Alcohol use: Not on file    Drug use: Not on file    Sexual activity: Not on file   Other Topics Concern    Not on file   Social History Narrative    Not on file     Social Determinants of Health     Financial Resource Strain:     Difficulty of Paying Living Expenses: Not on file   Food Insecurity:     Worried About Running Out of Food in the Last Year: Not on file    Ran Out of Food in the Last Year: Not on file   Transportation Needs:     Lack of Transportation (Medical): Not  on file    Lack of Transportation (Non-Medical): Not on file   Physical Activity:     Days of Exercise per Week: Not on file    Minutes of Exercise per Session: Not on file   Stress:     Feeling of Stress : Not on file   Social Connections:     Frequency of Communication with Friends and Family: Not on file    Frequency of Social Gatherings with Friends and Family: Not on file    Attends Religious Services: Not on file    Active Member of Clubs or Organizations: Not on file    Attends Banker Meetings: Not on file    Marital Status: Not on file   Intimate Partner Violence:     Fear of Current or Ex-Partner: Not on file    Emotionally Abused: Not on file    Physically Abused: Not on file    Sexually Abused: Not on file   Housing Stability:     Unable to Pay for Housing in the Last Year: Not on file    Number of Places Lived in the Last Year: Not on file    Unstable Housing in the Last Year: Not on file     Allergies:     Allergies   Allergen Reactions    Codeine Nausea And Vomiting    Crestor [Rosuvastatin] Rash     D/c due to severe rash and higher blood sugars    Glucosamine Hives and Nausea And Vomiting    Latex Rash    Tree Nuts Hives     ESPECIALLY ALMONDS     Eggs Or Egg-Derived Products      Patient had an allergy test and stated that she was allergic to eggs    Gabapentin  Dizziness and rash, irregular heart rate    Invokana [Canagliflozin]     Levemir [Insulin Detemir] Nausea And Vomiting    Lyrica [Pregabalin]      PT STATES IT MAKES HER NEUROPATHY WORSE STATES SHE FEELS LIKE SHE IS ON FIRE    Metformin      diarrhea    Evaristo Bury [Insulin Degludec]     Victoza [Liraglutide] Nausea And Vomiting    Flaxseed [Linseed Oil] Rash    Omega 3 [Fish Oil] Rash    Peanut-Containing Drug Products Rash     Medications:     Discharge Medication List as of 10/28/2020 11:20 AM      CONTINUE these medications which have NOT CHANGED    Details   aspirin 81 MG chewable tablet  Chew 81 mg by mouth daily TAKES SOME DAYS  , Historical Med      glipiZIDE (GLUCOTROL) 10 MG 24 hr tablet Take 1 tablet (10 mg total) by mouth daily, Starting Wed 10/07/2020, Until Thu 10/07/2021, E-Rx      glucose blood test strip Check sugar three times a day, ICD 10 E11.9, E-Rx      LORazepam (ATIVAN) 0.5 MG tablet Take 0.5 tablets (0.25 mg total) by mouth nightly as needed for Anxiety, Starting Thu 09/05/2019, E-Rx      losartan (COZAAR) 50 MG tablet Take 1 tablet (50 mg total) by mouth 2 (two) times daily, Starting Wed 10/07/2020, E-Rx      pravastatin (PRAVACHOL) 20 MG tablet Take 1 tablet (20 mg total) by mouth daily, Starting Mon 08/24/2020, Until Sat 02/20/2021, E-Rx      verapamil ER (CALAN-SR) 120 MG CR tablet Take 1 tablet (120 mg total) by mouth every morning, Starting Mon 09/28/2020, E-Rx      insulin glargine (LANTUS SOLOSTAR) 100 UNIT/ML injection pen Inject 10 units into skin daily at bedtime, Normal           Physical Exam:     Vitals:    10/28/20 1130   BP: 179/86   Pulse: 75   Resp:    Temp:    SpO2: 97%     Chest:  Clear with no abnormal breath sounds  Heart:  Regular rhythm, no significant murmurs.  Abdomen:  Soft, nontender, no mass or hepatosplenomegaly.    Labs:     Rads:     Signed by: Philbert Riser, MD

## 2020-11-10 ENCOUNTER — Encounter (INDEPENDENT_AMBULATORY_CARE_PROVIDER_SITE_OTHER): Payer: Self-pay | Admitting: Family Medicine

## 2020-11-17 ENCOUNTER — Other Ambulatory Visit (INDEPENDENT_AMBULATORY_CARE_PROVIDER_SITE_OTHER): Payer: Self-pay

## 2020-11-17 DIAGNOSIS — E1165 Type 2 diabetes mellitus with hyperglycemia: Secondary | ICD-10-CM

## 2020-11-17 NOTE — Telephone Encounter (Signed)
Refill request   Previous was printed

## 2020-11-19 ENCOUNTER — Telehealth (INDEPENDENT_AMBULATORY_CARE_PROVIDER_SITE_OTHER): Payer: Self-pay

## 2020-11-19 NOTE — Telephone Encounter (Signed)
Pt phoned in to say the pharmacy is waiting on a prior auth for her praluent. I shared that there was a communication that it was recommended she change to Repatha and she said that is not right, she just needs a prior [place]. I phoned her [employer] insurance co and they were not able to find a current account for her. I phoned [facility] to see if they could help. Repatha went through, is covered by her insurance and has a $27 co pay. I phoned pt back and she states understanding. She will contact [facility] to pick up the repatha. She is encouraged to phone back with any additional questions or concerns.

## 2020-11-20 MED ORDER — LANTUS SOLOSTAR 100 UNIT/ML SC SOPN
PEN_INJECTOR | SUBCUTANEOUS | 1 refills | Status: DC
Start: 2020-11-20 — End: 2023-01-09

## 2020-11-24 ENCOUNTER — Encounter (INDEPENDENT_AMBULATORY_CARE_PROVIDER_SITE_OTHER): Payer: Self-pay

## 2020-11-25 ENCOUNTER — Encounter (INDEPENDENT_AMBULATORY_CARE_PROVIDER_SITE_OTHER): Payer: Self-pay

## 2020-11-26 ENCOUNTER — Other Ambulatory Visit (INDEPENDENT_AMBULATORY_CARE_PROVIDER_SITE_OTHER): Payer: Self-pay | Admitting: Family Medicine

## 2020-11-26 DIAGNOSIS — E782 Mixed hyperlipidemia: Secondary | ICD-10-CM

## 2020-12-02 ENCOUNTER — Encounter (INDEPENDENT_AMBULATORY_CARE_PROVIDER_SITE_OTHER): Payer: Self-pay | Admitting: Family Medicine

## 2020-12-07 ENCOUNTER — Ambulatory Visit
Admission: RE | Admit: 2020-12-07 | Discharge: 2020-12-07 | Disposition: A | Discharge status: Home / Self Care | Payer: Medicare Other | Source: Ambulatory Visit | Attending: Gastroenterology | Admitting: Gastroenterology

## 2020-12-07 DIAGNOSIS — K862 Cyst of pancreas: Secondary | ICD-10-CM

## 2020-12-08 ENCOUNTER — Ambulatory Visit
Admission: RE | Admit: 2020-12-08 | Discharge: 2020-12-08 | Disposition: A | Discharge status: Home / Self Care | Payer: Medicare Other | Source: Ambulatory Visit | Attending: Gastroenterology | Admitting: Gastroenterology

## 2020-12-08 DIAGNOSIS — K862 Cyst of pancreas: Secondary | ICD-10-CM | POA: Insufficient documentation

## 2020-12-08 LAB — WHOLE BLOOD CREATININE WITH GFR POCT
GFR POCT: >60 mL/min/{1.73_m2} (ref >60)
Whole Blood Creatinine POCT: 0.8 mg/dL (ref 0.5–1.1)

## 2020-12-08 MED ORDER — GADOTERATE MEGLUMINE 10 MMOL/20ML IV SOLN (CLARISCAN)
0.2000 mL/kg | Freq: Once | INTRAVENOUS | Status: AC | PRN
Start: 2020-12-08 — End: 2020-12-08
  Administered 2020-12-08: 19 mL via INTRAVENOUS

## 2020-12-09 ENCOUNTER — Ambulatory Visit
Admission: RE | Admit: 2020-12-09 | Discharge: 2020-12-09 | Disposition: A | Discharge status: Home / Self Care | Payer: Medicare Other | Source: Ambulatory Visit | Attending: Gastroenterology | Admitting: Gastroenterology

## 2020-12-09 ENCOUNTER — Other Ambulatory Visit: Payer: Self-pay | Admitting: Gastroenterology

## 2020-12-09 ENCOUNTER — Ambulatory Visit: Admission: RE | Admit: 2020-12-09 | Payer: Self-pay | Source: Ambulatory Visit

## 2020-12-09 DIAGNOSIS — K862 Cyst of pancreas: Secondary | ICD-10-CM

## 2020-12-11 ENCOUNTER — Encounter (INDEPENDENT_AMBULATORY_CARE_PROVIDER_SITE_OTHER): Payer: Self-pay | Admitting: Family Medicine

## 2020-12-13 NOTE — Progress Notes (Deleted)
Date: 12/14/2020 9:41 AM   Patient ID: Emily Rowe is a 70 y.o. female.    Chief Complaint:  No chief complaint on file.      HPI:  70 year old female with diabetes, stroke, hypertension, recent hospitalization status post colonoscopy for perforation in for follow-up   Since her last visit she had followed up with gastroenterologist  She has a follow-up colonoscopy according to her this Thursday  Still feeling bloated  No abdominal pain    Since last visit taking her pravastatin without any side effects  She is happy able to tolerate this medication as previous statins was given her muscle aches    Diabetes-she uses insulin only as needed  Currently taking glipizide 10 mg by mouth once a day, Tradjenta 5 mg  Seeing endocrinologist        Seen psychologist and then psychiatrist- Dr Melonie [place]- lifestent  On Pristiq   no s/e     seeing cardiologist        S/p colonoscopy had bowel perforation  Seen Dr Molli Hazard scriven- surgeon- had swallow endoscopy      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  Dr [name] sent me a note:  Edrick Oh, MD  Rebecca Eaton, MD  1. Virtual colonoscopy suboptimal distension but negative. Probably would do a yearly FIT for f/u on this pt given hx   2. 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 Would repeat MR in 2 y     Since last stroke been dizzi a lot  Last 3 days feel more dizziness  On and off headache  The patient has stable chronic, essential HTN with no evidence of CHF or proteinuria.  The patient is compliant with anti-hypertensive therapy and denies side effects to therapy.  Pt denies CP, SOB, dizziness, orthopnea, PND or edema.  Currently she is taking verapamil 120 mg in the morning and losartan 50 mg two in the evening    EGD  10/2020      The other 14 point review systems are negative except those in HPI.    Current Medications:  No outpatient medications have been marked as taking for the 12/14/20 encounter (Appointment)  with Rebecca Eaton, MD.       Past Medical History:  Past Medical History:   Diagnosis Date    Anxiety     Asthma     allergic reaction to mold, not since 20 years, HAS WHEEZING IN COLD WEATHER. NO INHALERS    Bowel perforation     COLONOSCOPY 09/2019    Depression     Diabetes     Difficulty walking     WEAK RIGHT LEG AFTER CVA 2021    Dysphagia     BIG PILLS ONLY     Gastroesophageal reflux disease     PREVACID OTC PRN     Hyperlipemia     Hypertensive disorder     Hypokalemia     CMP REQ PCP 09/14/20    Neuropathy     NUMBNESS RIGHT UE AND LE     Post-operative nausea and vomiting     ALWAYS WITH ANESTHESIA     Sleep apnea 2017    NO CPAP NEEDED PER PT     Stroke 2012     ARM, LEG WEAK. WALKS WITH CANE, WALKER    TIA (transient ischemic attack)     MULTIPLE BEFORE CVA 2012    Type 2 diabetes mellitus,  controlled 2021    MANAGED BY DR GUNDU RAO. AM FSB 117-140. TAKES LANTUS PRN IN AM. HAIC 7.9       Past Surgical History:  Past Surgical History:   Procedure Laterality Date    BREAST BIOPSY Left     benign lump biopsy    COLONOSCOPY      X 2    COLONOSCOPY N/A 09/17/2020    Procedure: COLONOSCOPY;  Surgeon: Edrick Oh, MD;  Location: [facility];  Service: Gastroenterology;  Laterality: N/A;    EGD N/A 10/28/2020    Procedure: EGD w/ bx's;  Surgeon: Philbert Riser, MD;  Location: [facility];  Service: Gastroenterology;  Laterality: N/A;    HYSTERECTOMY      2005    OOPHORECTOMY Bilateral     2005    REDUCTION MAMMAPLASTY      TONSILLECTOMY      urinary bladder      BLADDER LIFT        Family History:  Family History   Problem Relation Age of Onset    Diabetes Mother     Heart attack Mother     Diabetes Maternal Grandmother     Stroke Maternal Grandmother     Breast cancer Daughter 30    Stroke Maternal Grandfather        Social History:   reports that she has never smoked. She has never used smokeless tobacco.    Allergies:  Allergies   Allergen  Reactions    Codeine Nausea And Vomiting    Crestor [Rosuvastatin] Rash     D/c due to severe rash and higher blood sugars    Glucosamine Hives and Nausea And Vomiting    Latex Rash    Tree Nuts Hives     ESPECIALLY ALMONDS     Eggs Or Egg-Derived Products      Patient had an allergy test and stated that she was allergic to eggs    Gabapentin      Dizziness and rash, irregular heart rate    Invokana [Canagliflozin]     Levemir [Insulin Detemir] Nausea And Vomiting    Lyrica [Pregabalin]      PT STATES IT MAKES HER NEUROPATHY WORSE STATES SHE FEELS LIKE SHE IS ON FIRE    Metformin      diarrhea    Evaristo Bury [Insulin Degludec]     Victoza [Liraglutide] Nausea And Vomiting    Flaxseed [Linseed Oil] Rash    Omega 3 [Fish Oil] Rash    Peanut-Containing Drug Products Rash        The following sections were reviewed this encounter by the provider:        VITALS:  There were no vitals filed for this visit.     ROS:  General/Constitutional:   Denies Chills. Denies Fatigue. Denies Fever.   Ophthalmologic:   Denies Blurred vision. Denies Eye Pain.   ENT:   Denies Nasal Discharge. Denies Ear pain. Denies Sinus pain.   Endocrine:   Denies Polydipsia. Denies Polyuria.   Respiratory:   Denies Cough. Denies Orthopnea. Denies Shortness of breath. Denies Wheezing.   Cardiovascular:   Denies Chest pain. Denies Chest pain with exertion. Denies Leg Claudication. Denies      Palpitations. Denies Swelling in hands/feet.   Gastrointestinal:   Denies Abdominal pain. Denies Blood in stool. Denies Constipation. Denies Diarrhea.      Denies Heartburn. Denies Nausea. Denies Vomiting.   Genitourinary:   Denies Blood in urine. Denies  Frequent urination. Denies Painful urination.   Musculoskeletal:   + Leg cramps. + Muscle aches.   Skin:   Denies Skin lesion(s).   Neurologic:   Denies Dizziness. +Gait abnormality. Denies Headache. +  Tingling/Numbness.     Physical Exam:  Alert and oriented. Affect is normal and appropriate. Clear  thought process with normal reasoning and conversational tone and logic. No psychomotor retardation. Not in any acute distress  Skin: no rash or abnormal lesions  Eyes - normal eye movements.  Pupils equal and reactive to light  Eyelid normal on exam  Ears - TM clear, external ear normal  Nose - no acute changes  Throat - Oral mucosa moist and pink, no abnormality noted  Neck- No thyroid enlargement noted, no palpable cervical lymph nodes  HEART: S1S2 heard, regular rate and rhythm  LUNGS: good air exchange, no crackles or ronchi or wheezing  ABDOMEN: Positive bowel sounds, no hepatospleenomegaly appreciated, no tenderness   Back: Normal curvature, no tenderness.   Extremities: FROM, no deformities, no edema, no erythema.   No knee effusion.   mild tendernesson the joint line.  Neuro: Physiological, speech better than before.   Right side weakness.   Restricted and only able to abduct <20%, Right leg weakness.   Strength RUE and RLE 3/5.         Skin: Normal, no rashes, no lesions noted.   Extremities: Warm, well perfused, no edema. Fingers stiff with some contracture L> R  Extremity-no open wound    Lab Results   Component Value Date    WBC 7.99 09/11/2020    HGB 12.5 09/11/2020    HCT 40.2 09/11/2020    PLT 147 09/11/2020    CHOL 215 (H) 09/11/2020    TRIG 143 09/11/2020    HDL 47 09/11/2020    LDL 139 (H) 09/11/2020    ALT 15 09/11/2020    AST 16 09/11/2020    NA 140 09/11/2020    K 4.3 09/11/2020    CL 103 09/11/2020    CREAT 1.0 09/11/2020    BUN 19.0 09/11/2020    CO2 29 09/11/2020    TSH 2.40 09/11/2020    INR 1.0 05/10/2019    GLU 149 (H) 09/11/2020    HGBA1C 7.9 (H) 09/11/2020     Assessment/Plan:       There are no diagnoses linked to this encounter.    Patient currently has no bloating signs and symptoms  She is following up closely with a gastroenterologist and colorectal surgeon    Hyperlipidemia-chronic  Need to be on a statin  Patient has been not taking medications because of side effects-last visit  she agreed to try pravastatin and has been tolerating it  Lipid panel discussed with patient-improving LDL but still high-has been only on pravastatin 2 months-we will recheck it in 3 months    Diabetes-slightly elevated A1c at 7.9  Continue glipizide and Tradjenta and follow-up with her endocrinologist Dr.  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 pylori    Importance of getting her shingles vaccine discussed-she will do it at outside pharmacy  Also recommended getting her third COVID-19 vaccine-she  wants to do it after her follow-up with gastroenterologist    No follow-ups on file.    This note was generated by the Epic EMR system/ Dragon speech recognition and may contain inherent errors or omissions not intended by the user. Grammatical errors, random word insertions, deletions, pronoun errors and incomplete sentences are occasional consequences of this technology due to software limitations. Not all errors are caught or corrected. If there are questions or concerns about the content of this note or information contained within the body of this dictation they should be addressed directly with the author for clarification      Rebecca Eaton, MD

## 2020-12-14 ENCOUNTER — Ambulatory Visit (INDEPENDENT_AMBULATORY_CARE_PROVIDER_SITE_OTHER): Payer: Medicare Other | Admitting: Family Medicine

## 2020-12-15 ENCOUNTER — Encounter (INDEPENDENT_AMBULATORY_CARE_PROVIDER_SITE_OTHER): Payer: Self-pay

## 2020-12-17 NOTE — Telephone Encounter (Signed)
Repatha not on the covered drug list. Per [employer], rx needs PA or change med to another that is on their formulary

## 2020-12-23 ENCOUNTER — Encounter (INDEPENDENT_AMBULATORY_CARE_PROVIDER_SITE_OTHER): Payer: Self-pay

## 2020-12-30 ENCOUNTER — Encounter (INDEPENDENT_AMBULATORY_CARE_PROVIDER_SITE_OTHER): Payer: Self-pay | Admitting: Family Medicine

## 2020-12-30 ENCOUNTER — Ambulatory Visit (INDEPENDENT_AMBULATORY_CARE_PROVIDER_SITE_OTHER): Payer: Medicare Other | Admitting: Family Medicine

## 2020-12-30 VITALS — BP 157/88 | HR 79 | Temp 97.7°F | Resp 16 | Wt 210.0 lb

## 2020-12-30 DIAGNOSIS — E1165 Type 2 diabetes mellitus with hyperglycemia: Secondary | ICD-10-CM

## 2020-12-30 DIAGNOSIS — E669 Obesity, unspecified: Secondary | ICD-10-CM

## 2020-12-30 DIAGNOSIS — G629 Polyneuropathy, unspecified: Secondary | ICD-10-CM

## 2020-12-30 DIAGNOSIS — I69359 Hemiplegia and hemiparesis following cerebral infarction affecting unspecified side: Secondary | ICD-10-CM

## 2020-12-30 DIAGNOSIS — E1142 Type 2 diabetes mellitus with diabetic polyneuropathy: Secondary | ICD-10-CM

## 2020-12-30 DIAGNOSIS — Z23 Encounter for immunization: Secondary | ICD-10-CM

## 2020-12-30 DIAGNOSIS — I10 Essential (primary) hypertension: Secondary | ICD-10-CM

## 2020-12-30 DIAGNOSIS — E782 Mixed hyperlipidemia: Secondary | ICD-10-CM

## 2020-12-30 LAB — COMPREHENSIVE METABOLIC PANEL
ALT: 17 U/L (ref 0–55)
AST (SGOT): 18 U/L (ref 5–34)
Albumin/Globulin Ratio: 1.2 (ref 0.9–2.2)
Albumin: 3.8 g/dL (ref 3.5–5.0)
Alkaline Phosphatase: 96 U/L (ref 37–117)
Anion Gap: 10 (ref 5.0–15.0)
BUN: 15 mg/dL (ref 7.0–19.0)
Bilirubin, Total: 0.4 mg/dL (ref 0.2–1.2)
CO2: 30 mEq/L — ABNORMAL HIGH (ref 21–29)
Calcium: 9.3 mg/dL (ref 8.5–10.5)
Chloride: 100 mEq/L (ref 100–111)
Creatinine: 0.9 mg/dL (ref 0.4–1.5)
Globulin: 3.2 g/dL (ref 2.0–3.7)
Glucose: 170 mg/dL — ABNORMAL HIGH (ref 70–100)
Potassium: 4.6 mEq/L (ref 3.5–5.1)
Protein, Total: 7 g/dL (ref 6.0–8.3)
Sodium: 140 mEq/L (ref 136–145)

## 2020-12-30 LAB — GFR: EGFR: >60

## 2020-12-30 LAB — HEMOGLOBIN A1C
Average Estimated Glucose: 200.1 mg/dL
Hemoglobin A1C: 8.6 % — ABNORMAL HIGH (ref 4.6–5.9)

## 2020-12-30 LAB — MICROALBUMIN, RANDOM URINE
Urine Creatinine, Random: 152.1 mg/dL
Urine Microalbumin, Random: 15 (ref 0.0–30.0)
Urine Microalbumin/Creatinine Ratio: 10 ug/mg (ref 0–30)

## 2020-12-30 LAB — LIPID PANEL
Cholesterol / HDL Ratio: 4.3
Cholesterol: 193 mg/dL (ref 0–199)
HDL: 45 mg/dL (ref 40–9999)
LDL Calculated: 120 mg/dL — ABNORMAL HIGH (ref 0–99)
Triglycerides: 139 mg/dL (ref 34–149)
VLDL Calculated: 28 mg/dL (ref 10–40)

## 2020-12-30 LAB — THYROID STIMULATING HORMONE (TSH), REFLEX ON ABNORMAL TO FREE T4, SERUM: TSH, Abn Reflex to Free T4, Serum: 2.63 u[IU]/mL (ref 0.35–4.94)

## 2020-12-30 LAB — HEMOLYSIS INDEX: Hemolysis Index: 16 (ref 0–24)

## 2020-12-30 NOTE — Progress Notes (Signed)
Have you seen any specialists/other providers since your last visit with [place]?    Yes    Arm preference verified?   Yes    The patient is due for eye exam, shingles vaccine and AD, Medicare Wellness      Date: 12/30/2020 8:37 AM   Patient ID: Emily Rowe is a 70 y.o. female.    Chief Complaint:  Chief Complaint   Patient presents with    Diabetes Follow-up     PT IS FASTING       HPI:  70 year old female with diabetes, stroke, hypertension, recent hospitalization status post colonoscopy for perforation in for follow-up     Seen Gi DR Shiela Mayer  Had EGD- 10/2020- duodenitis, gastritis- started on dexilant and sucralfate  S/e to sucralfate  Colonoscopy 10/05/2020-diverticulosis    Diabetes-she uses insulin only as needed  Currently taking glipizide 10 mg by mouth once a day, Tradjenta 5 mg  Seeing endocrinologist  Seen ophthal and seen retina sepcialist- seen last month  Lab Results   Component Value Date    HGBA1C 7.9 (H) 09/11/2020   no hypoglycemia  + neuropathy s/s  Seeing podiatrist Dr Rito Ehrlich last seen 3 months ago        Seen psychologist and then psychiatrist- Dr Melonie Florida- lifestent  given Pristiq - stopped as causing BP High  Now attending counseling     seeing cardiologist  intolerance to statin  Now on repatha      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  Dr [name] sent me a note:  Edrick Oh, MD  Rebecca Eaton, MD  1. Virtual colonoscopy suboptimal distension but negative. Probably would do a yearly FIT for f/u on this pt given hx   2. 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 Would repeat MR in 2 y     The patient has stable chronic, essential HTN with no evidence of CHF or proteinuria.  The patient is compliant with anti-hypertensive therapy and denies side effects to therapy.  Pt denies CP, SOB, dizziness, orthopnea, PND or edema.  Currently she is taking verapamil 120 mg in the morning and losartan 50 mg two in the evening    Doing  better  Physical therapy has tremendously helped her  Denies having any new complaints or concerns    The other 14 point review systems are negative except those in HPI.    Current Medications:  Outpatient Medications Marked as Taking for the 12/30/20 encounter (Office Visit) with Rebecca Eaton, MD   Medication Sig Dispense Refill    aspirin 81 MG chewable tablet Chew 81 mg by mouth daily TAKES SOME DAYS        evolocumab (REPATHA) 140 MG/ML subcutaneous auto-injector Inject 1 mL (140 mg total) into the skin every 14 (fourteen) days 6 pen 2    glipiZIDE (GLUCOTROL) 10 MG 24 hr tablet Take 1 tablet (10 mg total) by mouth daily 30 tablet 0    glucose blood test strip Check sugar three times a day, ICD 10 E11.9 100 each 10    insulin glargine (LANTUS SOLOSTAR) 100 UNIT/ML injection pen Inject 10 units into skin daily at bedtime ONLY AS NEEDED IF BLOOD SUGAR >OR = TO 170  LAST TAKEN 07/2020 15 mL 1    losartan (COZAAR) 50 MG tablet Take 1 tablet (50 mg total) by mouth 2 (two) times daily (Patient taking differently: Take 100 mg by mouth every evening   )  60 tablet 0    pantoprazole (PROTONIX) 40 MG tablet Take 40 mg by mouth daily      verapamil ER (CALAN-SR) 120 MG CR tablet Take 1 tablet (120 mg total) by mouth every morning 90 tablet 1    [DISCONTINUED] LORazepam (ATIVAN) 0.5 MG tablet Take 0.5 tablets (0.25 mg total) by mouth nightly as needed for Anxiety (Patient taking differently: Take 0.5 mg by mouth nightly as needed for Anxiety   ) 20 tablet 0    [DISCONTINUED] pravastatin (PRAVACHOL) 20 MG tablet TAKE 1 TABLET(20 MG) BY MOUTH DAILY 90 tablet 0       Past Medical History:  Past Medical History:   Diagnosis Date    Anxiety     Asthma     allergic reaction to mold, not since 20 years, HAS WHEEZING IN COLD WEATHER. NO INHALERS    Bowel perforation     COLONOSCOPY 09/2019    Depression     Diabetes     Difficulty walking     WEAK RIGHT LEG AFTER CVA 2021    Dysphagia     BIG PILLS ONLY      Gastroesophageal reflux disease     PREVACID OTC PRN     Hyperlipemia     Hypertensive disorder     Hypokalemia     CMP REQ PCP 09/14/20    Neuropathy     NUMBNESS RIGHT UE AND LE     Post-operative nausea and vomiting     ALWAYS WITH ANESTHESIA     Sleep apnea 2017    NO CPAP NEEDED PER PT     Stroke 2012     ARM, LEG WEAK. WALKS WITH CANE, WALKER    TIA (transient ischemic attack)     MULTIPLE BEFORE CVA 2012    Type 2 diabetes mellitus, controlled 2021    MANAGED BY DR Binnie Rail RAO. AM FSB 117-140. TAKES LANTUS PRN IN AM. HAIC 7.9       Past Surgical History:  Past Surgical History:   Procedure Laterality Date    BREAST BIOPSY Left     benign lump biopsy    COLONOSCOPY      X 2    COLONOSCOPY N/A 09/17/2020    Procedure: COLONOSCOPY;  Surgeon: Edrick Oh, MD;  Location: [facility];  Service: Gastroenterology;  Laterality: N/A;    EGD N/A 10/28/2020    Procedure: EGD w/ bx's;  Surgeon: Philbert Riser, MD;  Location: [facility];  Service: Gastroenterology;  Laterality: N/A;    HYSTERECTOMY      2005    OOPHORECTOMY Bilateral     2005    REDUCTION MAMMAPLASTY      TONSILLECTOMY      urinary bladder      BLADDER LIFT        Family History:  Family History   Problem Relation Age of Onset    Diabetes Mother     Heart attack Mother     Diabetes Maternal Grandmother     Stroke Maternal Grandmother     Breast cancer Daughter 30    Stroke Maternal Grandfather        Social History:   reports that she has never smoked. She has never used smokeless tobacco.    Allergies:  Allergies   Allergen Reactions    Codeine Nausea And Vomiting    Crestor [Rosuvastatin] Rash     D/c due to severe rash and higher blood sugars  Glucosamine Hives and Nausea And Vomiting    Latex Rash    Tree Nuts Hives     ESPECIALLY ALMONDS     Eggs Or Egg-Derived Products      Patient had an allergy test and stated that she was allergic to eggs    Gabapentin      Dizziness and rash, irregular  heart rate    Invokana [Canagliflozin]     Levemir [Insulin Detemir] Nausea And Vomiting    Lyrica [Pregabalin]      PT STATES IT MAKES HER NEUROPATHY WORSE STATES SHE FEELS LIKE SHE IS ON FIRE    Metformin      diarrhea    Sucralfate Nausea And Vomiting     BODY ACHES, FEVER    Evaristo Bury [Insulin Degludec]     Victoza [Liraglutide] Nausea And Vomiting    Flaxseed [Linseed Oil] Rash    Omega 3 [Fish Oil] Rash    Peanut-Containing Drug Products Rash        The following sections were reviewed this encounter by the provider:   Tobacco   Allergies   Meds   Problems   Med Hx   Surg Hx   Fam Hx          VITALS:  Vitals:    12/30/20 0802   BP: 157/88   BP Site: Left arm   Patient Position: Sitting   Cuff Size: Large   Pulse: 79   Resp: 16   Temp: 97.7 F (36.5 C)   TempSrc: Oral   SpO2: 96%   Weight: 95.3 kg (210 lb)        ROS:  General/Constitutional:   Denies Chills. Denies Fatigue. Denies Fever.   Ophthalmologic:   Denies Blurred vision. Denies Eye Pain.   ENT:   Denies Nasal Discharge. Denies Ear pain. Denies Sinus pain.   Endocrine:   Denies Polydipsia. Denies Polyuria.   Respiratory:   Denies Cough. Denies Orthopnea. Denies Shortness of breath. Denies Wheezing.   Cardiovascular:   Denies Chest pain. Denies Chest pain with exertion. Denies Leg Claudication. Denies      Palpitations. Denies Swelling in hands/feet.   Gastrointestinal:   Denies Abdominal pain. Denies Blood in stool. Denies Constipation. Denies Diarrhea.      Denies Heartburn. Denies Nausea. Denies Vomiting.   Genitourinary:   Denies Blood in urine. Denies Frequent urination. Denies Painful urination.   Musculoskeletal:   + Leg cramps. + Muscle aches.   Skin:   Denies Skin lesion(s).   Neurologic:   Denies Dizziness. +Gait abnormality. Denies Headache. +  Tingling/Numbness.     Physical Exam:  Alert and oriented. Affect is normal and appropriate. Clear thought process with normal reasoning and conversational tone and logic. No psychomotor  retardation. Not in any acute distress  Skin: no rash or abnormal lesions  Eyes - normal eye movements.  Pupils equal and reactive to light  Eyelid normal on exam  Ears - TM clear, external ear normal  Nose - no acute changes  Throat - Oral mucosa moist and pink, no abnormality noted  Neck- No thyroid enlargement noted, no palpable cervical lymph nodes  HEART: S1S2 heard, regular rate and rhythm  LUNGS: good air exchange, no crackles or ronchi or wheezing  ABDOMEN: Positive bowel sounds, no hepatospleenomegaly appreciated, no tenderness   Back: Normal curvature, no tenderness.   Extremities: FROM, no deformities, no edema, no erythema.   No knee effusion.   mild tendernesson the joint line.  Neuro: Physiological,  speech better than before.   Right side weakness.   Restricted and only able to abduct <20%, Right leg weakness.   Strength RUE and RLE 3/5.         Skin: Normal, no rashes, no lesions noted.   Extremities: Warm, well perfused, no edema. Fingers stiff with some contracture L> R  Foot exam-deferred by pt as seeing podiatrist and seen recently    Lab Results   Component Value Date    WBC 7.99 09/11/2020    HGB 12.5 09/11/2020    HCT 40.2 09/11/2020    PLT 147 09/11/2020    CHOL 215 (H) 09/11/2020    TRIG 143 09/11/2020    HDL 47 09/11/2020    LDL 139 (H) 09/11/2020    ALT 15 09/11/2020    AST 16 09/11/2020    NA 140 09/11/2020    K 4.3 09/11/2020    CL 103 09/11/2020    CREAT 1.0 09/11/2020    BUN 19.0 09/11/2020    CO2 29 09/11/2020    TSH 2.40 09/11/2020    INR 1.0 05/10/2019    GLU 149 (H) 09/11/2020    HGBA1C 7.9 (H) 09/11/2020     Assessment/Plan:       1. Uncontrolled type 2 diabetes mellitus with hyperglycemia  - Comprehensive metabolic panel  - Hemoglobin A1C  - Lipid panel  - Urine Microalbumin Random    2. Diabetic peripheral neuropathy  - Comprehensive metabolic panel  - Hemoglobin A1C  - Lipid panel  - Urine Microalbumin Random  - Pneumococcal conjugate vaccine 13-valent  - TSH, Abn Reflex to  Free T4, Serum    3. History of hemorrhagic stroke with residual hemiparesis  - TSH, Abn Reflex to Free T4, Serum    4. Essential hypertension  - Comprehensive metabolic panel  - Hemoglobin A1C  - Lipid panel  - Urine Microalbumin Random    5. Mixed hyperlipidemia  - Comprehensive metabolic panel  - Hemoglobin A1C  - Lipid panel  - Urine Microalbumin Random    6. Need for vaccination  - Pneumococcal conjugate vaccine 13-valent    7. Neuropathy  - TSH, Abn Reflex to Free T4, Serum    8. 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    This note was generated by the Epic EMR system/ Dragon speech recognition and may contain inherent errors or omissions not intended by the user. Grammatical errors, random word insertions, deletions, pronoun errors and incomplete sentences are occasional consequences of this technology due to software limitations. Not all errors are caught or corrected. If there are questions or concerns about the content of this note or information contained within the  body of this dictation they should be addressed directly with the author for clarification      Rebecca Eaton, MD

## 2021-01-01 ENCOUNTER — Telehealth (INDEPENDENT_AMBULATORY_CARE_PROVIDER_SITE_OTHER): Payer: Self-pay

## 2021-01-01 ENCOUNTER — Encounter (INDEPENDENT_AMBULATORY_CARE_PROVIDER_SITE_OTHER): Payer: Self-pay

## 2021-01-01 NOTE — Telephone Encounter (Signed)
WUJ:[id number]  Initiated Repatha prior authorization via CoverMyMeds. Answered clinical questions and submitted supporting documents.  Status: PENDING  Updated patient via MyChart message.

## 2021-01-05 ENCOUNTER — Encounter (INDEPENDENT_AMBULATORY_CARE_PROVIDER_SITE_OTHER): Payer: Self-pay

## 2021-01-05 ENCOUNTER — Other Ambulatory Visit (INDEPENDENT_AMBULATORY_CARE_PROVIDER_SITE_OTHER): Payer: Self-pay

## 2021-01-05 DIAGNOSIS — E785 Hyperlipidemia, unspecified: Secondary | ICD-10-CM

## 2021-01-05 DIAGNOSIS — E782 Mixed hyperlipidemia: Secondary | ICD-10-CM

## 2021-01-05 DIAGNOSIS — I639 Cerebral infarction, unspecified: Secondary | ICD-10-CM

## 2021-01-05 MED ORDER — EVOLOCUMAB 140 MG/ML SC SOAJ
140.0000 mg | SUBCUTANEOUS | 3 refills | Status: DC
Start: 2021-01-05 — End: 2021-06-17

## 2021-01-05 NOTE — Telephone Encounter (Signed)
Received fax from [facility]. Repatha approved through 06/30/2021.    Updated pt and provider via MyChart message.

## 2021-01-06 ENCOUNTER — Telehealth (INDEPENDENT_AMBULATORY_CARE_PROVIDER_SITE_OTHER): Payer: Self-pay

## 2021-01-06 NOTE — Telephone Encounter (Signed)
-----   Message from Lynnell Dike, RN sent at 01/05/2021  4:57 PM EDT -----  Regarding: Repatha Approved  Good Afternoon,  Repatha approved through 06/30/2021.    EDP Pool - A refill request has been sent to the pharmacy. Pt updated via MyChart message. Please follow up with the pt as appropriate as it appears this is an initiation of therapy and may require a nurse appointment prior to administering the first dose.  Thank you,  -Molly Maduro

## 2021-01-06 NOTE — Telephone Encounter (Signed)
Called pt. Relayed information. Transferred to LO front desk to schedule nurse education visit for repatha training.

## 2021-01-11 ENCOUNTER — Ambulatory Visit (INDEPENDENT_AMBULATORY_CARE_PROVIDER_SITE_OTHER): Payer: Medicare Other

## 2021-01-11 DIAGNOSIS — E785 Hyperlipidemia, unspecified: Secondary | ICD-10-CM

## 2021-01-11 NOTE — Progress Notes (Signed)
Pt in office for Repatha teaching. Patient education provided with teaching material and training injection. Pt able to teach back and self-administer injection.  Answered all pt questions.  Certificate signed and sent to scanning for patient's chart.

## 2021-01-12 ENCOUNTER — Ambulatory Visit (INDEPENDENT_AMBULATORY_CARE_PROVIDER_SITE_OTHER): Payer: Medicare Other | Admitting: Cardiovascular Disease

## 2021-01-12 ENCOUNTER — Encounter (INDEPENDENT_AMBULATORY_CARE_PROVIDER_SITE_OTHER): Payer: Self-pay

## 2021-01-13 ENCOUNTER — Other Ambulatory Visit (INDEPENDENT_AMBULATORY_CARE_PROVIDER_SITE_OTHER): Payer: Self-pay | Admitting: Cardiovascular Disease

## 2021-01-13 DIAGNOSIS — I10 Essential (primary) hypertension: Secondary | ICD-10-CM

## 2021-01-15 ENCOUNTER — Telehealth (INDEPENDENT_AMBULATORY_CARE_PROVIDER_SITE_OTHER): Payer: Self-pay | Admitting: Family Medicine

## 2021-01-15 NOTE — Telephone Encounter (Signed)
Name, strength, directions of requested refill(s): Lorazepam 5mg        Pharmacy to send refill to or patient to pick up rx from office (mark requested pharmacy in BOLD):      [id number] [place], [place] - [address] AT [facility] [address]  [address]  [place] [address]  Phone: [phone] Fax: [phone]    [email or web address] - [place], [place] - [address]  [address]  [place] [address]  Phone: [phone] Fax: [phone]        Please mark "X" next to the preferred call back number:    Mobile:   Telephone Information:   Mobile [phone]    X   Home: @HOMEPHONE @    Work: @WORKPHONE @        Medication refill request , see above. Thank you     Additional Notes:     Next Visit:

## 2021-01-18 ENCOUNTER — Encounter (INDEPENDENT_AMBULATORY_CARE_PROVIDER_SITE_OTHER): Payer: Self-pay | Admitting: Family Medicine

## 2021-01-18 NOTE — Telephone Encounter (Signed)
PMP reviewed.  Last lorazepam was November 2020.  Not so sure if she has been taking this medication on a regular basis.  If she needs refill could you kindly see if the patient can set up a follow-up appointment-at least do a televisit.

## 2021-01-18 NOTE — Telephone Encounter (Signed)
Please call patient and ask her to schedule a visit with Dr. Delman Cheadle for her refill. Thank you!

## 2021-01-18 NOTE — Telephone Encounter (Signed)
Left a message for patient to call back to schedule an appointment.

## 2021-01-18 NOTE — Telephone Encounter (Signed)
Please call patient and check on this please  Patient requesting refill on her lorazepam..  I think patient is currently seeing a psychiatrist and need to keep her medications with them.

## 2021-01-18 NOTE — Telephone Encounter (Signed)
Patient states her Psychiatrist is no longer at the office and she was advised to call her PCP for a refill until she is able to get another Psychiatrist.

## 2021-01-19 ENCOUNTER — Encounter (INDEPENDENT_AMBULATORY_CARE_PROVIDER_SITE_OTHER): Payer: Self-pay | Admitting: Family Medicine

## 2021-01-19 ENCOUNTER — Other Ambulatory Visit (INDEPENDENT_AMBULATORY_CARE_PROVIDER_SITE_OTHER): Payer: Self-pay

## 2021-01-19 ENCOUNTER — Other Ambulatory Visit (INDEPENDENT_AMBULATORY_CARE_PROVIDER_SITE_OTHER): Payer: Self-pay | Admitting: Internal Medicine

## 2021-01-19 ENCOUNTER — Ambulatory Visit (INDEPENDENT_AMBULATORY_CARE_PROVIDER_SITE_OTHER): Payer: Medicare Other | Admitting: Family Medicine

## 2021-01-19 VITALS — BP 161/86 | HR 82 | Temp 98.0°F | Resp 16 | Wt 211.0 lb

## 2021-01-19 DIAGNOSIS — E1165 Type 2 diabetes mellitus with hyperglycemia: Secondary | ICD-10-CM

## 2021-01-19 DIAGNOSIS — F419 Anxiety disorder, unspecified: Secondary | ICD-10-CM

## 2021-01-19 DIAGNOSIS — E1142 Type 2 diabetes mellitus with diabetic polyneuropathy: Secondary | ICD-10-CM

## 2021-01-19 MED ORDER — LORAZEPAM 0.5 MG PO TABS
0.2500 mg | ORAL_TABLET | Freq: Every evening | ORAL | 0 refills | Status: DC | PRN
Start: 2021-01-19 — End: 2022-08-09

## 2021-01-19 MED ORDER — GLIPIZIDE ER 10 MG PO TB24
10.0000 mg | ORAL_TABLET | Freq: Two times a day (BID) | ORAL | 0 refills | Status: DC
Start: 2021-01-19 — End: 2021-10-26

## 2021-01-19 MED ORDER — TRADJENTA 5 MG PO TABS
5.0000 mg | ORAL_TABLET | Freq: Every day | ORAL | 3 refills | Status: DC
Start: 2021-01-19 — End: 2022-04-14

## 2021-01-19 NOTE — Progress Notes (Signed)
[facility]  [phone] [facility] [address] [place], [place] [address]     Emily Rowe    Date of Visit:  05/30/2018  Date of Birth: 12/01/1950  Age: 70 yrs.   Medical Record Number: [id number]  __  CURRENT DIAGNOSES     1. DM Type II, Uncomplicated,Unspecified,  250.00  2. Pure hypercholesterolemia, unspecified, E78.00  3. Essential (primary) hypertension, I10  __  ALLERGIES     Codeine, Feeling faint  Fish Oil, Intolerance-unknown  Flaxseed, Intolerance-unknown  Gabapentin, Intolerance-dizziness  Gabapentin, Irregular heart rate  Gabapentin, Rash  Glucosamine, Intolerance-unknown  Latex, Intolerance-unknown   nut, Intolerance-unknown  Simvastatin, Intolerance-unknown  __  MEDICATIONS     1. amlodipine  2.5 mg tablet, 2.5mg  QD (Takes as NOVANLO)  2. Tradjenta 5 mg tablet, 1 po QD  3. Evaristo Bury FlexTouch U-100 insulin 100 unit/mL (3 mL) subcutaneous pen, Take as Directed  4. rosuvastatin 20 mg tablet, 1 po QD  5. glipizide 10 mg tablet,  2 po qd  6. metoprolol succinate ER 50 mg tablet,extended release 24 hr, 1 po q am  7. lisinopril 40 mg tablet, 1 po q am  __  CHIEF COMPLAINT/REASON FOR VISIT   f/u  __  HISTORY OF PRESENT ILLNESS  Emily Rowe was last seen by Dr. Delbert Phenix. She has a history of hypertension and hyperlipidemia along with  palpitations. Her Body Guardian monitor last year was unremarkable. She has multiple medical intolerances and lately she is convinced that her diuretics (chlorthalidone and amiloride) have been causing her to feel generally poorly and she would like to  change them. Previously she had been on lisinopril, but her blood pressures did tend to drift up on that. She denies any recent palpitations.   __  PAST HISTORY      Past Medical Illnesses: Stroke, DM, HLD, HTN, Neuropathy;  Past Cardiac Illnesses : Palpitations; Infectious Diseases: No previous history of significant infectious diseases.; Surgical  Procedures: Tonsillectomy 1982, Hysterectomy 2006, polyps/1997 and 2008;  Cardiology Procedures-Noninvasive : Echocardiogram December 2016, Body Guardian 2018; Left Ventricular Ejection Fraction: LVEF of 65% documented via echocardiogram on 09/29/2015   PHYSICAL EXAMINATION    Vital Signs:  Blood Pressure:   140/88 Sitting, Left arm, large cuff    Weight: 201.00 lbs.  Height:  66.00"  BMI: 32.44   Pulse: 91/min. Apical        Constitutional: alert, oriented Chest: clear to auscultation bilaterally  Cardiac: Regular rhythm, S1 normal, S2 normal, No S3 or S4, no murmurs Abdomen: abdomen soft, non-tender  Extremities/Back: No clubbing, cyanosis or edema Neurological:  oriented to time, person and place   __    Medications added today by the physician:  lisinopril 40 mg tablet, 1 po q am, 30  metoprolol  succinate ER 50 mg tablet,extended release 24 hr, 1 po q am, 30  lisinopril 20 mg tablet, 1 po q am, 30      ECG:  ECG today shows sinus  rhythm with a burst of atrial tachycardia.     IMPRESSIONS:  1. Hypertension - she has multiple medical intolerances and says that she can no longer   tolerate her amiloride and chlorthalidone.   2. Previous palpitations with unremarkable Body Guardian monitor last year. Interestingly  her ECG today did show a short burst of atrial tachycardia which she is not particularly  symptomatic.    3. Hyperlipidemia.   4. Obesity.   5. Structurally normal heart by echocardiogram in 2016.   6.  Diabetes.     RECOMMENDATIONS:  1. Discontinue  chlorthalidone and amiloride.   2. Start Toprol-XL 50 mg daily and lisinopril 40 mg daily.   3. Check basic metabolic panel in one week.   4. Follow up with our APP in three to four weeks. She will continue to track her blood  pressures  at home.     Gayla Benn K. Brent Bulla, MD, [facility]    [id number]    cc: Elmore Guise MD  Chevis Pretty MD   ____________________________  Christianne Dolin   ZO:[id number] Education ICD-10: I10 MedlinePlus Connect results for ICD-10 I10  Basic Metabolic Panel 1 week  Return Visit with NP/PA 1 month  12  Lead ECG Today

## 2021-01-19 NOTE — Telephone Encounter (Signed)
Patient called requesting refills for Tradjenta to be sent to [facility] on file and Glipizide 10 mg BID to be sent to [facility]. Rx for Tradjenta has been expired unable to pend med.     UV 02/04/21  LV 09/29/20

## 2021-01-19 NOTE — H&P (Signed)
[facility]   [phone] [facility]. [address] [place], [place] [address]           Emily Rowe    Date of Visit:  09/17/2019  Date of Birth: 1951-05-12  Age: 70 yrs.   Medical Record Number: [id number]  __  CURRENT DIAGNOSES     1. Pure hypercholesterolemia, unspecified,  E78.00  2. Essential (primary) hypertension, I10  3. DM Type II, Uncomplicated,Unspecified, 250.00  __  ALLERGIES     Codeine, Feeling faint  Fish Oil, Intolerance-unknown  Flaxseed, Intolerance-unknown  Gabapentin, Intolerance-dizziness  Gabapentin, Irregular heart rate  Gabapentin, Rash  Glucosamine, Intolerance-unknown  Latex, Intolerance-unknown   Lyrica, Intolerance-unknown  nut, Intolerance-unknown  Simvastatin, Intolerance-unknown  __  MEDICATIONS      1. Tradjenta 5 mg tablet, 1 po QD  2. rosuvastatin 20 mg tablet, 1 po QD  3. glipizide 10 mg tablet, 2 po qd  4. chlorthalidone 25 mg tablet, 1 po prn, (takes as a combo pill from [place] DIUPRESS)  5. losartan 50 mg tablet,  2 or 3 qd  6. verapamil ER 120 mg 24 hr capsule,extended release, 1 or 2 po qd  7. lorazepam 0.5 mg tablet, 1/2 po qam  8. amitriptyline 10 mg tablet, 1 po qd  9. Lantus Solostar U-100 Insulin 100 unit/mL (3 mL) subcutaneous pen, as directed   __  CHIEF COMPLAINT/REASON FOR VISIT  Followup of Essential (primary) hypertension  __  HISTORY  OF PRESENT ILLNESS    70 year old female to history of diabetes, dyslipidemia and hypertension she reports blood pressures been elevated she denies a particular chest pain she does state that she was  tested for sleep apnea and was not recommended to use any CPAP in the past. She denies any leg edema.      __  PAST HISTORY      Past Medical Illnesses: Stroke, DM, HLD, HTN, Neuropathy;  Past Cardiac Illnesses : Palpitations; Infectious Diseases: No previous history of significant infectious diseases.; Surgical  Procedures: Tonsillectomy 1982, Hysterectomy 2006, polyps/1997 and 2008; Cardiology Procedures-Noninvasive :  Echocardiogram December 2016, Body Guardian 2018; Left Ventricular Ejection Fraction: LVEF of 65% documented via echocardiogram on 09/29/2015   ___  FAMILY HISTORY  Father -- Stroke  MaternalGrandparent --  Diabetes mellitus (GM)  Mother -- Myocardial infarction at greater than 60 (died from MI), Diabetes mellitus    __  CARDIAC RISK FACTORS      Tobacco Abuse: has never used tobacco; Family History of Heart Disease: positive;  Hyperlipidemia: positive; Hypertension: positive;   Diabetes Mellitus: positive; Prior History of Heart Disease: negative;  Obesity: BMI25 (Over weight); Sedentary Life Style:negative;  JYN:[id number]; Menopausal:positive  __   SOCIAL HISTORY    Alcohol Use: Does not use alcohol;  Smoking: never smoked; Never smoker ([id number]); Diet: Regular diet without modifications and Caffeine  use-1-2 per day; Exercise: Exercises occasionally;   __  PHYSICAL EXAMINATION     Vital Signs:  Blood Pressure:  166/90  Weight 107 pounds  Heart rate 76 bpm  BMI 31.8.       Constitutional: alert, oriented Skin: warm and dry to touch  Neck: no JVD, no bruits Chest: clear to auscultation bilaterally  Cardiac: Regular rhythm, S1 normal, S2 normal, No S3 or S4, no murmurs Abdomen: abdomen soft, non-tender  Extremities/Back: No clubbing, cyanosis or edema Neurological: oriented to time, person and place    __    Medications added today by the physician:    IMPRESSIONS:  1. Hypertension  a. intolerances w/ toprol XL and lisinopril.  2. Previous palpitations with unremarkable Body Guardian monitor 05/2017  3. Hyperlipidemia.    4. Obesity.   5. Structurally normal heart by echocardiogram in 2016.   6. Diabetes.     RECOMMENDATIONS:  1. Start hydralazine 25 twice daily. She can bump this to 50 twice daily after 1 week.  2. BP check in 2 weeks  3. Ideal  Protein weight loss  4. APP follow-up in 6 weeks.    Bary Richard, MD      cc: Elmore Guise MD  Chevis Pretty MD

## 2021-01-19 NOTE — Progress Notes (Signed)
[facility]   [phone] [facility]. [address] [place], [place] [address]           Emily Rowe    Date of Visit:  11/18/2019  Date of Birth: [birth year]  Age: 70 yrs.   Medical Record Number: [id number]  __  CURRENT DIAGNOSES     1. Pure hypercholesterolemia, unspecified,  E78.00  2. Essential (primary) hypertension, I10  3. DM Type II, Uncomplicated,Unspecified, 250.00  __  ALLERGIES     Chlorthalidone, Headache  Codeine, Feeling faint  Fish Oil, Intolerance-unknown  Flaxseed, Intolerance-unknown  Gabapentin, Intolerance-dizziness  Gabapentin, Irregular heart rate  Gabapentin, Rash  Glucosamine, Intolerance-unknown   Latex, Intolerance-unknown  Lyrica, Intolerance-unknown  nut, Intolerance-unknown  Simvastatin, Intolerance-unknown  __  MEDICATIONS      1. Tradjenta 5 mg tablet, 1 po QD  2. rosuvastatin 20 mg tablet, 1 po QD  3. glipizide 10 mg tablet, 2 po qd  4. lorazepam 0.5 mg tablet, 1/2 po qam  5. amitriptyline 10 mg tablet, 1 po qd  6. Lantus Solostar U-100 Insulin  100 unit/mL (3 mL) subcutaneous pen, as directed  7. losartan 100 mg tablet, 1 po qhs  8. verapamil ER (SR) 120 mg tablet,extended release, 1 po bid  9. hydralazine 25 mg tablet, 1 po bid prn SBP 150 or greater  __   CHIEF COMPLAINT/REASON FOR VISIT  Followup of Essential (primary) hypertension  __  HISTORY OF PRESENT ILLNESS   Emily Rowe is being evaluated today for a 6 week follow up. She has a history of history of diabetes, dyslipidemia, hypertension, and CVA in 2012.    Since we saw her last, she had a colonoscopy which unfortunately ended with a perforation so she  was hospitalized. She says she was also diagnosed with anemia. Her blood pressures have been overall quite well controlled after she started taking both her losartan 50 mg tablets at nighttime and taking a second verapamil 120 mg tab later in the day.  She has only had 2 episodes of systolic blood pressure in the 200s and both times came down to the 130s-140s after her  second verapamil. Her home wrist blood pressure cuff is significantly higher than our reading the office. We had a reading of 138/82  and her home blood pressure monitor is showing systolic in the 170s. She states that she has frequently been seeing her PCP due to her anemia and that her blood pressure has always been well controlled when she goes in. She denies any chest discomfort,  edema, or palpitations. She states she has shortness of breath when she is feeling claustrophobic but not with exertion unless her allergies are working up. She also states that she has chronically had lightheadedness and dizziness since her stroke and  that usually will be better if she eats foods high in potassium. She is still having issues with constipation and is scheduled for repeat colonoscopy in a month.    __  PAST HISTORY      Past Medical Illnesses: Stroke, DM, HLD, HTN, Neuropathy;  Past Cardiac Illnesses : Palpitations; Infectious Diseases: No previous history of significant infectious diseases.; Surgical  Procedures: Tonsillectomy 1982, Hysterectomy 2006, polyps/1997 and 2008; Cardiology Procedures-Noninvasive : Echocardiogram December 2016, Body Guardian 2018; Left Ventricular Ejection Fraction: LVEF of 65% documented via echocardiogram on 09/29/2015   ___  FAMILY HISTORY  Father -- Stroke  MaternalGrandparent --  Diabetes mellitus (GM)  Mother -- Myocardial infarction at greater than 60 (died from MI), Diabetes  mellitus    __  CARDIAC RISK FACTORS      Tobacco Abuse: has never used tobacco; Family History of Heart Disease: positive;  Hyperlipidemia: positive; Hypertension: positive;   Diabetes Mellitus: positive; Prior History of Heart Disease: negative;  Obesity: BMI25 (Over weight); Sedentary Life Style:negative;  [id number]; Menopausal:positive  __   SOCIAL HISTORY    Alcohol Use: Does not use alcohol;  Smoking: never smoked; Never smoker ([id number]); Diet: Regular diet without modifications and Caffeine  use-1-2  per day; Exercise: Exercises occasionally;   __  PHYSICAL EXAMINATION     Vital Signs:  Blood Pressure:  138/82 Sitting, Left arm, large cuff    Weight:  204.00 lbs.  Height: 66.00"  BMI: 32.92    Pulse: 72/min. Apical       Constitutional: Well developed, well nourished, in no acute distress  Skin: warm and dry to touch, no apparent skin lesions or masses noted Head: normocephalic  Eyes: conjunctivae and lids normal, Funduscopic exam and visual fields not performed ENT: wearing face  mask Neck: no JVD, no bruits Chest: clear to auscultation  bilaterally, normal respiratory excursion, no intercostal retraction, no use of accessory muscles Cardiac: Regular rhythm, S1 normal, S2 normal, No S3  or S4, no murmurs Peripheral Pulses: pulses full and equal in all extremities Extremities/Back : No clubbing, cyanosis or edema Neurological: oriented to time, person and place   __    Medications added today by the physician:  hydralazine  25 mg tablet, 1 po bid prn SBP 150 or greater, 30  losartan 100 mg tablet, 1 po qhs, 90  verapamil ER (SR) 120 mg tablet,extended release, 1 po bid, 180    IMPRESSIONS:  1. Hypertension, currently controlled.  a. Intolerances w/  toprol XL, lisinopril, and chlorthalidone.  2. Previous palpitations with unremarkable Body Guardian monitor 05/2017  3. Hyperlipidemia.   4. Obesity.   5. Structurally normal heart by echocardiogram in 2016.   6. Diabetes.   7.  CVA in February 2012.  8. Patient reports she had sleep study in past and was not recommended for CPAP.    RECOMMENDATIONS:  1. Continue current cardiac medications. If her blood pressures are not controlled with the second dose   of  verapamil, she is aware to take hydralazine as-needed.  2. She states she is due for routine lipids with her PCP. I requested she send [place] a copy.  3. Continue to monitor blood pressures.  4. Follow up with Dr. Delbert Phenix in 4 months, sooner  if needed.    This note was generated by the [facility] EMR system/Dragon  speech recognition and may contain errors or omissions not intended by the user. Grammatical errors, random word insertions, deletions, pronoun errors, and incomplete sentences  are occasional consequences of this technology due to software limitations. Not all errors are caught or corrected. If there are questions or concerns about the content of this note or information contained within the body of this dictation, they should  be addressed directly with the author for clarification.     Heather Roberts, FNP-C      cc: Elmore Guise MD  Chevis Pretty MD    ____________________________  Christianne Dolin  IO:[id number] Education ICD-10: I10  MedlinePlus Connect results for ICD-10 I10  Return Visit 15 MIN 4 months

## 2021-01-19 NOTE — Progress Notes (Signed)
[facility]   [phone] [facility] [address] [place], [place] [address]           Emily Rowe    Date of Visit:  10/08/2019  Date of Birth: [birth year]  Age: 70 yrs.   Medical Record Number: [id number]  __  CURRENT DIAGNOSES     1. Pure hypercholesterolemia, unspecified,  E78.00  2. Essential (primary) hypertension, I10  3. DM Type II, Uncomplicated,Unspecified, 250.00  __  ALLERGIES     Codeine, Feeling faint  Fish Oil, Intolerance-unknown  Flaxseed, Intolerance-unknown  Gabapentin, Intolerance-dizziness  Gabapentin, Irregular heart rate  Gabapentin, Rash  Glucosamine, Intolerance-unknown  Latex, Intolerance-unknown   Lyrica, Intolerance-unknown  nut, Intolerance-unknown  Simvastatin, Intolerance-unknown  __  MEDICATIONS      1. Tradjenta 5 mg tablet, 1 po QD  2. rosuvastatin 20 mg tablet, 1 po QD  3. glipizide 10 mg tablet, 2 po qd  4. chlorthalidone 25 mg tablet, 1 po prn, (takes as a combo pill from [place] DIUPRESS)  5. lorazepam 0.5 mg tablet,  1/2 po qam  6. amitriptyline 10 mg tablet, 1 po qd  7. Lantus Solostar U-100 Insulin 100 unit/mL (3 mL) subcutaneous pen, as directed  8. hydralazine 25 mg tablet, 1 po bid  9. Losartan 50 Mg Tablet, 1 po bid  10. Verapamil Er 120  Mg 24 Hr Capsule,extended Release, 1 po bid  11. chlorthalidone 25 mg tablet, 1 po qam  __  CHIEF COMPLAINT/REASON FOR VISIT  Followup of Essential  (primary) hypertension  __  HISTORY OF PRESENT ILLNESS  70 year old female to she reports blood pressures been elevated she denies a particular  chest pain she does state that she was tested for sleep apnea and was not recommended to use any CPAP in the past. She denies any leg edema.    Emily Rowe is being evaluated today as a 1-week follow-up. She has a history of history of diabetes,  dyslipidemia and hypertension. Last week she came into the office for blood pressure check after she was started on hydralazine 25 mg twice daily by Dr. Delbert Rowe at her last office visit. She had  apparently stopped taking her losartan and verapamil when  she started hydralazine so she was recommended to resume those medications and have hydralazine be taken on top. Her blood pressures well controlled in the office today but she states that last night it was up to 180s systolic and this morning was 725/366.  Last night she did have an episode where she was very hot and had to wrap herself in a wet blankets she had chilled in the freezer. She has been having episodes of feeling very hot and then very cold and is following up with her PCP regarding this. She  is also scheduled for an endoscopy and colonoscopy tomorrow. We went over her medication list, she is no longer taking chlorthalidone as the when she was taking was from [place] and her primary care told her to discontinue this.    __   PAST HISTORY     Past Medical Illnesses: Stroke, DM, HLD, HTN, Neuropathy;   Past Cardiac Illnesses: Palpitations; Infectious Diseases : No previous history of significant infectious diseases.; Surgical Procedures: Tonsillectomy 1982, Hysterectomy 2006, polyps/1997 and 2008;  Cardiology Procedures-Noninvasive: Echocardiogram December 2016, Body Guardian 2018; Left Ventricular Ejection Fraction : LVEF of 65% documented via echocardiogram on 09/29/2015  ___  FAMILY HISTORY  Father --  Stroke  MaternalGrandparent -- Diabetes mellitus (GM)  Mother --  Myocardial infarction at greater than 60 (died from MI), Diabetes mellitus    __  CARDIAC RISK FACTORS     Tobacco Abuse: has never  used tobacco; Family History of Heart Disease: positive; Hyperlipidemia : positive; Hypertension: positive;  Diabetes Mellitus : positive; Prior History of Heart Disease: negative; Obesity : BMI25 (Over weight); Sedentary Conservation officer, historic buildings; Age :positive; Menopausal:positive  __  SOCIAL  HISTORY    Alcohol Use: Does not use alcohol;  Smoking: never smoked; Never smoker ([id number]); Diet: Regular diet without modifications and Caffeine  use-1-2 per  day; Exercise: Exercises occasionally;   __  PHYSICAL EXAMINATION     Vital Signs:  Blood Pressure:  135/78 Sitting, Left arm, large cuff    Weight:  200.00 lbs.  Height: 66.00"  BMI: 32.28    Pulse: 80/min. Apical       Constitutional: Well developed, well nourished, in no acute distress  Skin: warm and dry to touch, no apparent skin lesions or masses noted Head: normocephalic  Eyes: conjunctivae and lids normal, Funduscopic exam and visual fields not performed ENT: wearing face  mask Neck: no JVD, no bruits Chest: clear to auscultation  bilaterally Cardiac: Regular rhythm, S1 normal, S2 normal, No S3 or S4, no murmurs Peripheral Pulses : pulses full and equal in all extremities Extremities/Back: No clubbing, cyanosis or edema Neurological : oriented to time, person and place   __    Medications added today by the physician:  chlorthalidone 25 mg tablet, 1 po qam, 30    IMPRESSIONS:  1. Hypertension   a. intolerances w/ toprol XL and lisinopril.  2. Previous  palpitations with unremarkable Body Guardian monitor 05/2017  3. Hyperlipidemia.   4. Obesity.   5. Structurally normal heart by echocardiogram in 2016.   6. Diabetes.   7. CVA in February 2012.    RECOMMENDATIONS:  1. I  will send in a prescription for chlorthalidone 25 mg once daily. She had been on this previously   but her prescription was for the [place] brand.  2. BMP in 1 week to assess her kidney function electrolytes.  3. Increase hydralazine to  50 mg 3 times a day.  4. She will continue to monitor home blood pressures.  5. Follow-up in 2 weeks to reassess her blood pressures, sooner if needed.    This note was generated by the [facility] EMR system/Dragon speech recognition and may  contain errors or omissions not intended by the user. Grammatical errors, random word insertions, deletions, pronoun errors, and incomplete sentences are occasional consequences of this technology due to software limitations. Not all errors are caught  or corrected. If there  are questions or concerns about the content of this note or information contained within the body of this dictation, they should be addressed directly with the author for clarification.      Heather Roberts, FNP-C       cc: Elmore Guise MD  Chevis Pretty MD    ____________________________  TODAYS ORDERS  Diet mgmt edu, guidance and counseling TODAY  Basic Metabolic Panel 1 week  Return Visit with NP/PA 2 weeks

## 2021-01-19 NOTE — Progress Notes (Signed)
[facility] OFFICE  [address] [place], [place] [address]     Danton Clap, Byrd Hesselbach    Date of Visit:  09/09/2015  Date of Birth: 1950-10-30  Age: 70 yrs.   Medical Record Number: [id number]  Referring Physician: Dorene Grebe MD, PARMINDER S.  __   CURRENT DIAGNOSES     1. DM Type II, Uncomplicated,Unspecified, 250.00  2. Pure hypercholesterolemia, E78.0  3. Essential (primary) hypertension, I10  4. Encounter for preprocedural cardiovascular  examination, Z01.810  5. Syncope and collapse, R55  __  ALLERGIES    Codeine, Feeling faint   __  MEDICATIONS     1. glipizide 10 mg tablet, 1 po qd  2. lisinopril 40 mg tablet, 1 po qhs  3. simvastatin 40 mg tablet, 1 po qhs   4. aspirin 81 mg chewable tablet, 1 po qd  5. Prevacid 15 mg capsule,delayed release, PRN  6. hydrochlorothiazide 25 mg tablet, 1 po qd  __  CHIEF COMPLAINT/REASON FOR VISIT   Followup of Essential (primary) hypertension  __  HISTORY OF PRESENT ILLNESS  [name] had a syncopal episode two weeks ago. This  occurred in the context of her having a lot of neck pain. She did not have any preceding chest pain or palpitations. She has never had syncope before. She was told that she had dehydration although she is adamant that she drinks lots of fluids.    __  PAST HISTORY     Past Medical Illnesses:  No previous history of significant medical illnesses.;  Past Cardiac Illnesses: No previous history of  cardiac disease.; Infectious Diseases: No previous history of significant infectious diseases.; Surgical  Procedures: Tonsillectomy 1982, Hysterectomy 2006, polyps/1997 and 2008  __  CARDIAC RISK FACTORS      Tobacco Abuse: has never used tobacco; Family History of Heart Disease: positive;  Hyperlipidemia: positive; Hypertension: positive;   Diabetes Mellitus: positive; Prior History of Heart Disease: negative;  Obesity: BMI25 (Over weight); Sedentary Life Style:negative;  JWJ:XBJYNWGN; Menopausal:positive  __  SOCIAL  HISTORY    Alcohol  Use: Does not use alcohol;  Smoking: never smoked; Never smoker ([id number]); Diet: Regular diet without modifications and Caffeine  use-1-2 per day; Exercise: Exercises occasionally;   __  PHYSICAL EXAMINATION     Vital Signs:  Blood Pressure:  116/72 Sitting, Right arm, large cuff    Weight:  203.60 lbs.  Height: 66"  BMI: 33    Pulse: 66/min. Apical Regular       Constitutional: Cooperative, alert and oriented,well developed,  well nourished, in no acute distress.  Head: normal female hair pattern  Eyes: conjunctivae and lids normal ENT: No pallor or cyanosis  Neck: carotid pulses are full and equal bilaterally, no JVD, no bruits Chest: clear to auscultation  bilaterally, normal respiratory effort Cardiac: Regular rhythm, S1 normal, S2 normal, No S3 or S4, Apical impulse not displaced, no murmurs, gallops  or rubs detected. Abdomen: abdomen normal, abdomen soft, non-tender, no bruits Peripheral Pulses : bilateral radial pulse(s) 2+, bilateral posterior tibial pulse(s) 2+ Extremities/Back: No clubbing, cyanosis or edema  Neurological: No gross motor or sensory deficits noted, affect appropriate, oriented to time, person and place.   __    Medications added today  by the physician:    IMPRESSIONS:    1. Syncope, likely vasovagal.   2. Diabetes.    3. Hypertension.   4. Hypercholesterolemia.   5. Chronic neuropathic pain.   6. Prior stroke.     RECOMMENDATIONS:  1. Echocardiogram for evaluation of any structural heart disease because of her syncope.   2. Follow up as  needed if her tests are unremarkable.   3. She will continue following up with her neurologist and her primary care doctor for treatment of her   neuropathy.     Aundria Mems, MD, [facility]    JSL/tutlc     cc: Chevis Pretty  MD  Elmore Guise MD    rw  _______________________  TODAYS ORDERS  2D, color flow, doppler First Available  Diet mgmt edu, guidance and counseling TODAY

## 2021-01-19 NOTE — Progress Notes (Signed)
[facility] OFFICE  [phone] [facility] [place], [place] [address]     Emily Rowe    Date of Visit:  06/06/2017  Date of Birth: February 13, 1951  Age: 70 yrs.   Medical Record Number: [id number]  Referring Physician: Delman Cheadle MD, [facility]  __   CURRENT DIAGNOSES     1. DM Type II, Uncomplicated,Unspecified, 250.00  2. Pure hypercholesterolemia, unspecified, E78.00  3. Essential (primary) hypertension, I10  4. Syncope and collapse,  R55  5. Encounter for preprocedural cardiovascular examination, Z01.810  __  ALLERGIES     Codeine, Feeling faint  Fish Oil, Intolerance-unknown  Flaxseed, Intolerance-unknown  Gabapentin, Intolerance-dizziness  Gabapentin, Irregular heart rate  Gabapentin, Rash  Glucosamine, Intolerance-unknown  Latex, Intolerance-unknown   nut, Intolerance-unknown  Simvastatin, Intolerance-unknown  __  MEDICATIONS     1. amiloride  5 mg tablet, 5mg  QD (A PART OF DIUPRESS)  2. aspirin 81 mg chewable tablet, 1 po qd  3. chlorthalidone 25 mg tablet, 25mg  QD. (A PART OF DIUPRESS)  4. glipizide 10 mg tablet, 1 po qd  5. hydrochlorothiazide 25 mg tablet, 1 po qd  6.  lisinopril 40 mg tablet, 1 po qhs  7. Prevacid 15 mg capsule,delayed release, PRN  8. simvastatin 40 mg tablet, 1 po qhs  __  CHIEF COMPLAINT/REASON FOR VISIT   Followup of palpitation  __  HISTORY OF PRESENT ILLNESS  The patient is a pleasant, 70 year old female who has a history of diabetes, hypertension,  and dyslipidemia. She was well until a few weeks ago when she had symptoms of palpitations prompting her to go to the emergency room. She noted her heart rate to be in the 150s. While in the emergency room, her symptoms had resolved. Her heart rate was  80 on admission and her ECG there was sinus rhythm. She was also noted to have significantly elevated blood pressures. She reports a significant amount of stress at work and also at home and believes that a lot of her symptoms are due to underlying stress.  In 2016,  she had an echocardiogram with [place] which was essentially normal. She denies any exertional chest pain, but does have palpitations off and on. She has a remote history of a stroke in the past.   __   PAST HISTORY     Past Medical Illnesses: Stroke, DM, HLD, HTN, Neuropathy;   Past Cardiac Illnesses: Palpitations; Infectious Diseases : No previous history of significant infectious diseases.; Surgical Procedures: Tonsillectomy 1982, Hysterectomy 2006, polyps/1997 and 2008;  Cardiology Procedures-Noninvasive: Echocardiogram December 2016; Left Ventricular Ejection Fraction: LVEF  of 65% documented via echocardiogram on 09/29/2015  ___  FAMILY HISTORY  Father --  Stroke  MaternalGrandparent -- Diabetes mellitus (GM)  Mother --  Myocardial infarction at greater than 60 (died from MI), Diabetes mellitus    __  CARDIAC RISK FACTORS     Tobacco Abuse: has never  used tobacco; Family History of Heart Disease: positive; Hyperlipidemia : positive; Hypertension: positive;  Diabetes Mellitus : positive; Prior History of Heart Disease: negative; Obesity : BMI25 (Over weight); Sedentary Conservation officer, historic buildings; Age :positive; Menopausal:positive  __  SOCIAL  HISTORY    Alcohol Use: Does not use alcohol;  Smoking: never smoked; Never smoker ([id number]); Diet: Regular diet without modifications and Caffeine  use-1-2 per day; Exercise: Exercises occasionally;   __  PHYSICAL EXAMINATION     Vital Signs:  Blood Pressure:  116/78 Sitting, Left arm, large cuff    Weight:  208.00 lbs.  Height: 66.00"  BMI: 33    Pulse: 65/min. Apical       Constitutional: Cooperative, alert and oriented,well developed,  well nourished, in no acute distress.  ENT: No pallor or cyanosis  Neck: no JVD, no bruits Chest: clear to auscultation bilaterally, normal respiratory effort  Cardiac: Regular rhythm, S1 normal, S2 normal, No S3 or S4, no murmurs Abdomen: abdomen normal, abdomen  soft, non-tender, no bruits Peripheral Pulses: bilateral radial pulse(s) 2+,  bilateral posterior tibial pulse(s) 2+  Extremities/Back: No clubbing, cyanosis or edema   __    Medications added today by the physician:     ECG:  Normal sinus rhythm, nonspecific T wave flattening, unchanged from prior ECG in 2016.     IMPRESSIONS:  1. Symptoms of palpitations with a heart rate of 158 by her home blood pressure monitor. In the   emergency room her heart  rate was in the 80s and ECG was normal sinus rhythm.  2. History of remote stroke.  3. Diabetes.   4. Hypertension.   5. Dyslipidemia.   6. Significant amount of stress at home.   7. Echocardiogram from December of 2016 showing  mild left ventricular hypertrophy, ejection   fraction 65%.     RECOMMENDATIONS:  1. Given her symptoms of intermittent palpitations and prior history of stroke it would be prudent   to place a Holter monitor to rule out any arrhythmias  such as atrial fibrillation or flutter, which   would change her medical therapy.   2. If her BodyGuardian monitor is negative she can see [place] back in one to two years for routine   follow up.     Thank you for allowing me to participate  in the care of your patient.     Johannes Everage E. Delbert Phenix, M.D.     AEC/tubks    cc: Elmore Guise MD  Chevis Pretty MD     el   ____________________________  TODAYS ORDERS  Diet mgmt edu, guidance and counseling TODAY  Patient Electronic Access Today  12  Lead ECG Today  Mobile Cardiac Telemetry - 30 day First Available  Return Visit 30 MIN 1 year

## 2021-01-19 NOTE — Progress Notes (Signed)
Have you seen any specialists/other providers since your last visit with [place]?    No    Arm preference verified?   Yes    The patient is due for shingles vaccine and Medicare Wellness, AD        Date: 01/19/2021 2:47 PM   Patient ID: Emily Rowe is a 70 y.o. female.    Chief Complaint:  Chief Complaint   Patient presents with    Anxiety       HPI:  70 year old female with diabetes, stroke, hypertension, recent hospitalization status post colonoscopy for perforation in need med for her anxiety    Gets anxious and sometime worsen and gets panic attack s/s  She needs med to relax  Was seeing psychologist and then psychiatrist- Dr Melonie Florida- lifestent  given Pristiq - stopped as causing BP High  Now attending counseling   Last episdoe was 1 week ago  Went to bed and the  Develop weakness and got stressed what if she gets not able to move- so got up,put light in house and tried to relax  Took 45 minutes to clear this episode  Lorazepam helps  Last refill lorazepam 0.5 mg #20 on 08/2019  On 17th travelling to [place]- she uses lorazepam when she travels          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  Dr [name] sent me a note:  Edrick Oh, MD  Rebecca Eaton, MD  1. Virtual colonoscopy suboptimal distension but negative. Probably would do a yearly FIT for f/u on this pt given hx   2. 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 Would repeat MR in 2 y   Patient had a repeat MRI 12/06/2020-cyst is just about the same and there is no new abnormality      The other 14 point review systems are negative except those in HPI.    Current Medications:  Outpatient Medications Marked as Taking for the 01/19/21 encounter (Office Visit) with Rebecca Eaton, MD   Medication Sig Dispense Refill    aspirin 81 MG chewable tablet Chew 81 mg by mouth daily TAKES SOME DAYS        evolocumab (REPATHA) 140 MG/ML subcutaneous auto-injector Inject 1 mL (140 mg total) into the skin  every 14 (fourteen) days 6 pen 3    glipiZIDE (GLUCOTROL) 10 MG 24 hr tablet Take 1 tablet (10 mg total) by mouth daily 30 tablet 0    glucose blood test strip Check sugar three times a day, ICD 10 E11.9 100 each 10    insulin glargine (LANTUS SOLOSTAR) 100 UNIT/ML injection pen Inject 10 units into skin daily at bedtime ONLY AS NEEDED IF BLOOD SUGAR >OR = TO 170  LAST TAKEN 07/2020 15 mL 1    losartan (COZAAR) 50 MG tablet TAKE 1 TABLET(50 MG) BY MOUTH TWICE DAILY 180 tablet 2    pantoprazole (PROTONIX) 40 MG tablet Take 40 mg by mouth daily      verapamil ER (CALAN-SR) 120 MG CR tablet Take 1 tablet (120 mg total) by mouth every morning 90 tablet 1       Past Medical History:  Past Medical History:   Diagnosis Date    Anxiety     Asthma     allergic reaction to mold, not since 20 years, HAS WHEEZING IN COLD WEATHER. NO INHALERS    Bowel perforation     COLONOSCOPY 09/2019  Depression     Diabetes     Difficulty walking     WEAK RIGHT LEG AFTER CVA 2021    Dysphagia     BIG PILLS ONLY     Gastroesophageal reflux disease     PREVACID OTC PRN     Hyperlipemia     Hypertensive disorder     Hypokalemia     CMP REQ PCP 09/14/20    Neuropathy     NUMBNESS RIGHT UE AND LE     Post-operative nausea and vomiting     ALWAYS WITH ANESTHESIA     Sleep apnea 2017    NO CPAP NEEDED PER PT     Stroke 2012     ARM, LEG WEAK. WALKS WITH CANE, WALKER    TIA (transient ischemic attack)     MULTIPLE BEFORE CVA 2012    Type 2 diabetes mellitus, controlled 2021    MANAGED BY DR Binnie Rail RAO. AM FSB 117-140. TAKES LANTUS PRN IN AM. HAIC 7.9       Past Surgical History:  Past Surgical History:   Procedure Laterality Date    BREAST BIOPSY Left     benign lump biopsy    COLONOSCOPY      X 2    COLONOSCOPY N/A 09/17/2020    Procedure: COLONOSCOPY;  Surgeon: Edrick Oh, MD;  Location: [facility];  Service: Gastroenterology;  Laterality: N/A;    EGD N/A 10/28/2020    Procedure: EGD w/ bx's;  Surgeon:  Philbert Riser, MD;  Location: [facility];  Service: Gastroenterology;  Laterality: N/A;    HYSTERECTOMY      2005    OOPHORECTOMY Bilateral     2005    REDUCTION MAMMAPLASTY      TONSILLECTOMY      urinary bladder      BLADDER LIFT        Family History:  Family History   Problem Relation Age of Onset    Diabetes Mother     Heart attack Mother     Diabetes Maternal Grandmother     Stroke Maternal Grandmother     Breast cancer Daughter 30    Stroke Maternal Grandfather        Social History:   reports that she has never smoked. She has never used smokeless tobacco.    Allergies:  Allergies   Allergen Reactions    Codeine Nausea And Vomiting    Crestor [Rosuvastatin] Rash     D/c due to severe rash and higher blood sugars    Glucosamine Hives and Nausea And Vomiting    Latex Rash    Tree Nuts Hives     ESPECIALLY ALMONDS     Eggs Or Egg-Derived Products      Patient had an allergy test and stated that she was allergic to eggs    Gabapentin      Dizziness and rash, irregular heart rate    Invokana [Canagliflozin]     Levemir [Insulin Detemir] Nausea And Vomiting    Lyrica [Pregabalin]      PT STATES IT MAKES HER NEUROPATHY WORSE STATES SHE FEELS LIKE SHE IS ON FIRE    Metformin      diarrhea    Sucralfate Nausea And Vomiting     BODY ACHES, FEVER    Evaristo Bury [Insulin Degludec]     Victoza [Liraglutide] Nausea And Vomiting    Flaxseed [Linseed Oil] Rash    Omega 3 [Fish Oil] Rash  Peanut-Containing Drug Products Rash        The following sections were reviewed this encounter by the provider:   Tobacco   Allergies   Meds   Problems   Med Hx   Surg Hx   Fam Hx            VITALS:  Vitals:    01/19/21 1428   BP: 161/86   BP Site: Left arm   Patient Position: Sitting   Cuff Size: Large   Pulse: 82   Resp: 16   Temp: 98 F (36.7 C)   TempSrc: Oral   SpO2: 98%   Weight: 95.7 kg (211 lb)        ROS:  General/Constitutional:   Denies Chills. Denies Fatigue. Denies Fever.    Ophthalmologic:   Denies Blurred vision. Denies Eye Pain.   ENT:   Denies Nasal Discharge. Denies Ear pain. Denies Sinus pain.   Endocrine:   Denies Polydipsia. Denies Polyuria.   Respiratory:   Denies Cough. Denies Orthopnea. Denies Shortness of breath. Denies Wheezing.   Cardiovascular:   Denies Chest pain. Denies Chest pain with exertion. Denies Leg Claudication. Denies      Palpitations. Denies Swelling in hands/feet.   Gastrointestinal:   Denies Abdominal pain. Denies Blood in stool. Denies Constipation. Denies Diarrhea.      Denies Heartburn. Denies Nausea. Denies Vomiting.   Genitourinary:   Denies Blood in urine. Denies Frequent urination. Denies Painful urination.   Musculoskeletal:   + Leg cramps. + Muscle aches.   Skin:   Denies Skin lesion(s).   Neurologic:   Denies Dizziness. +Gait abnormality. Denies Headache. +  Tingling/Numbness.     Physical Exam:  Alert and oriented. Affect is normal and appropriate. Clear thought process with normal reasoning and conversational tone and logic. No psychomotor retardation. Not in any acute distress  Skin: no rash or abnormal lesions  Eyes - normal eye movements.  Pupils equal and reactive to light  Eyelid normal on exam  Ears - TM clear, external ear normal  Nose - no acute changes  Throat - Oral mucosa moist and pink, no abnormality noted  Neck- No thyroid enlargement noted, no palpable cervical lymph nodes  HEART: S1S2 heard, regular rate and rhythm  LUNGS: good air exchange, no crackles or ronchi or wheezing  ABDOMEN: Positive bowel sounds, no hepatospleenomegaly appreciated, no tenderness   Back: Normal curvature, no tenderness.   Extremities: FROM, no deformities, no edema, no erythema.   No knee effusion.   mild tendernesson the joint line.  Neuro: Physiological, speech better than before.   Right side weakness.   Restricted and only able to abduct <20%, Right leg weakness.   Strength RUE and RLE 3/5.       Psych-good eye contact  Train of thought  normal  Judgment normal    Lab Results   Component Value Date    WBC 7.99 09/11/2020    HGB 12.5 09/11/2020    HCT 40.2 09/11/2020    PLT 147 09/11/2020    CHOL 193 12/30/2020    TRIG 139 12/30/2020    HDL 45 12/30/2020    LDL 120 (H) 12/30/2020    ALT 17 12/30/2020    AST 18 12/30/2020    NA 140 12/30/2020    K 4.6 12/30/2020    CL 100 12/30/2020    CREAT 0.9 12/30/2020    BUN 15.0 12/30/2020    CO2 30 (H) 12/30/2020    TSH 2.63 12/30/2020  INR 1.0 05/10/2019    GLU 170 (H) 12/30/2020    HGBA1C 8.6 (H) 12/30/2020     Assessment/Plan:       1. Diabetic peripheral neuropathy    2. Anxiety  - LORazepam (ATIVAN) 0.5 MG tablet; Take 0.5 tablets (0.25 mg total) by mouth nightly as needed for Anxiety  Dispense: 20 tablet; Refill: 0    Relaxing techniques, deep breathing, warm baths, warm tea, distraction exercises reviewed with patient, yoga, message therapy, accupuncture, speaking with someone, family/friends/professional.    Consider medication RX discussed  Patient does not want to start a long-acting medication as she has had side effects to a few of them.  PMP reviewed  Should signed controlled substance contract agreement  Benzodiazepine medications carry risks for tolerance, dependence, addiction, withdrawal, high risk of falls, dizziness, nausea, sedation,: when used in overdose or with alcohol can lead to severe respiratory depression and death. Patient understands risks of medication.        Follow-up-patient wants to return after she returns back from [place]      This note was generated by the Epic EMR system/ Dragon speech recognition and may contain inherent errors or omissions not intended by the user. Grammatical errors, random word insertions, deletions, pronoun errors and incomplete sentences are occasional consequences of this technology due to software limitations. Not all errors are caught or corrected. If there are questions or concerns about the content of this note or information contained within  the body of this dictation they should be addressed directly with the author for clarification      Rebecca Eaton, MD

## 2021-01-19 NOTE — Progress Notes (Signed)
[facility] OFFICE  [address] [place], [place] [address]     Emily Rowe    Date of Visit:  07/18/2018  Date of Birth: 1951-01-22  Age: 70 yrs.   Medical Record Number: [id number]  __  CURRENT DIAGNOSES     1. Pure hypercholesterolemia, unspecified,  E78.00  2. Essential (primary) hypertension, I10  3. DM Type II, Uncomplicated,Unspecified, 250.00  __  ALLERGIES     Codeine, Feeling faint  Fish Oil, Intolerance-unknown  Flaxseed, Intolerance-unknown  Gabapentin, Intolerance-dizziness  Gabapentin, Irregular heart rate  Gabapentin, Rash  Glucosamine, Intolerance-unknown  Latex, Intolerance-unknown   nut, Intolerance-unknown  Simvastatin, Intolerance-unknown  __  MEDICATIONS     1. amlodipine  10 mg tablet, take 1 PO QAM  2. chlorthalidone 25 mg tablet, 1 po prn, (takes as a combo pill from [place] DIUPRESS)  3. glipizide 10 mg tablet, 2 po qd  4. rosuvastatin 20 mg tablet, 1 po QD  5. Emily Rowe 5 mg tablet, 1 po QD  6. Emily Bury  Rowe U-100 insulin 100 unit/mL (3 mL) subcutaneous pen, Take as Directed  __  CHIEF COMPLAINT/REASON FOR VISIT  Followup of Essential (primary)  hypertension  __  HISTORY OF PRESENT ILLNESS  Emily Rowe is a 70 year old female coming in today for hypertension follow up. At her last office  visit, Dr. Amado Nash started the patient on Emily Rowe and Emily Rowe. Unfortunately the patient did not tolerate these medications secondary to side effects of weakness and dizziness and she states that they did not control her blood pressure. Therefore she  went back on her blood pressure medications from [place] which include chlorthalidone and amiloride diuretic combo and one other medication that I am unfamiliar with. Her pressure actually looks fairly stable today. She states overall that she is feeling  well without any complaints of chest pain or shortness of breath.   __  PAST HISTORY      Past Medical Illnesses: Stroke, DM, HLD, HTN, Neuropathy;  Past Cardiac Illnesses :  Palpitations; Infectious Diseases: No previous history of significant infectious diseases.; Surgical  Procedures: Tonsillectomy 1982, Hysterectomy 2006, polyps/1997 and 2008; Cardiology Procedures-Noninvasive : Echocardiogram December 2016, Body Guardian 2018; Left Ventricular Ejection Fraction: LVEF of 65% documented via echocardiogram on 09/29/2015   ___  FAMILY HISTORY  Father -- Stroke   MaternalGrandparent -- Diabetes mellitus (GM)  Mother -- Myocardial infarction at greater than 60 (died  from MI), Diabetes mellitus    __  CARDIAC RISK FACTORS     Tobacco Abuse : has never used tobacco; Family History of Heart Disease: positive; Hyperlipidemia : positive; Hypertension: positive;  Diabetes Mellitus : positive; Prior History of Heart Disease: negative; Obesity : BMI25 (Over weight); Sedentary Conservation officer, historic buildings; Age :positive; Menopausal:positive  __  SOCIAL HISTORY     Alcohol Use: Does not use alcohol; Smoking: never smoked; Never smoker ([id number]);  Diet: Regular diet without modifications and Caffeine use-1-2 per day; Exercise: Exercises occasionally;    __  PHYSICAL EXAMINATION    Vital Signs:   Blood Pressure:  138/86 Sitting, Left arm, large cuff    Weight: 201.00 lbs.   Height: 66.00"  BMI: 32.44   Pulse:  84/min.       Constitutional: alert, oriented  Chest: clear to auscultation bilaterally Cardiac: Regular rhythm, S1 normal, S2 normal, No S3 or S4, no  murmurs Abdomen: abdomen soft, non-tender Extremities/Back : No clubbing, cyanosis or edema Neurological: oriented to time, person and place   __  Medications added today by the physician:  amlodipine 10 mg tablet, take 1 PO QAM, 30      IMPRESSIONS:   1. Hypertension - she has multiple medical intolerances including Emily Rowe and Emily Rowe.  2. Previous palpitations with unremarkable Body Guardian monitor last year. Interestingly  her ECG today did show a short burst of atrial tachycardia  which she is not particularly  symptomatic.   3.  Hyperlipidemia.   4. Obesity.   5. Structurally normal heart by echocardiogram in 2016.   6. Diabetes.     RECOMMENDATIONS:   Stop all of the blood pressure medications including Emily, Emily Rowe and her [place] medications. I will start her on 10 mg of Emily Rowe daily. She can take her [place] diuretics which include chlorthalidone and amiloride on an as-needed basis if  her blood pressure is greater than 160. I will have the patient maintain a blood pressure diary and follow up in one week to reassess. I told her to continue to take the amlodipine and not to stop it so we can see its effects after a week. She expressed  understanding.     Emily Rowe, [place]    AB/tuld    cc:   Emily Guise MD  Emily Pretty MD  ____________________________  Emily Rowe  Diet_mgmt_edu,_guidance_and_counseling TODAY  Return Visit  with NP/PA 1 week

## 2021-01-21 NOTE — Telephone Encounter (Signed)
Patient came in for a visit.

## 2021-01-22 ENCOUNTER — Encounter (INDEPENDENT_AMBULATORY_CARE_PROVIDER_SITE_OTHER): Payer: Self-pay

## 2021-01-23 ENCOUNTER — Encounter (INDEPENDENT_AMBULATORY_CARE_PROVIDER_SITE_OTHER): Payer: Self-pay

## 2021-02-04 ENCOUNTER — Ambulatory Visit (INDEPENDENT_AMBULATORY_CARE_PROVIDER_SITE_OTHER): Payer: Medicare Other | Admitting: Internal Medicine

## 2021-02-21 ENCOUNTER — Encounter (INDEPENDENT_AMBULATORY_CARE_PROVIDER_SITE_OTHER): Payer: Self-pay

## 2021-02-22 ENCOUNTER — Encounter (INDEPENDENT_AMBULATORY_CARE_PROVIDER_SITE_OTHER): Payer: Self-pay

## 2021-03-24 ENCOUNTER — Encounter (INDEPENDENT_AMBULATORY_CARE_PROVIDER_SITE_OTHER): Payer: Self-pay

## 2021-03-25 ENCOUNTER — Encounter (INDEPENDENT_AMBULATORY_CARE_PROVIDER_SITE_OTHER): Payer: Self-pay

## 2021-04-06 ENCOUNTER — Ambulatory Visit (INDEPENDENT_AMBULATORY_CARE_PROVIDER_SITE_OTHER): Payer: Medicare Other | Admitting: Family Medicine

## 2021-04-13 ENCOUNTER — Encounter (INDEPENDENT_AMBULATORY_CARE_PROVIDER_SITE_OTHER): Payer: Self-pay | Admitting: Family Medicine

## 2021-04-13 ENCOUNTER — Ambulatory Visit (INDEPENDENT_AMBULATORY_CARE_PROVIDER_SITE_OTHER): Payer: Medicare Other | Admitting: Family Medicine

## 2021-04-13 VITALS — BP 154/99 | HR 78 | Temp 97.6°F | Resp 16 | Wt 208.0 lb

## 2021-04-13 DIAGNOSIS — E1142 Type 2 diabetes mellitus with diabetic polyneuropathy: Secondary | ICD-10-CM

## 2021-04-13 DIAGNOSIS — I69359 Hemiplegia and hemiparesis following cerebral infarction affecting unspecified side: Secondary | ICD-10-CM

## 2021-04-13 DIAGNOSIS — I10 Essential (primary) hypertension: Secondary | ICD-10-CM

## 2021-04-13 DIAGNOSIS — E782 Mixed hyperlipidemia: Secondary | ICD-10-CM

## 2021-04-13 DIAGNOSIS — E1165 Type 2 diabetes mellitus with hyperglycemia: Secondary | ICD-10-CM

## 2021-04-13 LAB — LIPID PANEL
Cholesterol / HDL Ratio: 3.1 Index
Cholesterol: 132 mg/dL (ref 0–199)
HDL: 42 mg/dL (ref 40–9999)
LDL Calculated: 62 mg/dL (ref 0–99)
Triglycerides: 139 mg/dL (ref 34–149)
VLDL Calculated: 28 mg/dL (ref 10–40)

## 2021-04-13 LAB — COMPREHENSIVE METABOLIC PANEL
ALT: 14 U/L (ref 0–55)
AST (SGOT): 18 U/L (ref 5–34)
Albumin/Globulin Ratio: 1.2 (ref 0.9–2.2)
Albumin: 3.7 g/dL (ref 3.5–5.0)
Alkaline Phosphatase: 102 U/L (ref 37–117)
Anion Gap: 8 (ref 5.0–15.0)
BUN: 17 mg/dL (ref 7.0–19.0)
Bilirubin, Total: 0.4 mg/dL (ref 0.2–1.2)
CO2: 28 mEq/L (ref 21–29)
Calcium: 9.2 mg/dL (ref 8.5–10.5)
Chloride: 102 mEq/L (ref 100–111)
Creatinine: 0.9 mg/dL (ref 0.4–1.5)
Globulin: 3.2 g/dL (ref 2.0–3.6)
Glucose: 193 mg/dL — ABNORMAL HIGH (ref 70–100)
Potassium: 4.5 mEq/L (ref 3.5–5.1)
Protein, Total: 6.9 g/dL (ref 6.0–8.3)
Sodium: 138 mEq/L (ref 136–145)

## 2021-04-13 LAB — HEMOGLOBIN A1C
Average Estimated Glucose: 214.5 mg/dL
Hemoglobin A1C: 9.1 % — ABNORMAL HIGH (ref 4.6–5.9)

## 2021-04-13 LAB — HEMOLYSIS INDEX: Hemolysis Index: 4 Index (ref 0–24)

## 2021-04-13 LAB — MICROALBUMIN, RANDOM URINE
Urine Creatinine, Random: 60.3 mg/dL
Urine Microalbumin, Random: 6 ug/ml (ref 0.0–30.0)
Urine Microalbumin/Creatinine Ratio: 10 ug/mg (ref 0–30)

## 2021-04-13 LAB — GFR: EGFR: >60

## 2021-04-13 NOTE — Progress Notes (Signed)
Have you seen any specialists/other providers since your last visit with [place]?    No    Arm preference verified?   Yes    The patient is due for shingles vaccine and Medicare Wellness, AD        Date: 04/13/2021 9:44 AM   Patient ID: Emily Rowe is a 70 y.o. female.    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  Dr [name] sent me a note:  Edrick Oh, MD  Rebecca Eaton, MD  1. Virtual colonoscopy suboptimal distension but negative.  Probably would do a yearly FIT for f/u on this pt given hx   2. 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  Would repeat MR in 2 y   Patient had a repeat MRI 12/06/2020-cyst is just about the same and there is no new abnormality    DM-= managed by endo  Lab Results   Component Value Date    HGBA1C 8.6 (H) 12/30/2020       The other 14 point review systems are negative except those in HPI.    Current Medications:  Outpatient Medications Marked as Taking for the 04/13/21 encounter (Office Visit) with Rebecca Eaton, MD   Medication Sig Dispense Refill    aspirin 81 MG chewable tablet Chew 81 mg by mouth daily TAKES SOME DAYS        evolocumab (REPATHA) 140 MG/ML subcutaneous auto-injector Inject 1 mL (140 mg total) into the skin every 14 (fourteen) days 6 pen 3    glipiZIDE (GLUCOTROL) 10 MG 24 hr tablet Take 1 tablet (10 mg total) by mouth 2 (two) times daily 180 tablet 0    glucose blood test strip Check sugar three times a day, ICD  10 E11.9 100 each 10    insulin glargine (LANTUS SOLOSTAR) 100 UNIT/ML injection pen Inject 10 units into skin daily at bedtime ONLY AS NEEDED IF BLOOD SUGAR >OR = TO 170  LAST TAKEN 07/2020 15 mL 1    linaGLIPtin (Tradjenta) 5 MG Tab Take 1 tablet (5 mg total) by mouth daily 90 tablet 3    LORazepam (ATIVAN) 0.5 MG tablet Take 0.5 tablets (0.25 mg total) by mouth nightly as needed for Anxiety 20 tablet 0    losartan (COZAAR) 50 MG tablet TAKE 1 TABLET(50 MG) BY MOUTH TWICE DAILY 180 tablet 2    verapamil ER (CALAN-SR) 120 MG CR tablet Take 1 tablet (120 mg total) by mouth every morning 90 tablet 1       Past Medical History:  Past Medical History:   Diagnosis Date    Anxiety     Asthma     allergic reaction to mold, not since 20 years, HAS WHEEZING IN COLD WEATHER. NO INHALERS    Bowel perforation  COLONOSCOPY 09/2019    Depression     Diabetes     Difficulty walking     WEAK RIGHT LEG AFTER CVA 2021    Dysphagia     BIG PILLS ONLY     Gastroesophageal reflux disease     PREVACID OTC PRN     Hyperlipemia     Hypertensive disorder     Hypokalemia     CMP REQ PCP 09/14/20    Neuropathy     NUMBNESS RIGHT UE AND LE     Post-operative nausea and vomiting     ALWAYS WITH ANESTHESIA     Sleep apnea 2017    NO CPAP NEEDED PER PT     Stroke 2012     ARM, LEG WEAK. WALKS WITH CANE, WALKER    TIA (transient ischemic attack)     MULTIPLE BEFORE CVA 2012    Type 2 diabetes mellitus, controlled 2021    MANAGED BY DR Binnie Rail RAO. AM FSB 117-140. TAKES LANTUS PRN IN AM. HAIC 7.9       Past Surgical History:  Past Surgical History:   Procedure Laterality Date    BREAST BIOPSY Left     benign lump biopsy    COLONOSCOPY      X 2    COLONOSCOPY N/A 09/17/2020    Procedure: COLONOSCOPY;  Surgeon: Edrick Oh, MD;  Location: [facility];  Service: Gastroenterology;  Laterality: N/A;    EGD N/A 10/28/2020    Procedure: EGD w/ bx's;  Surgeon: Philbert Riser, MD;  Location: [facility];  Service: Gastroenterology;   Laterality: N/A;    HYSTERECTOMY      2005    OOPHORECTOMY Bilateral     2005    REDUCTION MAMMAPLASTY      TONSILLECTOMY      urinary bladder      BLADDER LIFT        Family History:  Family History   Problem Relation Age of Onset    Diabetes Mother     Heart attack Mother     Diabetes Maternal Grandmother     Stroke Maternal Grandmother     Breast cancer Daughter 30    Stroke Maternal Grandfather        Social History:   reports that she has never smoked. She has never used smokeless tobacco.    Allergies:  Allergies   Allergen Reactions    Codeine Nausea And Vomiting    Crestor [Rosuvastatin] Rash     D/c due to severe rash and higher blood sugars    Glucosamine Hives and Nausea And Vomiting    Latex Rash    Tree Nuts Hives     ESPECIALLY ALMONDS     Dairy [Milk-Related Compounds]     Eggs Or Egg-Derived Products      Patient had an allergy test and stated that she was allergic to eggs    Gabapentin      Dizziness and rash, irregular heart rate    Gluten Meal     Invokana [Canagliflozin]     Levemir [Insulin Detemir] Nausea And Vomiting    Lyrica [Pregabalin]      PT STATES IT MAKES HER NEUROPATHY WORSE STATES SHE FEELS LIKE SHE IS ON FIRE    Metformin      diarrhea    Sucralfate Nausea And Vomiting     BODY ACHES, FEVER    Evaristo Bury [Insulin Degludec]     Victoza [Liraglutide] Nausea And  Vomiting    Flaxseed [Linseed Oil] Rash    Omega 3 [Fish Oil] Rash    Peanut-Containing Drug Products Rash        The following sections were reviewed this encounter by the provider:   Tobacco   Allergies   Meds   Problems   Med Hx   Surg Hx   Fam Hx            VITALS:  Vitals:    04/13/21 0916   BP: (!) 154/99   BP Site: Left arm   Patient Position: Sitting   Cuff Size: Large   Pulse: 78   Resp: 16   Temp: 97.6 F (36.4 C)   TempSrc: Oral   SpO2: 96%   Weight: 94.3 kg (208 lb)          ROS:  General/Constitutional:   Denies Chills. Denies Fatigue. Denies Fever.   Ophthalmologic:   Denies Blurred vision. Denies Eye Pain.   ENT:    Denies Nasal Discharge. Denies Ear pain. Denies Sinus pain.   Endocrine:   Denies Polydipsia. Denies Polyuria.   Respiratory:   Denies Cough. Denies Orthopnea. Denies Shortness of breath. Denies Wheezing.   Cardiovascular:   Denies Chest pain. Denies Chest pain with exertion. Denies Leg Claudication. Denies      Palpitations. Denies Swelling in hands/feet.   Gastrointestinal:   Denies Abdominal pain. Denies Blood in stool. Denies Constipation. Denies Diarrhea.      Denies Heartburn. Denies Nausea. Denies Vomiting.   Genitourinary:   Denies Blood in urine. Denies Frequent urination. Denies Painful urination.   Musculoskeletal:   + Leg cramps. + Muscle aches.   Skin:   Denies Skin lesion(s).   Neurologic:   Denies Dizziness. +Gait abnormality. Denies Headache. +  Tingling/Numbness.      Physical Exam:  Alert and oriented. Affect is normal and appropriate. Clear thought process with normal reasoning and conversational tone and logic. No psychomotor retardation. Not in any acute distress  Skin: no rash or abnormal lesions  Eyes - normal eye movements.  Pupils equal and reactive to light  Eyelid normal on exam  Ears - TM clear, external ear normal  Nose - no acute changes  Throat - Oral mucosa moist and pink, no abnormality noted  Neck- No thyroid enlargement noted, no palpable cervical lymph nodes  HEART: S1S2 heard, regular rate and rhythm  LUNGS: good air exchange, no crackles or ronchi or wheezing  ABDOMEN: Positive bowel sounds, no hepatospleenomegaly appreciated, no tenderness   Back: Normal curvature, no tenderness.   Extremities: FROM, no deformities, no edema, no erythema.   No knee effusion.   mild tendernesson the joint line.  Neuro: Physiological, speech better than before.   Right side weakness.   Restricted and only able to abduct <20%, Right leg weakness.   Strength RUE and RLE 3/5.        Psych-good eye contact  Train of thought normal  Judgment normal    Lab Results   Component Value Date    WBC 7.99  09/11/2020    HGB 12.5 09/11/2020    HCT 40.2 09/11/2020    PLT 147 09/11/2020    CHOL 193 12/30/2020    TRIG 139 12/30/2020    HDL 45 12/30/2020    LDL 120 (H) 12/30/2020    ALT 17 12/30/2020    AST 18 12/30/2020    NA 140 12/30/2020    K 4.6 12/30/2020    CL 100 12/30/2020  CREAT 0.9 12/30/2020    BUN 15.0 12/30/2020    CO2 30 (H) 12/30/2020    TSH 2.63 12/30/2020    INR 1.0 05/10/2019    GLU 170 (H) 12/30/2020    HGBA1C 8.6 (H) 12/30/2020     Assessment/Plan:       1. Diabetic peripheral neuropathy  - Comprehensive metabolic panel  - Hemoglobin A1C  - Lipid panel  - Urine Microalbumin Random    2. Uncontrolled type 2 diabetes mellitus with hyperglycemia  - Comprehensive metabolic panel  - Hemoglobin A1C  - Lipid panel  - Urine Microalbumin Random    3. History of hemorrhagic stroke with residual hemiparesis  - Comprehensive metabolic panel  - Hemoglobin A1C  - Lipid panel  - Urine Microalbumin Random    4. Essential hypertension  - Comprehensive metabolic panel  - Hemoglobin A1C  - Lipid panel  - Urine Microalbumin Random    5. Mixed hyperlipidemia  - Comprehensive metabolic panel  - Hemoglobin A1C  - Lipid panel  - Urine Microalbumin Random    Diabetes-follow-up with Endo  Continue metformin and glipizide  Low carbohydrate diet  Regular exercise routine discussed  Patient needs hepatitis B vaccine-but she defers today  Should follow-up with ophthalmologist regularly    Hypertension blood pressure fluctuating  High today  Patient states that she just took her medication  Importance of keeping her blood pressure control especially given her risk factors including a history of stroke  Patient agrees to monitor  Follow-up with cardiology      Advised do second covid booster-she defers  Non immune to hep B - labs 05/2020      HLD- intolerant to statin  On Repatha  Check lipid     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    This note was generated by the Epic EMR system/ Dragon speech recognition and may contain inherent errors or omissions not intended by the user. Grammatical errors, random word insertions, deletions, pronoun errors and incomplete sentences are occasional consequences of this technology due to software limitations. Not all errors are caught or corrected. If there are questions or concerns about the content of this note or information contained within the body of this dictation they should be addressed directly with the author for clarification      Rebecca Eaton, MD

## 2021-04-23 ENCOUNTER — Encounter (INDEPENDENT_AMBULATORY_CARE_PROVIDER_SITE_OTHER): Payer: Self-pay

## 2021-04-24 ENCOUNTER — Encounter (INDEPENDENT_AMBULATORY_CARE_PROVIDER_SITE_OTHER): Payer: Self-pay

## 2021-04-30 ENCOUNTER — Encounter (INDEPENDENT_AMBULATORY_CARE_PROVIDER_SITE_OTHER): Payer: Self-pay | Admitting: Cardiovascular Disease

## 2021-04-30 ENCOUNTER — Ambulatory Visit (INDEPENDENT_AMBULATORY_CARE_PROVIDER_SITE_OTHER): Payer: Medicare Other | Admitting: Cardiovascular Disease

## 2021-04-30 VITALS — BP 134/70 | HR 72 | Ht 65.0 in | Wt 203.0 lb

## 2021-04-30 DIAGNOSIS — I6529 Occlusion and stenosis of unspecified carotid artery: Secondary | ICD-10-CM

## 2021-04-30 DIAGNOSIS — E1142 Type 2 diabetes mellitus with diabetic polyneuropathy: Secondary | ICD-10-CM

## 2021-04-30 DIAGNOSIS — I639 Cerebral infarction, unspecified: Secondary | ICD-10-CM

## 2021-04-30 DIAGNOSIS — I10 Essential (primary) hypertension: Secondary | ICD-10-CM

## 2021-04-30 DIAGNOSIS — E782 Mixed hyperlipidemia: Secondary | ICD-10-CM

## 2021-04-30 DIAGNOSIS — E785 Hyperlipidemia, unspecified: Secondary | ICD-10-CM

## 2021-04-30 NOTE — Progress Notes (Signed)
[facility] PROGRESS NOTE    [facility] -CARDIOLOGY  [phone]  [address]  [place] [address]  Dept: [phone]  Dept Fax: [phone]       Patient Name: Emily Rowe, Emily Rowe    Date of Visit:  April 30, 2021  Date of Birth: Jul 14, 1951  AGE: 70 y.o.  Medical Record #: [id number]  Requesting Physician: Rebecca Eaton, MD      CHIEF COMPLAINT: Medication Review      HISTORY OF PRESENT ILLNESS:    She is a pleasant 70 y.o. female has a history of HTN, HLD, prior CVA in 2012 and diabetes.      Patient presents today for regular follow-up.  In general she is feeling much better than previous visits.  Fewer panic attacks and less anxiety.  Returned from a trip to [place] where she visited family.  She continues to have some stable dyspnea.  She had a good response to PCSK9 inhibitor, but her blood sugars have been a bit higher recently.  Otherwise stable.        PAST MEDICAL HISTORY: She has a past medical history of Anxiety, Asthma, Bowel perforation, Depression, Diabetes, Difficulty walking, Dysphagia, Gastroesophageal reflux disease, Hyperlipemia, Hypertensive disorder, Hypokalemia, Neuropathy, Post-operative nausea and vomiting, Sleep apnea (2017), Stroke (2012), TIA (transient ischemic attack), and Type 2 diabetes mellitus, controlled (2021). She has a past surgical history that includes Tonsillectomy; Reduction mammaplasty; urinary bladder; Colonoscopy; Colonoscopy (N/A, 09/17/2020); EGD (N/A, 10/28/2020); Hysterectomy; Oophorectomy (Bilateral); and Breast biopsy (Left).    ALLERGIES:   Allergies   Allergen Reactions    Codeine Nausea And Vomiting    Crestor [Rosuvastatin] Rash     D/c due to severe rash and higher blood sugars    Glucosamine Hives and Nausea And Vomiting    Latex Rash    Tree Nuts Hives     ESPECIALLY ALMONDS     Dairy [Milk-Related Compounds]     Eggs Or Egg-Derived Products      Patient had an allergy test and  stated that she was allergic to eggs    Gabapentin      Dizziness and rash, irregular heart rate    Gluten Meal     Invokana [Canagliflozin]     Levemir [Insulin Detemir] Nausea And Vomiting    Lyrica [Pregabalin]      PT STATES IT MAKES HER NEUROPATHY WORSE STATES SHE FEELS LIKE SHE IS ON FIRE    Metformin      diarrhea    Sucralfate Nausea And Vomiting     BODY ACHES, FEVER    Evaristo Bury [Insulin Degludec]     Victoza [Liraglutide] Nausea And Vomiting    Flaxseed [Linseed Oil] Rash    Omega 3 [Fish Oil] Rash    Peanut-Containing Drug Products Rash       MEDICATIONS:     Current Outpatient Medications:     aspirin 81 MG chewable tablet, Chew 81 mg by mouth twice a week, Disp: , Rfl:     evolocumab (REPATHA) 140 MG/ML subcutaneous auto-injector, Inject 1 mL (140 mg total) into the skin every 14 (fourteen) days, Disp: 6 pen, Rfl: 3    glipiZIDE (GLUCOTROL) 10 MG 24 hr tablet, Take 1 tablet (10 mg total) by mouth 2 (two) times daily (Patient taking differently: Take 10 mg by mouth daily), Disp: 180 tablet, Rfl: 0    glucose blood test strip, Check sugar three times a day,  ICD 10 E11.9, Disp: 100 each, Rfl: 10    insulin glargine (LANTUS SOLOSTAR) 100 UNIT/ML injection pen, Inject 10 units into skin daily at bedtime ONLY AS NEEDED IF BLOOD SUGAR >OR = TO 170 LAST TAKEN 07/2020, Disp: 15 mL, Rfl: 1    linaGLIPtin (Tradjenta) 5 MG Tab, Take 1 tablet (5 mg total) by mouth daily, Disp: 90 tablet, Rfl: 3    LORazepam (ATIVAN) 0.5 MG tablet, Take 0.5 tablets (0.25 mg total) by mouth nightly as needed for Anxiety, Disp: 20 tablet, Rfl: 0    losartan (COZAAR) 50 MG tablet, TAKE 1 TABLET(50 MG) BY MOUTH TWICE DAILY (Patient taking differently: Take 100 mg by mouth nightly), Disp: 180 tablet, Rfl: 2    verapamil ER (CALAN-SR) 120 MG CR tablet, Take 1 tablet (120 mg total) by mouth every morning, Disp: 90 tablet, Rfl: 1       FAMILY HISTORY: family history includes Breast cancer (age of onset: 48) in her daughter; Diabetes in her  maternal grandmother and mother; Heart attack in her mother; Stroke in her maternal grandfather and maternal grandmother.    SOCIAL HISTORY: She reports that she has never smoked. She has never used smokeless tobacco.    PHYSICAL EXAMINATION    Visit Vitals  BP 134/70 (BP Site: Right arm, Patient Position: Sitting, Cuff Size: Medium)   Pulse 72   Ht 1.651 m (5\' 5" )   Wt 92.1 kg (203 lb)   BMI 33.78 kg/m       General Appearance:  An anxious-appearing female in no acute distress.    Skin: Warm and dry to touch, no apparent skin lesions, or masses noted.  Head: Normocephalic, normal hair pattern, no masses or tenderness   Eyes: EOMS Intact, PERRL, conjunctivae and lids unremarkable.  ENT: Ears, Nose and throat reveal no gross abnormalities.  No pallor or cyanosis.  Dentition good.   Neck: JVP normal, no carotid bruit, thyroid not enlarged   Chest: Clear to auscultation bilaterally with good air movement and respiratory effort and no wheezes, rales, or rhonchi   Cardiovascular: Regular rhythm, S1 normal, S2 normal, No S3 or S4, Apical impulse not displaced. No murmurs. No gallops or rubs detected   Abdomen: Soft, nontender, nondistended, with normoactive bowel sounds. No organomegaly.  No pulsatile masses, or bruits.   Extremities: Warm without edema. No clubbing, or cyanosis. All peripheral pulses are full and equal.   Neuro: Alert and oriented x3.       LABS:   No results found for: CBC  Lab Results   Component Value Date    AST 18 04/13/2021    ALT 14 04/13/2021     No results found for: LIPID  Lab Results   Component Value Date    HGBA1C 9.1 (H) 04/13/2021    BNP 24 06/05/2018    TSH 2.63 12/30/2020           IMPRESSION:   1. Hypertension, currently controlled.    a.  Intolerances w/ toprol XL, lisinopril, and chlorthalidone.  2. Previous palpitations with unremarkable Body Guardian monitor 05/2017  3. Hyperlipidemia.    Intolerant to statins    LDL less than 70 on PCSK9 inhibitor  4. Obesity.   5. Structurally  normal heart by echocardiogram in 2016.   6. Insulin-dependent diabetes  7. CVA in February 2012.  8. Patient reports she had sleep study in past and was not recommended for CPAP.  9. Psychosocial stressors, panic attacks, anxiety  10. Chronic dyspnea  RECOMMENDATIONS:    Overall Ms. Croslin is doing better than compared to previous.  She reports some mild plaque on previous carotid ultrasound so we will repeat this exam.  Also echocardiogram was recommended at previous appointments for her chronic dyspnea.  Otherwise continue current medications.  I gave her the name of several SGLT2 inhibitors.  If her insurance covers any of these they would make a good addition to her pharmacotherapy regimen.                                                 Orders Placed This Encounter   Procedures    US Carotid Duplex Dopp Comp Bilateral    Echocardiogram Adult Complete W Clr/ Dopp Waveform    Office Visit ([facility] [place])       No orders of the defined types were placed in this encounter.      SIGNED:    Belinda Fisher, MD          This note was generated by the Dragon speech recognition and may contain errors or omissions not intended by the user. Grammatical errors, random word insertions, deletions, pronoun errors, and incomplete sentences are occasional consequences of this technology due to software limitations. Not all errors are caught or corrected. If there are questions or concerns about the content of this note or information contained within the body of this dictation, they should be addressed directly with the author for clarification.

## 2021-04-30 NOTE — Patient Instructions (Signed)
Check with your insurance and see if they cover any of the following SGLT2 inhibitors:    Invokana  Farxiga  Jardiance    ======================  ======================

## 2021-05-10 ENCOUNTER — Ambulatory Visit
Admission: RE | Admit: 2021-05-10 | Discharge: 2021-05-10 | Disposition: A | Discharge status: Home / Self Care | Payer: Medicare Other | Source: Ambulatory Visit | Attending: Cardiovascular Disease | Admitting: Cardiovascular Disease

## 2021-05-10 DIAGNOSIS — E782 Mixed hyperlipidemia: Secondary | ICD-10-CM | POA: Insufficient documentation

## 2021-05-10 DIAGNOSIS — I10 Essential (primary) hypertension: Secondary | ICD-10-CM

## 2021-05-10 DIAGNOSIS — I119 Hypertensive heart disease without heart failure: Secondary | ICD-10-CM | POA: Insufficient documentation

## 2021-05-11 ENCOUNTER — Telehealth (INDEPENDENT_AMBULATORY_CARE_PROVIDER_SITE_OTHER): Payer: Self-pay

## 2021-05-11 DIAGNOSIS — I10 Essential (primary) hypertension: Secondary | ICD-10-CM

## 2021-05-11 NOTE — Telephone Encounter (Signed)
Pt called back and read to her echo per  Dr Dimple Casey . Limited echo w difinity  placed . Warm transfer to schedulers to assist

## 2021-05-11 NOTE — Telephone Encounter (Signed)
Left message on machine for patient to call back to inform below info

## 2021-05-11 NOTE — Telephone Encounter (Signed)
-----   Message from Belinda Fisher, MD sent at 05/11/2021  7:42 AM EDT -----  Regarding: echo  Hi team. Please let patient know that with echo there were a couple portions of her heart we didn't see well. I recommend repeating with definity which helps [place] visualize. Please put her in for limited echo w. Definity to eval LVEF.    Thanks

## 2021-05-17 ENCOUNTER — Ambulatory Visit
Admission: RE | Admit: 2021-05-17 | Discharge: 2021-05-17 | Disposition: A | Discharge status: Home / Self Care | Payer: Medicare Other | Source: Ambulatory Visit | Attending: Cardiovascular Disease | Admitting: Cardiovascular Disease

## 2021-05-17 DIAGNOSIS — I10 Essential (primary) hypertension: Secondary | ICD-10-CM | POA: Insufficient documentation

## 2021-05-17 MED ORDER — SULFUR HEXAFLUORIDE MICROSPH 60.7-25 MG IJ SUSR
2.0000 mL | Freq: Once | INTRAMUSCULAR | Status: AC
Start: 2021-05-17 — End: 2021-05-17
  Administered 2021-05-17: 14:00:00 2 mL via INTRAVENOUS
  Filled 2021-05-17: qty 2

## 2021-05-18 ENCOUNTER — Encounter (INDEPENDENT_AMBULATORY_CARE_PROVIDER_SITE_OTHER): Payer: Self-pay | Admitting: Cardiovascular Disease

## 2021-05-24 ENCOUNTER — Encounter (INDEPENDENT_AMBULATORY_CARE_PROVIDER_SITE_OTHER): Payer: Self-pay

## 2021-05-25 ENCOUNTER — Encounter (INDEPENDENT_AMBULATORY_CARE_PROVIDER_SITE_OTHER): Payer: Self-pay

## 2021-06-07 ENCOUNTER — Encounter (INDEPENDENT_AMBULATORY_CARE_PROVIDER_SITE_OTHER): Payer: Self-pay

## 2021-06-07 ENCOUNTER — Telehealth (INDEPENDENT_AMBULATORY_CARE_PROVIDER_SITE_OTHER): Payer: Self-pay

## 2021-06-07 NOTE — Telephone Encounter (Signed)
Key:[id number]  Initiated PCSK9 prior authorization via CoverMyMeds. Answered clinical questions and submitted supporting documents.  Status: PENDING  Updated patient via MyChart message.

## 2021-06-08 ENCOUNTER — Other Ambulatory Visit (INDEPENDENT_AMBULATORY_CARE_PROVIDER_SITE_OTHER): Payer: Self-pay | Admitting: Cardiovascular Disease

## 2021-06-08 DIAGNOSIS — I10 Essential (primary) hypertension: Secondary | ICD-10-CM

## 2021-06-11 ENCOUNTER — Encounter (INDEPENDENT_AMBULATORY_CARE_PROVIDER_SITE_OTHER): Payer: Self-pay | Admitting: Family Medicine

## 2021-06-17 ENCOUNTER — Encounter (INDEPENDENT_AMBULATORY_CARE_PROVIDER_SITE_OTHER): Payer: Self-pay

## 2021-06-17 ENCOUNTER — Other Ambulatory Visit (INDEPENDENT_AMBULATORY_CARE_PROVIDER_SITE_OTHER): Payer: Self-pay

## 2021-06-17 DIAGNOSIS — I639 Cerebral infarction, unspecified: Secondary | ICD-10-CM

## 2021-06-17 DIAGNOSIS — E785 Hyperlipidemia, unspecified: Secondary | ICD-10-CM

## 2021-06-17 DIAGNOSIS — E782 Mixed hyperlipidemia: Secondary | ICD-10-CM

## 2021-06-17 MED ORDER — EVOLOCUMAB 140 MG/ML SC SOAJ
140.0000 mg | SUBCUTANEOUS | 3 refills | Status: DC
Start: 2021-06-17 — End: 2022-02-25

## 2021-06-17 NOTE — Telephone Encounter (Signed)
Received notification that Repatha has been approved through 06/07/2022.    Updated pt via MyChart message.

## 2021-06-24 ENCOUNTER — Encounter (INDEPENDENT_AMBULATORY_CARE_PROVIDER_SITE_OTHER): Payer: Self-pay

## 2021-06-25 ENCOUNTER — Encounter (INDEPENDENT_AMBULATORY_CARE_PROVIDER_SITE_OTHER): Payer: Self-pay

## 2021-07-10 NOTE — Progress Notes (Signed)
Date: 07/12/2021 8:45 AM   Patient ID: Emily Rowe is a 70 y.o. female.    Chief Complaint:  Chief Complaint   Patient presents with    Diabetes Follow-up     Patient is fasting           HPI:  70 year old female with diabetes, stroke, hypertension, recent hospitalization status post colonoscopy for perforation in for fu    States in lot of pain all over the body  She ahs been to PT  But feels pain even with massaging  Water therapy was the best per pt- she states she needs new order    Anxiety "horrible" and worse still with depression  Seeing counsellor  Pain in her legs and back and she is worried  Feels weather is making it worse  Going to therapy  Denies SI        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  Dr [name] sent me a note:  Edrick Oh, MD  Rebecca Eaton, MD  1. Virtual colonoscopy suboptimal distension but negative.  Probably would do a yearly FIT for f/u on this pt given hx   2. 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  Would repeat MR in 2 y   Patient had a repeat MRI 12/06/2020-cyst is just about the same and there is no new abnormality    DM-= managed by endo  Lab Results   Component Value Date    HGBA1C 9.1 (H) 04/13/2021   Currently following up with the endocrinologist    Patient complains of pain antibody  Doing physical therapy but they are unable to perform because she has severe pain  Patient is weaker on the right side of her body because of her previous stroke but also she has some left-sided neuropathy and numbness and compensation pain    The other 14 point review systems are negative except those in HPI.    Current Medications:  Outpatient Medications Marked as Taking for the 07/12/21 encounter (Office Visit) with Rebecca Eaton, MD   Medication Sig Dispense Refill    aspirin 81 MG chewable tablet Chew 81 mg by mouth twice a week      evolocumab (REPATHA) 140 MG/ML subcutaneous auto-injector Inject 1 mL (140 mg  total) into the skin every 14 (fourteen) days 6 pen 3    glipiZIDE (GLUCOTROL) 10 MG 24 hr tablet Take 1 tablet (10 mg total) by mouth 2 (two) times daily (Patient taking differently: Take 10 mg by mouth daily) 180 tablet 0    glucose blood test strip Check sugar three times a day, ICD 10 E11.9 100 each 10    insulin glargine (LANTUS SOLOSTAR) 100 UNIT/ML injection pen Inject 10 units into skin daily at bedtime ONLY AS NEEDED IF BLOOD SUGAR >OR = TO 170  LAST TAKEN 07/2020 15 mL 1    linaGLIPtin (Tradjenta) 5 MG Tab Take 1 tablet (5 mg total) by mouth daily 90 tablet 3    LORazepam (ATIVAN) 0.5 MG tablet Take 0.5 tablets (0.25 mg total) by mouth nightly as needed for Anxiety 20 tablet 0    losartan (COZAAR) 50 MG tablet TAKE 1 TABLET(50 MG) BY MOUTH TWICE DAILY (Patient taking differently: Take 100 mg by mouth nightly) 180 tablet 2    verapamil ER (CALAN-SR) 120 MG CR tablet TAKE 1 TABLET(120 MG) BY MOUTH EVERY MORNING 90 tablet 3       Past Medical  History:  Past Medical History:   Diagnosis Date    Anxiety     Asthma     allergic reaction to mold, not since 20 years, HAS WHEEZING IN COLD WEATHER. NO INHALERS    Bowel perforation     COLONOSCOPY 09/2019    Depression     Diabetes     Difficulty walking     WEAK RIGHT LEG AFTER CVA 2021    Dysphagia     BIG PILLS ONLY     Gastroesophageal reflux disease     PREVACID OTC PRN     Hyperlipemia     Hypertensive disorder     Hypokalemia     CMP REQ PCP 09/14/20    Neuropathy     NUMBNESS RIGHT UE AND LE     Post-operative nausea and vomiting     ALWAYS WITH ANESTHESIA     Sleep apnea 2017    NO CPAP NEEDED PER PT     Stroke 2012     ARM, LEG WEAK. WALKS WITH CANE, WALKER    TIA (transient ischemic attack)     MULTIPLE BEFORE CVA 2012    Type 2 diabetes mellitus, controlled 2021    MANAGED BY DR Binnie Rail RAO. AM FSB 117-140. TAKES LANTUS PRN IN AM. HAIC 7.9       Past Surgical History:  Past Surgical History:   Procedure Laterality Date    BREAST BIOPSY Left     benign lump  biopsy    COLONOSCOPY      X 2    COLONOSCOPY N/A 09/17/2020    Procedure: COLONOSCOPY;  Surgeon: Edrick Oh, MD;  Location: [facility];  Service: Gastroenterology;  Laterality: N/A;    EGD N/A 10/28/2020    Procedure: EGD w/ bx's;  Surgeon: Philbert Riser, MD;  Location: [facility];  Service: Gastroenterology;  Laterality: N/A;    HYSTERECTOMY      2005    OOPHORECTOMY Bilateral     2005    REDUCTION MAMMAPLASTY      TONSILLECTOMY      urinary bladder      BLADDER LIFT        Family History:  Family History   Problem Relation Age of Onset    Diabetes Mother     Heart attack Mother     Diabetes Maternal Grandmother     Stroke Maternal Grandmother     Breast cancer Daughter 30    Stroke Maternal Grandfather        Social History:   reports that she has never smoked. She has never used smokeless tobacco.    Allergies:  Allergies   Allergen Reactions    Codeine Nausea And Vomiting    Crestor [Rosuvastatin] Rash     D/c due to severe rash and higher blood sugars    Glucosamine Hives and Nausea And Vomiting    Latex Rash    Tree Nuts Hives     ESPECIALLY ALMONDS     Dairy [Milk-Related Compounds]     Eggs Or Egg-Derived Products      Patient had an allergy test and stated that she was allergic to eggs    Gabapentin      Dizziness and rash, irregular heart rate    Gluten Meal     Invokana [Canagliflozin]     Levemir [Insulin Detemir] Nausea And Vomiting    Lyrica [Pregabalin]      PT STATES IT MAKES HER NEUROPATHY WORSE  STATES SHE FEELS LIKE SHE IS ON FIRE    Metformin      diarrhea    Sucralfate Nausea And Vomiting     BODY ACHES, FEVER    Evaristo Bury [Insulin Degludec]     Victoza [Liraglutide] Nausea And Vomiting    Flaxseed [Linseed Oil] Rash    Omega 3 [Fish Oil] Rash    Peanut-Containing Drug Products Rash        The following sections were reviewed this encounter by the provider:   Tobacco  Allergies  Meds  Problems  Med Hx  Surg Hx  Fam Hx         VITALS:  Vitals:    07/12/21 0824    BP: 172/87   BP Site: Left arm   Patient Position: Sitting   Cuff Size: Large   Pulse: 76   Resp: 16   Temp: 97.9 F (36.6 C)   TempSrc: Oral   SpO2: 96%   Weight: 94.8 kg (209 lb)            ROS:  General/Constitutional:   Denies Chills. Denies Fatigue. Denies Fever.   Ophthalmologic:   Denies Blurred vision. Denies Eye Pain.   ENT:   Denies Nasal Discharge. Denies Ear pain. Denies Sinus pain.   Endocrine:   Denies Polydipsia. Denies Polyuria.   Respiratory:   Denies Cough. Denies Orthopnea. Denies Shortness of breath. Denies Wheezing.   Cardiovascular:   Denies Chest pain. Denies Chest pain with exertion. Denies Leg Claudication. Denies      Palpitations. Denies Swelling in hands/feet.   Gastrointestinal:   Denies Abdominal pain. Denies Blood in stool. Denies Constipation. Denies Diarrhea.      Denies Heartburn. Denies Nausea. Denies Vomiting.   Genitourinary:   Denies Blood in urine. Denies Frequent urination. Denies Painful urination.   Musculoskeletal:   + Leg cramps. + Muscle aches.   Skin:   Denies Skin lesion(s).   Neurologic:   Denies Dizziness. +Gait abnormality. Denies Headache. +  Tingling/Numbness.      Physical Exam:  Alert and oriented. Affect is normal and appropriate. Clear thought process with normal reasoning and conversational tone and logic. No psychomotor retardation. Not in any acute distress  Skin: no rash or abnormal lesions  Eyes - normal eye movements.  Pupils equal and reactive to light  Eyelid normal on exam  Ears - TM clear, external ear normal  Nose - no acute changes  Throat - Oral mucosa moist and pink.  Neck- LEFT SIDE neck thyroid nodule, nontender, no palpable cervical lymph nodes  HEART: S1S2 heard, regular rate and rhythm  LUNGS: good air exchange, no crackles or ronchi or wheezing  ABDOMEN: Positive bowel sounds, no hepatospleenomegaly appreciated, no tenderness   Back: Normal curvature, no tenderness.   Extremities: FROM, no deformities, no edema, no erythema.   No knee  effusion.   mild tendernesson the joint line.  Neuro: Physiological, speech better than before.   Right side weakness.   Restricted and only able to abduct <20%, Right leg weakness.   Strength RUE and RLE 3/5.        Psych-good eye contact  Train of thought normal  Judgment normal    Lab Results   Component Value Date    WBC 7.99 09/11/2020    HGB 12.5 09/11/2020    HCT 40.2 09/11/2020    PLT 147 09/11/2020    CHOL 132 04/13/2021    TRIG 139 04/13/2021    HDL 42 04/13/2021  LDL 62 04/13/2021    ALT 14 04/13/2021    AST 18 04/13/2021    NA 138 04/13/2021    K 4.5 04/13/2021    CL 102 04/13/2021    CREAT 0.9 04/13/2021    BUN 17.0 04/13/2021    CO2 28 04/13/2021    TSH 2.63 12/30/2020    INR 1.0 05/10/2019    GLU 193 (H) 04/13/2021    HGBA1C 9.1 (H) 04/13/2021     Assessment/Plan:       1. Uncontrolled type 2 diabetes mellitus with hyperglycemia  - Comprehensive metabolic panel  - Hemoglobin A1C  - Lipid panel  - Urine Microalbumin Random    2. History of hemorrhagic stroke with residual hemiparesis  - Ambulatory referral to Neurology    3. Diabetic peripheral neuropathy  - Comprehensive metabolic panel  - Hemoglobin A1C  - Lipid panel  - Urine Microalbumin Random    4. Depression, unspecified depression type  - Ambulatory referral to Behavioral Health  - sertraline (Zoloft) 50 MG tablet; Half tab po qd 1 to 2 weeks and then one tab daily  Dispense: 30 tablet; Refill: 1    5. Mixed hyperlipidemia    6. Anemia, unspecified type    7. Essential hypertension    8. Need for vaccination    9. Thyroid nodule  - [place] Head Neck Soft Tissue; Future    Diabetes-follow-up with Endo  Continue Tradjenta, lantus  10 units and glipizide  Low carbohydrate diet  Regular exercise routine discussed  Patient needs hepatitis B vaccine-but she defers today  Should follow-up with ophthalmologist regularly    Hypertension blood pressure fluctuating  High today- pt to see cardio - multiple med allergy  Start coreg if ok with cardio-we will  start 3.125 twice a day with meal  If she has any side effects she will stop it and let me know  She has an appointment today to see cardiology for an echocardiogram  Patient states that she just took her medication for blood pressure but it is extremely high  Risk of bleeding and stroke with such high blood pressure discussed  Patient agrees to monitor  Follow-up with cardiology      Advised do second covid booster-she defers  Non immune to hep B - labs 05/2020      HLD- intolerant to statin  On Repatha- now only doing monthly due to increase in sugar  Check lipid     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    Black box warning of these kind of medication including suicidal thoughts discussed. Pt will inform a family member about starting this med and if they notice any untoward behavior by the patient, advised stop the med and seek immediate medical help by calling us.    Return for . 3 weeks fu med check, 2 months Medicare AWV.      This note was generated by the Epic EMR system/ Dragon speech recognition and may contain inherent errors or omissions not intended by the user. Grammatical errors,  random word insertions, deletions, pronoun errors and incomplete sentences are occasional consequences of this technology due to software limitations. Not all errors are caught or corrected. If there are questions or concerns about the content of this note or information contained within the body of this dictation they should be  addressed directly with the author for clarification      Rebecca Eaton, MD

## 2021-07-12 ENCOUNTER — Telehealth (INDEPENDENT_AMBULATORY_CARE_PROVIDER_SITE_OTHER): Payer: Self-pay

## 2021-07-12 ENCOUNTER — Encounter (INDEPENDENT_AMBULATORY_CARE_PROVIDER_SITE_OTHER): Payer: Self-pay | Admitting: Family Medicine

## 2021-07-12 ENCOUNTER — Ambulatory Visit
Admission: RE | Admit: 2021-07-12 | Discharge: 2021-07-12 | Disposition: A | Discharge status: Home / Self Care | Payer: Medicare Other | Source: Ambulatory Visit | Attending: Cardiovascular Disease | Admitting: Cardiovascular Disease

## 2021-07-12 ENCOUNTER — Ambulatory Visit (INDEPENDENT_AMBULATORY_CARE_PROVIDER_SITE_OTHER): Payer: Medicare Other | Admitting: Family Medicine

## 2021-07-12 VITALS — BP 172/87 | HR 76 | Temp 97.9°F | Resp 16 | Wt 209.0 lb

## 2021-07-12 DIAGNOSIS — I10 Essential (primary) hypertension: Secondary | ICD-10-CM

## 2021-07-12 DIAGNOSIS — E782 Mixed hyperlipidemia: Secondary | ICD-10-CM

## 2021-07-12 DIAGNOSIS — M2550 Pain in unspecified joint: Secondary | ICD-10-CM

## 2021-07-12 DIAGNOSIS — I6529 Occlusion and stenosis of unspecified carotid artery: Secondary | ICD-10-CM

## 2021-07-12 DIAGNOSIS — E041 Nontoxic single thyroid nodule: Secondary | ICD-10-CM

## 2021-07-12 DIAGNOSIS — E1165 Type 2 diabetes mellitus with hyperglycemia: Secondary | ICD-10-CM

## 2021-07-12 DIAGNOSIS — F32A Depression, unspecified: Secondary | ICD-10-CM

## 2021-07-12 DIAGNOSIS — D649 Anemia, unspecified: Secondary | ICD-10-CM

## 2021-07-12 DIAGNOSIS — I69359 Hemiplegia and hemiparesis following cerebral infarction affecting unspecified side: Secondary | ICD-10-CM

## 2021-07-12 DIAGNOSIS — Z23 Encounter for immunization: Secondary | ICD-10-CM

## 2021-07-12 DIAGNOSIS — E1142 Type 2 diabetes mellitus with diabetic polyneuropathy: Secondary | ICD-10-CM

## 2021-07-12 DIAGNOSIS — I6523 Occlusion and stenosis of bilateral carotid arteries: Secondary | ICD-10-CM | POA: Insufficient documentation

## 2021-07-12 LAB — LIPID PANEL
Cholesterol / HDL Ratio: 3.6 Index
Cholesterol: 150 mg/dL (ref 0–199)
HDL: 42 mg/dL (ref 40–9999)
LDL Calculated: 79 mg/dL (ref 0–99)
Triglycerides: 145 mg/dL (ref 34–149)
VLDL Calculated: 29 mg/dL (ref 10–40)

## 2021-07-12 LAB — COMPREHENSIVE METABOLIC PANEL
ALT: 13 U/L (ref 0–55)
AST (SGOT): 17 U/L (ref 5–41)
Albumin/Globulin Ratio: 1.1 (ref 0.9–2.2)
Albumin: 3.6 g/dL (ref 3.5–5.0)
Alkaline Phosphatase: 100 U/L (ref 37–117)
Anion Gap: 7 (ref 5.0–15.0)
BUN: 11 mg/dL (ref 7.0–21.0)
Bilirubin, Total: 0.4 mg/dL (ref 0.2–1.2)
CO2: 29 mEq/L (ref 17–29)
Calcium: 9.1 mg/dL (ref 8.5–10.5)
Chloride: 103 mEq/L (ref 99–111)
Creatinine: 0.8 mg/dL (ref 0.4–1.0)
Globulin: 3.3 g/dL (ref 2.0–3.6)
Glucose: 178 mg/dL — ABNORMAL HIGH (ref 70–100)
Potassium: 4.1 mEq/L (ref 3.5–5.3)
Protein, Total: 6.9 g/dL (ref 6.0–8.3)
Sodium: 139 mEq/L (ref 135–145)

## 2021-07-12 LAB — HEMOLYSIS INDEX: Hemolysis Index: 18 Index (ref 0–24)

## 2021-07-12 LAB — HEMOGLOBIN A1C
Average Estimated Glucose: 203 mg/dL
Hemoglobin A1C: 8.7 % — ABNORMAL HIGH (ref 4.6–5.9)

## 2021-07-12 LAB — MICROALBUMIN, RANDOM URINE
Urine Creatinine, Random: 49.3 mg/dL
Urine Microalbumin, Random: 12 ug/ml (ref 0.0–30.0)
Urine Microalbumin/Creatinine Ratio: 24 ug/mg (ref 0–30)

## 2021-07-12 LAB — GFR: EGFR: >60

## 2021-07-12 MED ORDER — SERTRALINE HCL 50 MG PO TABS
ORAL_TABLET | ORAL | 1 refills | Status: DC
Start: 2021-07-12 — End: 2022-02-25

## 2021-07-12 MED ORDER — CARVEDILOL 3.125 MG PO TABS
3.1250 mg | ORAL_TABLET | Freq: Two times a day (BID) | ORAL | 1 refills | Status: DC
Start: 2021-07-12 — End: 2021-07-20

## 2021-07-12 NOTE — Telephone Encounter (Signed)
Pt presented to office regarding elevated blood pressure readings.     Pt has been experiencing elevated bp readings since last OV on 04/30/21, despite change to blood pressure medication. Patient was seen by PCP this morning who recommended pt start Coreg 3.125 BID. Pt has been experiencing ongoing s/s of fatigue, all over weakness, anxiety and depression. Pt stated due to a decrease in appetite r/t ongoing stomach concerns and allergies to certain foods, pt has had to make diet modifications. Pt experiencing swelling BLE, this has been ongoing for a while now. Due to increase in BP, pt stated she has been more anxious and worried about her overall health.     BP in office:   R arm: 176/106   L arm: 182/106  HR: 76    Pt denies any chest pain, palpations, shortness of breath or headache.     Consulted with DOD, Dr. Garnette Scheuermann, who recommended pt start Coreg 3.125 BID and d/c Verapamil ER 120mg  daily. Advised pt take bp twice daily, and make daily BP Log. If s/s worsen, pt to be seen in ER. Pt to f/u in office in one week.     Verbalized Dr. Kandis Ban recommendations to pt. ED precautions advised. Pt agreeable and v/u.     Pt scheduled for appointment on 07/20/21 with Briant Cedar, PA @ 2:30pm.

## 2021-07-13 ENCOUNTER — Other Ambulatory Visit (INDEPENDENT_AMBULATORY_CARE_PROVIDER_SITE_OTHER): Payer: Self-pay | Admitting: Cardiovascular Disease

## 2021-07-13 ENCOUNTER — Encounter (INDEPENDENT_AMBULATORY_CARE_PROVIDER_SITE_OTHER): Payer: Self-pay | Admitting: Cardiovascular Disease

## 2021-07-13 DIAGNOSIS — I6523 Occlusion and stenosis of bilateral carotid arteries: Secondary | ICD-10-CM

## 2021-07-14 ENCOUNTER — Emergency Department
Admission: EM | Admit: 2021-07-14 | Discharge: 2021-07-14 | Disposition: A | Discharge status: Home / Self Care | Payer: Medicare Other | Attending: Emergency Medicine | Admitting: Emergency Medicine

## 2021-07-14 ENCOUNTER — Emergency Department: Payer: Medicare Other

## 2021-07-14 ENCOUNTER — Telehealth (INDEPENDENT_AMBULATORY_CARE_PROVIDER_SITE_OTHER): Payer: Self-pay

## 2021-07-14 DIAGNOSIS — G441 Vascular headache, not elsewhere classified: Secondary | ICD-10-CM | POA: Insufficient documentation

## 2021-07-14 DIAGNOSIS — I160 Hypertensive urgency: Secondary | ICD-10-CM | POA: Insufficient documentation

## 2021-07-14 DIAGNOSIS — I10 Essential (primary) hypertension: Secondary | ICD-10-CM | POA: Insufficient documentation

## 2021-07-14 LAB — CBC AND DIFFERENTIAL
Absolute NRBC: 0 10*3/uL (ref 0.00–0.00)
Basophils Absolute Automated: 0.04 10*3/uL (ref 0.00–0.08)
Basophils Automated: 0.5 %
Eosinophils Absolute Automated: 0.07 10*3/uL (ref 0.00–0.44)
Eosinophils Automated: 0.9 %
Hematocrit: 41.8 % (ref 34.7–43.7)
Hgb: 13 g/dL (ref 11.4–14.8)
Immature Granulocytes Absolute: 0.03 10*3/uL (ref 0.00–0.07)
Immature Granulocytes: 0.4 %
Lymphocytes Absolute Automated: 1.53 10*3/uL (ref 0.42–3.22)
Lymphocytes Automated: 19.2 %
MCH: 27.7 pg (ref 25.1–33.5)
MCHC: 31.1 g/dL — ABNORMAL LOW (ref 31.5–35.8)
MCV: 89.1 fL (ref 78.0–96.0)
MPV: 12.5 fL (ref 8.9–12.5)
Monocytes Absolute Automated: 0.39 10*3/uL (ref 0.21–0.85)
Monocytes: 4.9 %
Neutrophils Absolute: 5.91 10*3/uL (ref 1.10–6.33)
Neutrophils: 74.1 %
Nucleated RBC: 0 /100 WBC (ref 0.0–0.0)
Platelets: 140 10*3/uL — ABNORMAL LOW (ref 142–346)
RBC: 4.69 10*6/uL (ref 3.90–5.10)
RDW: 13 % (ref 11–15)
WBC: 7.97 10*3/uL (ref 3.10–9.50)

## 2021-07-14 LAB — COMPREHENSIVE METABOLIC PANEL
ALT: 12 U/L (ref 0–55)
AST (SGOT): 17 U/L (ref 5–41)
Albumin/Globulin Ratio: 1.1 (ref 0.9–2.2)
Albumin: 3.9 g/dL (ref 3.5–5.0)
Alkaline Phosphatase: 100 U/L (ref 37–117)
Anion Gap: 8 (ref 5.0–15.0)
BUN: 13 mg/dL (ref 7.0–21.0)
Bilirubin, Total: 0.4 mg/dL (ref 0.2–1.2)
CO2: 28 mEq/L (ref 17–29)
Calcium: 9.2 mg/dL (ref 8.5–10.5)
Chloride: 103 mEq/L (ref 99–111)
Creatinine: 0.8 mg/dL (ref 0.4–1.0)
Globulin: 3.5 g/dL (ref 2.0–3.6)
Glucose: 237 mg/dL — ABNORMAL HIGH (ref 70–100)
Potassium: 4.2 mEq/L (ref 3.5–5.3)
Protein, Total: 7.4 g/dL (ref 6.0–8.3)
Sodium: 139 mEq/L (ref 135–145)

## 2021-07-14 LAB — PT/INR
PT INR: 1 (ref 0.9–1.1)
PT: 11.9 s (ref 10.1–12.9)

## 2021-07-14 LAB — TROPONIN I: Troponin I: 0.01 ng/mL (ref 0.00–0.05)

## 2021-07-14 LAB — GLUCOSE WHOLE BLOOD - POCT: Whole Blood Glucose POCT: 234 mg/dL — ABNORMAL HIGH (ref 70–100)

## 2021-07-14 LAB — APTT: PTT: 36 s (ref 27–39)

## 2021-07-14 LAB — GFR: EGFR: >60

## 2021-07-14 MED ORDER — MECLIZINE HCL 12.5 MG PO TABS
25.0000 mg | ORAL_TABLET | Freq: Once | ORAL | Status: AC
Start: 2021-07-14 — End: 2021-07-14
  Administered 2021-07-14: 14:00:00 25 mg via ORAL
  Filled 2021-07-14: qty 2

## 2021-07-14 MED ORDER — VERAPAMIL HCL ER 120 MG PO TBCR
120.0000 mg | EXTENDED_RELEASE_TABLET | Freq: Every evening | ORAL | 0 refills | Status: DC
Start: 2021-07-14 — End: 2021-07-20

## 2021-07-14 NOTE — Telephone Encounter (Signed)
The patient called the triage line this am with c/o worsening BP 214/114 associated with nausea and dizziness after switching from Verapamil 120 mg to Cogeg 3.125 mg bid yesterday.  I explained that it can take 2 weeks to see the full affect of the coreg and advised for the patient to go to the ER for further evaluation.  I assured the patient that I would update Dr Dimple Casey and asked that she call [facility] to schedule a HFU once she is released.  The patient currently has a scheduled APP visit on 10/04.

## 2021-07-14 NOTE — Discharge Instructions (Addendum)
You can restart verpamil 120 mg daily.    Tylenol for any headaches

## 2021-07-14 NOTE — ED Provider Notes (Signed)
History     Chief Complaint   Patient presents with    Dizziness    Headache    Hypertension     This 70 year old female presents with hypertension that is been uncontrolled for more than a month.  She started having a global headache for the past 2 days and saw her primary care physician yesterday.  They changed her medication from verapamil to Coreg.  Her blood pressure however went higher so she started the verapamil again.  Patient also complains of tingling in her feet from neuropathy and for the past day tingling on the right side of the body.  No recent travel or trauma.  Her headache is an 8 out of 10.  She states she she drinks a lot of water and takes Tylenol and that usually helps the headache.  He does not want any pain medication at this time.  She denies fever, chills, cough, shortness of breath, weakness, blurred or double vision.  No recent travel or trauma.  No known COVID-19 exposures.  Patient does not take her aspirin regularly.  She states she takes it every 3 days or so because it hurts her stomach.    Activity and elevated blood pressure make the headache worse and Tylenol, rest and fluids make it better.    The history is provided by the patient.   Dizziness  Associated symptoms: headaches    Associated symptoms: no chest pain, no diarrhea, no nausea, no palpitations, no shortness of breath, no vomiting and no weakness    Headache  Associated symptoms: dizziness and numbness    Associated symptoms: no abdominal pain, no back pain, no congestion, no cough, no diarrhea, no ear pain, no eye pain, no fever, no nausea, no neck pain, no sore throat, no vomiting and no weakness    Hypertension  Associated symptoms: dizziness and headaches    Associated symptoms: no abdominal pain, no anxiety, no chest pain, no confusion, no ear pain, no fever, no nausea, no neck pain, no palpitations, no shortness of breath, not vomiting and no weakness       Past Medical History:   Diagnosis Date    Anxiety      Asthma     allergic reaction to mold, not since 20 years, HAS WHEEZING IN COLD WEATHER. NO INHALERS    Bowel perforation     COLONOSCOPY 09/2019    Depression     Diabetes     Difficulty walking     WEAK RIGHT LEG AFTER CVA 2021    Dysphagia     BIG PILLS ONLY     Gastroesophageal reflux disease     PREVACID OTC PRN     Hyperlipemia     Hypertensive disorder     Hypokalemia     CMP REQ PCP 09/14/20    Neuropathy     NUMBNESS RIGHT UE AND LE     Post-operative nausea and vomiting     ALWAYS WITH ANESTHESIA     Sleep apnea 2017    NO CPAP NEEDED PER PT     Stroke 2012     ARM, LEG WEAK. WALKS WITH CANE, WALKER    TIA (transient ischemic attack)     MULTIPLE BEFORE CVA 2012    Type 2 diabetes mellitus, controlled 2021    MANAGED BY DR Binnie Rail RAO. AM FSB 117-140. TAKES LANTUS PRN IN AM. HAIC 7.9       Past Surgical History:   Procedure Laterality Date  BREAST BIOPSY Left     benign lump biopsy    COLONOSCOPY      X 2    COLONOSCOPY N/A 09/17/2020    Procedure: COLONOSCOPY;  Surgeon: Edrick Oh, MD;  Location: [facility];  Service: Gastroenterology;  Laterality: N/A;    EGD N/A 10/28/2020    Procedure: EGD w/ bx's;  Surgeon: Philbert Riser, MD;  Location:  ENDOSCOPY OR;  Service: Gastroenterology;  Laterality: N/A;    HYSTERECTOMY      2005    OOPHORECTOMY Bilateral     2005    REDUCTION MAMMAPLASTY      TONSILLECTOMY      urinary bladder      BLADDER LIFT        Family History   Problem Relation Age of Onset    Diabetes Mother     Heart attack Mother     Diabetes Maternal Grandmother     Stroke Maternal Grandmother     Breast cancer Daughter 30    Stroke Maternal Grandfather        Social  Social History     Tobacco Use    Smoking status: Never    Smokeless tobacco: Never   Vaping Use    Vaping Use: Never used       .     Allergies   Allergen Reactions    Codeine Nausea And Vomiting    Crestor [Rosuvastatin] Rash     D/c due to severe rash and higher blood sugars    Glucosamine Hives and  Nausea And Vomiting    Latex Rash    Tree Nuts Hives     ESPECIALLY ALMONDS     Dairy [Milk-Related Compounds]     Eggs Or Egg-Derived Products      Patient had an allergy test and stated that she was allergic to eggs    Gabapentin      Dizziness and rash, irregular heart rate    Gluten Meal     Invokana [Canagliflozin]     Levemir [Insulin Detemir] Nausea And Vomiting    Lyrica [Pregabalin]      PT STATES IT MAKES HER NEUROPATHY WORSE STATES SHE FEELS LIKE SHE IS ON FIRE    Metformin      diarrhea    Sucralfate Nausea And Vomiting     BODY ACHES, FEVER    Evaristo Bury [Insulin Degludec]     Victoza [Liraglutide] Nausea And Vomiting    Flaxseed [Linseed Oil] Rash    Omega 3 [Fish Oil] Rash    Peanut-Containing Drug Products Rash       Home Medications       Med List Status: In Progress Set By: Anders Simmonds, RN at 07/14/2021 11:56 AM              aspirin 81 MG chewable tablet     Chew 81 mg by mouth twice a week     carvedilol (COREG) 3.125 MG tablet     Take 1 tablet (3.125 mg total) by mouth 2 (two) times daily with meals     evolocumab (REPATHA) 140 MG/ML subcutaneous auto-injector     Inject 1 mL (140 mg total) into the skin every 14 (fourteen) days     glipiZIDE (GLUCOTROL) 10 MG 24 hr tablet     Take 1 tablet (10 mg total) by mouth 2 (two) times daily     Patient taking differently: Take 10 mg by mouth daily  glucose blood test strip     Check sugar three times a day, ICD 10 E11.9     insulin glargine (LANTUS SOLOSTAR) 100 UNIT/ML injection pen     Inject 10 units into skin daily at bedtime ONLY AS NEEDED IF BLOOD SUGAR >OR = TO 170  LAST TAKEN 07/2020     linaGLIPtin (Tradjenta) 5 MG Tab     Take 1 tablet (5 mg total) by mouth daily     LORazepam (ATIVAN) 0.5 MG tablet     Take 0.5 tablets (0.25 mg total) by mouth nightly as needed for Anxiety     losartan (COZAAR) 50 MG tablet     TAKE 1 TABLET(50 MG) BY MOUTH TWICE DAILY     Patient taking differently: Take 100 mg by mouth nightly     sertraline (Zoloft)  50 MG tablet     Half tab po qd 1 to 2 weeks and then one tab daily             Review of Systems   Constitutional:  Negative for diaphoresis and fever.   HENT:  Negative for congestion, ear pain and sore throat.    Eyes:  Negative for pain and visual disturbance.   Respiratory:  Negative for cough, chest tightness, shortness of breath and wheezing.    Cardiovascular:  Negative for chest pain, palpitations and leg swelling.   Gastrointestinal:  Negative for abdominal pain, constipation, diarrhea, nausea and vomiting.   Genitourinary:  Negative for dysuria and urgency.   Musculoskeletal:  Negative for back pain, gait problem and neck pain.   Skin:  Negative for color change and rash.   Neurological:  Positive for dizziness, numbness and headaches. Negative for weakness.   Psychiatric/Behavioral:  Negative for confusion. The patient is not nervous/anxious.      Physical Exam    BP: 162/86, Heart Rate: 77, Temp: 97.7 F (36.5 C), Resp Rate: 16, SpO2: 98 %, Weight: 92.5 kg    Physical Exam  Vitals and nursing note reviewed.   Constitutional:       General: She is not in acute distress.     Appearance: She is well-developed. She is obese. She is not diaphoretic.   HENT:      Head: Normocephalic and atraumatic.      Right Ear: Tympanic membrane and external ear normal.      Left Ear: Tympanic membrane and external ear normal.      Nose: Nose normal.      Mouth/Throat:      Mouth: Mucous membranes are moist.      Pharynx: No oropharyngeal exudate.   Eyes:      General: No scleral icterus.        Right eye: No discharge.         Left eye: No discharge.      Conjunctiva/sclera: Conjunctivae normal.      Pupils: Pupils are equal, round, and reactive to light.   Neck:      Vascular: No JVD.      Trachea: No tracheal deviation.   Cardiovascular:      Rate and Rhythm: Normal rate and regular rhythm.      Heart sounds: Normal heart sounds. No murmur heard.  Pulmonary:      Effort: Pulmonary effort is normal. No respiratory  distress.      Breath sounds: Normal breath sounds. No wheezing.   Chest:      Chest wall: No tenderness.   Abdominal:  General: Bowel sounds are normal. There is no distension.      Palpations: Abdomen is soft.      Tenderness: There is no abdominal tenderness. There is no guarding.   Musculoskeletal:         General: No tenderness. Normal range of motion.      Cervical back: Normal range of motion and neck supple.   Skin:     General: Skin is warm and dry.      Capillary Refill: Capillary refill takes less than 2 seconds.      Coloration: Skin is not pale.   Neurological:      General: No focal deficit present.      Mental Status: She is alert and oriented to person, place, and time.      GCS: GCS eye subscore is 4. GCS verbal subscore is 5. GCS motor subscore is 6.      Coordination: Coordination normal.   Psychiatric:         Behavior: Behavior normal.         Judgment: Judgment normal.         MDM and ED Course     ED Medication Orders (From admission, onward)      Start Ordered     Status Ordering Provider    07/14/21 1348 07/14/21 1347  meclizine (ANTIVERT) tablet 25 mg  Once        Route: Oral  Ordered Dose: 25 mg     Last MAR action: Given Jina Olenick               MDM  Number of Diagnoses or Management Options  Hypertensive urgency  Vascular headache  Diagnosis management comments: Diagnosis management comments: Oxygen saturation by pulse oximetry is 95%-100%, Normal.  Interventions: None Needed.    EKG Interpretation    EKG interpreted by Melvern Sample, DO  Time: 11:06 AM  Rate: Normal  Rhythm: sinus rhythm  Axis: Normal  ST Segments ant T waves: Nonspecific T wave abnormality minimally changed from previous.    Conduction: No blocks  Impression: No acute process    Differential diagnosis: Hypertensive urgency, dehydration, electrolyte disorder, vascular headache, intracranial bleed, mass.      ED course: Patient was reevaluated multiple times.  Blood pressure and headache have improved significantly.   Patient eating and drinking and ambulating without difficulty.  Patient wanting to go home.    Patient is alert and oriented 3.  On reexamination, vital signs were stable, and patient is in no acute distress.  At this point she is stable for discharge.  Dorthey Sawyer had an opportunity to ask questions and she verbalized understanding of instructions.      I have reviewed the nursing history.    DR. Melvern Sample  is the primary attending for this patient and has obtained and performed the history, PE, and medical decision making for this patient.    When I was within 6 feet of this patient I donned the following PPE:  Surgical Mask Yes, Gloves Yes, Gown No  ; Goggles No  ; Face Shield No; N95 No.  The patient was wearing a mask during my evaluation Yes.        Results     Procedure Component Value Units Date/Time    Troponin I [[id number]] Collected: 07/14/21 1207    Specimen: Blood Updated: 07/14/21 1245     Troponin I 0.01 ng/mL     GFR [[id number]] Collected: 07/14/21 1207  Updated: 07/14/21 1244     EGFR >60.0       Comprehensive metabolic panel [[id number]]  (Abnormal) Collected: 07/14/21   1207    Specimen: Blood Updated: 07/14/21 1244     Glucose 237 mg/dL      BUN 40.9 mg/dL      Creatinine 0.8 mg/dL      Sodium 811 mEq/L      Potassium 4.2 mEq/L      Chloride 103 mEq/L      CO2 28 mEq/L      Calcium 9.2 mg/dL      Protein, Total 7.4 g/dL      Albumin 3.9 g/dL      AST (SGOT) 17 U/L      ALT 12 U/L      Alkaline Phosphatase 100 U/L      Bilirubin, Total 0.4 mg/dL      Globulin 3.5 g/dL      Albumin/Globulin Ratio 1.1     Anion Gap 8.0    Prothrombin time/INR [[id number]] Collected: 07/14/21 1207    Specimen: Blood Updated: 07/14/21 1232     PT 11.9 sec      PT INR 1.0    APTT [[id number]] Collected: 07/14/21 1207     Updated: 07/14/21 1232     PTT 36 sec     CBC and differential [[id number]]  (Abnormal) Collected: 07/14/21 1207    Specimen: Blood Updated: 07/14/21 1221     WBC 7.97 x10 3/uL      Hgb 13.0 g/dL       Hematocrit 41.3 %      Platelets 140 x10 3/uL      RBC 4.69 x10 6/uL      MCV 89.1 fL      MCH 27.7 pg      MCHC 31.1 g/dL      RDW 13 %      MPV 12.5 fL      Neutrophils 74.1 %      Lymphocytes Automated 19.2 %      Monocytes 4.9 %      Eosinophils Automated 0.9 %      Basophils Automated 0.5 %      Immature Granulocytes 0.4 %      Nucleated RBC 0.0 /100 WBC      Neutrophils Absolute 5.91 x10 3/uL      Lymphocytes Absolute Automated 1.53 x10 3/uL      Monocytes Absolute Automated 0.39 x10 3/uL      Eosinophils Absolute Automated 0.07 x10 3/uL      Basophils Absolute Automated 0.04 x10 3/uL      Immature Granulocytes Absolute 0.03 x10 3/uL      Absolute NRBC 0.00 x10 3/uL     Glucose Whole Blood - POCT [[id number]]  (Abnormal) Collected: 07/14/21   1212     Updated: 07/14/21 1216     Whole Blood Glucose POCT 234 mg/dL         Radiology Results (24 Hour)     Procedure Component Value Units Date/Time    CT Head WO Contrast [[id number]] Collected: 07/14/21 1329    Order Status: Completed Updated: 07/14/21 1335    Narrative:      HISTORY: Headache and numbness, hypertension    COMPARISON: 05/10/2019    TECHNIQUE: CT of the head performed without intravenous contrast. The  following dose reduction techniques were utilized: automated exposure  control and/or adjustment of the mA and/or KV according to patient size,  and the use  of an iterative reconstruction technique.    FINDINGS:  There are minimal periventricular hypodensities that are suggestive of  chronic small vessel ischemic changes. There is no acute cranial  hemorrhage or territorial infarct. There is no mass effect or midline  shift. The osseous structures are unchanged.      Impression:         1.  No acute intracranial abnormality is identified.    Nicoletta Dress, MD   07/14/2021 1:33 PM        *This note was generated by the Epic EMR system/ Dragon speech recognition and may contain inherent errors or omissions not intended by the user. Grammatical errors,  random word insertions, deletions, pronoun errors and incomplete sentences are occasional consequences of this technology due to software limitations. Not all errors are caught or corrected. If there are questions or concerns about the content of this note or information contained within the body of this dictation they should be addressed directly with the author for clarification           Amount and/or Complexity of Data Reviewed  Clinical lab tests: ordered and reviewed  Tests in the radiology section of CPT: ordered and reviewed  Tests in the medicine section of CPT: ordered and reviewed    .ddortho                 NIH Stroke Score      Flowsheet Row Most Recent Value   Patient's calculated Stroke Score: 1 filed at 07/14/2021 1158            Procedures    Clinical Impression & Disposition     Clinical Impression  Final diagnoses:   Hypertensive urgency   Vascular headache        ED Disposition       ED Disposition   Discharge    Condition   --    Date/Time   Wed Jul 14, 2021 1525    Comment   Dorthey Sawyer discharge to home/self care.    Condition at disposition: Stable                  Discharge Medication List as of 07/14/2021  3:36 PM        START taking these medications    Details   verapamil ER (CALAN-SR) 120 MG CR tablet Take 1 tablet (120 mg total) by mouth nightly, Starting Wed 07/14/2021, Normal                         [place], [place], DO  07/14/21 1603

## 2021-07-15 ENCOUNTER — Telehealth: Payer: Self-pay | Admitting: Family Medicine

## 2021-07-15 LAB — ECG 12-LEAD
Atrial Rate: 72 {beats}/min
IHS MUSE NARRATIVE AND IMPRESSION: NORMAL
P Axis: 36 degrees
P-R Interval: 152 ms
Q-T Interval: 378 ms
QRS Duration: 78 ms
QTC Calculation (Bezet): 413 ms
R Axis: 49 degrees
T Axis: -22 degrees
Ventricular Rate: 72 {beats}/min

## 2021-07-15 NOTE — Telephone Encounter (Signed)
Per Dr Delman Cheadle patient was advised to continue taking the Verapamil and call her Cardiologist to discuss. Patient states understanding and states she will call them.

## 2021-07-15 NOTE — Telephone Encounter (Signed)
07/15/2021    Patient is requesting to talk to a nurse because of her elevated blood pressure even with the medication provider has given. Please give her a call back as soon as possible, as she is saying it's extremely elevated and she has called twice!

## 2021-07-15 NOTE — Telephone Encounter (Signed)
Called patient-she states she was in the ER yesterday with high blood pressure. States the Coreg is elevating her blood sugar and she is very concerned. States blood sugar was 119-274 after Coreg yesterday-has not taken Coreg today, only took Verapamil because of Coreg elevating her blood sugar. Patient wants to know what she should do.   BP this morning around 6:00-6:30 was 182/111

## 2021-07-19 NOTE — Progress Notes (Signed)
HEART CARDIOLOGY OFFICE PROGRESS NOTE    [facility] OFFICE -CARDIOLOGY  [address]  [place] [address]  Dept: [phone]  Dept Fax: [phone]       Patient Name: Emily Rowe, Emily Rowe    Date of Visit:  July 20, 2021  Date of Birth: [birth year]  AGE: 70 y.o.  Medical Record #: [id number]  Requesting Physician: Rebecca Eaton, MD      CHIEF COMPLAINT: BP check - triage      HISTORY OF PRESENT ILLNESS:    She is a pleasant 70 y.o. female who presents today for emergency department follow-up.  She has been having difficulty with uncontrolled hypertension for several weeks now.  We had added carvedilol to her regimen, but the patient unfortunately developed high blood sugars.  She has been having global headache, and the ER put her back on carvedilol.  However her blood sugar again spiked.  As such she is off the carvedilol and wants to know about alternatives.  She is taking verapamil in place of it.  An echocardiogram back in August with contrast showed LVEF of 70%.  Carotid Doppler showed bilateral less than 50% stenosis.  She knows she has uncontrolled anxiety and is seeing a therapist and may see a geriatric specialist as well.      PAST MEDICAL HISTORY: She has a past medical history of Anxiety, Asthma, Bowel perforation, Depression, Diabetes, Difficulty walking, Dysphagia, Gastroesophageal reflux disease, Hyperlipemia, Hypertensive disorder, Hypokalemia, Neuropathy, Post-operative nausea and vomiting, Sleep apnea (2017), Stroke (2012), TIA (transient ischemic attack), and Type 2 diabetes mellitus, controlled (2021). She has a past surgical history that includes Tonsillectomy; Reduction mammaplasty; urinary bladder; Colonoscopy; Colonoscopy (N/A, 09/17/2020); EGD (N/A, 10/28/2020); Hysterectomy; Oophorectomy (Bilateral); and Breast biopsy (Left).    ALLERGIES:   Allergies   Allergen Reactions    Codeine Nausea And Vomiting    Crestor  [Rosuvastatin] Rash     D/c due to severe rash and higher blood sugars    Glucosamine Hives and Nausea And Vomiting    Latex Rash    Tree Nuts Hives     ESPECIALLY ALMONDS     Dairy [Milk-Related Compounds]     Eggs Or Egg-Derived Products      Patient had an allergy test and stated that she was allergic to eggs    Gabapentin      Dizziness and rash, irregular heart rate    Gluten Meal     Invokana [Canagliflozin]     Levemir [Insulin Detemir] Nausea And Vomiting    Lyrica [Pregabalin]      PT STATES IT MAKES HER NEUROPATHY WORSE STATES SHE FEELS LIKE SHE IS ON FIRE    Metformin      diarrhea    Sucralfate Nausea And Vomiting     BODY ACHES, FEVER    Evaristo Bury [Insulin Degludec]     Victoza [Liraglutide] Nausea And Vomiting    Flaxseed [Linseed Oil] Rash    Omega 3 [Fish Oil] Rash    Peanut-Containing Drug Products Rash       MEDICATIONS:   Patient's current medications were reviewed. ONLY Cardiac medications were updated unless others were addressed in assessment and plan.    Current Outpatient Medications:     aspirin 81 MG chewable tablet, Chew 81 mg by mouth twice a week, Disp: , Rfl:     evolocumab (REPATHA) 140 MG/ML subcutaneous auto-injector, Inject 1 mL (140 mg total) into the skin every 14 (  fourteen) days, Disp: 6 pen, Rfl: 3    glipiZIDE (GLUCOTROL) 10 MG 24 hr tablet, Take 1 tablet (10 mg total) by mouth 2 (two) times daily (Patient taking differently: Take 10 mg by mouth daily), Disp: 180 tablet, Rfl: 0    glucose blood test strip, Check sugar three times a day, ICD 10 E11.9, Disp: 100 each, Rfl: 10    insulin glargine (LANTUS SOLOSTAR) 100 UNIT/ML injection pen, Inject 10 units into skin daily at bedtime ONLY AS NEEDED IF BLOOD SUGAR >OR = TO 170 LAST TAKEN 07/2020, Disp: 15 mL, Rfl: 1    linaGLIPtin (Tradjenta) 5 MG Tab, Take 1 tablet (5 mg total) by mouth daily, Disp: 90 tablet, Rfl: 3    LORazepam (ATIVAN) 0.5 MG tablet, Take 0.5 tablets (0.25 mg total) by mouth nightly as needed for Anxiety, Disp: 20  tablet, Rfl: 0    losartan (COZAAR) 50 MG tablet, TAKE 1 TABLET(50 MG) BY MOUTH TWICE DAILY (Patient taking differently: Take 100 mg by mouth nightly), Disp: 180 tablet, Rfl: 2    sertraline (Zoloft) 50 MG tablet, Half tab po qd 1 to 2 weeks and then one tab daily (Patient taking differently: Half tab po qd 1 to 2 weeks and then one tab daily), Disp: 30 tablet, Rfl: 1    NIFEdipine XL (PROCARDIA XL) 60 MG 24 hr tablet, Take 1 tablet (60 mg total) by mouth daily, Disp: 30 tablet, Rfl: 3     FAMILY HISTORY: family history includes Breast cancer (age of onset: 57) in her daughter; Diabetes in her maternal grandmother and mother; Heart attack in her mother; Stroke in her maternal grandfather and maternal grandmother.    SOCIAL HISTORY: She reports that she has never smoked. She has never used smokeless tobacco.    PHYSICAL EXAMINATION    Visit Vitals  BP (!) 180/100 (BP Site: Left arm, Patient Position: Sitting, Cuff Size: Medium)   Pulse 67   Ht 1.651 m (5\' 5" )   Wt 92.1 kg (203 lb)   BMI 33.78 kg/m   BP recheck by PA: 164/94    General Appearance:  Cooperative, alert and oriented, in no acute distress.    Skin: Warm and dry to touch, no apparent skin lesions.  Head: Normocephalic, normal hair pattern.   Eyes: conjunctivae and lids unremarkable.  ENT: Wearing facemask. No pallor or cyanosis.  Neck: JVP normal, no carotid bruit.  Chest: Clear to auscultation bilaterally with good air movement and respiratory effort and no wheezes, rales, or rhonchi   Cardiovascular: Regular rhythm, S1 normal, S2 normal, No S3 or S4, Apical impulse not displaced, soft systolic murmur, no gallops or rubs detected   Abdomen: Soft, nontender, and nondistended.   Extremities: Warm.  No edema bilaterally. Radial pulses 2+ bilaterally.  Neuro: Alert and oriented x3. No gross motor or sensory deficits noted, anxious affect. Normal memory.    BP Readings from Last 3 Encounters:   07/20/21 (!) 180/100   07/14/21 141/64   07/12/21 172/87     Wt  Readings from Last 3 Encounters:   07/20/21 92.1 kg (203 lb)   07/14/21 92.5 kg (204 lb)   07/12/21 94.8 kg (209 lb)       LABS:   Lab Results   Component Value Date    WBC 7.97 07/14/2021    HGB 13.0 07/14/2021    HCT 41.8 07/14/2021    PLT 140 (L) 07/14/2021     Lab Results   Component Value Date  GLU 237 (H) 07/14/2021    BUN 13.0 07/14/2021    CREAT 0.8 07/14/2021    NA 139 07/14/2021    K 4.2 07/14/2021    CL 103 07/14/2021    CO2 28 07/14/2021    AST 17 07/14/2021    ALT 12 07/14/2021     Lab Results   Component Value Date    TSH 2.63 12/30/2020    HGBA1C 8.7 (H) 07/12/2021     Lab Results   Component Value Date    CHOL 150 07/12/2021    TRIG 145 07/12/2021    HDL 42 07/12/2021    LDL 79 07/12/2021           IMPRESSION:   1.         Hypertension, uncontrolled.                          a.         Intolerances w/ toprol XL, lisinopril, and chlorthalidone and now coreg (elevated BS).  2.         Previous palpitations with unremarkable Body Guardian monitor 05/2017  3.         Hyperlipidemia.                          Intolerant to statins                          LDL less than 70 on PCSK9 inhibitor  4.         Obesity.   5.         Structurally normal heart by echocardiogram in 7/255/22, EF 70% on limited definity echo 05/17/21.   6.         Insulin-dependent diabetes  7.         CVA in February 2012.  8.         Patient reports she had sleep study in past and was not recommended for CPAP.  9.         Psychosocial stressors, panic attacks, anxiety  10.       Chronic dyspnea  11. 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     RECOMMENDATIONS:   Discussed the case with Dr. Gaylyn Lambert who agrees with the below plan.  Stop verapamil.  Instead we will start nifedipine 60 mg daily.    Follow-up in 2 weeks.  If blood pressures are still uncontrolled, can increase nifedipine to 60 mg twice daily.    Consider SGLT2 inhibitors.    Continue monitoring home blood pressures and log.  She notes  controlling her anxiety will be a big part of helping to keep her blood pressures controlled.                                               Orders Placed This Encounter   Procedures    APP Office Visit ([facility] [place])     Orders Placed This Encounter   Medications    NIFEdipine XL (PROCARDIA XL) 60 MG 24 hr tablet     Sig: Take 1 tablet (60 mg total) by mouth daily     Dispense:  30 tablet     Refill:  3  SIGNED:    Theodoro Clock, PA          This note was generated by the Dragon speech recognition and may contain errors or omissions not intended by the user. Grammatical errors, random word insertions, deletions, pronoun errors, and incomplete sentences are occasional consequences of this technology due to software limitations. Not all errors are caught or corrected. If there are questions or concerns about the content of this note or information contained within the body of this dictation, they should be addressed directly with the author for clarification.

## 2021-07-20 ENCOUNTER — Encounter (INDEPENDENT_AMBULATORY_CARE_PROVIDER_SITE_OTHER): Payer: Self-pay | Admitting: Physician Assistant

## 2021-07-20 ENCOUNTER — Ambulatory Visit (INDEPENDENT_AMBULATORY_CARE_PROVIDER_SITE_OTHER): Payer: Medicare Other | Admitting: Physician Assistant

## 2021-07-20 VITALS — BP 180/100 | HR 67 | Ht 65.0 in | Wt 203.0 lb

## 2021-07-20 DIAGNOSIS — E782 Mixed hyperlipidemia: Secondary | ICD-10-CM

## 2021-07-20 DIAGNOSIS — I10 Essential (primary) hypertension: Secondary | ICD-10-CM

## 2021-07-20 DIAGNOSIS — F32A Depression, unspecified: Secondary | ICD-10-CM

## 2021-07-20 DIAGNOSIS — E1165 Type 2 diabetes mellitus with hyperglycemia: Secondary | ICD-10-CM

## 2021-07-20 DIAGNOSIS — E669 Obesity, unspecified: Secondary | ICD-10-CM

## 2021-07-20 DIAGNOSIS — I69359 Hemiplegia and hemiparesis following cerebral infarction affecting unspecified side: Secondary | ICD-10-CM

## 2021-07-20 MED ORDER — NIFEDIPINE ER OSMOTIC RELEASE 60 MG PO TB24
60.0000 mg | ORAL_TABLET | Freq: Every day | ORAL | 3 refills | Status: DC
Start: 2021-07-20 — End: 2021-08-06

## 2021-07-24 ENCOUNTER — Encounter (INDEPENDENT_AMBULATORY_CARE_PROVIDER_SITE_OTHER): Payer: Self-pay

## 2021-07-25 ENCOUNTER — Encounter (INDEPENDENT_AMBULATORY_CARE_PROVIDER_SITE_OTHER): Payer: Self-pay

## 2021-07-26 ENCOUNTER — Ambulatory Visit (INDEPENDENT_AMBULATORY_CARE_PROVIDER_SITE_OTHER): Payer: Medicare Other | Admitting: Family Medicine

## 2021-07-26 ENCOUNTER — Encounter (INDEPENDENT_AMBULATORY_CARE_PROVIDER_SITE_OTHER): Payer: Self-pay | Admitting: Family Medicine

## 2021-07-26 VITALS — BP 157/87 | HR 72 | Temp 98.0°F | Resp 16 | Wt 207.0 lb

## 2021-07-26 DIAGNOSIS — E1165 Type 2 diabetes mellitus with hyperglycemia: Secondary | ICD-10-CM

## 2021-07-26 DIAGNOSIS — Z9889 Other specified postprocedural states: Secondary | ICD-10-CM

## 2021-07-26 DIAGNOSIS — I10 Essential (primary) hypertension: Secondary | ICD-10-CM

## 2021-07-26 DIAGNOSIS — Q649 Congenital malformation of urinary system, unspecified: Secondary | ICD-10-CM

## 2021-07-26 DIAGNOSIS — Z87448 Personal history of other diseases of urinary system: Secondary | ICD-10-CM

## 2021-07-26 DIAGNOSIS — F419 Anxiety disorder, unspecified: Secondary | ICD-10-CM

## 2021-07-26 NOTE — Progress Notes (Signed)
Date: 07/26/2021 9:14 PM   Patient ID: Emily Rowe is a 70 y.o. female.    Chief Complaint:  Chief Complaint   Patient presents with    Diabetes Follow-up    Hypertension       HPI:      In for hosp ER fu    Seen me recently-uncontrolled  She checked with cardiologist  Was advised stop verapamil and started coreg  Went to ER with uncontrolled Bp and high sugar and was feeling " sick"    Er records reviewed  Stopped coreg  Restarted Verapamil    Seen cardiology  Consult note reviewed- was given nifidepine but she did not start for fear of feet swelling    SBP high but states she just took her meds    HPI       The other 14 point review systems are negative except those in HPI.    Current Medications:  Outpatient Medications Marked as Taking for the 07/26/21 encounter (Office Visit) with Rebecca Eaton, MD   Medication Sig Dispense Refill    aspirin 81 MG chewable tablet Chew 81 mg by mouth twice a week      evolocumab (REPATHA) 140 MG/ML subcutaneous auto-injector Inject 1 mL (140 mg total) into the skin every 14 (fourteen) days 6 pen 3    glipiZIDE (GLUCOTROL) 10 MG 24 hr tablet Take 1 tablet (10 mg total) by mouth 2 (two) times daily (Patient taking differently: Take 10 mg by mouth daily) 180 tablet 0    glucose blood test strip Check sugar three times a day, ICD 10 E11.9 100 each 10    insulin glargine (LANTUS SOLOSTAR) 100 UNIT/ML injection pen Inject 10 units into skin daily at bedtime ONLY AS NEEDED IF BLOOD SUGAR >OR = TO 170  LAST TAKEN 07/2020 15 mL 1    linaGLIPtin (Tradjenta) 5 MG Tab Take 1 tablet (5 mg total) by mouth daily 90 tablet 3    LORazepam (ATIVAN) 0.5 MG tablet Take 0.5 tablets (0.25 mg total) by mouth nightly as needed for Anxiety 20 tablet 0    losartan (COZAAR) 50 MG tablet TAKE 1 TABLET(50 MG) BY MOUTH TWICE DAILY (Patient taking differently: Take 100 mg by mouth nightly) 180 tablet 2    sertraline (Zoloft) 50 MG tablet Half tab po qd 1 to 2 weeks and then one tab daily  (Patient taking differently: Half tab po qd 1 to 2 weeks and then one tab daily) 30 tablet 1       Past Medical History:  Past Medical History:   Diagnosis Date    Anxiety     Asthma     allergic reaction to mold, not since 20 years, HAS WHEEZING IN COLD WEATHER. NO INHALERS    Bowel perforation     COLONOSCOPY 09/2019    Depression     Diabetes     Difficulty walking     WEAK RIGHT LEG AFTER CVA 2021    Dysphagia     BIG PILLS ONLY     Gastroesophageal reflux disease     PREVACID OTC PRN     Hyperlipemia     Hypertensive disorder     Hypokalemia     CMP REQ PCP 09/14/20    Neuropathy     NUMBNESS RIGHT UE AND LE     Post-operative nausea and vomiting     ALWAYS WITH ANESTHESIA     Sleep apnea 2017    NO CPAP  NEEDED PER PT     Stroke 2012     ARM, LEG WEAK. WALKS WITH CANE, WALKER    TIA (transient ischemic attack)     MULTIPLE BEFORE CVA 2012    Type 2 diabetes mellitus, controlled 2021    MANAGED BY DR Binnie Rail RAO. AM FSB 117-140. TAKES LANTUS PRN IN AM. HAIC 7.9       Past Surgical History:  Past Surgical History:   Procedure Laterality Date    BREAST BIOPSY Left     benign lump biopsy    COLONOSCOPY      X 2    COLONOSCOPY N/A 09/17/2020    Procedure: COLONOSCOPY;  Surgeon: Edrick Oh, MD;  Location: [facility];  Service: Gastroenterology;  Laterality: N/A;    EGD N/A 10/28/2020    Procedure: EGD w/ bx's;  Surgeon: Philbert Riser, MD;  Location: [facility];  Service: Gastroenterology;  Laterality: N/A;    HYSTERECTOMY      2005    OOPHORECTOMY Bilateral     2005    REDUCTION MAMMAPLASTY      TONSILLECTOMY      urinary bladder      BLADDER LIFT        Family History:  Family History   Problem Relation Age of Onset    Diabetes Mother     Heart attack Mother     Diabetes Maternal Grandmother     Stroke Maternal Grandmother     Breast cancer Daughter 30    Stroke Maternal Grandfather        Social History:   reports that she has never smoked. She has never used smokeless  tobacco.    Allergies:  Allergies   Allergen Reactions    Codeine Nausea And Vomiting    Crestor [Rosuvastatin] Rash     D/c due to severe rash and higher blood sugars    Glucosamine Hives and Nausea And Vomiting    Latex Rash    Tree Nuts Hives     ESPECIALLY ALMONDS     Dairy [Milk-Related Compounds]     Eggs Or Egg-Derived Products      Patient had an allergy test and stated that she was allergic to eggs    Gabapentin      Dizziness and rash, irregular heart rate    Gluten Meal     Invokana [Canagliflozin]     Levemir [Insulin Detemir] Nausea And Vomiting    Lyrica [Pregabalin]      PT STATES IT MAKES HER NEUROPATHY WORSE STATES SHE FEELS LIKE SHE IS ON FIRE    Metformin      diarrhea    Sucralfate Nausea And Vomiting     BODY ACHES, FEVER    Evaristo Bury [Insulin Degludec]     Victoza [Liraglutide] Nausea And Vomiting    Flaxseed [Linseed Oil] Rash    Omega 3 [Fish Oil] Rash    Peanut-Containing Drug Products Rash        The following sections were reviewed this encounter by the provider:   Tobacco   Allergies   Meds   Problems   Med Hx   Surg Hx   Fam Hx          VITALS:  Vitals:    07/26/21 1127   BP: 157/87   BP Site: Left arm   Patient Position: Sitting   Cuff Size: Large   Pulse: 72   Resp: 16   Temp: 98 F (36.7 C)  TempSrc: Oral   SpO2: 97%   Weight: 93.9 kg (207 lb)      ROS:  General/Constitutional:   Denies Chills. Denies Fatigue. Denies Fever.   Ophthalmologic:   Denies Blurred vision. Denies Eye Pain.   ENT:   Denies Nasal Discharge. Denies Ear pain. Denies Sinus pain.   Endocrine:   Denies Polydipsia. Denies Polyuria.   Respiratory:   Denies Cough. Denies Orthopnea. Denies Shortness of breath. Denies Wheezing.   Cardiovascular:   Denies Chest pain. Denies Chest pain with exertion. Denies Leg Claudication. Denies      Palpitations. Denies Swelling in hands/feet.   Gastrointestinal:   Denies Abdominal pain. Denies Blood in stool. Denies Constipation. Denies Diarrhea.      Denies Heartburn. Denies Nausea.  Denies Vomiting.   Genitourinary:   Denies Blood in urine. Denies Frequent urination. Denies Painful urination.   Musculoskeletal:   + Leg cramps. + Muscle aches.   Skin:   Denies Skin lesion(s).   Neurologic:   Denies Dizziness. +Gait abnormality. Denies Headache. +  Tingling/Numbness.      Physical Exam:  Alert and oriented. Affect is normal and appropriate. Clear thought process with normal reasoning and conversational tone and logic. No psychomotor retardation. Not in any acute distress  Skin: no rash or abnormal lesions  Eyes - normal eye movements.  Pupils equal and reactive to light  Eyelid normal on exam  Ears - TM clear, external ear normal  Nose - no acute changes  Throat - Oral mucosa moist and pink.  Neck- LEFT SIDE neck thyroid nodule, nontender, no palpable cervical lymph nodes  HEART: S1S2 heard, regular rate and rhythm  LUNGS: good air exchange, no crackles or ronchi or wheezing  ABDOMEN: Positive bowel sounds, no hepatospleenomegaly appreciated, no tenderness   Back: Normal curvature, no tenderness.   Extremities: FROM, no deformities, no edema, no erythema.   No knee effusion.   mild tendernesson the joint line.  Neuro: Physiological, speech better than before.   Right side weakness.   Restricted and only able to abduct <20%, Right leg weakness.   Strength RUE and RLE 3/5.        Psych-good eye contact  Train of thought normal  Judgment normal      Lab Results   Component Value Date    WBC 7.97 07/14/2021    HGB 13.0 07/14/2021    HCT 41.8 07/14/2021    PLT 140 (L) 07/14/2021    CHOL 150 07/12/2021    TRIG 145 07/12/2021    HDL 42 07/12/2021    LDL 79 07/12/2021    ALT 12 07/14/2021    AST 17 07/14/2021    NA 139 07/14/2021    K 4.2 07/14/2021    CL 103 07/14/2021    CREAT 0.8 07/14/2021    BUN 13.0 07/14/2021    CO2 28 07/14/2021    TSH 2.63 12/30/2020    INR 1.0 07/14/2021    GLU 237 (H) 07/14/2021    HGBA1C 8.7 (H) 07/12/2021         ASSESSMENT AND PLAN:  1. Essential hypertension    2.  Uncontrolled type 2 diabetes mellitus with hyperglycemia    3. Anxiety  - Ambulatory referral to Behavioral Health  - Ambulatory referral to Psychiatry    4. History of bladder suspension procedure  - Ambulatory referral to Urogynecology    5. Urinary anomaly  - Ambulatory referral to Urogynecology         Need anxiety  better controlled  Administrator, sports  She is being referred to new psychiatrist    C/o urination issue-   Hx bladder sling  No s/s UTI    HTN - chronic  Continuing verapamil  Pt to fu closely with cardio  Anxiety making it worse    DM- fu with endo  FOLLOW-UP:  No follow-ups on file.      This note was generated by the [employer] EMR system/ Dragon speech recognition and may contain inherent errors or omissions not intended by the user. Grammatical errors, random word insertions, deletions, pronoun errors and incomplete sentences are occasional consequences of this technology due to software limitations. Not all errors are caught or corrected. If there are questions or concerns about the content of this note or information contained within the body of this dictation they should be addressed directly with the author for clarification      Brandi Tomlinson Bernadene Person, MD

## 2021-08-05 ENCOUNTER — Inpatient Hospital Stay (INDEPENDENT_AMBULATORY_CARE_PROVIDER_SITE_OTHER): Payer: Medicare Other | Admitting: Family Medicine

## 2021-08-05 NOTE — Progress Notes (Signed)
[facility] OFF[facility] PROGRESS NOTE    [facility] OFFICE -CARDIOLOGY  [address]  [place] [address]  Dept: [phone]  Dept Fax: [phone]       Patient Name: Emily Rowe, Emily Rowe    Date of Visit:  August 06, 2021  Date of Birth: [birth year]  AGE: 70 y.o.  Medical Record #: [id number]  Requesting Physician: Rebecca Eaton, MD      CHIEF COMPLAINT: Follow-up      HISTORY OF PRESENT ILLNESS:    She is a pleasant 70 y.o. female who presents today for 2-week follow-up.  Patient states finally her blood pressures are better.  She only took the nifedipine for 3 days because she started having headaches and nausea and faster heart rates off the verapamil.  She went back to the verapamil.  Instead of taking 1 tablet a day she has been taking 1-1/2 of the 120 mg dose.  Blood pressures have finally been under better control.  They have been in the 110s/80s.  Her primary care doctor said they think her anxiety is worsening her blood pressures, but she is not sure.  She still has a lot of stress.  She has been having panic attacks twice a day.  She is in counseling, but may need to take a trip out of country as her sister is dying in [place].  She notes her blood pressure is higher today because she fell asleep this afternoon and ran to get here on time.      PAST MEDICAL HISTORY: She has a past medical history of Anxiety, Asthma, Bowel perforation, Depression, Diabetes, Difficulty walking, Dysphagia, Gastroesophageal reflux disease, Hyperlipemia, Hypertensive disorder, Hypokalemia, Neuropathy, Post-operative nausea and vomiting, Sleep apnea (2017), Stroke (2012), TIA (transient ischemic attack), and Type 2 diabetes mellitus, controlled (2021). She has a past surgical history that includes Tonsillectomy; Reduction mammaplasty; urinary bladder; Colonoscopy; Colonoscopy (N/A, 09/17/2020); EGD (N/A, 10/28/2020); Hysterectomy; Oophorectomy (Bilateral); and  Breast biopsy (Left).    ALLERGIES:   Allergies   Allergen Reactions    Codeine Nausea And Vomiting    Crestor [Rosuvastatin] Rash     D/c due to severe rash and higher blood sugars    Glucosamine Hives and Nausea And Vomiting    Latex Rash    Tree Nuts Hives     ESPECIALLY ALMONDS     Dairy [Milk-Related Compounds]     Eggs Or Egg-Derived Products      Patient had an allergy test and stated that she was allergic to eggs    Gabapentin      Dizziness and rash, irregular heart rate    Gluten Meal     Invokana [Canagliflozin]     Levemir [Insulin Detemir] Nausea And Vomiting    Lyrica [Pregabalin]      PT STATES IT MAKES HER NEUROPATHY WORSE STATES SHE FEELS LIKE SHE IS ON FIRE    Metformin      diarrhea    Sucralfate Nausea And Vomiting     BODY ACHES, FEVER    Evaristo Bury [Insulin Degludec]     Victoza [Liraglutide] Nausea And Vomiting    Flaxseed [Linseed Oil] Rash    Omega 3 [Fish Oil] Rash    Peanut-Containing Drug Products Rash       MEDICATIONS:   Patient's current medications were reviewed. ONLY Cardiac medications were updated unless others were addressed in assessment and plan.    Current Outpatient Medications:     aspirin  81 MG chewable tablet, Chew 81 mg by mouth twice a week, Disp: , Rfl:     evolocumab (REPATHA) 140 MG/ML subcutaneous auto-injector, Inject 1 mL (140 mg total) into the skin every 14 (fourteen) days, Disp: 6 pen, Rfl: 3    glipiZIDE (GLUCOTROL) 10 MG 24 hr tablet, Take 1 tablet (10 mg total) by mouth 2 (two) times daily (Patient taking differently: Take 10 mg by mouth daily), Disp: 180 tablet, Rfl: 0    glucose blood test strip, Check sugar three times a day, ICD 10 E11.9, Disp: 100 each, Rfl: 10    insulin glargine (LANTUS SOLOSTAR) 100 UNIT/ML injection pen, Inject 10 units into skin daily at bedtime ONLY AS NEEDED IF BLOOD SUGAR >OR = TO 170 LAST TAKEN 07/2020, Disp: 15 mL, Rfl: 1    linaGLIPtin (Tradjenta) 5 MG Tab, Take 1 tablet (5 mg total) by mouth daily, Disp: 90 tablet, Rfl: 3     LORazepam (ATIVAN) 0.5 MG tablet, Take 0.5 tablets (0.25 mg total) by mouth nightly as needed for Anxiety, Disp: 20 tablet, Rfl: 0    losartan (COZAAR) 50 MG tablet, TAKE 1 TABLET(50 MG) BY MOUTH TWICE DAILY (Patient taking differently: Take 100 mg by mouth daily), Disp: 180 tablet, Rfl: 2    sertraline (Zoloft) 50 MG tablet, Half tab po qd 1 to 2 weeks and then one tab daily (Patient taking differently: Half tab po qd 1 to 2 weeks and then one tab daily), Disp: 30 tablet, Rfl: 1    verapamil ER (CALAN-SR) 180 MG CR tablet, Take 1 tablet (180 mg total) by mouth daily, Disp: 90 tablet, Rfl: 3     FAMILY HISTORY: family history includes Breast cancer (age of onset: 65) in her daughter; Diabetes in her maternal grandmother and mother; Heart attack in her mother; Stroke in her maternal grandfather and maternal grandmother.    SOCIAL HISTORY: She reports that she has never smoked. She has never used smokeless tobacco.    PHYSICAL EXAMINATION    Visit Vitals  BP 144/90 (BP Site: Left arm, Patient Position: Sitting, Cuff Size: Large)   Pulse 66   Ht 1.651 m (5\' 5" )   Wt 93.9 kg (207 lb)   BMI 34.45 kg/m       General Appearance:  Cooperative, alert and oriented, in no acute distress.    Skin: Warm and dry to touch, no apparent skin lesions.  Head: Normocephalic, normal hair pattern.   Eyes: conjunctivae and lids unremarkable.  ENT: Wearing facemask. No pallor or cyanosis.  Neck: JVP normal, no carotid bruit.  Chest: Clear to auscultation bilaterally with good air movement and respiratory effort and no wheezes, rales, or rhonchi   Cardiovascular: Regular rhythm, S1 normal, S2 normal, No S3 or S4, Apical impulse not displaced, no murmurs, gallops or rubs detected   Abdomen: Soft, nontender, and nondistended.   Extremities: Warm.  No edema bilaterally. Radial pulses 2+ bilaterally.  Neuro: Alert and oriented x3. No gross motor or sensory deficits noted, affect appropriate. Normal memory.    BP Readings from Last 3  Encounters:   08/06/21 144/90   07/26/21 157/87   07/20/21 (!) 180/100     Wt Readings from Last 3 Encounters:   08/06/21 93.9 kg (207 lb)   07/26/21 93.9 kg (207 lb)   07/20/21 92.1 kg (203 lb)     LABS:   Lab Results   Component Value Date    WBC 7.97 07/14/2021    HGB 13.0 07/14/2021  HCT 41.8 07/14/2021    PLT 140 (L) 07/14/2021     Lab Results   Component Value Date    GLU 237 (H) 07/14/2021    BUN 13.0 07/14/2021    CREAT 0.8 07/14/2021    NA 139 07/14/2021    K 4.2 07/14/2021    CL 103 07/14/2021    CO2 28 07/14/2021    AST 17 07/14/2021    ALT 12 07/14/2021     Lab Results   Component Value Date    TSH 2.63 12/30/2020    HGBA1C 8.7 (H) 07/12/2021     Lab Results   Component Value Date    CHOL 150 07/12/2021    TRIG 145 07/12/2021    HDL 42 07/12/2021    LDL 79 07/12/2021           IMPRESSION:   1.         Hypertension, finally under decent control at home, 110/80s.                          a.         Intolerances w/ toprol XL, lisinopril, and chlorthalidone and now coreg (elevated BS). Had worsening headaches, nausea, and faster heart rates on the nifedipine.  2.         Previous palpitations with unremarkable Body Guardian monitor 05/2017  3.         Hyperlipidemia.                          Intolerant to statins                          LDL less than 70 on PCSK9 inhibitor  4.         Obesity.   5.         Structurally normal heart by echocardiogram in 7/255/22, EF 70% on limited definity echo 05/17/21.   6.         Insulin-dependent diabetes  7.         CVA in February 2012.  8.         Patient reports she had sleep study in past and was not recommended for CPAP.  9.         Psychosocial stressors, panic attacks, anxiety  10.       Chronic dyspnea  11.       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     RECOMMENDATIONS:   We will go ahead and have the patient continue with the verapamil 180 mg daily.  I will consolidate, and 1/2 tablets of the 120 and have her take  a 180 mg tablet.  She plans to go to the country to [place] for short trip hopefully to see her sister with cancer before she passes away.    Continue monitoring home blood pressures and log.  Follow-up with [place] again in about 3 to 4 months or sooner if needed.                                               Orders Placed This Encounter   Procedures    Office Visit ([facility])     Orders Placed This Encounter  Medications    verapamil ER (CALAN-SR) 180 MG CR tablet     Sig: Take 1 tablet (180 mg total) by mouth daily     Dispense:  90 tablet     Refill:  3         SIGNED:    Rosalia Hammers, PA          This note was generated by the Dragon speech recognition and may contain errors or omissions not intended by the user. Grammatical errors, random word insertions, deletions, pronoun errors, and incomplete sentences are occasional consequences of this technology due to software limitations. Not all errors are caught or corrected. If there are questions or concerns about the content of this note or information contained within the body of this dictation, they should be addressed directly with the author for clarification.

## 2021-08-06 ENCOUNTER — Ambulatory Visit (INDEPENDENT_AMBULATORY_CARE_PROVIDER_SITE_OTHER): Payer: Medicare Other | Admitting: Physician Assistant

## 2021-08-06 ENCOUNTER — Encounter (INDEPENDENT_AMBULATORY_CARE_PROVIDER_SITE_OTHER): Payer: Self-pay | Admitting: Physician Assistant

## 2021-08-06 VITALS — BP 144/90 | HR 66 | Ht 65.0 in | Wt 207.0 lb

## 2021-08-06 DIAGNOSIS — E669 Obesity, unspecified: Secondary | ICD-10-CM

## 2021-08-06 DIAGNOSIS — I779 Disorder of arteries and arterioles, unspecified: Secondary | ICD-10-CM

## 2021-08-06 DIAGNOSIS — E1165 Type 2 diabetes mellitus with hyperglycemia: Secondary | ICD-10-CM

## 2021-08-06 DIAGNOSIS — I10 Essential (primary) hypertension: Secondary | ICD-10-CM

## 2021-08-06 DIAGNOSIS — Z8673 Personal history of transient ischemic attack (TIA), and cerebral infarction without residual deficits: Secondary | ICD-10-CM

## 2021-08-06 DIAGNOSIS — E782 Mixed hyperlipidemia: Secondary | ICD-10-CM

## 2021-08-06 MED ORDER — VERAPAMIL HCL ER 180 MG PO TBCR
180.0000 mg | EXTENDED_RELEASE_TABLET | Freq: Every day | ORAL | 3 refills | Status: DC
Start: 2021-08-06 — End: 2021-11-04

## 2021-08-13 ENCOUNTER — Telehealth: Payer: Self-pay | Admitting: Internal Medicine

## 2021-08-13 ENCOUNTER — Ambulatory Visit (INDEPENDENT_AMBULATORY_CARE_PROVIDER_SITE_OTHER): Payer: Medicare Other | Admitting: Family Medicine

## 2021-08-17 ENCOUNTER — Telehealth (INDEPENDENT_AMBULATORY_CARE_PROVIDER_SITE_OTHER): Payer: Self-pay

## 2021-08-17 NOTE — Telephone Encounter (Signed)
Recommend scheduling f/u with Sarah within the next 2 weeks. Thanks

## 2021-08-17 NOTE — Telephone Encounter (Signed)
Please call to schedule f/u with this patient per Dr Binnie Rail Rao's recommendation.    Please schedule [facility] or [facility] per patient preference.    Thanks  Michelene Heady PA

## 2021-08-17 NOTE — Telephone Encounter (Signed)
Left voice mail for patient to schedule a new patient appointment with Michelene Heady.

## 2021-08-24 ENCOUNTER — Encounter (INDEPENDENT_AMBULATORY_CARE_PROVIDER_SITE_OTHER): Payer: Self-pay

## 2021-08-25 ENCOUNTER — Encounter (INDEPENDENT_AMBULATORY_CARE_PROVIDER_SITE_OTHER): Payer: Self-pay

## 2021-09-02 ENCOUNTER — Encounter (INDEPENDENT_AMBULATORY_CARE_PROVIDER_SITE_OTHER): Payer: Self-pay

## 2021-09-17 ENCOUNTER — Ambulatory Visit (HOSPITAL_BASED_OUTPATIENT_CLINIC_OR_DEPARTMENT_OTHER): Payer: Medicare Other | Admitting: Registered"

## 2021-09-23 ENCOUNTER — Encounter (INDEPENDENT_AMBULATORY_CARE_PROVIDER_SITE_OTHER): Payer: Self-pay

## 2021-09-24 ENCOUNTER — Encounter (INDEPENDENT_AMBULATORY_CARE_PROVIDER_SITE_OTHER): Payer: Self-pay

## 2021-10-24 ENCOUNTER — Encounter (INDEPENDENT_AMBULATORY_CARE_PROVIDER_SITE_OTHER): Payer: Self-pay

## 2021-10-25 ENCOUNTER — Encounter (INDEPENDENT_AMBULATORY_CARE_PROVIDER_SITE_OTHER): Payer: Self-pay

## 2021-10-25 ENCOUNTER — Telehealth: Payer: Self-pay | Admitting: Internal Medicine

## 2021-10-25 DIAGNOSIS — E1165 Type 2 diabetes mellitus with hyperglycemia: Secondary | ICD-10-CM

## 2021-10-25 NOTE — Telephone Encounter (Signed)
Name, strength, directions of requested refill(s):  glipiZIDE (GLUCOTROL) 10 MG 24 hr tablet (Order [id number])      Pharmacy to send refill to or patient to pick up rx from office (mark requested pharmacy in BOLD):    [email or web address] - [place], [place] - [address]  [address]  [place] [address]  Phone: [phone] Fax: [phone]        Please mark "X" next to the preferred call back number:    Mobile: X  Telephone Information:   Mobile [phone]       Home: @HOMEPHONE @    Work: @WORKPHONE @        Medication refill request , see above. Thank you     Additional Notes:     Next Visit:    11/10/2021

## 2021-10-26 MED ORDER — GLIPIZIDE ER 10 MG PO TB24
10.0000 mg | ORAL_TABLET | Freq: Two times a day (BID) | ORAL | 0 refills | Status: DC
Start: 2021-10-26 — End: 2021-10-28

## 2021-10-26 NOTE — Telephone Encounter (Signed)
Short term refill glipizide sent    Next visit with Dr Maeola Harman 11/10/21

## 2021-10-27 ENCOUNTER — Other Ambulatory Visit (INDEPENDENT_AMBULATORY_CARE_PROVIDER_SITE_OTHER): Payer: Self-pay | Admitting: Family Medicine

## 2021-10-27 DIAGNOSIS — I10 Essential (primary) hypertension: Secondary | ICD-10-CM

## 2021-10-28 ENCOUNTER — Other Ambulatory Visit (INDEPENDENT_AMBULATORY_CARE_PROVIDER_SITE_OTHER): Payer: Self-pay | Admitting: Cardiovascular Disease

## 2021-10-28 DIAGNOSIS — I10 Essential (primary) hypertension: Secondary | ICD-10-CM

## 2021-10-28 MED ORDER — GLIPIZIDE ER 10 MG PO TB24
10.0000 mg | ORAL_TABLET | Freq: Two times a day (BID) | ORAL | 0 refills | Status: DC
Start: 2021-10-28 — End: 2021-11-17

## 2021-10-28 NOTE — Addendum Note (Signed)
Addended by: Michelene Heady on: 10/28/2021 12:02 PM     Modules accepted: Orders

## 2021-11-02 ENCOUNTER — Encounter (INDEPENDENT_AMBULATORY_CARE_PROVIDER_SITE_OTHER): Payer: Self-pay | Admitting: Family Medicine

## 2021-11-02 ENCOUNTER — Ambulatory Visit (INDEPENDENT_AMBULATORY_CARE_PROVIDER_SITE_OTHER): Payer: Medicare Other | Admitting: Family Medicine

## 2021-11-02 VITALS — BP 147/87 | HR 77 | Temp 97.6°F | Wt 214.0 lb

## 2021-11-02 DIAGNOSIS — E1142 Type 2 diabetes mellitus with diabetic polyneuropathy: Secondary | ICD-10-CM

## 2021-11-02 DIAGNOSIS — I69359 Hemiplegia and hemiparesis following cerebral infarction affecting unspecified side: Secondary | ICD-10-CM

## 2021-11-02 DIAGNOSIS — K862 Cyst of pancreas: Secondary | ICD-10-CM

## 2021-11-02 DIAGNOSIS — I779 Disorder of arteries and arterioles, unspecified: Secondary | ICD-10-CM | POA: Insufficient documentation

## 2021-11-02 DIAGNOSIS — Z1231 Encounter for screening mammogram for malignant neoplasm of breast: Secondary | ICD-10-CM

## 2021-11-02 DIAGNOSIS — M797 Fibromyalgia: Secondary | ICD-10-CM | POA: Insufficient documentation

## 2021-11-02 DIAGNOSIS — Z23 Encounter for immunization: Secondary | ICD-10-CM

## 2021-11-02 DIAGNOSIS — E1165 Type 2 diabetes mellitus with hyperglycemia: Secondary | ICD-10-CM

## 2021-11-02 LAB — COMPREHENSIVE METABOLIC PANEL
ALT: 11 U/L (ref 0–55)
AST (SGOT): 15 U/L (ref 5–41)
Albumin/Globulin Ratio: 1.2 (ref 0.9–2.2)
Albumin: 3.6 g/dL (ref 3.5–5.0)
Alkaline Phosphatase: 86 U/L (ref 37–117)
Anion Gap: 10 (ref 5.0–15.0)
BUN: 15 mg/dL (ref 7.0–21.0)
Bilirubin, Total: 0.3 mg/dL (ref 0.2–1.2)
CO2: 26 mEq/L (ref 17–29)
Calcium: 8.6 mg/dL (ref 8.5–10.5)
Chloride: 103 mEq/L (ref 99–111)
Creatinine: 0.8 mg/dL (ref 0.4–1.0)
Globulin: 3 g/dL (ref 2.0–3.6)
Glucose: 167 mg/dL — ABNORMAL HIGH (ref 70–100)
Potassium: 4.2 mEq/L (ref 3.5–5.3)
Protein, Total: 6.6 g/dL (ref 6.0–8.3)
Sodium: 139 mEq/L (ref 135–145)

## 2021-11-02 LAB — HEMOGLOBIN A1C
Average Estimated Glucose: 203 mg/dL
Hemoglobin A1C: 8.7 % — ABNORMAL HIGH (ref 4.6–5.9)

## 2021-11-02 LAB — LIPID PANEL
Cholesterol / HDL Ratio: 5.7 Index
Cholesterol: 216 mg/dL — ABNORMAL HIGH (ref 0–199)
HDL: 38 mg/dL — ABNORMAL LOW (ref 40–9999)
LDL Calculated: 144 mg/dL — ABNORMAL HIGH (ref 0–99)
Triglycerides: 168 mg/dL — ABNORMAL HIGH (ref 34–149)
VLDL Calculated: 34 mg/dL (ref 10–40)

## 2021-11-02 LAB — MICROALBUMIN, RANDOM URINE
Urine Creatinine, Random: 85.4 mg/dL
Urine Microalbumin, Random: <5 ug/ml (ref 0.0–30.0)

## 2021-11-02 LAB — HEMOLYSIS INDEX: Hemolysis Index: 12 Index (ref 0–24)

## 2021-11-02 LAB — GFR: EGFR: >60

## 2021-11-02 NOTE — Progress Notes (Signed)
Have you seen any specialists since your last visit with [place]?  No      The patient was informed that the following HM items are still outstanding:   Health Maintenance Due   Topic Date Due    Advance Directive on File  Never done    Shingrix Vaccine 50+ (1) Never done    Medicare Annual Wellness Visit  09/04/2020    COVID-19 Vaccine (4 - Booster for Moderna series) 11/22/2020    DXA Scan  09/23/2021    MAMMOGRAM  11/04/2021     Date: 11/02/2021 8:48 AM   Patient ID: Emily Rowe is a 71 y.o. female.    Chief Complaint:  Chief Complaint   Patient presents with    Diabetes     Patient fasting.       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    Current Medications:  Outpatient Medications Marked as Taking for the 11/02/21 encounter (Office Visit) with Rebecca Eaton, MD   Medication Sig Dispense Refill    aspirin 81 MG chewable tablet Chew 81 mg by mouth twice a week      glipiZIDE (GLUCOTROL) 10 MG 24 hr tablet Take 1 tablet (10 mg) by mouth 2 (two) times daily (Patient taking differently: Take 10 mg by mouth daily 2 pills daily) 60 tablet 0    glucose blood test strip Check sugar three times a day, ICD 10 E11.9 100 each 10    insulin glargine (LANTUS SOLOSTAR) 100 UNIT/ML injection pen Inject 10 units into skin daily at bedtime ONLY AS NEEDED IF BLOOD SUGAR >OR = TO 170  LAST TAKEN 07/2020 15 mL 1     linaGLIPtin (Tradjenta) 5 MG Tab Take 1 tablet (5 mg total) by mouth daily 90 tablet 3    LORazepam (ATIVAN) 0.5 MG tablet Take 0.5 tablets (0.25 mg total) by mouth nightly as needed for Anxiety 20 tablet 0    losartan (COZAAR) 50 MG tablet TAKE 1 TABLET(50 MG) BY MOUTH TWICE DAILY 60 tablet 3    verapamil ER (CALAN-SR) 180 MG CR tablet Take 1 tablet (180 mg total) by mouth daily (Patient taking differently: Take 120 mg by mouth daily) 90 tablet 3       Past Medical History:  Past Medical History:   Diagnosis Date    Anxiety     Asthma     allergic reaction to mold, not since 20 years, HAS WHEEZING IN COLD WEATHER. NO INHALERS    Bowel perforation     COLONOSCOPY 09/2019    Depression     Diabetes     Difficulty walking     WEAK RIGHT LEG AFTER CVA 2021    Dysphagia     BIG PILLS ONLY  Gastroesophageal reflux disease     PREVACID OTC PRN     Hyperlipemia     Hypertensive disorder     Hypokalemia     CMP REQ PCP 09/14/20    Neuropathy     NUMBNESS RIGHT UE AND LE     Post-operative nausea and vomiting     ALWAYS WITH ANESTHESIA     Sleep apnea 2017    NO CPAP NEEDED PER PT     Stroke 2012     ARM, LEG WEAK. WALKS WITH CANE, WALKER    TIA (transient ischemic attack)     MULTIPLE BEFORE CVA 2012    Type 2 diabetes mellitus, controlled 2021    MANAGED BY DR Binnie Rail RAO. AM FSB 117-140. TAKES LANTUS PRN IN AM. HAIC 7.9       Past Surgical History:  Past Surgical History:   Procedure Laterality Date    BREAST BIOPSY Left     benign lump biopsy    COLONOSCOPY      X 2    COLONOSCOPY N/A 09/17/2020    Procedure: COLONOSCOPY;  Surgeon: Edrick Oh, MD;  Location: [facility];  Service: Gastroenterology;  Laterality: N/A;    EGD N/A 10/28/2020    Procedure: EGD w/ bx's;  Surgeon: Philbert Riser, MD;  Location: [facility];  Service: Gastroenterology;  Laterality: N/A;    HYSTERECTOMY      2005    OOPHORECTOMY Bilateral     2005    REDUCTION MAMMAPLASTY      TONSILLECTOMY      urinary bladder       BLADDER LIFT        Family History:  Family History   Problem Relation Age of Onset    Diabetes Mother     Heart attack Mother     Diabetes Maternal Grandmother     Stroke Maternal Grandmother     Breast cancer Daughter 30    Stroke Maternal Grandfather        Social History:   reports that she has never smoked. She has never used smokeless tobacco.    Allergies:  Allergies   Allergen Reactions    Codeine Nausea And Vomiting    Crestor [Rosuvastatin] Rash     D/c due to severe rash and higher blood sugars    Glucosamine Hives and Nausea And Vomiting    Latex Rash    Tree Nuts Hives     ESPECIALLY ALMONDS     Dairy [Milk-Related Compounds]     Eggs Or Egg-Derived Products      Patient had an allergy test and stated that she was allergic to eggs    Gabapentin      Dizziness and rash, irregular heart rate    Gluten Meal     Invokana [Canagliflozin]     Levemir [Insulin Detemir] Nausea And Vomiting    Lyrica [Pregabalin]      PT STATES IT MAKES HER NEUROPATHY WORSE STATES SHE FEELS LIKE SHE IS ON FIRE    Metformin      diarrhea    Sucralfate Nausea And Vomiting     BODY ACHES, FEVER    Evaristo Bury [Insulin Degludec]     Victoza [Liraglutide] Nausea And Vomiting    Flaxseed [Linseed Oil] Rash    Omega 3 [Fish Oil] Rash    Peanut-Containing Drug Products Rash        The following sections were reviewed this encounter by the provider:  Tobacco  Allergies  Meds  Problems  Med Hx  Surg Hx  Fam Hx         VITALS:  Vitals:    11/02/21 0808   BP: 147/87   BP Site: Left arm   Patient Position: Sitting   Cuff Size: Large   Pulse: 77   Temp: 97.6 F (36.4 C)   TempSrc: Oral   SpO2: 96%   Weight: 97.1 kg (214 lb)      ROS:  General/Constitutional:   Denies Chills. Denies Fatigue. Denies Fever.   Ophthalmologic:   Denies Blurred vision. Denies Eye Pain.   ENT:   Denies Nasal Discharge. Denies Ear pain. Denies Sinus pain.   Endocrine:   Denies Polydipsia. Denies Polyuria.   Respiratory:   Denies Cough. Denies Orthopnea. Denies  Shortness of breath. Denies Wheezing.   Cardiovascular:   Denies Chest pain. Denies Chest pain with exertion. Denies Leg Claudication. Denies      Palpitations. Denies Swelling in hands/feet.   Gastrointestinal:   Denies Abdominal pain. Denies Blood in stool. Denies Constipation. Denies Diarrhea.      Denies Heartburn. Denies Nausea. Denies Vomiting.   Genitourinary:   Denies Blood in urine. Denies Frequent urination. Denies Painful urination.   Musculoskeletal:   + Leg cramps. + Muscle aches.   Skin:   Denies Skin lesion(s).   Neurologic:   Denies Dizziness. +Gait abnormality. Denies Headache. +  Tingling/Numbness.      Physical Exam:  Alert and oriented. Affect is normal and appropriate. Clear thought process with normal reasoning and conversational tone and logic. No psychomotor retardation. Not in any acute distress  Skin: no rash or abnormal lesions  Eyes - normal eye movements.  Pupils equal and reactive to light  Eyelid normal on exam  Ears - TM clear, external ear normal  Nose - no acute changes  Throat - Oral mucosa moist and pink.  Neck- LEFT SIDE neck thyroid nodule, nontender, no palpable cervical lymph nodes  HEART: S1S2 heard, regular rate and rhythm  LUNGS: good air exchange, no crackles or ronchi or wheezing  ABDOMEN: Positive bowel sounds, no hepatospleenomegaly appreciated, no tenderness   Back: Normal curvature, no tenderness.   Extremities: FROM, no deformities, no edema, no erythema.   No knee effusion.   mild tendernesson the joint line.  Neuro: Physiological, speech better than before.   Right side weakness.   Restricted and only able to abduct <20%, Right leg weakness.   Strength RUE and RLE 3/5.        Psych-good eye contact  Train of thought normal  Judgment normal      Lab Results   Component Value Date    WBC 7.97 07/14/2021    HGB 13.0 07/14/2021    HCT 41.8 07/14/2021    PLT 140 (L) 07/14/2021    CHOL 150 07/12/2021    TRIG 145 07/12/2021    HDL 42 07/12/2021    LDL 79 07/12/2021    ALT  12 07/14/2021    AST 17 07/14/2021    NA 139 07/14/2021    K 4.2 07/14/2021    CL 103 07/14/2021    CREAT 0.8 07/14/2021    BUN 13.0 07/14/2021    CO2 28 07/14/2021    TSH 2.63 12/30/2020    INR 1.0 07/14/2021    GLU 237 (H) 07/14/2021    HGBA1C 8.7 (H) 07/12/2021         ASSESSMENT AND PLAN:  1. Uncontrolled type 2  diabetes mellitus with hyperglycemia  - Comprehensive metabolic panel  - Hemoglobin A1C  - Lipid panel  - Urine Microalbumin Random    2. Bilateral carotid artery disease, unspecified type    3. History of hemorrhagic stroke with residual hemiparesis  - Ambulatory referral to Rheumatology  - Comprehensive metabolic panel  - Hemoglobin A1C  - Lipid panel  - Urine Microalbumin Random    4. Fibromyalgia muscle pain  - Ambulatory referral to Rheumatology    5. Diabetic peripheral neuropathy    6. Pancreas cyst    7. Breast cancer screening by mammogram  - Mammo Digital 2D Screening Bilateral W CAD; Future    8. Need for vaccination  - Flu vaccine HIGH-DOSE QUAD (PF) 65 yrs and older           anxiety better controlled  Seeing counsellor  Feels no need for medication      Chronic pain -still present  Possible fibnromyalgia- seen Rheum 1-2 yrs ago  did not take med prescribed  Will refer again    Panc cyst - being monitored  [place]  set appt with GI- pt will call    HTN - chronic  Continuing verapamil  Pt to fu closely with cardio  Anxiety making it worse    DM- fu with endo  Labs today  Seeing retina specialist    Reprinted neck US- told to schedule      Family history with daughter diagnosed with breast cancer  She is due for screening mammogram-order has been placed        FOLLOW-UP:  Return in about 3 months (around 01/31/2022) for Medicare AWV.      This note was generated by the Epic EMR system/ Dragon speech recognition and may contain inherent errors or omissions not intended by the user. Grammatical errors, random word insertions, deletions, pronoun errors and incomplete sentences are occasional  consequences of this technology due to software limitations. Not all errors are caught or corrected. If there are questions or concerns about the content of this note or information contained within the body of this dictation they should be addressed directly with the author for clarification      Kelcy Laible Bernadene Person, MD

## 2021-11-04 ENCOUNTER — Encounter (INDEPENDENT_AMBULATORY_CARE_PROVIDER_SITE_OTHER): Payer: Self-pay | Admitting: Cardiovascular Disease

## 2021-11-04 ENCOUNTER — Ambulatory Visit (INDEPENDENT_AMBULATORY_CARE_PROVIDER_SITE_OTHER): Payer: Medicare Other | Admitting: Cardiovascular Disease

## 2021-11-04 VITALS — BP 140/83 | HR 71 | Ht 65.0 in | Wt 207.0 lb

## 2021-11-04 DIAGNOSIS — E1165 Type 2 diabetes mellitus with hyperglycemia: Secondary | ICD-10-CM

## 2021-11-04 DIAGNOSIS — I10 Essential (primary) hypertension: Secondary | ICD-10-CM

## 2021-11-04 DIAGNOSIS — I69359 Hemiplegia and hemiparesis following cerebral infarction affecting unspecified side: Secondary | ICD-10-CM

## 2021-11-04 DIAGNOSIS — E782 Mixed hyperlipidemia: Secondary | ICD-10-CM

## 2021-11-04 DIAGNOSIS — I779 Disorder of arteries and arterioles, unspecified: Secondary | ICD-10-CM

## 2021-11-04 DIAGNOSIS — Z8673 Personal history of transient ischemic attack (TIA), and cerebral infarction without residual deficits: Secondary | ICD-10-CM

## 2021-11-04 DIAGNOSIS — E669 Obesity, unspecified: Secondary | ICD-10-CM

## 2021-11-04 MED ORDER — VERAPAMIL HCL ER 120 MG PO TBCR
120.0000 mg | EXTENDED_RELEASE_TABLET | Freq: Every day | ORAL | 3 refills | Status: DC
Start: 2021-11-04 — End: 2022-10-06

## 2021-11-04 NOTE — Progress Notes (Signed)
[facility] OFFICE PROGRESS NOTE    HRT [facility] OFFICE -CARDIOLOGY  [address]  [place] [address]  Dept: [phone]  Dept Fax: [phone]       Patient Name: Emily Rowe, Emily Rowe    Date of Visit:  November 04, 2021  Date of Birth: [birth year]  AGE: 71 y.o.  Medical Record #: [id number]  Requesting Physician: Rebecca Eaton, MD      CHIEF COMPLAINT: Follow-up and Hypertension (51mo)        HISTORY OF PRESENT ILLNESS:    She is a pleasant 71 y.o. female who presents today for regular follow up.    Overall she is feeling pretty well from a cardiac standpoint.  She does have pain with exercise so she is not exercising frequently.  Her blood pressures at home are in the 120s.  No angina or dyspnea.  Overall quite pleased with how she feels.        PAST MEDICAL HISTORY: She has a past medical history of Anxiety, Asthma, Bowel perforation, Depression, Diabetes, Difficulty walking, Dysphagia, Gastroesophageal reflux disease, Hyperlipemia, Hypertensive disorder, Hypokalemia, Neuropathy, Post-operative nausea and vomiting, Sleep apnea (2017), Stroke (2012), TIA (transient ischemic attack), and Type 2 diabetes mellitus, controlled (2021). She has a past surgical history that includes Tonsillectomy; Reduction mammaplasty; urinary bladder; Colonoscopy; Colonoscopy (N/A, 09/17/2020); EGD (N/A, 10/28/2020); Hysterectomy; Oophorectomy (Bilateral); and Breast biopsy (Left).    ALLERGIES:   Allergies   Allergen Reactions    Codeine Nausea And Vomiting    Crestor [Rosuvastatin] Rash     D/c due to severe rash and higher blood sugars    Glucosamine Hives and Nausea And Vomiting    Latex Rash    Tree Nuts Hives     ESPECIALLY ALMONDS     Dairy [Milk-Related Compounds]     Eggs Or Egg-Derived Products      Patient had an allergy test and stated that she was allergic to eggs    Gabapentin      Dizziness and rash, irregular heart rate    Gluten Meal     Invokana  [Canagliflozin]     Levemir [Insulin Detemir] Nausea And Vomiting    Lyrica [Pregabalin]      PT STATES IT MAKES HER NEUROPATHY WORSE STATES SHE FEELS LIKE SHE IS ON FIRE    Metformin      diarrhea    Sucralfate Nausea And Vomiting     BODY ACHES, FEVER    Evaristo Bury [Insulin Degludec]     Victoza [Liraglutide] Nausea And Vomiting    Flaxseed [Linseed Oil] Rash    Omega 3 [Fish Oil] Rash    Peanut-Containing Drug Products Rash       MEDICATIONS:   Patient's current medications were reviewed. ONLY Cardiac medications were updated unless others were addressed in assessment and plan.    Current Outpatient Medications:     aspirin 81 MG chewable tablet, Chew 81 mg by mouth twice a week, Disp: , Rfl:     glipiZIDE (GLUCOTROL) 10 MG 24 hr tablet, Take 1 tablet (10 mg) by mouth 2 (two) times daily (Patient taking differently: Take 10 mg by mouth daily 2 pills daily), Disp: 60 tablet, Rfl: 0    glucose blood test strip, Check sugar three times a day, ICD 10 E11.9, Disp: 100 each, Rfl: 10    insulin glargine (LANTUS SOLOSTAR) 100 UNIT/ML injection pen, Inject 10 units into skin daily at bedtime ONLY AS NEEDED IF  BLOOD SUGAR >OR = TO 170 LAST TAKEN 07/2020, Disp: 15 mL, Rfl: 1    linaGLIPtin (Tradjenta) 5 MG Tab, Take 1 tablet (5 mg total) by mouth daily, Disp: 90 tablet, Rfl: 3    LORazepam (ATIVAN) 0.5 MG tablet, Take 0.5 tablets (0.25 mg total) by mouth nightly as needed for Anxiety, Disp: 20 tablet, Rfl: 0    losartan (COZAAR) 50 MG tablet, TAKE 1 TABLET(50 MG) BY MOUTH TWICE DAILY, Disp: 60 tablet, Rfl: 3    sertraline (Zoloft) 50 MG tablet, Half tab po qd 1 to 2 weeks and then one tab daily, Disp: 30 tablet, Rfl: 1    evolocumab (REPATHA) 140 MG/ML subcutaneous auto-injector, Inject 1 mL (140 mg total) into the skin every 14 (fourteen) days (Patient not taking: Reported on 11/02/2021), Disp: 6 pen, Rfl: 3    verapamil ER (CALAN-SR) 120 MG CR tablet, Take 1 tablet (120 mg) by mouth daily, Disp: 90 tablet, Rfl: 3     FAMILY  HISTORY: family history includes Breast cancer (age of onset: 62) in her daughter; Diabetes in her maternal grandmother and mother; Heart attack in her mother; Stroke in her maternal grandfather and maternal grandmother.    SOCIAL HISTORY: She reports that she has never smoked. She has never used smokeless tobacco.    PHYSICAL EXAMINATION    Visit Vitals  BP 140/83 (BP Site: Left arm, Patient Position: Sitting, Cuff Size: Large)   Pulse 71   Ht 1.651 m (5\' 5" )   Wt 93.9 kg (207 lb)   BMI 34.45 kg/m       General Appearance:  Cooperative, alert and oriented, in no acute distress.    Skin: Warm and dry to touch, no apparent skin lesions.  Head: Normocephalic, normal hair pattern.   Eyes: conjunctivae and lids unremarkable.  ENT: Wearing facemask. No pallor or cyanosis.  Neck: JVP normal, no carotid bruit.  Chest: Clear to auscultation bilaterally with good air movement and respiratory effort and no wheezes, rales, or rhonchi   Cardiovascular: Regular rhythm, S1 normal, S2 normal, No S3 or S4, Apical impulse not displaced, no murmurs, gallops or rubs detected   Abdomen: Soft, nontender, and nondistended.   Extremities: Warm.  No edema bilaterally. Radial pulses 2+ bilaterally.  Neuro: Alert and oriented x3. No gross motor or sensory deficits noted, affect appropriate. Normal memory.    BP Readings from Last 3 Encounters:   11/04/21 140/83   11/02/21 147/87   08/06/21 144/90     Wt Readings from Last 3 Encounters:   11/04/21 93.9 kg (207 lb)   11/02/21 97.1 kg (214 lb)   08/06/21 93.9 kg (207 lb)     LABS:   Lab Results   Component Value Date    WBC 7.97 07/14/2021    HGB 13.0 07/14/2021    HCT 41.8 07/14/2021    PLT 140 (L) 07/14/2021     Lab Results   Component Value Date    GLU 167 (H) 11/02/2021    BUN 15.0 11/02/2021    CREAT 0.8 11/02/2021    NA 139 11/02/2021    K 4.2 11/02/2021    CL 103 11/02/2021    CO2 26 11/02/2021    AST 15 11/02/2021    ALT 11 11/02/2021     Lab Results   Component Value Date    TSH 2.63  12/30/2020    HGBA1C 8.7 (H) 11/02/2021     Lab Results   Component Value Date  CHOL 216 (H) 11/02/2021    TRIG 168 (H) 11/02/2021    HDL 38 (L) 11/02/2021    LDL 144 (H) 11/02/2021           IMPRESSION:   1.         Hypertension, finally under decent control at home, 110-120s/80s.                          a.         Intolerances w/ toprol XL, lisinopril, and chlorthalidone and now coreg (elevated BS). Had worsening headaches, nausea, and faster heart rates on the nifedipine.  2.         Previous palpitations with unremarkable Body Guardian monitor 05/2017  3.         Hyperlipidemia.                          Intolerant to statins                          LDL improved on PCSK9 inhibitor but stopped by patient due to hyperglycemia  4.         Obesity.   5.         Structurally normal heart by echocardiogram in 7/255/22, EF 70% on limited definity echo 05/17/21.   6.         Insulin-dependent diabetes  7.         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  8.         Patient reports she had sleep study in past and was not recommended for CPAP.  9.         Psychosocial stressors, panic attacks, anxiety  10.       Chronic dyspnea  11.       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     RECOMMENDATIONS:     Patient originally referred to [place] for hypertension which is now under control  Mild carotid disease and no documented CAD with normal echocardiogram  Ideally she would be on lipid-lowering therapy.  She stopped PCSK9 inhibitor due to hyperglycemia.  I will defer to her endocrinologist regarding whether or not it would be safe to restart PCSK9 inhibitor with changes in her diabetes medications  She can follow-up with [place] once yearly.                                               Orders Placed This Encounter   Procedures    OFFICE VISIT 30 MIN (HRT [place])     Orders Placed This Encounter   Medications    verapamil ER (CALAN-SR) 120 MG CR tablet     Sig: Take 1 tablet (120 mg) by mouth daily      Dispense:  90 tablet     Refill:  3         SIGNED:    Belinda Fisher, MD          This note was generated by the Dragon speech recognition and may contain errors or omissions not intended by the user. Grammatical errors, random word insertions, deletions, pronoun errors, and incomplete sentences are occasional consequences of this technology due to software limitations. Not all errors  are caught or corrected. If there are questions or concerns about the content of this note or information contained within the body of this dictation, they should be addressed directly with the author for clarification.

## 2021-11-10 ENCOUNTER — Ambulatory Visit (INDEPENDENT_AMBULATORY_CARE_PROVIDER_SITE_OTHER): Payer: Medicare Other | Admitting: Internal Medicine

## 2021-11-10 ENCOUNTER — Encounter (HOSPITAL_BASED_OUTPATIENT_CLINIC_OR_DEPARTMENT_OTHER): Payer: Self-pay | Admitting: Internal Medicine

## 2021-11-10 VITALS — BP 174/94 | HR 76 | Temp 98.0°F | Wt 214.0 lb

## 2021-11-10 DIAGNOSIS — E669 Obesity, unspecified: Secondary | ICD-10-CM

## 2021-11-10 DIAGNOSIS — E1165 Type 2 diabetes mellitus with hyperglycemia: Secondary | ICD-10-CM

## 2021-11-10 DIAGNOSIS — Z6835 Body mass index (BMI) 35.0-35.9, adult: Secondary | ICD-10-CM

## 2021-11-10 DIAGNOSIS — Z794 Long term (current) use of insulin: Secondary | ICD-10-CM

## 2021-11-10 DIAGNOSIS — I10 Essential (primary) hypertension: Secondary | ICD-10-CM

## 2021-11-10 DIAGNOSIS — E782 Mixed hyperlipidemia: Secondary | ICD-10-CM

## 2021-11-10 LAB — POCT GLUCOSE: Whole Blood Glucose POCT: 229 mg/dL — AB (ref 70–100)

## 2021-11-10 NOTE — Progress Notes (Signed)
Subjective:      Date: 11/10/2021 11:42 AM   Patient ID: Emily Rowe is a 71 y.o. female.    Chief Complaint:  Chief Complaint   Patient presents with    Diabetes     BS:   229         HPI  Visit type: follow-up - type II DM   Diagnosed: over 5 years ago  Type: Insulin requiring  Complications: hyperglycemia  Control: inadequate control  Comorbid illness: hyperlipidemia  Last follow-up: 6 months ago    Diabetes Medication Regimen: Glipizide XL 10 mg daily, Tradjenta 5 mg daily , Lantus 10 units daily (not taking lantus at present)    Unable to tolerate Metformin due to diarrhea, unable to tolerate Victoza due to n/v, unable to tolerate Invokana. Taking potassium pill daily along with eating sweet potato and avocado everyday. Unable to tolerate Levemir due to episodes of nausea and vomiting. Not able to tolerate tresiba.    Home glucose readings:  BG readings range 140-160 mg/dl  Diabetic screening: within the last year, no known retinopathy  Podiatric assessment: admits LE burning/tingling due to hx of stroke   Exercise:  walks occasionally   Diet: moderate compliance with recommended diet; eats more vegetables and protein   Response to therapy: with hemoglobin A1C above goal  A1c trend: improving     Lab Results   Component Value Date    HGBA1C 8.7 (H) 11/02/2021    HGBA1C 8.7 (H) 07/12/2021    HGBA1C 9.1 (H) 04/13/2021     Trend is decreasing  Additional concerns: In addition, pt has hyperlipidemia and is compliant with lipid therapy.  Pt denies side effects of lipid therapy - specifically denies myalgia., In addition, pt has stable essential HTN with no evidence of CHF or proteinuria.  The patient is compliant with anti-hypertensive therapy and denies side effects to therapy.  Pt denies CP, SOB, dizziness, orthopnea, PND or edema.     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     H/o severe hypokalemia which required ER visit; had been off lisinopril at that  time.  Notes allergic reaction soon after taking crestor.      Interval history: Elements of HPI were reviewed and updated. Reports consistent compliance with the listed medications for diabetes, hypertension and hyperlipidemia.  Weight has been stable. Started taking prestiq as recommended by psychiatrist.  Total cholesterol and LDL are high. Patient states that sugars increased after starting on repatha. Off glipizide and tradjenta for over a month and resumed last week. Taking the medications for a month.    ROS:  A complete 12 point ROS was obtained. Pertinent positives and negatives as noted in HPI. All other systems are negative.      Vitals:  BP (!) 174/94 (BP Site: Left arm, Patient Position: Sitting, Cuff Size: Large)    Pulse 76    Temp 98 F (36.7 C) (Temporal)    Wt 97.1 kg (214 lb)    SpO2 95%    BMI 35.61 kg/m    Wt Readings from Last 3 Encounters:   11/10/21 97.1 kg (214 lb)   11/04/21 93.9 kg (207 lb)   11/02/21 97.1 kg (214 lb)      +7 lbs since 1/23    Physical Exam   Constitutional: She is oriented to person, place, and time. She appears well-developed and well-nourished.   HENT:   Head: Normocephalic and atraumatic.  Mouth/Throat: Oropharynx is clear and moist.   Eyes: Conjunctivae and EOM are normal.   Neck: Normal range of motion. No thyromegaly present.   Neurological: She is alert and oriented to person, place, and time.   Psychiatric: She has a normal mood and affect.   Normal sensation on 10g monofilament testing B/L    Assessment and Plan     Diagnoses and plan were reviewed and updated    1. Uncontrolled type 2 diabetes mellitus with hyperglycemia  Lab Results   Component Value Date    HGBA1C 8.7 (H) 11/02/2021    HGBA1C 8.7 (H) 07/12/2021    HGBA1C 9.1 (H) 04/13/2021        Given h/o intolerance to tresiba, toujeo, basaglar. Lantus has been well tolerated. Significant interval improvement in glycemic control on the current medication regimen- Lantus, glipizide, tradjenta. Recommend  compliance with checking BG x2/ day. Recommend consistent compliance with lantus 10 units daily due to interval increase in BG readings.    - Hemoglobin A1C  - Comprehensive metabolic panel  - Lipid panel  - TSH  - Urine Microalbumin Random  - insulin glargine (LANTUS SOLOSTAR) 100 UNIT/ML injection pen; Inject 10 units into skin daily at bedtime  Dispense: 15 mL; Refill: 0    2. Benign hypertension  BP is well controlled on the current meds.    3. Mixed hyperlipidemia  Lab Results   Component Value Date    CHOL 216 (H) 11/02/2021    CHOL 150 07/12/2021    CHOL 132 04/13/2021     Lab Results   Component Value Date    HDL 38 (L) 11/02/2021    HDL 42 07/12/2021    HDL 42 04/13/2021     Lab Results   Component Value Date    LDL 144 (H) 11/02/2021    LDL 79 07/12/2021    LDL 62 04/13/2021     Lab Results   Component Value Date    TRIG 168 (H) 11/02/2021    TRIG 145 07/12/2021    TRIG 139 04/13/2021       On statins; well tolerated.  - Lipid panel    4. Obesity (BMI 30-39.9)  Recommend calorie restricted diet and exercise for weight loss.      5. Body mass index 34.0-34.9, adult    6. Long term insulin use      RTC in 6 months, F/U with Evangeline in 3 months    Tanya Nones MD MPH  Endocrinologist, IMG

## 2021-11-10 NOTE — Progress Notes (Deleted)
Subjective:      Date: 11/10/2021 12:12 PM   Patient ID: Emily Rowe is a 71 y.o. female.    Chief Complaint:  Chief Complaint   Patient presents with    Diabetes     BS:   229         HPI  Visit type: follow-up - type II DM   Diagnosed: over 5 years ago  Type: Insulin requiring  Complications: hyperglycemia  Control: inadequate control  Comorbid illness: hyperlipidemia  Last follow-up: 6 months ago    Diabetes Medication Regimen: Glipizide XL 10 mg daily, Tradjenta 5 mg daily , Lantus 10 units daily     Unable to tolerate Metformin due to diarrhea, unable to tolerate Victoza due to n/v, unable to tolerate Invokana. Taking potassium pill daily along with eating sweet potato and avocado everyday. Unable to tolerate Levemir due to episodes of nausea and vomiting. Not able to tolerate tresiba.    Home glucose readings:  FBG 99-100, 2H PPBG 134-140 mg/dl  Diabetic screening: within the last year, no known retinopathy  Podiatric assessment: admits LE burning/tingling due to hx of stroke   Exercise:  walks occasionally   Diet: moderate compliance with recommended diet; eats more vegetables and protein   Response to therapy: with hemoglobin A1C above goal  A1c trend: improving     Lab Results   Component Value Date    HGBA1C 8.7 (H) 11/02/2021    HGBA1C 8.7 (H) 07/12/2021    HGBA1C 9.1 (H) 04/13/2021     Trend is decreasing  Additional concerns: In addition, pt has hyperlipidemia and is compliant with lipid therapy.  Pt denies side effects of lipid therapy - specifically denies myalgia., In addition, pt has stable essential HTN with no evidence of CHF or proteinuria.  The patient is compliant with anti-hypertensive therapy and denies side effects to therapy.  Pt denies CP, SOB, dizziness, orthopnea, PND or edema.     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     H/o severe hypokalemia which required ER visit; had been off lisinopril at that time.  Notes allergic reaction soon  after taking crestor.      Interval history: Elements of HPI were reviewed and updated. Reports consistent compliance with the listed medications for diabetes, hypertension and hyperlipidemia.  Weight has been stable. Reviewed lab results from 11/21 which showed A1c of 7.9% in the range of fairly well controlled diabetes. Elevated total cholesterol, LDL/ bad cholesterol levels. Unable to tolerate low dose statin.    Started taking prestiq as recommended by psychiatrist. Taking pravachol. Total cholesterol and LDL are high.    ROS:  A complete 12 point ROS was obtained. Pertinent positives and negatives as noted in HPI. All other systems are negative.      Vitals:  BP (!) 174/94 (BP Site: Left arm, Patient Position: Sitting, Cuff Size: Large)    Pulse 76    Temp 98 F (36.7 C) (Temporal)    Wt 97.1 kg (214 lb)    SpO2 95%    BMI 35.61 kg/m    Wt Readings from Last 3 Encounters:   11/10/21 97.1 kg (214 lb)   11/04/21 93.9 kg (207 lb)   11/02/21 97.1 kg (214 lb)          Physical Exam   Constitutional: She is oriented to person, place, and time. She appears well-developed and well-nourished.   HENT:   Head: Normocephalic and  atraumatic.   Mouth/Throat: Oropharynx is clear and moist.   Eyes: Conjunctivae and EOM are normal.   Neck: Normal range of motion. No thyromegaly present.   Neurological: She is alert and oriented to person, place, and time.   Psychiatric: She has a normal mood and affect.   Normal sensation on 10g monofilament testing B/L    Assessment and Plan     Diagnoses and plan were reviewed and updated    1. Uncontrolled type 2 diabetes mellitus with hyperglycemia  Lab Results   Component Value Date    HGBA1C 8.7 (H) 11/02/2021    HGBA1C 8.7 (H) 07/12/2021    HGBA1C 9.1 (H) 04/13/2021        Given h/o intolerance to tresiba, toujeo, basaglar. Lantus has been well tolerated. Significant interval improvement in glycemic control on the current medication regimen- Lantus, glipizide, tradjenta. Recommend  compliance with checking BG x2/ day. Recommend consistent compliance with lantus 10 units daily due to interval increase in BG readings.    - Hemoglobin A1C  - Comprehensive metabolic panel  - Lipid panel  - TSH  - Urine Microalbumin Random  - insulin glargine (LANTUS SOLOSTAR) 100 UNIT/ML injection pen; Inject 10 units into skin daily at bedtime  Dispense: 15 mL; Refill: 0    2. Benign hypertension  BP is well controlled on the current meds.    3. Mixed hyperlipidemia  Lab Results   Component Value Date    CHOL 216 (H) 11/02/2021    CHOL 150 07/12/2021    CHOL 132 04/13/2021     Lab Results   Component Value Date    HDL 38 (L) 11/02/2021    HDL 42 07/12/2021    HDL 42 04/13/2021     Lab Results   Component Value Date    LDL 144 (H) 11/02/2021    LDL 79 07/12/2021    LDL 62 04/13/2021     Lab Results   Component Value Date    TRIG 168 (H) 11/02/2021    TRIG 145 07/12/2021    TRIG 139 04/13/2021       On statins; well tolerated.  - Lipid panel    4. Obesity (BMI 30-39.9)  Recommend calorie restricted diet and exercise for weight loss.      5. Body mass index 34.0-34.9, adult    6. Long term insulin use      RTC in 3-4 months    Tanya Nones MD MPH  Endocrinologist, IMG

## 2021-11-16 ENCOUNTER — Telehealth (HOSPITAL_BASED_OUTPATIENT_CLINIC_OR_DEPARTMENT_OTHER): Payer: Self-pay | Admitting: Internal Medicine

## 2021-11-16 NOTE — Telephone Encounter (Signed)
Name, strength, directions of requested refill(s):  glipiZIDE (GLUCOTROL) 10 MG 24 hr tablet   10/28/21  10/28/22  Michelene Heady D, PA     Take 1 tablet (10 mg) by mouth 2 (two) times daily    Patient taking differently: Take 10 mg by mouth daily 2 pills daily    Notes:  Pt requesting losartan and glipizide as dropped in water. 30 day supply sent. Script was previously sent by specialist.       Pharmacy to send refill to or patient to pick up rx from office (mark requested pharmacy in BOLD):      [id number] [place], [place] - [address] AT [facility] [address]  [address]  [place] [address]  Phone: [phone] Fax: [phone]    [email or web address] - [place], [place] - [address]  [address]  [place] [address]  Phone: [phone] Fax: [phone]        Please mark "X" next to the preferred call back number:    Mobile:   Telephone Information:   Mobile [phone]       Home: @HOMEPHONE @ x   Work: @WORKPHONE @        Medication refill request , see above. Thank you     Additional Notes/How much medication is remaining:     Next Visit:

## 2021-11-17 ENCOUNTER — Other Ambulatory Visit (HOSPITAL_BASED_OUTPATIENT_CLINIC_OR_DEPARTMENT_OTHER): Payer: Self-pay

## 2021-11-17 DIAGNOSIS — E1165 Type 2 diabetes mellitus with hyperglycemia: Secondary | ICD-10-CM

## 2021-11-17 MED ORDER — GLIPIZIDE ER 10 MG PO TB24
10.0000 mg | ORAL_TABLET | Freq: Two times a day (BID) | ORAL | 1 refills | Status: DC
Start: 2021-11-17 — End: 2022-10-06

## 2021-11-17 NOTE — Telephone Encounter (Signed)
Rx request sent. 9:57 am.

## 2021-11-24 ENCOUNTER — Encounter (INDEPENDENT_AMBULATORY_CARE_PROVIDER_SITE_OTHER): Payer: Self-pay

## 2021-12-17 ENCOUNTER — Telehealth (INDEPENDENT_AMBULATORY_CARE_PROVIDER_SITE_OTHER): Payer: Self-pay

## 2021-12-17 NOTE — Telephone Encounter (Signed)
Pt called in as she was supposed to hear from [facility] about her statins      Reviewed chart and pt was to speak w her Endocrine about the safety of going back on PCSK9 inhibitor .      Reviewed endocrine note in chart and this was  NOT addressed .  Note is in Epic      Will message Dr Dimple Casey if he would prefer we reach out to endocrine for  guidance  on safety to restart PCSK9   due to  her hyperglycemia      Willl  contact pt as to plan

## 2021-12-22 ENCOUNTER — Encounter (INDEPENDENT_AMBULATORY_CARE_PROVIDER_SITE_OTHER): Payer: Self-pay

## 2021-12-22 ENCOUNTER — Telehealth (INDEPENDENT_AMBULATORY_CARE_PROVIDER_SITE_OTHER): Payer: Self-pay

## 2021-12-22 NOTE — Telephone Encounter (Signed)
Called pt. LVMTCB to advise of recommendations. Dr. Dimple Casey sent message to Dr. Smith Robert, who advised it was okay to resume PCSK9 (Repatha or Praluent based on what insurance will cover). Can prescribe and start PA process.

## 2021-12-22 NOTE — Telephone Encounter (Signed)
Belinda Fisher, MD  Genevieve Norlander, RN; Benay Pillow, RN    Please start patient on Rapatha or Pralulent depending on insurance coverage     Thanks           Previous Messages     ----- Message -----     From: Tanya Nones, MD   Sent: 12/20/2021   2:17 PM EST   To: Belinda Fisher, MD   Subject: RE: PCSK9 inhibitor                            Hi Dr. Dimple Casey,     I would agree with resuming PCSK9 inhibitor. Thanks     ----- Message -----   From: Belinda Fisher, MD   Sent: 12/20/2021  10:09 AM EST   To: Tanya Nones, MD   Subject: PCSK9 inhibitor                                 Dr. Maeola Harman,     I'm writing regarding our shared patient Ms. Kalil. She has family history of CAD and severe hyperlipidemia with statin intolerance. I started her on PCSK9 inhibitor, but she stopped it due to hyperglycemia. Are you ok with [place] resuming Repatha or Pralulent?     Thanks   Belinda Fisher, MD, [facility], FSCAI   [facility], Interventional Cardiology

## 2021-12-22 NOTE — Telephone Encounter (Signed)
Opened in error, see other telephone encounter

## 2021-12-31 NOTE — Telephone Encounter (Addendum)
Called Emily Rowe back. Emily Rowe states she is still worried about her blood sugar with PCSK9s.  Inquired if she would be agreeable to re-starting one of them, she states she would be but not right now - she wants to wait until her blood sugar is under control.     After speaking with Emily Rowe, Emily Rowe states she started a previous prescription of Simvastatin about 2 weeks ago. Emily Rowe states she has 20 mg tablets, that she has been taking twice a day to reduce stomach problems.  She has been taking 40 mg total daily. Inquired who prescribed this medication, Emily Rowe states she can't remember.  Did verify it is not expired. She said that taking the Simvastatin this way has not caused any problems and being off of PCSK9's has improved her headaches she was experiencing.     Advised I would send message to Dr Dimple Casey.

## 2022-01-07 NOTE — Telephone Encounter (Signed)
Called pt just to update. LVMTCB.

## 2022-01-12 ENCOUNTER — Encounter (INDEPENDENT_AMBULATORY_CARE_PROVIDER_SITE_OTHER): Payer: Self-pay

## 2022-01-12 MED ORDER — SIMVASTATIN 20 MG PO TABS
20.0000 mg | ORAL_TABLET | Freq: Two times a day (BID) | ORAL | 3 refills | Status: DC
Start: 2022-01-12 — End: 2022-02-08

## 2022-01-12 NOTE — Telephone Encounter (Addendum)
Pt called [facility] stating that she received a letter from [facility] to come in for March appt.  I do not see an encounter about that .   Pt was just in to see Dr Dimple Casey in jan 2023.  I offered to get pt over to scheduling to see if they know why that letter may have been sent.    Pt declined to be transferred.  Pt asking if she can get more Simvastatin tabs as she is out and by phone, Dr Dimple Casey stated that she can continue on that medication for her cholesterol.      Rx sent.

## 2022-01-12 NOTE — Addendum Note (Signed)
Addended by: Bea Laura on: 01/12/2022 11:07 AM     Modules accepted: Orders

## 2022-01-12 NOTE — Telephone Encounter (Signed)
This encounter was created in error - please disregard.    Items noted as "reviewed" are for administrative purposes only and are not guaranteed by the provider to be accurate on this date.

## 2022-01-22 ENCOUNTER — Encounter (INDEPENDENT_AMBULATORY_CARE_PROVIDER_SITE_OTHER): Payer: Self-pay

## 2022-01-25 ENCOUNTER — Encounter (INDEPENDENT_AMBULATORY_CARE_PROVIDER_SITE_OTHER): Payer: Self-pay | Admitting: Family Medicine

## 2022-01-31 ENCOUNTER — Ambulatory Visit (INDEPENDENT_AMBULATORY_CARE_PROVIDER_SITE_OTHER): Payer: Medicare Other | Admitting: Family Medicine

## 2022-02-08 ENCOUNTER — Ambulatory Visit (INDEPENDENT_AMBULATORY_CARE_PROVIDER_SITE_OTHER): Payer: Medicare Other | Admitting: "Endocrinology

## 2022-02-08 ENCOUNTER — Encounter (INDEPENDENT_AMBULATORY_CARE_PROVIDER_SITE_OTHER): Payer: Self-pay | Admitting: "Endocrinology

## 2022-02-08 ENCOUNTER — Telehealth (INDEPENDENT_AMBULATORY_CARE_PROVIDER_SITE_OTHER): Payer: Self-pay | Admitting: Registered"

## 2022-02-08 VITALS — BP 150/88 | HR 79 | Temp 97.6°F | Resp 14 | Ht 65.0 in | Wt 212.0 lb

## 2022-02-08 DIAGNOSIS — E1165 Type 2 diabetes mellitus with hyperglycemia: Secondary | ICD-10-CM

## 2022-02-08 DIAGNOSIS — E669 Obesity, unspecified: Secondary | ICD-10-CM

## 2022-02-08 DIAGNOSIS — Z794 Long term (current) use of insulin: Secondary | ICD-10-CM

## 2022-02-08 DIAGNOSIS — E782 Mixed hyperlipidemia: Secondary | ICD-10-CM

## 2022-02-08 DIAGNOSIS — I10 Essential (primary) hypertension: Secondary | ICD-10-CM

## 2022-02-08 LAB — HEMOGLOBIN A1C
Average Estimated Glucose: 188.6 mg/dL
Hemoglobin A1C: 8.2 % — ABNORMAL HIGH (ref 4.6–5.6)

## 2022-02-08 LAB — MICROALBUMIN, RANDOM URINE
Urine Creatinine, Random: 13.5 mg/dL
Urine Microalbumin, Random: <5 ug/ml (ref 0.0–30.0)

## 2022-02-08 MED ORDER — ROSUVASTATIN CALCIUM 40 MG PO TABS
40.0000 mg | ORAL_TABLET | Freq: Every day | ORAL | 1 refills | Status: DC
Start: 2022-02-08 — End: 2022-12-16

## 2022-02-08 MED ORDER — EMPAGLIFLOZIN 10 MG PO TABS
10.0000 mg | ORAL_TABLET | Freq: Every morning | ORAL | 1 refills | Status: DC
Start: 2022-02-08 — End: 2022-02-25

## 2022-02-08 NOTE — Telephone Encounter (Signed)
LVM and contact information regarding scheduling RD visit.

## 2022-02-08 NOTE — Patient Instructions (Signed)
Start Jardiance 10 mg. 1 tablet daily in the morning to improve your diabetes control.     Switch to Rosuvastatin 40 mg. 1 tablet daily at night.     Stop Simvastatin when starting Rosuvastatin.     We will schedule you with a dietician for diet counseling

## 2022-02-08 NOTE — Progress Notes (Signed)
Subjective:      Date: 02/08/2022 11:16 AM   Patient ID: Emily Rowe is a 71 y.o. female.    Chief Complaint:  Chief Complaint   Patient presents with    Type 2 diabetes mellitus without complication, with long-te         HPI  Visit type: follow-up - type II DM   Diagnosed: over 5 years ago  Type: Insulin requiring  Complications: hyperglycemia  Control: inadequate control  Comorbid illness: hyperlipidemia  Last follow-up:  11/10/21 with Dr. Maeola Harman      Diabetes Medication Regimen: Glipizide XL 20 mg daily, Tradjenta 5 mg daily , Lantus 10 units daily (not taking lantus at present), taking Lantus only if HS glucose is 170 or above     Prior medications: Unable to tolerate Metformin due to diarrhea, unable to tolerate Victoza due to n/v, invokana. Levemir (caused nausea and vomiting), Evaristo Bury (did not tolerate, not sure reason for intolerance)     Taking potassium pill daily along with eating sweet potato and avocado everyday.     Home glucose readings:  recall  Fasting: 120s-130s  After lunch: 150s   Before dinner: 140s-150s     Podiatric assessment: admits LE burning/tingling due to hx of stroke   Exercise:  walks occasionally   Diet: moderate compliance with recommended diet;   Response to therapy: with hemoglobin A1C above goal  A1c trend: improving     Lab Results   Component Value Date    HGBA1C 8.7 (H) 11/02/2021    HGBA1C 8.7 (H) 07/12/2021    HGBA1C 9.1 (H) 04/13/2021     Trend is above goal  Additional concerns: In addition, pt has hyperlipidemia and is compliant with lipid therapy.  Pt denies side effects of lipid therapy - specifically denies myalgia., In addition, pt has stable essential HTN with no evidence of CHF or proteinuria.  The patient is compliant with anti-hypertensive therapy and denies side effects to therapy.  Pt denies CP, SOB, dizziness, orthopnea, PND or edema.     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     Hypertension  - reports  ambulatory BP usually in the 120s-130s/80s  - POC BP significantly elevated  - no evidence of CHF, denies chest pain, heart palpitations or fluid retention      ROS:  A complete 12 point ROS was obtained. Pertinent positives and negatives as noted in HPI. All other systems are negative.      Vitals:  There were no vitals taken for this visit.   Wt Readings from Last 3 Encounters:   02/08/22 96.2 kg (212 lb)   11/10/21 97.1 kg (214 lb)   11/04/21 93.9 kg (207 lb)       Physical Examination     General appearance: alert, appears stated age, cooperative and no distress  Eyes: conjunctivae/corneas clear. PERRL, EOM's intact.   Neck: no cervical lymphadenopathy, no carotid bruit,  supple, symmetrical, trachea midline   Thyroid:normal sized on inspection; non tender to palpation and no discrete nodules palpated  Lungs: clear to auscultation bilaterally  Heart: regular rate and rhythm, S1, S2 normal, no murmur, click, rub or gallop  Abdomen: soft, non-tender; bowel sounds normal; no masses,  no organomegaly  Extremities: extremities normal, atraumatic, no cyanosis or edema  Skin: Skin color, texture, turgor normal. No rashes or lesions  Neurologic: Alert and oriented X 3, normal strength and tone. Normal symmetric reflexes. No tremor  Assessment and Plan     Diagnoses and plan were reviewed and updated    1. Uncontrolled type 2 diabetes mellitus with hyperglycemia  -   Lab Results   Component Value Date    HGBA1C 8.7 (H) 11/02/2021    HGBA1C 8.7 (H) 07/12/2021    HGBA1C 9.1 (H) 04/13/2021       Current A1C is elevated above goal.  -  Recommend adding the following DM medication class to the current regimen - TZD.  Side effects of TZD (edema, headache, URI, weight gain, hypoglycemia) discussed with patient.  -  Recommend home glucometer testing twice a day (pre-breakfast or 2 hrs post-prandial).  -  recommend carb consistent diet and stay physically active as tolerated  - referred to CDE for diet counseling     -  empagliflozin (JARDIANCE) 10 MG tablet; Take 1 tablet (10 mg) by mouth every morning  Dispense: 90 tablet; Refill: 1  - Urine Microalbumin Random  - Hemoglobin A1C  - Comprehensive metabolic panel  - TSH  - Ambulatory referral to Diabetic Education; Future  - Urine Microalbumin Random; Future  - Hemoglobin A1C; Future  - Comprehensive metabolic panel; Future  - Lipid panel; Future    2. Morbid (severe) obesity due to excess calories  - Recommend calorie restricted diet and exercise 150 min/week combined cardio and strength training   - patient has hx of intolerance to glp-1 ra   - referred to CDE     3. Mixed hyperlipidemia  - LdL is elevated  - hx of taking Repatha, reports he stopped Repatha because it has increased sugars  - re-trial Rosuvastatin   - on Simvastatin and is well tolerated    - rosuvastatin (CRESTOR) 40 MG tablet; Take 1 tablet (40 mg) by mouth daily  Dispense: 90 tablet; Refill: 1  - Comprehensive metabolic panel  - Lipid panel  - Comprehensive metabolic panel; Future  - Lipid panel; Future    4. Essential hypertension  - per report ambulatory BP is well controlled   - on Losartan 100 mg daily     - Comprehensive metabolic panel  - Comprehensive metabolic panel; Future    5. Long-term insulin use    Scheduled with Dr. Maeola Harman in July     Bynum Bellows MSN FNP-BC  [facility]

## 2022-02-09 LAB — COMPREHENSIVE METABOLIC PANEL
ALT: 15 U/L (ref 0–55)
AST (SGOT): 19 U/L (ref 5–41)
Albumin/Globulin Ratio: 1.3 (ref 0.9–2.2)
Albumin: 4 g/dL (ref 3.5–5.0)
Alkaline Phosphatase: 100 U/L (ref 37–117)
Anion Gap: 5 (ref 5.0–15.0)
BUN: 12 mg/dL (ref 7.0–21.0)
Bilirubin, Total: 0.3 mg/dL (ref 0.2–1.2)
CO2: 32 mEq/L — ABNORMAL HIGH (ref 17–29)
Calcium: 9.5 mg/dL (ref 8.5–10.5)
Chloride: 102 mEq/L (ref 99–111)
Creatinine: 0.8 mg/dL (ref 0.4–1.0)
Globulin: 3.2 g/dL (ref 2.0–3.6)
Glucose: 141 mg/dL — ABNORMAL HIGH (ref 70–100)
Potassium: 4.3 mEq/L (ref 3.5–5.3)
Protein, Total: 7.2 g/dL (ref 6.0–8.3)
Sodium: 139 mEq/L (ref 135–145)

## 2022-02-09 LAB — LIPID PANEL
Cholesterol / HDL Ratio: 4.2 Index
Cholesterol: 206 mg/dL — ABNORMAL HIGH (ref 0–199)
HDL: 49 mg/dL (ref 40–9999)
LDL Calculated: 135 mg/dL — ABNORMAL HIGH (ref 0–99)
Triglycerides: 108 mg/dL (ref 34–149)
VLDL Calculated: 22 mg/dL (ref 10–40)

## 2022-02-09 LAB — GFR: EGFR: >60

## 2022-02-09 LAB — TSH: TSH: 1.7 u[IU]/mL (ref 0.35–4.94)

## 2022-02-09 LAB — HEMOLYSIS INDEX: Hemolysis Index: 11 Index (ref 0–24)

## 2022-02-09 NOTE — Progress Notes (Signed)
1. A1C is 8.2, is decreasing from 8.7, but still above target goal of 7 to 7.5, pls start Jardiance as discussed at recent visit, rx already sent.   2. LDL is still elevated, pls switch to Rosuvastatin as discussed, pls stop Simvastatin when starting Rosuvastatin.   3. Urine microalbumin is normal indicates NO evidence of kidney inflammation from the diabetes which is good.   Kidney function is normal  4. Electrolytes and liver enzymes are normal, thyroid function is normal.

## 2022-02-10 ENCOUNTER — Telehealth: Payer: Self-pay | Admitting: Internal Medicine

## 2022-02-10 ENCOUNTER — Telehealth (INDEPENDENT_AMBULATORY_CARE_PROVIDER_SITE_OTHER): Payer: Self-pay | Admitting: Registered"

## 2022-02-10 NOTE — Telephone Encounter (Signed)
Returned patient call.  LVM regarding setting education appointment up.

## 2022-02-10 NOTE — Telephone Encounter (Signed)
Pt is returning a nurse's call. She missed it this morning. Can someone please assist the pt. [phone]

## 2022-02-21 ENCOUNTER — Encounter (INDEPENDENT_AMBULATORY_CARE_PROVIDER_SITE_OTHER): Payer: Self-pay

## 2022-02-25 ENCOUNTER — Encounter (INDEPENDENT_AMBULATORY_CARE_PROVIDER_SITE_OTHER): Payer: Self-pay | Admitting: Family Medicine

## 2022-02-25 ENCOUNTER — Ambulatory Visit (INDEPENDENT_AMBULATORY_CARE_PROVIDER_SITE_OTHER): Payer: Medicare Other | Admitting: Family Medicine

## 2022-02-25 VITALS — BP 141/84 | HR 79 | Temp 97.6°F | Ht 65.0 in | Wt 209.0 lb

## 2022-02-25 DIAGNOSIS — Z01818 Encounter for other preprocedural examination: Secondary | ICD-10-CM

## 2022-02-25 DIAGNOSIS — R350 Frequency of micturition: Secondary | ICD-10-CM

## 2022-02-25 LAB — CBC AND DIFFERENTIAL
Absolute NRBC: 0 10*3/uL (ref 0.00–0.00)
Basophils Absolute Automated: 0.04 10*3/uL (ref 0.00–0.08)
Basophils Automated: 0.6 %
Eosinophils Absolute Automated: 0.03 10*3/uL (ref 0.00–0.44)
Eosinophils Automated: 0.4 %
Hematocrit: 40.7 % (ref 34.7–43.7)
Hgb: 12.8 g/dL (ref 11.4–14.8)
Immature Granulocytes Absolute: 0.02 10*3/uL (ref 0.00–0.07)
Immature Granulocytes: 0.3 %
Instrument Absolute Neutrophil Count: 4.59 10*3/uL (ref 1.10–6.33)
Lymphocytes Absolute Automated: 1.65 10*3/uL (ref 0.42–3.22)
Lymphocytes Automated: 24.7 %
MCH: 28.3 pg (ref 25.1–33.5)
MCHC: 31.4 g/dL — ABNORMAL LOW (ref 31.5–35.8)
MCV: 90 fL (ref 78.0–96.0)
MPV: 13.9 fL — ABNORMAL HIGH (ref 8.9–12.5)
Monocytes Absolute Automated: 0.35 10*3/uL (ref 0.21–0.85)
Monocytes: 5.2 %
Neutrophils Absolute: 4.59 10*3/uL (ref 1.10–6.33)
Neutrophils: 68.8 %
Nucleated RBC: 0 /100 WBC (ref 0.0–0.0)
Platelets: 141 10*3/uL — ABNORMAL LOW (ref 142–346)
RBC: 4.52 10*6/uL (ref 3.90–5.10)
RDW: 13 % (ref 11–15)
WBC: 6.68 10*3/uL (ref 3.10–9.50)

## 2022-02-25 NOTE — Progress Notes (Signed)
Date: 02/25/2022 12:55 PM   Patient ID: Emily Rowe is a 71 y.o. female.         Have you seen any specialists since your last visit with [place]?  Yes    Health Maintenance Due   Topic Date Due    Advance Directive on File  Never done    Shingrix Vaccine 50+ (1) Never done    Medicare Annual Wellness Visit  09/04/2020    COVID-19 Vaccine (4 - Booster for Moderna series) 11/22/2020    DXA Scan  09/23/2021    MAMMOGRAM  11/04/2021    DM OPHTHALMOLOGY EXAM  12/02/2021           Subjective:     Chief Complaint:  Chief Complaint   Patient presents with    Pre-op Exam       HPI:  Visit Type: Pre-operative Evaluation  Procedure:  cystoscopy Bulkamid Injection  Date of Surgery: 03/07/22  Surgeon: Dr. Kyung Rudd  Fax Number (Required): [phone]  Reason for Surgery: urinary incontinence  Recent Health (admits): fatigue, urinary frequency, LE swelling, and poor exercise tolerance  Recent Health (denies): fever, chest pain, cough, nausea, vomiting, diarrhea, dyspnea, dysuria, abdominal pain, and easy bruising  ---------------------------------------  Exercise Tolerance: 6 met ( i.e. shoveling snow )  Surgical Risk Factors: hypertension, history of CVA, and diabetes above goal  Prior Anesthesia: Patient reports no adverse reaction to anesthesia in the past.         Problem List:  Patient Active Problem List   Diagnosis    Uncontrolled type 2 diabetes mellitus with hyperglycemia    Mixed hyperlipidemia    Obesity (BMI 30-39.9)    Neuropathy    Stroke    Essential hypertension    Diabetic peripheral neuropathy    History of hemorrhagic stroke with residual hemiparesis    COVID-19 virus infection    Pancreas cyst    Breast pain, left    Perforated abdominal viscus    Anemia, unspecified type    Pure hypercholesterolemia, unspecified    Depression, unspecified depression type    Bilateral carotid artery disease, unspecified type    Fibromyalgia muscle pain    Morbid (severe) obesity due to excess calories       Current  Medications:  Outpatient Medications Marked as Taking for the 02/25/22 encounter (Office Visit) with Darnelle Bos, MD   Medication Sig Dispense Refill    aspirin 81 MG chewable tablet Chew 1 tablet (81 mg) by mouth twice a week      glipiZIDE (GLUCOTROL) 10 MG 24 hr tablet Take 1 tablet (10 mg) by mouth 2 (two) times daily 180 tablet 1    glucose blood test strip Check sugar three times a day, ICD 10 E11.9 100 each 10    insulin glargine (LANTUS SOLOSTAR) 100 UNIT/ML injection pen Inject 10 units into skin daily at bedtime ONLY AS NEEDED IF BLOOD SUGAR >OR = TO 170  LAST TAKEN 07/2020 15 mL 1    linaGLIPtin (Tradjenta) 5 MG Tab Take 1 tablet (5 mg total) by mouth daily 90 tablet 3    LORazepam (ATIVAN) 0.5 MG tablet Take 0.5 tablets (0.25 mg total) by mouth nightly as needed for Anxiety 20 tablet 0    losartan (COZAAR) 50 MG tablet TAKE 1 TABLET(50 MG) BY MOUTH TWICE DAILY 60 tablet 3    verapamil ER (CALAN-SR) 120 MG CR tablet Take 1 tablet (120 mg) by mouth daily 90 tablet 3  Allergies:  Allergies   Allergen Reactions    Codeine Nausea And Vomiting    Crestor [Rosuvastatin] Rash     D/c due to severe rash and higher blood sugars    Glucosamine Hives and Nausea And Vomiting    Latex Rash    Tree Nuts Hives     ESPECIALLY ALMONDS     Dairy [Milk-Related Compounds]     Eggs Or Egg-Derived Products      Patient had an allergy test and stated that she was allergic to eggs    Gabapentin      Dizziness and rash, irregular heart rate    Gluten Meal     Invokana [Canagliflozin]     Levemir [Insulin Detemir] Nausea And Vomiting    Lyrica [Pregabalin]      PT STATES IT MAKES HER NEUROPATHY WORSE STATES SHE FEELS LIKE SHE IS ON FIRE    Metformin      diarrhea    Sucralfate Nausea And Vomiting     BODY ACHES, FEVER    Evaristo Bury [Insulin Degludec]     Victoza [Liraglutide] Nausea And Vomiting    Flaxseed [Flaxseed (Linseed)] Rash    Omega 3 [Fish Oil] Rash    Peanut-Containing Drug Products Rash       Past Medical  History:  Past Medical History:   Diagnosis Date    Anxiety     Asthma     allergic reaction to mold, not since 20 years, HAS WHEEZING IN COLD WEATHER. NO INHALERS    Bowel perforation     COLONOSCOPY 09/2019    Depression     Diabetes     Difficulty walking     WEAK RIGHT LEG AFTER CVA 2021    Dysphagia     BIG PILLS ONLY     Gastroesophageal reflux disease     PREVACID OTC PRN     Hyperlipemia     Hypertensive disorder     Hypokalemia     CMP REQ PCP 09/14/20    Neuropathy     NUMBNESS RIGHT UE AND LE     Post-operative nausea and vomiting     ALWAYS WITH ANESTHESIA     Sleep apnea 2017    NO CPAP NEEDED PER PT     Stroke 2012     ARM, LEG WEAK. WALKS WITH CANE, WALKER    TIA (transient ischemic attack)     MULTIPLE BEFORE CVA 2012    Type 2 diabetes mellitus, controlled 2021    MANAGED BY DR Binnie Rail RAO. AM FSB 117-140. TAKES LANTUS PRN IN AM. HAIC 7.9       Past Surgical History:  Past Surgical History:   Procedure Laterality Date    BREAST BIOPSY Left     benign lump biopsy    COLONOSCOPY      X 2    COLONOSCOPY N/A 09/17/2020    Procedure: COLONOSCOPY;  Surgeon: Edrick Oh, MD;  Location: [facility];  Service: Gastroenterology;  Laterality: N/A;    EGD N/A 10/28/2020    Procedure: EGD w/ bx's;  Surgeon: Philbert Riser, MD;  Location: [facility];  Service: Gastroenterology;  Laterality: N/A;    HYSTERECTOMY      2005    OOPHORECTOMY Bilateral     2005    REDUCTION MAMMAPLASTY      TONSILLECTOMY      urinary bladder      BLADDER LIFT        Family  History:  Family History   Problem Relation Age of Onset    Diabetes Mother     Heart attack Mother     Diabetes Maternal Grandmother     Stroke Maternal Grandmother     Breast cancer Daughter 30    Stroke Maternal Grandfather        Social History:  Social History     Tobacco Use    Smoking status: Never    Smokeless tobacco: Never   Vaping Use    Vaping status: Never Used   Substance Use Topics    Alcohol use: Never    Drug use: Never         The following sections were reviewed this encounter by the provider:   Tobacco  Allergies  Meds  Problems  Med Hx  Surg Hx  Fam Hx         Vitals:  BP 141/84 (BP Site: Left arm, Patient Position: Sitting, Cuff Size: Large)   Pulse 79   Temp 97.6 F (36.4 C) (Oral)   Ht 1.651 m (5\' 5" )   Wt 94.8 kg (209 lb)   SpO2 96%   BMI 34.78 kg/m       ROS:  Constitutional: Denies any fever, chills, weight loss  Eyes: Denis blurry vision, double vision  HENT: Denies chronic headache, coryza, sore throat  Cardiovascular: Denies chest pain, shortness of breath, lightheadedness, palpitations  Respiratory: Denies cough, shortness of breath  Gastrointestinal: Denies abd pain, diarrhea, constipation, nausea, vomiting, blood in the stool  GU: Denies dysuria, nocturia, hematuria  Muskuloskeletal: Denies chronic myalgias or joint pain  All other systems reviewed and is negative    Physical Exam:  Constitutional: well nourished, well developed female in no acute distress  Eyes: pupils round and reactive to light bilaterally, extraocular movements intact bilaterally, conjunctiva clear bilaterally  ENT: sinuses nontender, ear canals clean and tympanic membranes intact bilaterally, hearing grossly intact, nasal membranes nonerythematous bilaterally, oropharynx pink and moist, throat nonerythematous and clear of exudate  Neck: supple with no limited range of motion, thyroid normal sized  Card: S1S2, RRR, no murmurs noted, no observed pitting edema in bilateral lower extremities  Pulm: clear to auscultation bilaterally, no wheezes or rhonchi noted, good respiratory effort with no use of accessory muscles  GI: nontender, nondistended, no masses noted.  Lymph: no enlarged submandibular or cervical nodes noted  Neuro: Awake and alert, oriented x 3  Psych: normal affect with normal insight, nonanxious    EKG showed normal sinus rhythm at rate of 77/min, normal axis, normal QTc, normal PR interval.  No ST elevation. When compared to  EKG dated 09/16/2020, no acute ST-T changes observed.  CXR - Not requested by patient's surgeon.  Labs - CBC/CMP/Urine culture requesd - Per patient's preop orders, patient's labs are okay if within 3 months and urine culture within 14 days of surgery.    Patient has had CMP dated 02/08/2022.  CBC and urine culture have been ordered today.      Lab Results   Component Value Date    WBC 7.97 07/14/2021    HGB 13.0 07/14/2021    HCT 41.8 07/14/2021    PLT 140 (L) 07/14/2021    CHOL 206 (H) 02/08/2022    TRIG 108 02/08/2022    HDL 49 02/08/2022    LDL 135 (H) 02/08/2022    ALT 15 02/08/2022    AST 19 02/08/2022    NA 139 02/08/2022    K 4.3 02/08/2022  CL 102 02/08/2022    CREAT 0.8 02/08/2022    BUN 12.0 02/08/2022    CO2 32 (H) 02/08/2022    TSH 1.70 02/08/2022    INR 1.0 07/14/2021    GLU 141 (H) 02/08/2022    HGBA1C 8.2 (H) 02/08/2022          Assessment and Plan:    Hold ASA, NSAIDs, herbal and alternative medications at least 10 days prior to surgery     I advised the patient to hold glipizide and Tradjenta in the morning of her surgical procedure due to n.p.o. status and to resume those medications when diet started.      Ms. Asher physical exam is unremarkable.  Her vital signs are all within normal range.  Her exercise tolerance is at least 6 METs.  The 12 lead EKG obtained today.  Please see the result above.   She is at low to intermediate risk for a medical complication during this low risk surgery and may proceed with the planned surgical procedure.    The patient has been advised to take her usual doses of the prescription medications on the day before the surgery.    I advised the patient to stop taking aspirin, fish oil supplement, herbal supplements, ibuprofen, and naproxen for at least 10 days prior to the surgery.  Acetaminophen (Tylenol) 325 mg/tablet, 2 tablets PO every 4 - 6 hours can be taken as needed for pain and fever.     Pre-Operative Evaluation:  Surgery Specific Risk:  Low (endoscopic,  superficial, breast, opthalmologic, minor orthopedic)  - Pt is at low risk for peri-operative cardiovascular complications during planned low risk surgical procedure.      1. Preop cardiac clearance: By 2007 ACC/AHA guidelines, pt has history of type 2 diabetes, mixed hyperlipidemia, essential hypertension and previous stroke.  she is low to intermediate risk for a low-intermediate risk procedure. Can proceed with planned surgery.  -METs: Pt is able to perform > or equal to 4 mets.   -medication management: recommend stopping all nsaids including asa 10 days prior to procedure or as directed by surgeon. Can take meds the morning of the procedure with sips of water.   -Preop labs: ordered as above  -EKG: As above.  -CXR: not requested/indicated      Follow up after procedure as needed. Call with questions and updates.         Darnelle Bos, MD

## 2022-03-01 ENCOUNTER — Other Ambulatory Visit (INDEPENDENT_AMBULATORY_CARE_PROVIDER_SITE_OTHER): Payer: Self-pay | Admitting: Family Medicine

## 2022-03-01 DIAGNOSIS — I10 Essential (primary) hypertension: Secondary | ICD-10-CM

## 2022-03-24 ENCOUNTER — Encounter (INDEPENDENT_AMBULATORY_CARE_PROVIDER_SITE_OTHER): Payer: Self-pay

## 2022-03-31 ENCOUNTER — Encounter (INDEPENDENT_AMBULATORY_CARE_PROVIDER_SITE_OTHER): Payer: Self-pay | Admitting: Family Medicine

## 2022-04-13 ENCOUNTER — Other Ambulatory Visit (INDEPENDENT_AMBULATORY_CARE_PROVIDER_SITE_OTHER): Payer: Self-pay | Admitting: Gastroenterology

## 2022-04-14 ENCOUNTER — Other Ambulatory Visit (INDEPENDENT_AMBULATORY_CARE_PROVIDER_SITE_OTHER): Payer: Self-pay | Admitting: Internal Medicine

## 2022-04-14 DIAGNOSIS — E1165 Type 2 diabetes mellitus with hyperglycemia: Secondary | ICD-10-CM

## 2022-04-23 ENCOUNTER — Encounter (INDEPENDENT_AMBULATORY_CARE_PROVIDER_SITE_OTHER): Payer: Self-pay

## 2022-04-28 ENCOUNTER — Telehealth (INDEPENDENT_AMBULATORY_CARE_PROVIDER_SITE_OTHER): Payer: Self-pay | Admitting: Family Medicine

## 2022-04-28 NOTE — Telephone Encounter (Signed)
I see the [place] results states MRI needed but the [place] was ordered by Dr Delane Ginger. Patient has an appointment with you in August.  Checking with you if you know something more about this and are going to order the MRI?  I can schedule patient for a sooner appointment to discuss if you want

## 2022-04-28 NOTE — Telephone Encounter (Signed)
Pt would like referral    MRI for pancreas with [facility](pt says we should have this information)    Does not have mychart so if PCP can make then contact pt when its ready pt will pick up at front desk.    Ph. [phone] or call back if need more clarification or need to update pt on the matter. Again pt does not use mychart so do not message there.

## 2022-05-06 ENCOUNTER — Encounter (HOSPITAL_BASED_OUTPATIENT_CLINIC_OR_DEPARTMENT_OTHER): Payer: Self-pay

## 2022-05-11 ENCOUNTER — Encounter (HOSPITAL_BASED_OUTPATIENT_CLINIC_OR_DEPARTMENT_OTHER): Payer: Self-pay | Admitting: Internal Medicine

## 2022-05-11 ENCOUNTER — Ambulatory Visit (INDEPENDENT_AMBULATORY_CARE_PROVIDER_SITE_OTHER): Payer: Medicare Other | Admitting: Internal Medicine

## 2022-05-11 VITALS — BP 164/90 | HR 76 | Temp 98.2°F | Resp 18 | Wt 211.2 lb

## 2022-05-11 DIAGNOSIS — I10 Essential (primary) hypertension: Secondary | ICD-10-CM

## 2022-05-11 DIAGNOSIS — E1165 Type 2 diabetes mellitus with hyperglycemia: Secondary | ICD-10-CM

## 2022-05-11 DIAGNOSIS — Z794 Long term (current) use of insulin: Secondary | ICD-10-CM

## 2022-05-11 DIAGNOSIS — E782 Mixed hyperlipidemia: Secondary | ICD-10-CM

## 2022-05-11 DIAGNOSIS — E669 Obesity, unspecified: Secondary | ICD-10-CM

## 2022-05-11 DIAGNOSIS — Z6835 Body mass index (BMI) 35.0-35.9, adult: Secondary | ICD-10-CM

## 2022-05-11 LAB — POCT GLUCOSE: Whole Blood Glucose POCT: 127 mg/dL — AB (ref 70–100)

## 2022-05-11 LAB — POCT GLYCOSYLATED HEMOGLOBIN (HGB A1C): POCT Hgb A1C: 8.9 % — AB (ref 3.9–5.9)

## 2022-05-11 MED ORDER — EMPAGLIFLOZIN 10 MG PO TABS
10.0000 mg | ORAL_TABLET | Freq: Every day | ORAL | 3 refills | Status: DC
Start: 2022-05-11 — End: 2023-02-13

## 2022-05-11 NOTE — Progress Notes (Signed)
Subjective:      Date: 05/11/2022 9:54 AM   Patient ID: Emily Rowe is a 71 y.o. female.    Chief Complaint:  Chief Complaint   Patient presents with    Diabetes     BS: 127       A1c:  8.9%         HPI  Visit type: follow-up - type II DM   Diagnosed: over 5 years ago  Type: Insulin requiring  Complications: hyperglycemia  Control: inadequate control  Comorbid illness: hyperlipidemia  Last follow-up:  11/10/21 with Dr. Maeola Harman      Diabetes Medication Regimen: Glipizide XL 20 mg daily, Tradjenta 5 mg daily , Lantus 10 units daily at night (not taking lantus at present), taking Lantus only if HS glucose is 170 or above. Did not try jardiance.     Prior medications: Unable to tolerate Metformin due to diarrhea, unable to tolerate Victoza due to n/v, invokana. Levemir (caused nausea and vomiting), Evaristo Bury (did not tolerate, not sure reason for intolerance)     Taking potassium pill daily along with eating sweet potato and avocado everyday.     Home glucose readings:  recall. Not consistent checking BG.  Fasting: 120s  After lunch: 150s        Podiatric assessment: admits LE burning/tingling due to hx of stroke   Exercise:  walks occasionally   Diet: moderate compliance with recommended diet;   Response to therapy: with hemoglobin A1C above goal  A1c trend: improving     Lab Results   Component Value Date    HGBA1C 8.2 (H) 02/08/2022    HGBA1C 8.7 (H) 11/02/2021    HGBA1C 8.7 (H) 07/12/2021     Trend is above goal  Additional concerns: In addition, pt has hyperlipidemia and is compliant with lipid therapy.  Pt denies side effects of lipid therapy - specifically denies myalgia., In addition, pt has stable essential HTN with no evidence of CHF or proteinuria.  The patient is compliant with anti-hypertensive therapy and denies side effects to therapy.  Pt denies CP, SOB, dizziness, orthopnea, PND or edema.     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     Hypertension  -  reports ambulatory BP usually in the 120s-130s/80s  - POC BP significantly elevated  - no evidence of CHF, denies chest pain, heart palpitations or fluid retention    Did not start jardiance as recommended due to pelvic surgery. Plans to start now.    ROS:  A complete 12 point ROS was obtained. Pertinent positives and negatives as noted in HPI. All other systems are negative.      Vitals:  BP 164/90 (BP Site: Left arm, Patient Position: Sitting, Cuff Size: Large)   Pulse 76   Temp 98.2 F (36.8 C) (Temporal)   Resp 18   Wt 95.8 kg (211 lb 3.2 oz)   SpO2 95%   BMI 35.15 kg/m    Wt Readings from Last 3 Encounters:   05/11/22 95.8 kg (211 lb 3.2 oz)   02/25/22 94.8 kg (209 lb)   02/08/22 96.2 kg (212 lb)       Physical Examination     General appearance: alert, appears stated age, cooperative and no distress  Eyes: conjunctivae/corneas clear. PERRL, EOM's intact.   Neck: no cervical lymphadenopathy, no carotid bruit,  supple, symmetrical, trachea midline   Thyroid:normal sized on inspection; non tender to palpation and no  discrete nodules palpated  Lungs: clear to auscultation bilaterally  Heart: regular rate and rhythm, S1, S2 normal, no murmur, click, rub or gallop  Abdomen: soft, non-tender; bowel sounds normal; no masses,  no organomegaly  Extremities: extremities normal, atraumatic, no cyanosis or edema  Skin: Skin color, texture, turgor normal. No rashes or lesions  Neurologic: Alert and oriented X 3, normal strength and tone. Normal symmetric reflexes. No tremor      Assessment and Plan     Diagnoses and plan were reviewed and updated    1. Uncontrolled type 2 diabetes mellitus with hyperglycemia  -   Lab Results   Component Value Date    HGBA1C 8.2 (H) 02/08/2022    HGBA1C 8.7 (H) 11/02/2021    HGBA1C 8.7 (H) 07/12/2021       Current A1C is elevated above goal.  -  Recommend increasing the dose of SGLT-2 Inhibitor therapy.  Side effects of SGLT2 inhibitor (UTI, mycotic infection, polyuria, change in  renal function, hypotension) discussed with patient.  -  Recommend home glucometer testing twice a day (pre-breakfast or 2 hrs post-prandial).  Risk & Benefits of the new medication(s) were explained to the patient (and family) who verbalized understanding & agreed to the treatment plan. Patient (family) encouraged to contact me/clinical staff with any questions/concerns  Recommend trial of jardiance 10 mg daily. If unable to tolerate, will increase basal insulin to 14 units daily at bedtime.  -  recommend carb consistent diet and stay physically active as tolerated  - referred to CDE for diet counseling, new referral entered, prefers in person appointment.      - empagliflozin (JARDIANCE) 10 MG tablet; Take 1 tablet (10 mg) by mouth every morning  Dispense: 90 tablet; Refill: 1  - Urine Microalbumin Random  - Hemoglobin A1C  - Comprehensive metabolic panel  - TSH  - Ambulatory referral to Diabetic Education; Future  - Urine Microalbumin Random; Future  - Hemoglobin A1C; Future  - Comprehensive metabolic panel; Future  - Lipid panel; Future    2. Morbid (severe) obesity due to excess calories  - Recommend calorie restricted diet and exercise 150 min/week combined cardio and strength training   - patient has hx of intolerance to glp-1 ra   - referred to CDE     3. Mixed hyperlipidemia  Lab Results   Component Value Date    CHOL 206 (H) 02/08/2022    CHOL 216 (H) 11/02/2021    CHOL 150 07/12/2021     Lab Results   Component Value Date    HDL 49 02/08/2022    HDL 38 (L) 11/02/2021    HDL 42 07/12/2021     Lab Results   Component Value Date    LDL 135 (H) 02/08/2022    LDL 144 (H) 11/02/2021    LDL 79 07/12/2021     Lab Results   Component Value Date    TRIG 108 02/08/2022    TRIG 168 (H) 11/02/2021    TRIG 145 07/12/2021       - LdL is elevated  - hx of taking Repatha, reports he stopped Repatha because it has increased sugars  On Rosuvastatin 40 mg QHS at present.    - rosuvastatin (CRESTOR) 40 MG tablet; Take 1  tablet (40 mg) by mouth daily  Dispense: 90 tablet; Refill: 1  - Comprehensive metabolic panel  - Lipid panel  - Comprehensive metabolic panel; Future  - Lipid panel; Future    4. Essential hypertension  -  per report ambulatory BP is well controlled   - on Losartan 100 mg daily     - Comprehensive metabolic panel  - Comprehensive metabolic panel; Future    5. Long-term insulin use    Scheduled with Evangeline in 11/23 and with me in 1/24    Tanya Nones MD MPH  Endocrinologist, [facility]

## 2022-05-12 NOTE — Addendum Note (Signed)
Addended by: Pecola Lawless on: 05/12/2022 07:06 AM     Modules accepted: Orders

## 2022-05-24 ENCOUNTER — Encounter (INDEPENDENT_AMBULATORY_CARE_PROVIDER_SITE_OTHER): Payer: Self-pay

## 2022-05-27 ENCOUNTER — Telehealth (INDEPENDENT_AMBULATORY_CARE_PROVIDER_SITE_OTHER): Payer: Self-pay

## 2022-05-27 NOTE — Telephone Encounter (Signed)
Per previous clinical entries form March 2023, pt declined to restart/resume Repatha d/t headaches. No further action taken regarding the Repatha prior authorization.

## 2022-05-29 NOTE — Telephone Encounter (Signed)
Pt will speak with ordering MD Dr Delane Ginger. Will see her as scheduled 06/02/22

## 2022-06-02 ENCOUNTER — Ambulatory Visit (INDEPENDENT_AMBULATORY_CARE_PROVIDER_SITE_OTHER): Payer: Medicare Other | Admitting: Family Medicine

## 2022-06-03 NOTE — Telephone Encounter (Signed)
Received another PA request for Repatha. Please refer to my previous clinical note.

## 2022-06-07 ENCOUNTER — Encounter (INDEPENDENT_AMBULATORY_CARE_PROVIDER_SITE_OTHER): Payer: Self-pay | Admitting: Family Medicine

## 2022-06-21 ENCOUNTER — Telehealth: Payer: Self-pay | Admitting: "Endocrinology

## 2022-06-21 NOTE — Telephone Encounter (Signed)
Pt called would like to s/w nurse in regards to referral , she refused to take the 3 hour classes, I informed her the referral that was place but she states shes only wants to see a nutritionist

## 2022-06-24 ENCOUNTER — Encounter (INDEPENDENT_AMBULATORY_CARE_PROVIDER_SITE_OTHER): Payer: Self-pay

## 2022-06-28 ENCOUNTER — Other Ambulatory Visit (INDEPENDENT_AMBULATORY_CARE_PROVIDER_SITE_OTHER): Payer: Self-pay | Admitting: Family Medicine

## 2022-06-28 DIAGNOSIS — I10 Essential (primary) hypertension: Secondary | ICD-10-CM

## 2022-07-05 ENCOUNTER — Encounter (INDEPENDENT_AMBULATORY_CARE_PROVIDER_SITE_OTHER): Payer: Self-pay | Admitting: Family Medicine

## 2022-07-07 ENCOUNTER — Encounter (HOSPITAL_BASED_OUTPATIENT_CLINIC_OR_DEPARTMENT_OTHER): Payer: Self-pay

## 2022-07-07 NOTE — Progress Notes (Signed)
Writer faxed back signed prescription request form for Glipizide ER 10 mg table; #180 tabs back to [facility]. Fx# [phone].     Rosalio Macadamia, LPN II  09:38 pm

## 2022-07-18 ENCOUNTER — Ambulatory Visit (INDEPENDENT_AMBULATORY_CARE_PROVIDER_SITE_OTHER): Payer: Medicare Other | Admitting: Family Medicine

## 2022-07-24 ENCOUNTER — Encounter (INDEPENDENT_AMBULATORY_CARE_PROVIDER_SITE_OTHER): Payer: Self-pay

## 2022-08-05 ENCOUNTER — Other Ambulatory Visit (INDEPENDENT_AMBULATORY_CARE_PROVIDER_SITE_OTHER): Payer: Self-pay | Admitting: Internal Medicine

## 2022-08-09 ENCOUNTER — Ambulatory Visit (INDEPENDENT_AMBULATORY_CARE_PROVIDER_SITE_OTHER): Payer: Medicare Other | Admitting: Family Medicine

## 2022-08-09 ENCOUNTER — Encounter (INDEPENDENT_AMBULATORY_CARE_PROVIDER_SITE_OTHER): Payer: Self-pay | Admitting: Family Medicine

## 2022-08-09 VITALS — BP 165/92 | HR 68 | Temp 97.7°F | Resp 16 | Wt 210.0 lb

## 2022-08-09 DIAGNOSIS — I10 Essential (primary) hypertension: Secondary | ICD-10-CM

## 2022-08-09 DIAGNOSIS — E1142 Type 2 diabetes mellitus with diabetic polyneuropathy: Secondary | ICD-10-CM

## 2022-08-09 DIAGNOSIS — F419 Anxiety disorder, unspecified: Secondary | ICD-10-CM

## 2022-08-09 DIAGNOSIS — Z23 Encounter for immunization: Secondary | ICD-10-CM

## 2022-08-09 DIAGNOSIS — I69359 Hemiplegia and hemiparesis following cerebral infarction affecting unspecified side: Secondary | ICD-10-CM

## 2022-08-09 DIAGNOSIS — E1165 Type 2 diabetes mellitus with hyperglycemia: Secondary | ICD-10-CM

## 2022-08-09 DIAGNOSIS — E782 Mixed hyperlipidemia: Secondary | ICD-10-CM

## 2022-08-09 DIAGNOSIS — M797 Fibromyalgia: Secondary | ICD-10-CM

## 2022-08-09 LAB — URINE MICROALBUMIN, RANDOM
Urine Creatinine, Random: 77.4 mg/dL
Urine Microalbumin, Random: 12 ug/ml (ref 0.0–30.0)
Urine Microalbumin/Creatinine Ratio: 16 ug/mg (ref 0–30)

## 2022-08-09 LAB — HEMOGLOBIN A1C
Average Estimated Glucose: 211.6 mg/dL
Hemoglobin A1C: 9 % — ABNORMAL HIGH (ref 4.6–5.6)

## 2022-08-09 MED ORDER — LORAZEPAM 0.5 MG PO TABS
0.2500 mg | ORAL_TABLET | Freq: Every evening | ORAL | 0 refills | Status: AC | PRN
Start: 2022-08-09 — End: ?

## 2022-08-09 NOTE — Progress Notes (Signed)
Have you seen any specialists/other providers since your last visit with [place]?    Yes-Dr Erick Colace    Arm preference verified?   Yes    Health Maintenance Due   Topic Date Due    Advance Directive on File  Never done    Shingrix Vaccine 50+ (1) Never done    Medicare Annual Wellness Visit  09/04/2020    COVID-19 Vaccine (4 - Moderna series) 11/22/2020    DXA Scan  09/23/2021    MAMMOGRAM  11/04/2021    DM OPHTHALMOLOGY EXAM  12/02/2021         Date: 08/09/2022 1:05 PM   Patient ID: Emily Rowe is a 71 y.o. female.    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 in for follow-up    DM- seeing Dr Smith Robert   Seen July 2023- A1c 8.9- added jardiance 10 mg- urinating lot, inclding glipizide, insulin 10 lantus- uses only if needed, tradenta    Never started PT as tender at all times to touch    Mood fine, not anxious as before  Seeing therapist- appt today  Stopped zoloft- " I do not feel I need anymore"    Hypertension-chronic  She feels her blood pressure has been stable at home  Denies having any headache, chest pain shortness of breath    Hyperlipidemia-seeing cardiology and getting Repatha    HPI       The other 14 point review systems are negative except those in HPI.    Current Medications:  Outpatient Medications Marked as Taking for the 08/09/22 encounter (Office Visit) with Rebecca Eaton, MD   Medication Sig Dispense Refill    aspirin 81 MG chewable tablet Chew 1 tablet (81 mg) by mouth twice a week      empagliflozin (Jardiance) 10 MG tablet Take 1 tablet (10 mg) by mouth daily 90 tablet 3    glipiZIDE (GLUCOTROL) 10 MG 24 hr tablet Take 1 tablet (10 mg) by mouth 2 (two) times daily 180 tablet 1    glucose blood test strip Check sugar three times a day,  ICD 10 E11.9 100 each 10    hydrocortisone 2.5 % cream APPLY TO AFFECTED AREAS OF THE FACE TWICE DAILY FOR UP TO ONE WEEK. TAKE ONE WEEK BREAK BEFORE REPEATING CYCLE      insulin glargine (LANTUS SOLOSTAR) 100 UNIT/ML injection pen Inject 10 units into skin daily at bedtime ONLY AS NEEDED IF BLOOD SUGAR >OR = TO 170  LAST TAKEN 07/2020 15 mL 1    losartan (COZAAR) 50 MG tablet TAKE 1 TABLET(50 MG) BY MOUTH TWICE DAILY 60 tablet 3    meloxicam (MOBIC) 15 MG tablet TAKE 1 TABLET BY MOUTH EVERY DAY FOR 15 DAYS      omeprazole (PriLOSEC) 20 MG capsule 1 capsule (20 mg)      rosuvastatin (CRESTOR) 40 MG tablet Take 1 tablet (40 mg) by mouth daily 90 tablet 1    Tradjenta 5 MG Tab TAKE 1 TABLET(5 MG) BY MOUTH DAILY 90 tablet 3    verapamil ER (CALAN-SR) 120 MG CR tablet Take 1 tablet (120 mg) by mouth daily 90 tablet 3    [  DISCONTINUED] LORazepam (ATIVAN) 0.5 MG tablet Take 0.5 tablets (0.25 mg total) by mouth nightly as needed for Anxiety 20 tablet 0       Past Medical History:  Past Medical History:   Diagnosis Date    Anxiety     Asthma     allergic reaction to mold, not since 20 years, HAS WHEEZING IN COLD WEATHER. NO INHALERS    Bowel perforation     COLONOSCOPY 09/2019    Depression     Diabetes     Difficulty walking     WEAK RIGHT LEG AFTER CVA 2021    Dysphagia     BIG PILLS ONLY     Gastroesophageal reflux disease     PREVACID OTC PRN     Hyperlipemia     Hypertensive disorder     Hypokalemia     CMP REQ PCP 09/14/20    Neuropathy     NUMBNESS RIGHT UE AND LE     Post-operative nausea and vomiting     ALWAYS WITH ANESTHESIA     Sleep apnea 2017    NO CPAP NEEDED PER PT     Stroke 2012     ARM, LEG WEAK. WALKS WITH CANE, WALKER    TIA (transient ischemic attack)     MULTIPLE BEFORE CVA 2012    Type 2 diabetes mellitus, controlled 2021    MANAGED BY DR Binnie Rail RAO. AM FSB 117-140. TAKES LANTUS PRN IN AM. HAIC 7.9       Past Surgical History:  Past Surgical History:   Procedure Laterality Date    BREAST BIOPSY Left      benign lump biopsy    COLONOSCOPY      X 2    COLONOSCOPY N/A 09/17/2020    Procedure: COLONOSCOPY;  Surgeon: Edrick Oh, MD;  Location: [facility];  Service: Gastroenterology;  Laterality: N/A;    EGD N/A 10/28/2020    Procedure: EGD w/ bx's;  Surgeon: Philbert Riser, MD;  Location: [facility];  Service: Gastroenterology;  Laterality: N/A;    HYSTERECTOMY      2005    OOPHORECTOMY Bilateral     2005    REDUCTION MAMMAPLASTY      TONSILLECTOMY      urinary bladder      BLADDER LIFT        Family History:  Family History   Problem Relation Age of Onset    Diabetes Mother     Heart attack Mother     Diabetes Maternal Grandmother     Stroke Maternal Grandmother     Breast cancer Daughter 30    Stroke Maternal Grandfather        Social History:   reports that she has never smoked. She has never used smokeless tobacco. She reports that she does not drink alcohol and does not use drugs.    Allergies:  Allergies   Allergen Reactions    Codeine Nausea And Vomiting    Crestor [Rosuvastatin] Rash     D/c due to severe rash and higher blood sugars    Glucosamine Hives and Nausea And Vomiting    Latex Rash    Tree Nuts Hives     ESPECIALLY ALMONDS     Dairy [Milk-Related Compounds]     Eggs Or Egg-Derived Products      Patient had an allergy test and stated that she was allergic to eggs    Gabapentin      Dizziness  and rash, irregular heart rate    Gluten Meal     Invokana [Canagliflozin]     Levemir [Insulin Detemir] Nausea And Vomiting    Lyrica [Pregabalin]      PT STATES IT MAKES HER NEUROPATHY WORSE STATES SHE FEELS LIKE SHE IS ON FIRE    Metformin      diarrhea    Sucralfate Nausea And Vomiting     BODY ACHES, FEVER    Evaristo Bury [Insulin Degludec]     Victoza [Liraglutide] Nausea And Vomiting    Flaxseed [Flaxseed (Linseed)] Rash    Omega 3 [Fish Oil] Rash    Peanut-Containing Drug Products Rash        The following sections were reviewed this encounter by the provider:   Tobacco  Allergies   Meds  Problems  Med Hx  Surg Hx  Fam Hx         VITALS:  Vitals:    08/09/22 0923   BP: (!) 165/92   BP Site: Left arm   Patient Position: Sitting   Cuff Size: Large   Pulse: 68   Resp: 16   Temp: 97.7 F (36.5 C)   TempSrc: Oral   SpO2: 97%   Weight: 95.3 kg (210 lb)      ROS:  General/Constitutional:   Denies Chills. Denies Fatigue. Denies Fever.   Ophthalmologic:   Denies Blurred vision. Denies Eye Pain.   ENT:   Denies Nasal Discharge. Denies Ear pain. Denies Sinus pain.   Endocrine:   Denies Polydipsia. Denies Polyuria.   Respiratory:   Denies Cough. Denies Orthopnea. Denies Shortness of breath. Denies Wheezing.   Cardiovascular:   Denies Chest pain. Denies Chest pain with exertion. Denies Leg Claudication. Denies      Palpitations. Denies Swelling in hands/feet.   Gastrointestinal:   Denies Abdominal pain. Denies Blood in stool. Denies Constipation. Denies Diarrhea.      Denies Heartburn. Denies Nausea. Denies Vomiting.   Genitourinary:   Denies Blood in urine. Denies Frequent urination. Denies Painful urination.   Musculoskeletal:   + Leg cramps. + Muscle aches.   Skin:   Denies Skin lesion(s).   Neurologic:   Denies Dizziness. +Gait abnormality. Denies Headache. +  Tingling/Numbness.      Physical Exam:  Alert and oriented. Affect is normal and appropriate. Clear thought process with normal reasoning and conversational tone and logic. No psychomotor retardation. Not in any acute distress  Skin: no rash or abnormal lesions  Eyes - normal eye movements.  Pupils equal and reactive to light  Eyelid normal on exam  Ears - TM clear, external ear normal  Nose - no acute changes  Throat - Oral mucosa moist and pink.  Neck- LEFT SIDE neck thyroid nodule, nontender, no palpable cervical lymph nodes  HEART: S1S2 heard, regular rate and rhythm  LUNGS: good air exchange, no crackles or ronchi or wheezing  ABDOMEN: Positive bowel sounds, no hepatospleenomegaly appreciated, no tenderness   Back: Normal curvature, no  tenderness.   Extremities: FROM, no deformities, no edema, no erythema.   No knee effusion.   mild tendernesson the joint line.  Neuro: Physiological, speech better than before.   Right side weakness.   Restricted and only able to abduct <20%, Right leg weakness.   Strength RUE and RLE 3/5.        Psych-good eye contact  Train of thought normal  Judgment normal      Lab Results   Component Value Date  WBC 6.68 02/25/2022    HGB 12.8 02/25/2022    HCT 40.7 02/25/2022    PLT 141 (L) 02/25/2022    CHOL 206 (H) 02/08/2022    TRIG 108 02/08/2022    HDL 49 02/08/2022    LDL 135 (H) 02/08/2022    ALT 15 02/08/2022    AST 19 02/08/2022    NA 139 02/08/2022    K 4.3 02/08/2022    CL 102 02/08/2022    CREAT 0.8 02/08/2022    BUN 12.0 02/08/2022    CO2 32 (H) 02/08/2022    TSH 1.70 02/08/2022    INR 1.0 07/14/2021    GLU 141 (H) 02/08/2022    HGBA1C 9.0 (H) 08/09/2022         ASSESSMENT AND PLAN:  1. Uncontrolled type 2 diabetes mellitus with hyperglycemia  - Comprehensive metabolic panel  - Hemoglobin A1C  - Lipid panel  - Urine Microalbumin Random  - TSH  - T4, free    2. Mixed hyperlipidemia  - Comprehensive metabolic panel  - Hemoglobin A1C  - Lipid panel  - Urine Microalbumin Random  - TSH  - T4, free    3. Essential hypertension  - Comprehensive metabolic panel  - Hemoglobin A1C  - Lipid panel  - Urine Microalbumin Random  - TSH  - T4, free    4. Diabetic peripheral neuropathy  - Comprehensive metabolic panel  - Hemoglobin A1C  - Lipid panel  - Urine Microalbumin Random  - TSH  - T4, free    5. History of hemorrhagic stroke with residual hemiparesis    6. Fibromyalgia muscle pain    7. Need for vaccination  - Flu vaccine HIGH-DOSE QUAD (PF) 65 yrs and older    8. Anxiety  - LORazepam (ATIVAN) 0.5 MG tablet; Take 0.5 tablets (0.25 mg) by mouth nightly as needed for Anxiety  Dispense: 20 tablet; Refill: 0             anxiety-on and off issue  Seeing counsellor  Feels no need for medication that she needs to take every  day  She is requesting a refill for her Ativan which she rarely uses last fill was 2022  Discussed with patient this cannot be her routine medication and should to use it only if needed    The anxiety is causing her blood pressure also to go up  Blood pressure is uncontrolled today    Chronic pain -still present  Possible fibnromyalgia- seen Rheum 1-2 yrs ago-still has not set that appointment  did not take med prescribed  Will refer again    Panc cyst - being monitored  [place]  set appt with GI- pt will call    HTN - chronic  Continuing verapamil  Pt to fu closely with cardio  Anxiety making it worse, BP high in office- just took med per pt  Monitor at [facility] and send mychart msg  Risk of high BP - pt aware including bleed, CVA again    DM- fu with endo  Labs today- check if adding jardiance has helped  Seeing retina specialist- last seen July    Reprinted neck US- told to schedule again      Family history with daughter diagnosed with breast cancer  She is due for screening mammogram-ordered 10/2021 but never did it - agrees to set Appointment    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      This note was generated by the Epic EMR system/ Dragon speech recognition and may contain inherent errors or omissions not intended by the user. Grammatical errors, random word insertions, deletions, pronoun errors and incomplete sentences are occasional consequences of this technology due to software limitations. Not all errors are caught or corrected. If there are questions or concerns about the content of this note or information contained within the body of this dictation they should be addressed directly with the author for clarification      Fable Huisman Bernadene Personhangavel Lewis Grivas, MD

## 2022-08-10 LAB — LIPID PANEL
Cholesterol / HDL Ratio: 4.5 Index
Cholesterol: 213 mg/dL — ABNORMAL HIGH (ref 0–199)
HDL: 47 mg/dL (ref 40–9999)
LDL Calculated: 140 mg/dL — ABNORMAL HIGH (ref 0–99)
Triglycerides: 130 mg/dL (ref 34–149)
VLDL Calculated: 26 mg/dL (ref 10–40)

## 2022-08-10 LAB — T4, FREE: T4 Free: 1 ng/dL (ref 0.69–1.48)

## 2022-08-10 LAB — COMPREHENSIVE METABOLIC PANEL
ALT: 15 U/L (ref 0–55)
AST (SGOT): 19 U/L (ref 5–41)
Albumin/Globulin Ratio: 1.3 (ref 0.9–2.2)
Albumin: 3.9 g/dL (ref 3.5–5.0)
Alkaline Phosphatase: 89 U/L (ref 37–117)
Anion Gap: 10 (ref 5.0–15.0)
BUN: 15 mg/dL (ref 7.0–21.0)
Bilirubin, Total: 0.3 mg/dL (ref 0.2–1.2)
CO2: 26 mEq/L (ref 17–29)
Calcium: 9.1 mg/dL (ref 8.5–10.5)
Chloride: 104 mEq/L (ref 99–111)
Creatinine: 0.9 mg/dL (ref 0.4–1.0)
Globulin: 3.1 g/dL (ref 2.0–3.6)
Glucose: 133 mg/dL — ABNORMAL HIGH (ref 70–100)
Potassium: 4.3 mEq/L (ref 3.5–5.3)
Protein, Total: 7 g/dL (ref 6.0–8.3)
Sodium: 140 mEq/L (ref 135–145)
eGFR: >60 mL/min/{1.73_m2} (ref >=60)

## 2022-08-10 LAB — HEMOLYSIS INDEX(SOFT): Hemolysis Index: 6 Index (ref 0–24)

## 2022-08-10 LAB — TSH: TSH: 2.85 u[IU]/mL (ref 0.35–4.94)

## 2022-08-22 ENCOUNTER — Ambulatory Visit (INDEPENDENT_AMBULATORY_CARE_PROVIDER_SITE_OTHER): Payer: Medicare Other | Admitting: "Endocrinology

## 2022-08-24 ENCOUNTER — Encounter (INDEPENDENT_AMBULATORY_CARE_PROVIDER_SITE_OTHER): Payer: Self-pay

## 2022-09-23 ENCOUNTER — Encounter (INDEPENDENT_AMBULATORY_CARE_PROVIDER_SITE_OTHER): Payer: Self-pay

## 2022-10-06 ENCOUNTER — Other Ambulatory Visit (INDEPENDENT_AMBULATORY_CARE_PROVIDER_SITE_OTHER): Payer: Self-pay | Admitting: Cardiovascular Disease

## 2022-10-06 ENCOUNTER — Other Ambulatory Visit (HOSPITAL_BASED_OUTPATIENT_CLINIC_OR_DEPARTMENT_OTHER): Payer: Self-pay | Admitting: Internal Medicine

## 2022-10-06 DIAGNOSIS — E1165 Type 2 diabetes mellitus with hyperglycemia: Secondary | ICD-10-CM

## 2022-10-06 NOTE — Telephone Encounter (Signed)
Prescription sent to mail order pharmacy as requested.

## 2022-10-24 ENCOUNTER — Encounter (INDEPENDENT_AMBULATORY_CARE_PROVIDER_SITE_OTHER): Payer: Self-pay

## 2022-10-26 ENCOUNTER — Ambulatory Visit (INDEPENDENT_AMBULATORY_CARE_PROVIDER_SITE_OTHER): Payer: Medicare Other | Admitting: Internal Medicine

## 2022-10-26 ENCOUNTER — Encounter (HOSPITAL_BASED_OUTPATIENT_CLINIC_OR_DEPARTMENT_OTHER): Payer: Self-pay | Admitting: Internal Medicine

## 2022-10-26 VITALS — BP 163/102 | HR 71 | Temp 98.0°F | Resp 18 | Ht 66.0 in | Wt 202.8 lb

## 2022-10-26 DIAGNOSIS — I10 Essential (primary) hypertension: Secondary | ICD-10-CM

## 2022-10-26 DIAGNOSIS — E1165 Type 2 diabetes mellitus with hyperglycemia: Secondary | ICD-10-CM

## 2022-10-26 DIAGNOSIS — E669 Obesity, unspecified: Secondary | ICD-10-CM

## 2022-10-26 DIAGNOSIS — Z794 Long term (current) use of insulin: Secondary | ICD-10-CM

## 2022-10-26 DIAGNOSIS — Z6832 Body mass index (BMI) 32.0-32.9, adult: Secondary | ICD-10-CM

## 2022-10-26 LAB — POCT GLUCOSE: Whole Blood Glucose POCT: 165 mg/dL — AB (ref 70–100)

## 2022-10-26 NOTE — Progress Notes (Signed)
Subjective:      Date: 10/26/2022 9:29 AM   Patient ID: Emily Rowe is a 72 y.o. female.    Chief Complaint:  Chief Complaint   Patient presents with    Diabetes     BS:  165         HPI  Visit type: follow-up - type II DM   Diagnosed: over 5 years ago  Type: Insulin requiring  Complications: hyperglycemia  Control: inadequate control  Comorbid illness: hyperlipidemia  Last follow-up:  11/10/21 with Dr. Kenney Houseman      Diabetes Medication Regimen: Glipizide XL 20 mg daily, jardiance 10 mg daily , Lantus 10 units daily at night (not taking lantus at present), taking Lantus only if HS glucose is 170 or above. Did not try jardiance.     Prior medications: Unable to tolerate Metformin due to diarrhea, unable to tolerate Victoza due to n/v, invokana. Levemir (caused nausea and vomiting), Tyler Aas (did not tolerate, not sure reason for intolerance)     Taking potassium pill daily along with eating sweet potato and avocado everyday.     Home glucose readings:  recall. Not consistent checking BG.  Fasting: 120s  After lunch: 150s        Podiatric assessment: admits LE burning/tingling due to hx of stroke   Exercise:  walks occasionally   Diet: moderate compliance with recommended diet;   Response to therapy: with hemoglobin A1C above goal  A1c trend: improving     Lab Results   Component Value Date    HGBA1C 9.0 (H) 08/09/2022    HGBA1C 8.9 (A) 05/11/2022    HGBA1C 8.2 (H) 02/08/2022     Trend is above goal  Additional concerns: In addition, pt has hyperlipidemia and is compliant with lipid therapy.  Pt denies side effects of lipid therapy - specifically denies myalgia., In addition, pt has stable essential HTN with no evidence of CHF or proteinuria.  The patient is compliant with anti-hypertensive therapy and denies side effects to therapy.  Pt denies CP, SOB, dizziness, orthopnea, PND or edema.     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     Hypertension  - reports ambulatory  BP usually in the 120s-130s/80s  - POC BP significantly elevated  - no evidence of CHF, denies chest pain, heart palpitations or fluid retention      FBG ranging now 120-130s at present.    24h diet recall:  BF- 1 cup coffee, wheat toast, cheese  Lunch- Sandwich with [place], arugula, tomatoes  Dinner/ Snack: Salad. Egg, oatmeal, tea    ROS:  A complete 12 point ROS was obtained. Pertinent positives and negatives as noted in HPI. All other systems are negative.      Vitals:  BP (!) 170/95 (BP Site: Left arm, Patient Position: Sitting, Cuff Size: Large)   Pulse 74   Temp 98 F (36.7 C) (Temporal)   Resp 18   Ht 1.676 m (5\' 6" )   Wt 92 kg (202 lb 12.8 oz)   SpO2 95%   BMI 32.73 kg/m    Wt Readings from Last 3 Encounters:   10/26/22 92 kg (202 lb 12.8 oz)   08/09/22 95.3 kg (210 lb)   05/11/22 95.8 kg (211 lb 3.2 oz)     BP is high today 162/95 repeat measurement. Patient states that she just took her BP medication.      Physical Exam   Constitutional: She  is oriented to person, place, and time. She appears well-developed and well-nourished.   HENT:   Head: Normocephalic and atraumatic.   Mouth/Throat: Oropharynx is clear and moist.   Eyes: Conjunctivae and EOM are normal.    Neck: Normal range of motion. No thyromegaly present.   Neurological: She is alert and oriented to person, place, and time.   Psychiatric: She has a normal mood and affect.   Normal sensation on 10g monofilament testing RLE, slightly decreased sensation LLE.        Assessment and Plan     Diagnoses and plan were reviewed and updated    1. Uncontrolled type 2 diabetes mellitus with hyperglycemia  -   Lab Results   Component Value Date    HGBA1C 9.0 (H) 08/09/2022    HGBA1C 8.9 (A) 05/11/2022    HGBA1C 8.2 (H) 02/08/2022       Current A1C is elevated above goal.  -  Recommend increasing the dose of SGLT-2 Inhibitor therapy.  Side effects of SGLT2 inhibitor (UTI, mycotic infection, polyuria, change in renal function, hypotension) discussed  with patient.  -  Recommend home glucometer testing twice a day (pre-breakfast or 2 hrs post-prandial).  Risk & Benefits of the new medication(s) were explained to the patient (and family) who verbalized understanding & agreed to the treatment plan. Patient (family) encouraged to contact me/clinical staff with any questions/concerns  Reports good glycemic control when switching from tradjenta to jardiance.  -  recommend carb consistent diet and stay physically active as tolerated  - referred to CDE for diet counseling, new referral entered, prefers in person appointment.    Completed diabetes education group classes 3 years ago and requesting 1:1 support for education.    - empagliflozin (JARDIANCE) 10 MG tablet; Take 1 tablet (10 mg) by mouth every morning  Dispense: 90 tablet; Refill: 1  - Urine Microalbumin Random  - Hemoglobin A1C  - Comprehensive metabolic panel  - TSH  - Ambulatory referral to Diabetic Education; Future  - Urine Microalbumin Random; Future  - Hemoglobin A1C; Future  - Comprehensive metabolic panel; Future  - Lipid panel; Future    2. Obesity due to excess calories (BMI 32-32.9)  - Recommend calorie restricted diet and exercise 150 min/week combined cardio and strength training   - patient has hx of intolerance to glp-1 ra   - referred to CDE     3. Mixed hyperlipidemia  Lab Results   Component Value Date    CHOL 213 (H) 08/09/2022    CHOL 206 (H) 02/08/2022    CHOL 216 (H) 11/02/2021     Lab Results   Component Value Date    HDL 47 08/09/2022    HDL 49 02/08/2022    HDL 38 (L) 11/02/2021     Lab Results   Component Value Date    LDL 140 (H) 08/09/2022    LDL 135 (H) 02/08/2022    LDL 144 (H) 11/02/2021     Lab Results   Component Value Date    TRIG 130 08/09/2022    TRIG 108 02/08/2022    TRIG 168 (H) 11/02/2021       - LDL is elevated  - hx of taking Repatha, reports he stopped Repatha because it has increased sugars  On Rosuvastatin 40 mg QHS at present.    - rosuvastatin (CRESTOR) 40 MG  tablet; Take 1 tablet (40 mg) by mouth daily  Dispense: 90 tablet; Refill: 1  - Comprehensive metabolic panel  -  Lipid panel  - Comprehensive metabolic panel; Future  - Lipid panel; Future    4. Essential hypertension  - per report ambulatory BP is well controlled, has element of white coat HTN.   - on Losartan 100 mg daily     - Comprehensive metabolic panel  - Comprehensive metabolic panel; Future    5. Long-term insulin use    RTC in 4 months    Andrez Grime MD MPH  Endocrinologist, [facility]

## 2022-11-03 ENCOUNTER — Ambulatory Visit (INDEPENDENT_AMBULATORY_CARE_PROVIDER_SITE_OTHER): Payer: Medicare Other | Admitting: Cardiovascular Disease

## 2022-11-04 ENCOUNTER — Other Ambulatory Visit (INDEPENDENT_AMBULATORY_CARE_PROVIDER_SITE_OTHER): Payer: Self-pay | Admitting: Family Medicine

## 2022-11-04 DIAGNOSIS — I10 Essential (primary) hypertension: Secondary | ICD-10-CM

## 2022-11-15 ENCOUNTER — Encounter (INDEPENDENT_AMBULATORY_CARE_PROVIDER_SITE_OTHER): Payer: Self-pay | Admitting: Family Medicine

## 2022-11-17 ENCOUNTER — Ambulatory Visit (INDEPENDENT_AMBULATORY_CARE_PROVIDER_SITE_OTHER): Payer: Medicare Other | Admitting: Family Medicine

## 2022-11-17 ENCOUNTER — Encounter (INDEPENDENT_AMBULATORY_CARE_PROVIDER_SITE_OTHER): Payer: Self-pay | Admitting: Family Medicine

## 2022-11-17 VITALS — BP 144/91 | HR 65 | Temp 97.8°F | Resp 16 | Ht 64.75 in | Wt 202.8 lb

## 2022-11-17 DIAGNOSIS — M899 Disorder of bone, unspecified: Secondary | ICD-10-CM

## 2022-11-17 DIAGNOSIS — Z0000 Encounter for general adult medical examination without abnormal findings: Secondary | ICD-10-CM

## 2022-11-17 DIAGNOSIS — H409 Unspecified glaucoma: Secondary | ICD-10-CM

## 2022-11-17 DIAGNOSIS — E1142 Type 2 diabetes mellitus with diabetic polyneuropathy: Secondary | ICD-10-CM

## 2022-11-17 DIAGNOSIS — G8929 Other chronic pain: Secondary | ICD-10-CM

## 2022-11-17 DIAGNOSIS — E782 Mixed hyperlipidemia: Secondary | ICD-10-CM

## 2022-11-17 DIAGNOSIS — I69359 Hemiplegia and hemiparesis following cerebral infarction affecting unspecified side: Secondary | ICD-10-CM

## 2022-11-17 DIAGNOSIS — Z780 Asymptomatic menopausal state: Secondary | ICD-10-CM

## 2022-11-17 DIAGNOSIS — M2550 Pain in unspecified joint: Secondary | ICD-10-CM

## 2022-11-17 DIAGNOSIS — Z1231 Encounter for screening mammogram for malignant neoplasm of breast: Secondary | ICD-10-CM

## 2022-11-17 DIAGNOSIS — I779 Disorder of arteries and arterioles, unspecified: Secondary | ICD-10-CM

## 2022-11-17 DIAGNOSIS — R899 Unspecified abnormal finding in specimens from other organs, systems and tissues: Secondary | ICD-10-CM

## 2022-11-17 DIAGNOSIS — R5382 Chronic fatigue, unspecified: Secondary | ICD-10-CM

## 2022-11-17 DIAGNOSIS — K862 Cyst of pancreas: Secondary | ICD-10-CM

## 2022-11-17 DIAGNOSIS — E1165 Type 2 diabetes mellitus with hyperglycemia: Secondary | ICD-10-CM

## 2022-11-17 DIAGNOSIS — Z13820 Encounter for screening for osteoporosis: Secondary | ICD-10-CM

## 2022-11-17 DIAGNOSIS — F32A Depression, unspecified: Secondary | ICD-10-CM

## 2022-11-17 DIAGNOSIS — R519 Headache, unspecified: Secondary | ICD-10-CM

## 2022-11-17 DIAGNOSIS — I10 Essential (primary) hypertension: Secondary | ICD-10-CM

## 2022-11-17 DIAGNOSIS — M25512 Pain in left shoulder: Secondary | ICD-10-CM

## 2022-11-17 DIAGNOSIS — D649 Anemia, unspecified: Secondary | ICD-10-CM

## 2022-11-17 LAB — COMPREHENSIVE METABOLIC PANEL
ALT: 16 U/L (ref 0–55)
AST (SGOT): 19 U/L (ref 5–41)
Albumin/Globulin Ratio: 1.2 (ref 0.9–2.2)
Albumin: 3.8 g/dL (ref 3.5–5.0)
Alkaline Phosphatase: 89 U/L (ref 37–117)
Anion Gap: 9 (ref 5.0–15.0)
BUN: 14 mg/dL (ref 7.0–21.0)
Bilirubin, Total: 0.4 mg/dL (ref 0.2–1.2)
CO2: 26 mEq/L (ref 17–29)
Calcium: 8.7 mg/dL (ref 8.5–10.5)
Chloride: 106 mEq/L (ref 99–111)
Creatinine: 0.8 mg/dL (ref 0.4–1.0)
Globulin: 3.1 g/dL (ref 2.0–3.6)
Glucose: 166 mg/dL — ABNORMAL HIGH (ref 70–100)
Potassium: 4.1 mEq/L (ref 3.5–5.3)
Protein, Total: 6.9 g/dL (ref 6.0–8.3)
Sodium: 141 mEq/L (ref 135–145)
eGFR: >60 mL/min/{1.73_m2} (ref >=60)

## 2022-11-17 LAB — CBC AND DIFFERENTIAL
Absolute NRBC: 0 10*3/uL (ref 0.00–0.00)
Basophils Absolute Automated: 0.03 10*3/uL (ref 0.00–0.08)
Basophils Automated: 0.5 %
Eosinophils Absolute Automated: 0.02 10*3/uL (ref 0.00–0.44)
Eosinophils Automated: 0.3 %
Hematocrit: 43.2 % (ref 34.7–43.7)
Hgb: 13.6 g/dL (ref 11.4–14.8)
Immature Granulocytes Absolute: 0.02 10*3/uL (ref 0.00–0.07)
Immature Granulocytes: 0.3 %
Instrument Absolute Neutrophil Count: 3.9 10*3/uL (ref 1.10–6.33)
Lymphocytes Absolute Automated: 1.93 10*3/uL (ref 0.42–3.22)
Lymphocytes Automated: 30.9 %
MCH: 27.7 pg (ref 25.1–33.5)
MCHC: 31.5 g/dL (ref 31.5–35.8)
MCV: 88 fL (ref 78.0–96.0)
MPV: 13.6 fL — ABNORMAL HIGH (ref 8.9–12.5)
Monocytes Absolute Automated: 0.35 10*3/uL (ref 0.21–0.85)
Monocytes: 5.6 %
Neutrophils Absolute: 3.9 10*3/uL (ref 1.10–6.33)
Neutrophils: 62.4 %
Nucleated RBC: 0 /100 WBC (ref 0.0–0.0)
Platelets: 151 10*3/uL (ref 142–346)
RBC: 4.91 10*6/uL (ref 3.90–5.10)
RDW: 14 % (ref 11–15)
WBC: 6.25 10*3/uL (ref 3.10–9.50)

## 2022-11-17 LAB — URINE MICROALBUMIN, RANDOM
Urine Creatinine, Random: 90.5 mg/dL
Urine Microalbumin, Random: 16 ug/ml (ref 0.0–30.0)
Urine Microalbumin/Creatinine Ratio: 18 ug/mg (ref 0–30)

## 2022-11-17 LAB — LIPID PANEL
Cholesterol / HDL Ratio: 5.5 Index
Cholesterol: 249 mg/dL — ABNORMAL HIGH (ref 0–199)
HDL: 45 mg/dL (ref 40–9999)
LDL Calculated: 176 mg/dL — ABNORMAL HIGH (ref 0–99)
Triglycerides: 142 mg/dL (ref 34–149)
VLDL Calculated: 28 mg/dL (ref 10–40)

## 2022-11-17 LAB — SEDIMENTATION RATE: Sed Rate: 31 mm/Hr — ABNORMAL HIGH (ref 0–20)

## 2022-11-17 LAB — RHEUMATOID FACTOR: Rheumatoid Factor: <13 IU/mL (ref 0.0–30.0)

## 2022-11-17 LAB — VITAMIN D, 25 OH, TOTAL: Vitamin D, 25 OH, Total: 15 ng/mL — ABNORMAL LOW (ref 30–100)

## 2022-11-17 LAB — HEMOGLOBIN A1C
Average Estimated Glucose: 214.5 mg/dL
Hemoglobin A1C: 9.1 % — ABNORMAL HIGH (ref 4.6–5.6)

## 2022-11-17 LAB — TSH: TSH: 2.1 u[IU]/mL (ref 0.35–4.94)

## 2022-11-17 LAB — HEMOLYSIS INDEX(SOFT): Hemolysis Index: 6 Index (ref 0–24)

## 2022-11-17 NOTE — Progress Notes (Signed)
Century is a 72 y.o. female who presents today for the following Medicare Wellness Visit:  []  Initial Preventive Physical Exam (IPPE) - "Welcome to Medicare" preventive visit (Vision Screening required)   []  Annual Wellness Visit - Initial  [x]  Annual Wellness Visit - Subsequent       Health Risk Assessment:   During the past month, how would you rate your general health?:  Poor  Which of the following tasks can you do without assistance - drive or take the bus alone; shop for groceries or clothes; prepare your own meals; do your own housework/laundry; handle your own finances/pay bills; eat, bathe or get around your home?: Drive or take the bus alone, Handle your own finances/pay bills, Shop for groceries or clothes, Eat, bathe, dress or get around your home, Prepare your own meals, Do your own housework/laundry  Which of the following problems have you been bothered by in the past month - dizzy when standing up; problems using the phone; feeling tired or fatigued; moderate or severe body pain?: Dizzy when standing up, Feeling tired or fatigued, Moderate or severe body pain  Do you exercise for about 20 minutes 3 or more days per week?:Yes  During the past month was someone available to help if you needed and wanted help?  For example, if you felt nervous, lonely, got sick and had to stay in bed, needed someone to talk to, needed help with daily chores or needed help just taking care of yourself.: Yes  Do you always wear a seat belt?: Yes  Do you have any trouble taking medications the way you have been told to take them?: No  Have you been given any information that can help you with keeping track of your medications?: Yes  Do you have trouble paying for your medications?: No  Have you been given any information that can help you with hazards in your house, such as scatter rugs, furniture, etc?: Yes  Do you feel unsteady when standing or walking?: Yes  Do you worry  about falling?: Yes  Have you fallen two or more times in the past year?: No  Did you suffer any injuries from your falls in the past year?: No       Additional Concerns    BP high- just took it  DM- seeing Endo, on jardiance 2 months, on lantus, she stopped tradjenta  BS 120 Fasting, 150's during the day     Depression - seen Psychiatrist  Chronic fatigue- with hx CVA- Right sided weakness, arthritis on left arm - so hard to function  5 yrs ago I referred and seen a rheum    Appropriate screenings:,   - Colorectal cancer screening- 2013 and has appt 09/18/19, 2021  Seen Dr Silvestre Gunner bout panc cyst-  MRI:  IMPRESSION:   Interval stability of cystic lesion in the tail of pancreas measuring up to  1.7 x 1.5 cm. 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  Electronically signed by: Tyrone Sage M.D.  [facility]   FEL: 08/08/22    - Mammography 09/02/19., 10/2020  No pain  Not feeling lump    - Bone density testing-due  - pap - Has had hysterectomy- with BSO - for excess bleeding    - Hepatitis C - UTD    VACCINATION:  - Influenza UTD  -pneumococcal-UTD  - RSV- to do pharmacy  - hepatitis b-DUE_ told many times to do  at pharmacy   - shingles vaccine- Shingrix-due  - TDaP vaccinations 2013    Nonsmoker  No alcohol use    Seen ophthal - no DM issues per pt  Seeing one in [place]- seen 2 weeks ago  Told glaucoma    Seeing Ortho in [place] - arthritis and sports    Podiatrist - sees dr Richardson Landry    Feels head pressure  Has Appointment to see her Recommend neurology consult for further diagnostic intervention. Soon - Dr Dillard Cannon  No vomiting    Care Team:   Patient Care Team:  Jamey Ripa, MD as PCP - General (Family Medicine)  Beecher Mcardle, MD  Albertina Senegal, MD as Consulting Physician (Gastroenterology)  Roma Kayser, MD as Consulting Physician (Rheumatology)  Jamey Ripa, MD  Judeth Cornfield, MD  Herby Abraham, [place]  Margorie John, MD as Consulting Physician  (Cardiology)  Lina Sayre as Technician  Mal Misty, MD as Consulting Physician (Interventional Cardiology)  Toni Amend, RN as Registered Nurse  Mliss Sax, Freddi Che, MD as Consulting Physician (Interventional Cardiology)  Shela Leff as Technician  Ilda Mori, RN as Registered Nurse  Mychart, Generic Provider (Inactive)  Wallene Huh, RN as Registered Nurse  Barbarann Ehlers, RN as Registered Nurse  Provider, Generic Hpf (Inactive)  Theodoro Clock, [place] as Physician Assistant (Physician Assistant)      Hospitalizations:   Hospitalization within past year: [x]  No  []  Yes     Diagnosis:      Screenings:       02/25/2022     8:38 AM 11/17/2022     8:04 AM 11/17/2022     8:08 AM   Ambulatory Screenings   Falls Risk: Fallen more than 2 times in past year N N N   Falls Risk: Suffer any injuries? N N N   Depression: PHQ2 Total Score  0    Depression: PHQ9 Total Score  0         Substance Use Disorder Screen:  In the past year, how often have you used the following?  1) Alcohol (For men, 5 or more drinks a day. For women, 4 or more drinks a day)  [x]  Never []  Once or Twice []  Monthly []  Weekly []  Daily or Almost Daily  2) Tobacco Products  [x]  Never []  Once or Twice []  Monthly []  Weekly []  Daily or Almost Daily  3) Prescription Drugs for Non-Medical Reasons  [x]  Never []  Once or Twice []  Monthly []  Weekly []  Daily or Almost Daily  4) Illegal Drugs  [x]  Never []  Once or Twice []  Monthly []  Weekly []  Daily or Almost Daily             Functional Ability/Level of Safety:   Falls Risk/Home Safety Assessment:  ( see HRA and Screenings sections for additional assessment)  Home Safety: [x]  Stair handrails  [x]  Skid-resistant rugs/remove throw rugs   [x]  Grab bars  [x]  Clear pathways between rooms  [x]  Proper lighting stairs/ bathrooms/bedrooms  Get Up and Go (optional):  []   <20 secs  []   >20 secs    []   High risk for falls - Home Safety/Falls Risk Precautions reviewed with pt/family    Hearing Assessment:  Concerns for  hearing loss: []  Yes  [x]   No  Hearing aids:   []   Right  []   Left  []   Bilateral   []   None  Whisper Test (optional):  []  Normal  []   Slightly decreased  []   Significantly decreased    Exercise:  Frequency:  []   No formal exercise  []   1-2x/wk  [x]   3-4x/wk  []   >4x/wk  Duration:  [x]   15-30 mins/day  []   30-45 mins/day  []   45+ mins/day  Intensity:  []   Light  [x]   Moderate  []   Heavy        Activities of Daily Living:   ADL's Independent Minimal  Assistance Moderate  Assistance Total   Assistance   Bathing [x]  []  []  []    Dressing [x]  []  []  []    Mobility   [x]  []  []  []    Transfer [x]  []  []  []    Eating [x]  []  []  []    Toileting [x]  []  []  []      IADL's Independent Minimal  Assistance Moderate  Assistance Total   Assistance   Phone [x]  []  []  []    Housekeeping [x]  []  []  []    Laundry [x]  []  []  []    Transportation [x]  []  []  []    Medications [x]  []  []  []    Finances [x]  []  []  []       ADL assistance: [x]  No assistance needed  []  Spouse  []  Sibling  []  Son   []  Daughter []  Children  []  Home Health Aide []  Other:       Advance Care Planning:   Discussion of Advance Directives:   []  Advance Directive in chart  []  Advance Directive not in chart - requested to provide []  No Advance Directive.  Form Provided  []  No Advance Directive.  Pt declines. []  Not addressed today  []  Other:     Exam:   BP (!) 144/91 (BP Site: Left arm, Patient Position: Sitting, Cuff Size: Large)   Pulse 65   Temp 97.8 F (36.6 C) (Oral)   Resp 16   Ht 1.645 m (5' 4.75")   Wt 92 kg (202 lb 13.2 oz)   SpO2 97%   BMI 34.01 kg/m      Physical Exam  Musculoskeletal:      Comments: Right hand cannot lift above 60 degree  Sig arthritis IP jt B hands  Sig thenar wasting R>L  Flexion def right hand          Alert and oriented. Affect is normal and appropriate. Clear thought process with normal reasoning and conversational tone and logic. No psychomotor retardation. Not in any acute distress  Skin: no rash or abnormal lesions  Eyes - normal eye  movements.  Pupils equal and reactive to light  Eyelid normal on exam  Ears - TM clear, external ear normal  Nose - no acute changes  Throat - Oral mucosa moist and pink.  Neck- LEFT SIDE neck thyroid nodule, nontender, no palpable cervical lymph nodes  HEART: S1S2 heard, regular rate and rhythm  LUNGS: good air exchange, no crackles or ronchi or wheezing  ABDOMEN: Positive bowel sounds, no hepatospleenomegaly appreciated, no tenderness   Back: Normal curvature, no tenderness.   Extremities: FROM, no deformities, no edema, no erythema.   No knee effusion.   mild tendernesson the joint line.  Neuro: Physiological, speech better than before.   Right side weakness.   Restricted and only able to abduct <20%, Right leg weakness.   Strength RUE and RLE 3/5.      SPEECH- since stroke some stiffness with speech  Psych-good eye contact  Train of thought normal  Judgment normal        Vision Screening (Required for IPPE):  Uncorrected Near Uncorrected  Far   Right Eye 20/ 20/50   Left Eye 20/ 20/40   Both Eyes 20/ 20/40      Corrected  Near Corrected  Far   Right Eye 20/ 20/   Left Eye 20/ 20/   Both Eyes 20/ 20/     []  Patient refused vision screening     Evaluation of Cognitive Function:   Mood/affect: [x]  Appropriate  []   Other:   Appearance: [x]  Neatly groomed  [x]  Adequately nourished  []  Other:  Family member/caregiver input: []  Present - no concerns  []   Not present in room  []  Present - concerns:    Cognitive Assessment:  Mini-Cog Result (three word registration- banana, sunrise, chair / clock drawing):   [x]   > 3 points - negative screen for dementia   []  3 recalled words - negative screen for dementia   []  1-2 recalled words and normal clock draw - negative for cognitive impairment   []  1-2 recalled words and abnormal clock draw - positive for cognitive impairment   []  0 recalled words - positive for cognitive impairment         Assessment/Plan:   1. Routine general medical examination at a health care  facility  - Vitamin D,25 OH, Total    2. History of hemorrhagic stroke with residual hemiparesis  - Comprehensive metabolic panel  - Hemoglobin A1C  - Lipid panel  - Urine Microalbumin Random  - ANA IFA w reflex to Titer/Pattern/Antibody  - CBC and differential  - Vitamin D,25 OH, Total  - TSH  - Rheumatoid factor  - Sedimentation rate (ESR)  - MRI Brain WO Contrast; Future  - Ambulatory referral to Physical Therapy; Future    3. Bilateral carotid artery disease, unspecified type  - Vitamin D,25 OH, Total    4. Depression, unspecified depression type  - Comprehensive metabolic panel  - Hemoglobin A1C  - Lipid panel  - Urine Microalbumin Random  - ANA IFA w reflex to Titer/Pattern/Antibody  - CBC and differential  - Vitamin D,25 OH, Total  - TSH  - Rheumatoid factor  - Sedimentation rate (ESR)    5. Morbid (severe) obesity due to excess calories  - Vitamin D,25 OH, Total    6. Uncontrolled type 2 diabetes mellitus with hyperglycemia  - Vitamin D,25 OH, Total  - Ambulatory referral to Nutrition Services; Future    7. Mixed hyperlipidemia  - Comprehensive metabolic panel  - Hemoglobin A1C  - Lipid panel  - Urine Microalbumin Random  - ANA IFA w reflex to Titer/Pattern/Antibody  - CBC and differential  - Vitamin D,25 OH, Total  - TSH  - Rheumatoid factor  - Sedimentation rate (ESR)  - Ambulatory referral to Nutrition Services; Future    8. Essential hypertension  - Comprehensive metabolic panel  - Hemoglobin A1C  - Lipid panel  - Urine Microalbumin Random  - ANA IFA w reflex to Titer/Pattern/Antibody  - CBC and differential  - Vitamin D,25 OH, Total  - TSH  - Rheumatoid factor  - Sedimentation rate (ESR)  - Ambulatory referral to Nutrition Services; Future    9. 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 Anemia, unspecified type  - Comprehensive metabolic panel  - Hemoglobin A1C  - Lipid panel  - Urine Microalbumin Random  - ANA IFA w reflex to Titer/Pattern/Antibody  - CBC and differential  - Vitamin D,25 OH, Total  - TSH  - Rheumatoid factor  - Sedimentation rate (ESR)    12. Arthralgia, unspecified joint  - Referral to Rheumatology ([place]); Future  - Comprehensive metabolic panel  - Hemoglobin A1C  - Lipid panel  - Urine Microalbumin Random  - ANA IFA w reflex to Titer/Pattern/Antibody  - CBC and differential  - Vitamin D,25 OH, Total  - TSH  - Rheumatoid factor  - Sedimentation rate (ESR)  - Ambulatory referral to Physical Therapy; Future  - Referral to Orthopaedic Sports Med ([place]); Future    13. Chronic fatigue  - Referral to Rheumatology ([place]); Future  - Comprehensive metabolic panel  - Hemoglobin A1C  - Lipid panel  - Urine Microalbumin Random  - ANA IFA w reflex to Titer/Pattern/Antibody  - CBC and differential  - Vitamin D,25 OH, Total  - TSH  - Rheumatoid factor  - Sedimentation rate (ESR)  - Ambulatory referral to Physical Therapy; Future  - Ambulatory referral to Nutrition Services; Future    14. Encounter for screening mammogram for breast cancer  - Vitamin D,25 OH, Total  - Mammo Screening 3D/Tomo Bilateral; Future    15. Postmenopausal  - Vitamin D,25 OH, Total  - Dxa Bone Density Axial Skeleton; Future    16. Osteoporosis screening  - Vitamin D,25 OH, Total  - Dxa Bone Density Axial Skeleton; Future    17. Glaucoma, unspecified glaucoma type, unspecified laterality  - Vitamin D,25 OH, Total  - MRI Brain WO Contrast; Future    18. Pressure in head  - Vitamin D,25 OH, Total  - MRI Brain WO Contrast; Future    19. Disorder of bone, unspecified  - Vitamin D,25 OH, Total    20. Abnormal laboratory test    21. Chronic left shoulder pain  - Referral to Orthopaedic Sports  Med (); Future    Significant arthritis, right side weakness from previous stroke, left shoulder pain, Bilat hand sig IP jt arthritis- all with extreme fatigue- causes fall risk  She is also getting tired walking with pain  Referring to PT- to eval and treat- and assist patient to get possibly wheelchair if ideal  Rheum w/u repeated-had some 5 yrs back-referring to rheum    Hx CVA-sees Neuro  Need medic alert, bracelet    Need mammo, DEXA    Complains of feeling pressure in head but no n/v, no change in s/s   Seeing neurology  Next week.  Ordered MRI brain        Return in about 4 weeks (around 12/15/2022).  Patient Instructions   Women's Preventive Wellness Plan  Today's Date: November 17, 2022    Patient [place]    Date of Birth: 12/26/1950     As part of your wellness benefit, Medicare makes many screening tests available to you at no charge.  A complete list of these tests can be found at their website, [email or web address]. However, many of these tests or recommendations are out of date, or may not apply to you. After careful consideration of your own personal health needs, the following testing is recommended for you:      Preventive Service  Up-to-date (UTD)/Due/Not Applicable (N/A)   Last Done   Medicare Frequency   Body Mass Index   Up-to-date November 17, 2022  (BMI):Body mass index is 34.01 kg/m.   Height:Height: 164.5 cm (5' 4.75")  Weight:Weight: 92 kg (202 lb 13.2 oz) Annually   Blood Pressure: Up-to-date November 17, 2022    BP: (!) 144/91   Every 2 yrs, if BP </= 120/80 mm hg  Annually, if BP >120-139/80-89 mm hg   Cholesterol Testing Up-to-date Lab Results   Component Value Date    LDL 140 (H) 08/09/2022     Regularly beginning at age 63 with risk factors   Diabetes Screening Up-to-date Lab Results   Component Value Date    GLU 133 (H) 08/09/2022      If prediabetes, one screening every 6 months  Otherwise, one screening  every 12 months with certain risk factors for diabetes   Osteoporosis Screening   (Bone Density Measurement)  Due  Routinely, for women aged 65+  Routinely, for women aged 63-64 with risk factors   Breast Cancer Screening   (Mammogram) Due  Every 2 yrs, aged 85-74 yrs   Cervical Cancer Screening   (Pap Smear)  Not applicable  Annually if at high risk for developing cervical or vaginal cancer or childbearing age with abnormal Pap test within past 3 years; Every 2 years for women at normal risk  HPV: Once every 5 years; All asymptomatic female Medicare beneficiaries aged 5 to 87 years   Colorectal Cancer Screening Up-to-date  Annually, Fecal Occult Blood Stool (FOBS)  Every 5 yrs, Sigmoidoscopy with FOBS  Every 10 yrs, Colonoscopy  Every 3 yrs, Cologuard   Depression Screening Up-to-date November 17, 2022  As necessary for those with risk factors   Sexually Transmitted Diseases (STDs) & HIV Screening Not applicable  As necessary for those with risk factors   Alcohol Misuse Screening Not applicable  As necessary for those with risk factors   Immunizations:   Tdap is due Immunization History   Administered Date(s) Administered    COVID-19 mRNA MONOVALENT vaccine BOOSTER 18 years and above (Moderna) 50 mcg/0.25 mL 09/27/2020    COVID-19 mRNA MONOVALENT vaccine PRIMARY SERIES 12 years and above (Moderna) 100 mcg/0.5 mL 12/25/2019, 01/22/2020    INFLUENZA HIGH DOSE 65 YRS+ Quad 0.7 mL 08/24/2020, 11/02/2021, 08/09/2022    Influenza (Im) Preserved TRIVALENT VACCINE 07/05/2007    Influenza vaccine quadrivalent recombinant 18 years and older (FLUBOK) single dose 0.5 mL (PF) 08/13/2019    Pneumococcal 23 valent 09/05/2019    Pneumococcal Conjugate 13-Valent 12/30/2020    Td 08/30/1995    Tdap 07/05/2007    Tetanus 09/04/2012    Prevnar 13: 1 dose after age 23  Pneumovax 64: 1 dose 1 year after Prevnar  Influenza: Annually   Advance Directive Due  Once; update as needed   Medical Nutrition Therapy Nutrionist referral guiven  As  necessary for diabetes or renal disease   Smoking Cessation Counseling Not applicable Counseling given: Not Answered   Frequency: two cessation attempts per year.   Glaucoma Screening Up-to-date  Annually for covered high risk Medicare beneficiaries (one of the following: DM, FHx Glaucoma, African-Americans aged 46+, 6 aged 65+)   Hepatitis C Virus (HCV) Screening Up-to-date  Annually only for high risk behavior  Once if born between [place] and are not considered high risk   Lung Cancer Screening Not applicable  Annually if asymptomatic, tobacco smoking history of at least 30 pack-years (one pack-year =  smoking one pack per day for one year; 1 pack = 20 cigarettes), and current smoker or one who has quit smoking within the last 15 years     Your major risk factors:       Diabetes, Hypertension, Obesity, and Sedentary lifestyle     Recommendations for improvement:    Low cholesterol diet, Compliance with prescription medications, Low carb diet, and Weight bearing exercises     Referrals:    See After Visit Summary orders            Jamey Ripa, MD    11/17/2022     The following sections were reviewed this encounter by the provider:   Tobacco  Allergies  Meds  Problems  Med Hx  Surg Hx  Fam Hx          History:   Patient Active Problem List   Diagnosis    Uncontrolled type 2 diabetes mellitus with hyperglycemia    Mixed hyperlipidemia    Obesity (BMI 30-39.9)    Neuropathy    Stroke    Essential hypertension    Diabetic peripheral neuropathy    History of hemorrhagic stroke with residual hemiparesis    Pancreas cyst    Breast pain, left    Perforated abdominal viscus    Anemia, unspecified type    Pure hypercholesterolemia, unspecified    Depression, unspecified depression type    Bilateral carotid artery disease, unspecified type    Fibromyalgia muscle pain    Morbid (severe) obesity due to excess calories      Past Medical History:   Diagnosis Date    Anxiety     Asthma      allergic reaction to mold, not since 20 years, HAS WHEEZING IN COLD WEATHER. NO INHALERS    Bowel perforation     COLONOSCOPY 09/2019    Depression     Diabetes     Difficulty walking     WEAK RIGHT LEG AFTER CVA 2021    Dysphagia     BIG PILLS ONLY     Gastroesophageal reflux disease     PREVACID OTC PRN     Hyperlipemia     Hypertensive disorder     Hypokalemia     CMP REQ PCP 09/14/20    Neuropathy     NUMBNESS RIGHT UE AND LE     Post-operative nausea and vomiting     ALWAYS WITH ANESTHESIA     Sleep apnea 2017    NO CPAP NEEDED PER PT     Stroke 2012     ARM, LEG WEAK. WALKS WITH CANE, WALKER    TIA (transient ischemic attack)     MULTIPLE BEFORE CVA 2012    Type 2 diabetes mellitus, controlled 2021    MANAGED BY DR Vertell Limber RAO. AM FSB 117-140. TAKES LANTUS PRN IN AM. HAIC 7.9     Past Surgical History:   Procedure Laterality Date    BREAST BIOPSY Left     benign lump biopsy    COLONOSCOPY      X 2    COLONOSCOPY N/A 09/17/2020    Procedure: COLONOSCOPY;  Surgeon: Albertina Senegal, MD;  Location: [facility];  Service: Gastroenterology;  Laterality: N/A;    EGD N/A 10/28/2020    Procedure: EGD w/ bx's;  Surgeon: Memory Argue, MD;  Location:  ENDOSCOPY OR;  Service: Gastroenterology;  Laterality: N/A;    HYSTERECTOMY      2005  OOPHORECTOMY Bilateral     2005    REDUCTION MAMMAPLASTY      TONSILLECTOMY      urinary bladder      BLADDER LIFT      Allergies   Allergen Reactions    Codeine Nausea And Vomiting    Crestor [Rosuvastatin] Rash     D/c due to severe rash and higher blood sugars    Glucosamine Hives and Nausea And Vomiting    Latex Rash    Tree Nuts Hives     ESPECIALLY ALMONDS     Dairy [Milk-Related Compounds]     Eggs Or Egg-Derived Products      Patient had an allergy test and stated that she was allergic to eggs    Gabapentin      Dizziness and rash, irregular heart rate    Gluten Meal     Invokana [Canagliflozin]     Levemir [Insulin Detemir] Nausea And Vomiting    Lyrica  [Pregabalin]      PT STATES IT MAKES HER NEUROPATHY WORSE STATES SHE FEELS LIKE SHE IS ON FIRE    Metformin      diarrhea    Sucralfate Nausea And Vomiting     BODY ACHES, FEVER    Tyler Aas [Insulin Degludec]     Victoza [Liraglutide] Nausea And Vomiting    Flaxseed [Flaxseed (Linseed)] Rash    Omega 3 [Fish Oil] Rash    Peanut-Containing Drug Products Rash      Outpatient Medications Marked as Taking for the 11/17/22 encounter (Office Visit) with Jamey Ripa, MD   Medication Sig Dispense Refill    aspirin 81 MG chewable tablet Chew 1 tablet (81 mg) by mouth twice a week      empagliflozin (Jardiance) 10 MG tablet Take 1 tablet (10 mg) by mouth daily 90 tablet 3    glipiZIDE (GLUCOTROL) 10 MG 24 hr tablet TAKE 1 TABLET BY MOUTH TWICE A DAY 180 tablet 0    glucose blood test strip Check sugar three times a day, ICD 10 E11.9 100 each 10    hydrocortisone 2.5 % cream as needed      insulin glargine (LANTUS SOLOSTAR) 100 UNIT/ML injection pen Inject 10 units into skin daily at bedtime ONLY AS NEEDED IF BLOOD SUGAR >OR = TO 170  LAST TAKEN 07/2020 15 mL 1    LORazepam (ATIVAN) 0.5 MG tablet Take 0.5 tablets (0.25 mg) by mouth nightly as needed for Anxiety 20 tablet 0    losartan (COZAAR) 50 MG tablet TAKE 1 TABLET(50 MG) BY MOUTH TWICE DAILY 60 tablet 3    omeprazole (PriLOSEC) 20 MG capsule 1 capsule (20 mg) as needed      rosuvastatin (CRESTOR) 40 MG tablet Take 1 tablet (40 mg) by mouth daily 90 tablet 1    verapamil ER (CALAN-SR) 120 MG CR tablet TAKE 1 TABLET(120 MG) BY MOUTH DAILY 90 tablet 0     Social History     Tobacco Use    Smoking status: Never     Passive exposure: Never    Smokeless tobacco: Never   Vaping Use    Vaping Use: Never used   Substance Use Topics    Alcohol use: Never    Drug use: Never      Family History   Problem Relation Age of Onset    Heart attack Mother     Diabetes Father     Diabetes Maternal Grandmother     Stroke Maternal Grandmother  Stroke Maternal Grandfather      Breast cancer Daughter 17    Colon cancer Neg Hx            ===================================================================    Additional Documentation:

## 2022-11-17 NOTE — Patient Instructions (Signed)
Women's Preventive Wellness Plan  Today's Date: November 17, 2022    Patient Emily Rowe    Date of Birth: [birth year]     As part of your wellness benefit, Medicare makes many screening tests available to you at no charge.  A complete list of these tests can be found at their website, [email or web address]. However, many of these tests or recommendations are out of date, or may not apply to you. After careful consideration of your own personal health needs, the following testing is recommended for you:      Preventive Service    Up-to-date (UTD)/Due/Not Applicable (N/A)   Last Done   Medicare Frequency   Body Mass Index   Up-to-date November 17, 2022  (BMI):Body mass index is 34.01 kg/m.   Height:Height: 164.5 cm (5' 4.75")  Weight:Weight: 92 kg (202 lb 13.2 oz) Annually   Blood Pressure: Up-to-date November 17, 2022    BP: (!) 144/91   Every 2 yrs, if BP </= 120/80 mm hg  Annually, if BP >120-139/80-89 mm hg   Cholesterol Testing Up-to-date Lab Results   Component Value Date    LDL 140 (H) 08/09/2022     Regularly beginning at age 98 with risk factors   Diabetes Screening Up-to-date Lab Results   Component Value Date    GLU 133 (H) 08/09/2022      If prediabetes, one screening every 6 months  Otherwise, one screening every 12 months with certain risk factors for diabetes   Osteoporosis Screening   (Bone Density Measurement)  Due  Routinely, for women aged 65+  Routinely, for women aged 30-64 with risk factors   Breast Cancer Screening   (Mammogram) Due  Every 2 yrs, aged 70-70 yrs   Cervical Cancer Screening   (Pap Smear)  Not applicable  Annually if at high risk for developing cervical or vaginal cancer or childbearing age with abnormal Pap test within past 3 years; Every 2 years for women at normal risk  HPV: Once every 5 years; All asymptomatic female Medicare beneficiaries aged 70 to 70 years   Colorectal Cancer Screening Up-to-date  Annually,  Fecal Occult Blood Stool (FOBS)  Every 5 yrs, Sigmoidoscopy with FOBS  Every 10 yrs, Colonoscopy  Every 3 yrs, Cologuard   Depression Screening Up-to-date November 17, 2022  As necessary for those with risk factors   Sexually Transmitted Diseases (STDs) & HIV Screening Not applicable  As necessary for those with risk factors   Alcohol Misuse Screening Not applicable  As necessary for those with risk factors   Immunizations:   Tdap is due Immunization History   Administered Date(s) Administered   . COVID-19 mRNA MONOVALENT vaccine BOOSTER 18 years and above (Moderna) 50 mcg/0.25 mL 09/27/2020   . COVID-19 mRNA MONOVALENT vaccine PRIMARY SERIES 12 years and above (Moderna) 100 mcg/0.5 mL 12/25/2019, 01/22/2020   . INFLUENZA HIGH DOSE 65 YRS+ Quad 0.7 mL 08/24/2020, 11/02/2021, 08/09/2022   . Influenza (Im) Preserved TRIVALENT VACCINE 07/05/2007   . Influenza vaccine quadrivalent recombinant 18 years and older (FLUBOK) single dose 0.5 mL (PF) 08/13/2019   . Pneumococcal 23 valent 09/05/2019   . Pneumococcal Conjugate 13-Valent 12/30/2020   . Td 08/30/1995   . Tdap 07/05/2007   . Tetanus 09/04/2012    Prevnar 13: 1 dose after age 51  Pneumovax 66: 1 dose 1 year after Prevnar  Influenza: Annually   Advance Directive Due  Once; update as needed   Medical Nutrition Therapy Nutrionist referral guiven  As  necessary for diabetes or renal disease   Smoking Cessation Counseling Not applicable Counseling given: Not Answered   Frequency: two cessation attempts per year.   Glaucoma Screening Up-to-date  Annually for covered high risk Medicare beneficiaries (one of the following: DM, FHx Glaucoma, African-Americans aged 70+, 70 aged 65+)   Hepatitis C Virus (HCV) Screening Up-to-date  Annually only for high risk behavior  Once if born between [place] and are not considered high risk   Lung Cancer Screening Not applicable  Annually if asymptomatic, tobacco smoking history of at least 30 pack-years (one pack-year  = smoking one pack per day for one year; 1 pack = 20 cigarettes), and current smoker or one who has quit smoking within the last 15 years     Your major risk factors:       Diabetes, Hypertension, Obesity, and Sedentary lifestyle     Recommendations for improvement:    Low cholesterol diet, Compliance with prescription medications, Low carb diet, and Weight bearing exercises     Referrals:    See After Visit Summary orders

## 2022-11-18 ENCOUNTER — Telehealth (INDEPENDENT_AMBULATORY_CARE_PROVIDER_SITE_OTHER): Payer: Self-pay | Admitting: Family Medicine

## 2022-11-18 DIAGNOSIS — E559 Vitamin D deficiency, unspecified: Secondary | ICD-10-CM

## 2022-11-18 LAB — EXTRACTABLE NUCLEAR ANTIGEN ANTIBODY
Centromere Antibody Interpretation: NEGATIVE
Centromere Antibody: 4 U/mL (ref 0–19)
Chromatin Antibody Interp: NEGATIVE
Chromatin Antibody: 3 U/mL (ref 0–19)
Double Stranded DNA(dsDNA)Antibody Interpretation: NEGATIVE
Double Stranded DNA(dsDNA)Antibody: 31 U/mL (ref 0–200)
Histone Antibody Interpretation: NEGATIVE
Histone Antibody: 0.5 U/mL (ref 0.0–0.9)
JO-1 Antibody Interpretation: NEGATIVE
Jo-1 Antibody: 2 U/mL (ref 0–19)
RNA Polymerase III Antibody Interpretation: NEGATIVE
RNA Polymerase III Antibody: 12 U/mL (ref 0–19)
Ribonucleoprotein (RNP) Antibody Interpretation: NEGATIVE
Ribonucleoprotein (RNP) Antibody: 4 U/mL (ref 0–19)
Ribosomal P Antibody Interpretation: NEGATIVE
Ribosomal P Antibody: 2 U/mL (ref 0–19)
Scleroderma (Scl-70) Antibody Interpretation: NEGATIVE
Scleroderma (Scl-70) Antibody: 3 U/mL (ref 0–19)
Sjogrens SSA (RO) Antibody Interpretation: NEGATIVE
Sjogrens SSA (Ro) Antibody: 2 U/mL (ref 0–19)
Sjogrens SSB (La) Antibody Interpretation: NEGATIVE
Sjogrens SSB (La) Antibody: 2 U/mL (ref 0–19)
Smith (Sm) Antibody Interpretation: NEGATIVE
Smith (Sm) Antibody: 4 U/mL (ref 0–19)

## 2022-11-18 LAB — ANA IFA WITH REFLEX TO TITER/PATTERN/ANTIBODY
ANA Screen: POSITIVE — AB
ANA Titer: 1:320 {titer} — AB

## 2022-11-18 LAB — LAB USE ONLY - ANA PATHOLOGY REVIEW

## 2022-11-18 MED ORDER — ERGOCALCIFEROL 1.25 MG (50000 UT) PO CAPS
50000.0000 [IU] | ORAL_CAPSULE | ORAL | 1 refills | Status: DC
Start: 2022-11-18 — End: 2023-05-05

## 2022-11-18 NOTE — Telephone Encounter (Signed)
See labs.  Vitamin D once a week supplement sent

## 2022-11-24 ENCOUNTER — Encounter (INDEPENDENT_AMBULATORY_CARE_PROVIDER_SITE_OTHER): Payer: Self-pay

## 2022-11-28 ENCOUNTER — Encounter (INDEPENDENT_AMBULATORY_CARE_PROVIDER_SITE_OTHER): Payer: Self-pay | Admitting: Cardiovascular Disease

## 2022-11-28 ENCOUNTER — Ambulatory Visit (INDEPENDENT_AMBULATORY_CARE_PROVIDER_SITE_OTHER): Payer: Medicare Other | Admitting: Cardiovascular Disease

## 2022-11-28 ENCOUNTER — Telehealth (INDEPENDENT_AMBULATORY_CARE_PROVIDER_SITE_OTHER): Payer: Self-pay

## 2022-11-28 VITALS — BP 152/84 | HR 78 | Ht 65.0 in | Wt 200.0 lb

## 2022-11-28 DIAGNOSIS — E1165 Type 2 diabetes mellitus with hyperglycemia: Secondary | ICD-10-CM

## 2022-11-28 DIAGNOSIS — I10 Essential (primary) hypertension: Secondary | ICD-10-CM

## 2022-11-28 DIAGNOSIS — F32A Depression, unspecified: Secondary | ICD-10-CM

## 2022-11-28 DIAGNOSIS — I779 Disorder of arteries and arterioles, unspecified: Secondary | ICD-10-CM

## 2022-11-28 DIAGNOSIS — E782 Mixed hyperlipidemia: Secondary | ICD-10-CM

## 2022-11-28 DIAGNOSIS — R0609 Other forms of dyspnea: Secondary | ICD-10-CM

## 2022-11-28 DIAGNOSIS — Z8673 Personal history of transient ischemic attack (TIA), and cerebral infarction without residual deficits: Secondary | ICD-10-CM

## 2022-11-28 MED ORDER — SPIRONOLACTONE 25 MG PO TABS
12.5000 mg | ORAL_TABLET | Freq: Every day | ORAL | 3 refills | Status: DC
Start: 2022-11-28 — End: 2023-05-30

## 2022-11-28 NOTE — Telephone Encounter (Signed)
Follow up call to pt. She prefers [place].

## 2022-11-28 NOTE — Telephone Encounter (Signed)
Added pt to PA tracker for Leqvio to be handled.

## 2022-11-28 NOTE — Progress Notes (Signed)
[facility] PROGRESS NOTE    [facility] [place] [facility] -CARDIOLOGY  [address] [place] [address]  Dept: [phone]  Dept Fax: [phone]       Patient Name: Emily Rowe, Emily Rowe    Date of Visit:  November 28, 2022  Date of Birth: 1951/05/06  AGE: 72 y.o.  Medical Record #: [id number]  Requesting Physician: Jamey Ripa, MD      CHIEF COMPLAINT: Follow-up and History of hemorrhagic stroke with residual hemiparesis        HISTORY OF PRESENT ILLNESS:    She is a pleasant 72 y.o. female who presents today for regular follow up.    Unfortunately Emily Rowe has been feeling unwell.  She cannot exercise due to pain.  She says she has a pain that radiates from her feet up to her head.  She has frequent feeling of "pressure "in her head.  Episodes of feeling like her whole body is "on fire".  She often has to wrap her entire body including wet towels.  She does not have much of an appetite and is losing weight.  She says she feels very weak overall.  She has developed several food allergies and can no longer eat greens.  She also has frequent dizziness.  Her blood pressure has been fluctuating from the 130s to the 180s.  Her blood sugars have gone up and her hemoglobin A1c is 9.1.  Her lipids are also uncontrolled in the 170s for the LDL.    No frank angina.  Dyspnea is intermittent.  No exercise as noted above      PAST MEDICAL HISTORY: She has a past medical history of Anxiety, Asthma, Bowel perforation, Depression, Diabetes, Difficulty walking, Dysphagia, Gastroesophageal reflux disease, Hyperlipemia, Hypertensive disorder, Hypokalemia, Neuropathy, Post-operative nausea and vomiting, Sleep apnea (2017), Stroke (2012), TIA (transient ischemic attack), and Type 2 diabetes mellitus, controlled (2021). She has a past surgical history that includes Tonsillectomy; Reduction mammaplasty; urinary bladder; Colonoscopy; Colonoscopy (N/A, 09/17/2020);  EGD (N/A, 10/28/2020); Hysterectomy; Oophorectomy (Bilateral); and Breast biopsy (Left).    ALLERGIES:   Allergies   Allergen Reactions    Codeine Nausea And Vomiting    Crestor [Rosuvastatin] Rash     D/c due to severe rash and higher blood sugars    Glucosamine Hives and Nausea And Vomiting    Latex Rash    Tree Nuts Hives     ESPECIALLY ALMONDS     Dairy [Milk-Related Compounds]     Eggs Or Egg-Derived Products      Patient had an allergy test and stated that she was allergic to eggs    Gabapentin      Dizziness and rash, irregular heart rate    Gluten Meal     Invokana [Canagliflozin]     Levemir [Insulin Detemir] Nausea And Vomiting    Lyrica [Pregabalin]      PT STATES IT MAKES HER NEUROPATHY WORSE STATES SHE FEELS LIKE SHE IS ON FIRE    Metformin      diarrhea    Sucralfate Nausea And Vomiting     BODY ACHES, FEVER    Tyler Aas [Insulin Degludec]     Victoza [Liraglutide] Nausea And Vomiting    Flaxseed [Flaxseed (Linseed)] Rash    Omega 3 [Fish Oil] Rash    Peanut-Containing Drug Products Rash       MEDICATIONS:   Patient's current medications were reviewed. ONLY Cardiac medications were updated unless others were  addressed in assessment and plan.    Current Outpatient Medications:     aspirin 81 MG chewable tablet, Chew 1 tablet (81 mg) by mouth twice a week, Disp: , Rfl:     empagliflozin (Jardiance) 10 MG tablet, Take 1 tablet (10 mg) by mouth daily, Disp: 90 tablet, Rfl: 3    ergocalciferol (ERGOCALCIFEROL) 1.25 MG (50000 UT) capsule, Take 1 capsule (50,000 Units) by mouth once a week, Disp: 12 capsule, Rfl: 1    glipiZIDE (GLUCOTROL) 10 MG 24 hr tablet, TAKE 1 TABLET BY MOUTH TWICE A DAY, Disp: 180 tablet, Rfl: 0    glucose blood test strip, Check sugar three times a day, ICD 10 E11.9, Disp: 100 each, Rfl: 10    hydrocortisone 2.5 % cream, as needed, Disp: , Rfl:     insulin glargine (LANTUS SOLOSTAR) 100 UNIT/ML injection pen, Inject 10 units into skin daily at bedtime ONLY AS NEEDED IF BLOOD SUGAR >OR =  TO 170 LAST TAKEN 07/2020, Disp: 15 mL, Rfl: 1    LORazepam (ATIVAN) 0.5 MG tablet, Take 0.5 tablets (0.25 mg) by mouth nightly as needed for Anxiety, Disp: 20 tablet, Rfl: 0    losartan (COZAAR) 50 MG tablet, TAKE 1 TABLET(50 MG) BY MOUTH TWICE DAILY, Disp: 60 tablet, Rfl: 3    omeprazole (PriLOSEC) 20 MG capsule, 1 capsule (20 mg) as needed, Disp: , Rfl:     rosuvastatin (CRESTOR) 40 MG tablet, Take 1 tablet (40 mg) by mouth daily, Disp: 90 tablet, Rfl: 1    verapamil ER (CALAN-SR) 120 MG CR tablet, TAKE 1 TABLET(120 MG) BY MOUTH DAILY, Disp: 90 tablet, Rfl: 0    spironolactone (ALDACTONE) 25 MG tablet, Take 0.5 tablets (12.5 mg) by mouth daily, Disp: 45 tablet, Rfl: 3     FAMILY HISTORY: family history includes Breast cancer (age of onset: 85) in her daughter; Diabetes in her father and maternal grandmother; Heart attack in her mother; Stroke in her maternal grandfather and maternal grandmother.    SOCIAL HISTORY: She reports that she has never smoked. She has never been exposed to tobacco smoke. She has never used smokeless tobacco. She reports that she does not drink alcohol and does not use drugs.    PHYSICAL EXAMINATION    Visit Vitals  BP 152/84 (BP Site: Left arm, Patient Position: Sitting, Cuff Size: Large)   Pulse 78   Ht 1.651 m (5' 5"$ )   Wt 90.7 kg (200 lb)   SpO2 98%   BMI 33.28 kg/m       General Appearance:  Cooperative, alert and oriented, in no acute distress.    Skin: Warm and dry to touch, no apparent skin lesions.  Head: Normocephalic, normal hair pattern.   Eyes: conjunctivae and lids unremarkable.  ENT: Wearing facemask. No pallor or cyanosis.  Neck: JVP normal, no carotid bruit.  Chest: Clear to auscultation bilaterally with good air movement and respiratory effort and no wheezes, rales, or rhonchi   Cardiovascular: Regular rhythm, S1 normal, S2 normal, No S3 or S4, Apical impulse not displaced, no murmurs, gallops or rubs detected   Abdomen: Soft, nontender, and nondistended.   Extremities:  Warm.  No edema bilaterally. Radial pulses 2+ bilaterally.  Neuro: Alert and oriented x3. No gross motor or sensory deficits noted, affect appropriate. Normal memory.    BP Readings from Last 3 Encounters:   11/28/22 152/84   11/17/22 (!) 144/91   10/26/22 (!) 163/102     Wt Readings from Last 3 Encounters:  11/28/22 90.7 kg (200 lb)   11/17/22 92 kg (202 lb 13.2 oz)   10/26/22 92 kg (202 lb 12.8 oz)     LABS:   Lab Results   Component Value Date    WBC 6.25 11/17/2022    HGB 13.6 11/17/2022    HCT 43.2 11/17/2022    PLT 151 11/17/2022     Lab Results   Component Value Date    GLU 166 (H) 11/17/2022    BUN 14.0 11/17/2022    CREAT 0.8 11/17/2022    NA 141 11/17/2022    K 4.1 11/17/2022    CL 106 11/17/2022    CO2 26 11/17/2022    AST 19 11/17/2022    ALT 16 11/17/2022     Lab Results   Component Value Date    TSH 2.10 11/17/2022    HGBA1C 9.1 (H) 11/17/2022     Lab Results   Component Value Date    CHOL 249 (H) 11/17/2022    TRIG 142 11/17/2022    HDL 45 11/17/2022    LDL 176 (H) 11/17/2022           IMPRESSION:     Hypertension.  Poorly controlled  Intolerance to metoprolol, lisinopril, carvedilol, chlorthalidone, nifedipine  Benign palpitations via monitor 2018  Hyperlipidemia  LDL uncontrolled  Rosuvastatin 40  Did not tolerate Repatha  Obesity  Insulin-dependent diabetes, uncontrolled with hemoglobin A1c 9.1  Hemorrhagic stroke 2012  Patient report history of sleep study with no recommendation for CPAP  History of panic attacks and anxiety, fibromyalgia  Bilateral carotid disease less than 50% by Doppler 2022     RECOMMENDATIONS:     Blood pressure is not controlled.  Patient has multiple intolerances.  Trialing spironolactone.  Repeat BMP in 2 weeks.  Follow-up with APP in 2 to 4 weeks.  Consider renal denervation in the future  Blood sugars are not controlled.  Follow-up with endocrinology for titration of pharmacotherapy  Exercise inhibited by globalized pain  Heart healthy diet difficult due to food  intolerances to greens  Echocardiogram to reassess cardiac function given dyspnea  Lipids uncontrolled.  We will try inclisiran                                                 Orders Placed This Encounter   Procedures    Basic Metabolic Panel    Echo 2D Complete w/ Contrast    APP Office Visit ([facility] [place])     Orders Placed This Encounter   Medications    spironolactone (ALDACTONE) 25 MG tablet     Sig: Take 0.5 tablets (12.5 mg) by mouth daily     Dispense:  45 tablet     Refill:  3         SIGNED:    Braxton Feathers, MD          This note was generated by the Dragon speech recognition and may contain errors or omissions not intended by the user. Grammatical errors, random word insertions, deletions, pronoun errors, and incomplete sentences are occasional consequences of this technology due to software limitations. Not all errors are caught or corrected. If there are questions or concerns about the content of this note or information contained within the body of this dictation, they should be addressed directly with the author for clarification.

## 2022-11-29 ENCOUNTER — Other Ambulatory Visit: Payer: Self-pay | Admitting: Family Medicine

## 2022-11-29 ENCOUNTER — Encounter (INDEPENDENT_AMBULATORY_CARE_PROVIDER_SITE_OTHER): Payer: Self-pay | Admitting: Family Medicine

## 2022-11-29 ENCOUNTER — Ambulatory Visit (INDEPENDENT_AMBULATORY_CARE_PROVIDER_SITE_OTHER): Payer: Medicare Other | Admitting: Family Medicine

## 2022-11-29 ENCOUNTER — Ambulatory Visit (INDEPENDENT_AMBULATORY_CARE_PROVIDER_SITE_OTHER): Payer: Medicare Other

## 2022-11-29 VITALS — BP 164/93 | HR 77 | Resp 16 | Ht 65.0 in | Wt 200.0 lb

## 2022-11-29 DIAGNOSIS — G8929 Other chronic pain: Secondary | ICD-10-CM

## 2022-11-29 DIAGNOSIS — M2550 Pain in unspecified joint: Secondary | ICD-10-CM

## 2022-11-29 DIAGNOSIS — M25562 Pain in left knee: Secondary | ICD-10-CM

## 2022-11-29 DIAGNOSIS — M25512 Pain in left shoulder: Secondary | ICD-10-CM

## 2022-11-29 DIAGNOSIS — M25561 Pain in right knee: Secondary | ICD-10-CM

## 2022-11-29 NOTE — Progress Notes (Signed)
Orthopaedic New Patient Visit    Chief Complaint   Patient presents with    Knee Pain     New patient here today for evaluation of bilateral knee pain, right greater than left       HPI: Emily Rowe is a 72 y.o. female comes in for evaluation of bilateral knee pain right greater than left denies any recent injury accidents or falls but states that her pain is too great.  She did receive a steroid injection a few months ago with as well now here for more treatment options.  States that it is difficult for her to walk again without pain or discomfort she has tried some different medicines but she is sensitive stomach and allergy to a lot of meds and so for she is unable to take them.  Here for evaluation treatment options    ROS: Denies fevers, chills, chest pain, shortness of breath, nausea, vomiting, or diarrhea.    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    Essential hypertension 05/11/2019    Diabetic peripheral neuropathy 07/12/2018    History of hemorrhagic stroke with residual hemiparesis 07/12/2018    Neuropathy 01/17/2018    Obesity (BMI 30-39.9) 02/08/2017    Uncontrolled type 2 diabetes mellitus with hyperglycemia 04/20/2016    Mixed hyperlipidemia 04/20/2016    Pure hypercholesterolemia, unspecified 11/20/2013    Stroke 10/17/2010       Current Outpatient Medications   Medication Sig Dispense Refill    aspirin 81 MG chewable tablet Chew 1 tablet (81 mg) by mouth twice a week      empagliflozin (Jardiance) 10 MG tablet Take 1 tablet (10 mg) by mouth daily 90 tablet 3    ergocalciferol (ERGOCALCIFEROL) 1.25 MG (50000 UT)  capsule Take 1 capsule (50,000 Units) by mouth once a week 12 capsule 1    glipiZIDE (GLUCOTROL) 10 MG 24 hr tablet TAKE 1 TABLET BY MOUTH TWICE A DAY 180 tablet 0    glucose blood test strip Check sugar three times a day, ICD 10 E11.9 100 each 10    hydrocortisone 2.5 % cream as needed      insulin glargine (LANTUS SOLOSTAR) 100 UNIT/ML injection pen Inject 10 units into skin daily at bedtime ONLY AS NEEDED IF BLOOD SUGAR >OR = TO 170  LAST TAKEN 07/2020 15 mL 1    LORazepam (ATIVAN) 0.5 MG tablet Take 0.5 tablets (0.25 mg) by mouth nightly as needed for Anxiety 20 tablet 0    losartan (COZAAR) 50 MG tablet TAKE 1 TABLET(50 MG) BY MOUTH TWICE DAILY 60 tablet 3    omeprazole (PriLOSEC) 20 MG capsule 1 capsule (20 mg) as needed      rosuvastatin (CRESTOR) 40 MG tablet Take 1 tablet (40 mg) by mouth daily 90 tablet 1    spironolactone (ALDACTONE) 25 MG tablet Take 0.5 tablets (12.5 mg) by mouth daily 45 tablet 3    verapamil ER (CALAN-SR) 120 MG CR tablet TAKE 1 TABLET(120 MG) BY MOUTH DAILY 90 tablet 0     No current facility-administered medications for this visit.     Marland Kitchen  Allergies: Codeine, Crestor [rosuvastatin], Glucosamine, Latex, Tree nuts, Dairy [milk-related compounds], Eggs or egg-derived products, Gabapentin, Gluten meal, Invokana [canagliflozin], Levemir [insulin detemir], Lyrica [pregabalin], Metformin, Sucralfate, Tresiba [insulin degludec], Victoza [liraglutide], Flaxseed [flaxseed (linseed)], Omega 3 [fish oil], and Peanut-containing drug products  Past Medical History:   Diagnosis Date    Anxiety     Asthma     allergic reaction to mold, not since 20 years, HAS WHEEZING IN COLD WEATHER. NO INHALERS    Bowel perforation     COLONOSCOPY 09/2019    Depression     Diabetes     Difficulty walking     WEAK RIGHT LEG AFTER CVA 2021    Dysphagia     BIG PILLS ONLY     Gastroesophageal reflux disease     PREVACID OTC PRN     Hyperlipemia     Hypertensive disorder     Hypokalemia     CMP REQ PCP 09/14/20     Neuropathy     NUMBNESS RIGHT UE AND LE     Post-operative nausea and vomiting     ALWAYS WITH ANESTHESIA     Sleep apnea 2017    NO CPAP NEEDED PER PT     Stroke 2012     ARM, LEG WEAK. WALKS WITH CANE, WALKER    TIA (transient ischemic attack)     MULTIPLE BEFORE CVA 2012    Type 2 diabetes mellitus, controlled 2021    MANAGED BY DR Vertell Limber RAO. AM FSB 117-140. TAKES LANTUS PRN IN AM. HAIC 7.9     Past Surgical History:   Procedure Laterality Date    BREAST BIOPSY Left     benign lump biopsy    COLONOSCOPY      X 2    COLONOSCOPY N/A 09/17/2020    Procedure: COLONOSCOPY;  Surgeon: Albertina Senegal, MD;  Location: [facility];  Service: Gastroenterology;  Laterality: N/A;    EGD N/A 10/28/2020    Procedure: EGD w/ bx's;  Surgeon: Memory Argue, MD;  Location: [facility];  Service: Gastroenterology;  Laterality: N/A;    HYSTERECTOMY      2005    OOPHORECTOMY Bilateral     2005    REDUCTION MAMMAPLASTY      TONSILLECTOMY      urinary bladder      BLADDER LIFT      Family History   Problem Relation Age of Onset    Heart attack Mother     Diabetes Father     Diabetes Maternal Grandmother     Stroke Maternal Grandmother     Stroke Maternal Grandfather     Breast cancer Daughter 9    Colon cancer Neg Hx      Social History     Tobacco Use    Smoking status: Never     Passive exposure: Never    Smokeless tobacco: Never   Substance Use Topics    Alcohol use: Never       In addition to findings reviewed in EPIC, relevant Medical/Family/Social History noted in today's HPI, otherwise are noncontributory to today's visit.    Physical Exam: Pt is a well developed, well nourished 72 y.o. year old female who is awake, alert, and oriented.  The patient has a normal affect and is in no acute distress.  There were no vitals filed for this visit.    Gait: normal  Constitutional: Pt is well-developed, well-nourished, and in no distress.   HENT:  Head: Normocephalic and atraumatic.   Eyes: Conjunctivae are normal.    Neck: Neck supple.   Cardiovascular: Normal rate  Pulmonary/Chest: Effort normal.   Neurological: Pt is alert and oriented to person, place, and time.   Skin: Skin is warm and dry. No rash noted. Pt is not diaphoretic.   Psychiatric: Affect normal.     Left, Right Knee  Observation:  No swelling, deformity or ecchymosis; antalgic gait; No genu varus or valgus deformity or excessive recurvatum   Effusion:  none  Range of Motion:  0 to 90 Deg; Yes pain at extremes of ROM  Joint Line Tenderness: medial and lateral  Tenderness:  Yes tenderness over tibial tubercle, patellar tendon, prepatellar bursa, infrapatellar bursa, pes anserine bursa, medial retinacular area, lateral retinacular area, at Gerdy's tubercle, lateral femoral epicondyle, quadriceps tendon  Strength: normal in all planes  Patella:  Yes crepitus with active ROM;  Grind: negative; tender at proximal pole, inferior pole, medial facet, lateral facet  Patellar Glide:  normal  Plica:  None  Patellar Apprehension:  negative  Lachman: negative  Anterior Drawer:  negative  Valgus Stress for MCL:  negative   Varus Stress for LCL:  negative  Posterior Drawer:  negative  Sag sign:  negative  Dial Test:  30 Deg - normal; 90 Deg - N/A  McMurray:  negative medial  Thessaly Test:  negative  Distal neurovascular intact    For comparison, their contralateral extremity is normal on exam with no pain nor crepitus.    Radiology:     Xrays taken in clinic today: X-rays were obtained of theright knee.  They reveal no arthritic changes of the medial compartment.  They reveal little arthritic changes of the lateral compartment.  They reveal little arthritic changes of the patellofemoral joint.  There is not loose bodies noted.  There is not osteochondral lesions noted.  There is not fracture(s) noted. The patellofemoral joint are reduced.  The femoral- tibial joint is reduced.       Impression:  Encounter Diagnoses   Name Primary?    Arthralgia, unspecified joint     Chronic  left shoulder pain     Chronic pain of both knees Yes       Plan:     The patient's diagnosis and treatment options were discussed in detail at today's visit. I have taken this opportunity to refer the patient to formal physical therapy. I have asked the patient to avoid activities that exacerbate or worsen his symptoms.  We reviewed the standard conservative management of the diagnosis as well as the use of prescription medications for pain and inflammation as well as Tylenol and OTC NSAIDS.  We discussed the role of injection therapy in management and its potential role in diagnosis and short term pain relief.  All questions were answered to the patient's satisfaction.  The patient will notify my clinic of any changes or worsening of their symptoms during the interim.

## 2022-12-07 ENCOUNTER — Encounter (INDEPENDENT_AMBULATORY_CARE_PROVIDER_SITE_OTHER): Payer: Self-pay | Admitting: Registered"

## 2022-12-07 ENCOUNTER — Telehealth (INDEPENDENT_AMBULATORY_CARE_PROVIDER_SITE_OTHER): Payer: Medicare Other | Admitting: Registered"

## 2022-12-07 VITALS — Ht 65.0 in | Wt 195.0 lb

## 2022-12-07 DIAGNOSIS — E782 Mixed hyperlipidemia: Secondary | ICD-10-CM

## 2022-12-07 DIAGNOSIS — R5382 Chronic fatigue, unspecified: Secondary | ICD-10-CM

## 2022-12-07 DIAGNOSIS — E1142 Type 2 diabetes mellitus with diabetic polyneuropathy: Secondary | ICD-10-CM

## 2022-12-07 DIAGNOSIS — E1165 Type 2 diabetes mellitus with hyperglycemia: Secondary | ICD-10-CM

## 2022-12-07 NOTE — Progress Notes (Signed)
[facility]  Telemedicine Visit  via HIPAA compliant secure video link   Date 12/07/22 (Start time 1307- End [id number])    Originating site (Patient location): Home   Distant site (Provider location): [facility]  Provider and Title: Phillis Knack, RD, CDCES  Consent obtained: Yes (verbal)   Language: [facility]  MEDICAL NUTRITION THERAPY ASSESSMENT:    Referral: Emily Rowe is 72 y.o.female was referred by Dr, Ernest Mallick for Medical Nutrition Therapy for Type 2 diabetes.     Client history: Patient's medical history includes type 2 diabetes, h/o stroke, food intolerance (suspect IBS), HTN, HLD. Current wt 195lbs, reports UBW 208lbs. Pt reports intolerances to soy, vegetables, nuts, dairy, and chicken/fish. Foods cause stomach ache, gas and bloating. She reports medications also cause stomach aches. It is hard for her to be compliant with medication and diabetes diet as a result. Reports normal colonscopy. Suggest pt get tested for H Pylori and SIBO. Eliminate onion and garlic. She would benefit from seeing GI and allergist.   The patient's main concern/goal today is what she can eat and lower her A1c.    Pt was resistant to a lot of suggestions and education. With every food suggestion, she reported blood sugar increase. Tried to provide education on how blood sugars do increase, priority is balancing plate so they will decrease. She has great stress and emotion in her life that she reports are also causing poor blood sugar control. Recommend pt see higher care including therapist/counselor.     Pertinent Meds:   Lantus if BS above 170 mg/dl at night - 10 units  Jardiance 10 mg  Glipizide 10 mg   Crestor    Pertinent Labs:   A1c: 9.1% (11/17/22), 9.0% (08/09/22)    SMBG Readings Past Two Weeks (logbook/patient report):   FBG:  99-121 mg/dl           Post lunch: 140-150 mg/dl    Anthropometrics:  Estimated body mass index is 32.45 kg/m as calculated from the following:     Height as of this encounter: 1.651 m (5' 5"$ ).    Weight as of this encounter: 88.5 kg (195 lb).    Food allergies: intolerant to soy, nuts, dairy, meat, fish, and most non starchy vegetables      Intervention:                           Education provided:   Balancing plate to help lower BS  Recommended GI tests  Meal and snack ideas- met with resistance    Goals:   Lower A1c 7.5%  Get referral to GI doc, get tested for H Pylori and SIBO  Eat the non starchy vegetables you can tolerate with meals  Eliminate garlic and onion    Understanding and Motivation to follow recommendations: poor    Monitoring/Evaluation:     Follow up: pt recommended to see GI, allergist, and therapist

## 2022-12-08 ENCOUNTER — Ambulatory Visit: Payer: Medicare Other

## 2022-12-08 NOTE — Progress Notes (Deleted)
History of present illness:    72 y/o female, Comes into the office today for evaluation of ***    Referred by     Pain level **/10    For the pain,     Has seen Neurology-                 Medications: No new medications discussed.      Current Outpatient Medications:     aspirin 81 MG chewable tablet, Chew 1 tablet (81 mg) by mouth twice a week (Patient not taking: Reported on 12/07/2022), Disp: , Rfl:     empagliflozin (Jardiance) 10 MG tablet, Take 1 tablet (10 mg) by mouth daily, Disp: 90 tablet, Rfl: 3    ergocalciferol (ERGOCALCIFEROL) 1.25 MG (50000 UT) capsule, Take 1 capsule (50,000 Units) by mouth once a week, Disp: 12 capsule, Rfl: 1    glipiZIDE (GLUCOTROL) 10 MG 24 hr tablet, TAKE 1 TABLET BY MOUTH TWICE A DAY, Disp: 180 tablet, Rfl: 0    glucose blood test strip, Check sugar three times a day, ICD 10 E11.9, Disp: 100 each, Rfl: 10    hydrocortisone 2.5 % cream, as needed (Patient not taking: Reported on 12/07/2022), Disp: , Rfl:     insulin glargine (LANTUS SOLOSTAR) 100 UNIT/ML injection pen, Inject 10 units into skin daily at bedtime ONLY AS NEEDED IF BLOOD SUGAR >OR = TO 170 LAST TAKEN 07/2020, Disp: 15 mL, Rfl: 1    LORazepam (ATIVAN) 0.5 MG tablet, Take 0.5 tablets (0.25 mg) by mouth nightly as needed for Anxiety (Patient not taking: Reported on 12/07/2022), Disp: 20 tablet, Rfl: 0    losartan (COZAAR) 50 MG tablet, TAKE 1 TABLET(50 MG) BY MOUTH TWICE DAILY, Disp: 60 tablet, Rfl: 3    omeprazole (PriLOSEC) 20 MG capsule, 1 capsule (20 mg) as needed (Patient not taking: Reported on 12/07/2022), Disp: , Rfl:     rosuvastatin (CRESTOR) 40 MG tablet, Take 1 tablet (40 mg) by mouth daily, Disp: 90 tablet, Rfl: 1    spironolactone (ALDACTONE) 25 MG tablet, Take 0.5 tablets (12.5 mg) by mouth daily (Patient not taking: Reported on 12/07/2022), Disp: 45 tablet, Rfl: 3    verapamil ER (CALAN-SR) 120 MG CR tablet, TAKE 1 TABLET(120 MG) BY MOUTH DAILY, Disp: 90 tablet, Rfl: 0     Allergies   Allergen Reactions     Codeine Nausea And Vomiting    Crestor [Rosuvastatin] Rash     D/c due to severe rash and higher blood sugars    Glucosamine Hives and Nausea And Vomiting    Latex Rash    Tree Nuts Hives     ESPECIALLY ALMONDS     Dairy [Milk-Related Compounds]     Eggs Or Egg-Derived Products      Patient had an allergy test and stated that she was allergic to eggs    Gabapentin      Dizziness and rash, irregular heart rate    Gluten Meal     Invokana [Canagliflozin]     Levemir [Insulin Detemir] Nausea And Vomiting    Lyrica [Pregabalin]      PT STATES IT MAKES HER NEUROPATHY WORSE STATES SHE FEELS LIKE SHE IS ON FIRE    Metformin      diarrhea    Sucralfate Nausea And Vomiting     BODY ACHES, FEVER    Tyler Aas [Insulin Degludec]     Victoza [Liraglutide] Nausea And Vomiting    Flaxseed [Flaxseed (Linseed)] Rash    Omega  3 [Fish Oil] Rash    Peanut-Containing Drug Products Rash        Past Medical History:   Diagnosis Date    Anxiety     Asthma     allergic reaction to mold, not since 20 years, HAS WHEEZING IN COLD WEATHER. NO INHALERS    Bowel perforation     COLONOSCOPY 09/2019    Depression     Diabetes     Difficulty walking     WEAK RIGHT LEG AFTER CVA 2021    Dysphagia     BIG PILLS ONLY     Gastroesophageal reflux disease     PREVACID OTC PRN     Hyperlipemia     Hypertensive disorder     Hypokalemia     CMP REQ PCP 09/14/20    Neuropathy     NUMBNESS RIGHT UE AND LE     Post-operative nausea and vomiting     ALWAYS WITH ANESTHESIA     Sleep apnea 2017    NO CPAP NEEDED PER PT     Stroke 2012     ARM, LEG WEAK. WALKS WITH CANE, WALKER    TIA (transient ischemic attack)     MULTIPLE BEFORE CVA 2012    Type 2 diabetes mellitus, controlled 2021    MANAGED BY DR Vertell Limber RAO. AM FSB 117-140. TAKES LANTUS PRN IN AM. HAIC 7.9        Past Surgical History:   Procedure Laterality Date    BREAST BIOPSY Left     benign lump biopsy    COLONOSCOPY      X 2    COLONOSCOPY N/A 09/17/2020    Procedure: COLONOSCOPY;  Surgeon: Albertina Senegal,  MD;  Location: [facility];  Service: Gastroenterology;  Laterality: N/A;    EGD N/A 10/28/2020    Procedure: EGD w/ bx's;  Surgeon: Memory Argue, MD;  Location: [facility];  Service: Gastroenterology;  Laterality: N/A;    HYSTERECTOMY      2005    OOPHORECTOMY Bilateral     2005    REDUCTION MAMMAPLASTY      TONSILLECTOMY      urinary bladder      BLADDER LIFT         12 Point Review of Systems:  noncontributory, except for what's listed in history.  ROS      Social History : No changes from listed in chart.     Tobacco Use: Low Risk  (12/07/2022)    Patient History     Smoking Tobacco Use: Never     Smokeless Tobacco Use: Never     Passive Exposure: Never            PHYSICAL EXAM:  Constitutional: Pt Looks comfortable, breathing well, in no distress.    Weight Status - Normal for age    Gait: Assistive devices - None    Tandem Gait/balance: Normal    Psychiatric:  PT appears to be alert and orientated with stable psychiatric evaluation  Waddell Signs: Negative    Musculoskeletal:      Cervical Spine:     Range of motion:  Flexion 1 to 2 fingerbreadths  Extension neutral to 70 degrees  Rotation 80 degrees/80 degrees  Spurling sign: Negative    Lumbar:   Range of Motion:  Flexion 0 to 6 inches above floor  Extension 10 to 20 degrees    Tenderness:  Midline Cervical Spine:  None  Midline Thoracic Spine:  None  Midline Lumbar Spine: None  Iliolumbar Region: None  SI joint Region: None  Greater Trochanteric Region: None    Straight Leg Raising: Negative  Crossed SLR:  Negative  Femoral Nerve Stretch Test:  Negative  FABER test: Negative bilaterally  Hip range of motion: Without pain, full bilaterally    Neurologic:    Motor function upper and lower extremities:    Right:  Iliopsoas 5/5  Quadriceps 5/5  Anterior tibialis 5/5  EHL 5/5  Gastroc 5/5    Deltoid 5/5     Biceps 5/5  Triceps 5/5  Wrist ext 5/5  Wrist Flex 5/5  Grip 5/5  Interossei 5/5    Left:  Iliopsoas 5/5  Quadriceps 5/5  Anterior  tibialis 5/5  EHL 5/5  Gastroc 5/5    Deltoid 5/5     Biceps 5/5  Triceps 5/5  Wrist ext 5/5  Wrist Flex 5/5     Grip 5/5  Interossei 5/5      Deep Tendon Reflexes:  2/2 Left knee, 2/2 Right Knee, 2/2 Left ankle, 2/2 Right ankle, 2/2 Left bicep, 2/2 Right bicep, and 2/2 Left tricep, 2/2 Right tricep.      Sensory Examination: Normal to light touch    Hoffmann:  Negative  Babinski:  Negative  Clonus: Negative      Tinels Wrist: Negative bilaterally            Ulnar: Negative bilaterally    Skin:  Without rashes/ lesions    Vasculature:  Capillary refill intact UE's, LE's  Radial pulses intact.    Hematologic/Lymph:  No evidence of Pitting Edema in LE's, No UE lymphedema, no signs of DVT or Homans sign, no calf pain or swelling.    Gastrointestinal:  Abdomen:  Soft/Non tender, No pulsatile masses.            Imaging Studies:                    Assessment/Plan:                            There were no vitals filed for this visit.          Oneida Alar, M.D. F.A.A.O.S.  Director - [facility]  [email or web address]        *The review of the patient's medications does not in any way constitute an endorsement, by this clinician, of their use, dosage, indications, route, efficacy, interactions, or other clinical parameters.    It is customary for my notes to be completed with the assistance of a scribe for data entry, usually my physician assistant Judge Stall, Pa-C. However, all decisions and treatment options were rendered by myself after personal discussion of the relative historical, exam and radiological findings with the patient.     This note was generated within the EPIC EMR using Dragon medical speech recognition software and may contain inherent errors or omissions not intended by the user. Grammatical and punctuation errors, random word insertions, deletions, pronoun errors and incomplete sentences are occasional consequences  of this technology due to software limitations. Not all errors are caught or corrected.  Although every attempt is made to root out erroneous and incomplete transcription, the note may still not fully represent the intent or opinion of the author. If there are questions or concerns about the content of this note or information contained within  the body of this dictation, they should be addressed directly with the author for clarification*

## 2022-12-12 ENCOUNTER — Ambulatory Visit (HOSPITAL_BASED_OUTPATIENT_CLINIC_OR_DEPARTMENT_OTHER): Payer: Medicare Other | Admitting: Orthopaedic Surgery

## 2022-12-13 ENCOUNTER — Ambulatory Visit: Payer: Medicare Other

## 2022-12-15 ENCOUNTER — Encounter (INDEPENDENT_AMBULATORY_CARE_PROVIDER_SITE_OTHER): Payer: Self-pay

## 2022-12-15 NOTE — Telephone Encounter (Signed)
Derrell Lolling referral packet to [facility].

## 2022-12-16 ENCOUNTER — Ambulatory Visit (INDEPENDENT_AMBULATORY_CARE_PROVIDER_SITE_OTHER): Payer: Medicare Other | Admitting: Family Medicine

## 2022-12-16 ENCOUNTER — Encounter (INDEPENDENT_AMBULATORY_CARE_PROVIDER_SITE_OTHER): Payer: Self-pay | Admitting: Family Medicine

## 2022-12-16 VITALS — BP 145/84 | HR 66 | Temp 98.2°F | Resp 16 | Wt 203.0 lb

## 2022-12-16 DIAGNOSIS — F419 Anxiety disorder, unspecified: Secondary | ICD-10-CM

## 2022-12-16 DIAGNOSIS — I10 Essential (primary) hypertension: Secondary | ICD-10-CM

## 2022-12-16 DIAGNOSIS — E1165 Type 2 diabetes mellitus with hyperglycemia: Secondary | ICD-10-CM

## 2022-12-16 DIAGNOSIS — Z8673 Personal history of transient ischemic attack (TIA), and cerebral infarction without residual deficits: Secondary | ICD-10-CM | POA: Insufficient documentation

## 2022-12-16 DIAGNOSIS — E7800 Pure hypercholesterolemia, unspecified: Secondary | ICD-10-CM

## 2022-12-16 NOTE — Progress Notes (Signed)
Have you seen any specialists/other providers since your last visit with [place]?    Yes-Dr Bess Kinds    Arm preference verified?   Yes    Health Maintenance Due   Topic Date Due    Advance Directive on File  Never done    Shingrix Vaccine 50+ (1) Never done    DXA Scan  09/23/2021    COVID-19 Vaccine (4 - 2023-24 season) 06/17/2022    Tetanus Ten-Year  09/04/2022         Date: 12/16/2022 5:27 PM   Patient ID: Emily Rowe is a 72 y.o. female.    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 And given cymbalta but not started    12/01/22 mammo Negative    Seen ortho - was told she could get synvisc    Seen opthal - + glaucoma and seeing another MD      DM- seeing Dr Janese Banks   Seen July 2023- A1c 8.9- added jardiance 10 mg- urinating lot, inclding glipizide, insulin 10 lantus- uses only if needed, tradenta    Never started PT as tender at all times to touch    Mood fine, not anxious as before  Seeing therapist- appt today  Stopped zoloft- " I do not feel I need anymore"    Hypertension-chronic  She feels her blood pressure has been stable at home  Denies having any headache, chest pain shortness of breath    Hyperlipidemia-seeing cardiology and getting Repatha    HPI       The other 14 point review systems are negative except those in HPI.    Current Medications:  Outpatient Medications Marked as Taking for the 12/16/22 encounter (Office Visit) with Jamey Ripa, MD   Medication Sig Dispense Refill    aspirin 81 MG chewable tablet Chew 1 tablet (81 mg) by mouth daily      Cymbalta 20 MG capsule Take 1 capsule (20 mg) by mouth daily      empagliflozin (Jardiance) 10 MG tablet Take 1 tablet (10  mg) by mouth daily 90 tablet 3    ergocalciferol (ERGOCALCIFEROL) 1.25 MG (50000 UT) capsule Take 1 capsule (50,000 Units) by mouth once a week 12 capsule 1    glipiZIDE (GLUCOTROL) 10 MG 24 hr tablet TAKE 1 TABLET BY MOUTH TWICE A DAY 180 tablet 0    glucose blood test strip Check sugar three times a day, ICD 10 E11.9 100 each 10    hydrocortisone 2.5 % cream as needed      insulin glargine (LANTUS SOLOSTAR) 100 UNIT/ML injection pen Inject 10 units into skin daily at bedtime ONLY AS NEEDED IF BLOOD SUGAR >OR = TO 170  LAST TAKEN 07/2020 15 mL 1    LORazepam (ATIVAN) 0.5 MG tablet Take 0.5 tablets (0.25 mg) by mouth nightly as needed for Anxiety 20 tablet 0    losartan (COZAAR) 50 MG tablet TAKE 1 TABLET(50 MG) BY MOUTH TWICE DAILY 60 tablet 3    omeprazole (PriLOSEC) 20 MG capsule 1 capsule (20 mg) as needed      spironolactone (ALDACTONE) 25 MG tablet Take 0.5  tablets (12.5 mg) by mouth daily 45 tablet 3    verapamil ER (CALAN-SR) 120 MG CR tablet TAKE 1 TABLET(120 MG) BY MOUTH DAILY 90 tablet 0    [DISCONTINUED] rosuvastatin (CRESTOR) 40 MG tablet Take 1 tablet (40 mg) by mouth daily 90 tablet 1       Past Medical History:  Past Medical History:   Diagnosis Date    Anxiety     Asthma     allergic reaction to mold, not since 20 years, HAS WHEEZING IN COLD WEATHER. NO INHALERS    Bowel perforation     COLONOSCOPY 09/2019    Depression     Diabetes     Difficulty walking     WEAK RIGHT LEG AFTER CVA 2021    Dysphagia     BIG PILLS ONLY     Gastroesophageal reflux disease     PREVACID OTC PRN     Hyperlipemia     Hypertensive disorder     Hypokalemia     CMP REQ PCP 09/14/20    Neuropathy     NUMBNESS RIGHT UE AND LE     Post-operative nausea and vomiting     ALWAYS WITH ANESTHESIA     Sleep apnea 2017    NO CPAP NEEDED PER PT     Stroke 2012     ARM, LEG WEAK. WALKS WITH CANE, WALKER    TIA (transient ischemic attack)     MULTIPLE BEFORE CVA 2012    Type 2 diabetes mellitus, controlled 2021    MANAGED BY DR Vertell Limber  RAO. AM FSB 117-140. TAKES LANTUS PRN IN AM. HAIC 7.9       Past Surgical History:  Past Surgical History:   Procedure Laterality Date    BREAST BIOPSY Left     benign lump biopsy    COLONOSCOPY      X 2    COLONOSCOPY N/A 09/17/2020    Procedure: COLONOSCOPY;  Surgeon: Albertina Senegal, MD;  Location: [facility];  Service: Gastroenterology;  Laterality: N/A;    EGD N/A 10/28/2020    Procedure: EGD w/ bx's;  Surgeon: Memory Argue, MD;  Location: [facility];  Service: Gastroenterology;  Laterality: N/A;    HYSTERECTOMY      2005    OOPHORECTOMY Bilateral     2005    REDUCTION MAMMAPLASTY      TONSILLECTOMY      urinary bladder      BLADDER LIFT        Family History:  Family History   Problem Relation Age of Onset    Heart attack Mother     Diabetes Father     Diabetes Maternal Grandmother     Stroke Maternal Grandmother     Stroke Maternal Grandfather     Breast cancer Daughter 64    Colon cancer Neg Hx        Social History:   reports that she has never smoked. She has never been exposed to tobacco smoke. She has never used smokeless tobacco. She reports that she does not drink alcohol and does not use drugs.    Allergies:  Allergies   Allergen Reactions    Codeine Nausea And Vomiting    Crestor [Rosuvastatin] Rash     D/c due to severe rash and higher blood sugars    Glucosamine Hives and Nausea And Vomiting    Latex Rash    Tree Nuts Hives     ESPECIALLY ALMONDS  Dairy [Milk-Related Compounds]     Eggs Or Egg-Derived Products      Patient had an allergy test and stated that she was allergic to eggs    Gabapentin      Dizziness and rash, irregular heart rate    Gluten Meal     Invokana [Canagliflozin]     Levemir [Insulin Detemir] Nausea And Vomiting    Lyrica [Pregabalin]      PT STATES IT MAKES HER NEUROPATHY WORSE STATES SHE FEELS LIKE SHE IS ON FIRE    Metformin      diarrhea    Repatha [Evolocumab] Headaches    Sucralfate Nausea And Vomiting     BODY ACHES, FEVER    Tyler Aas [Insulin  Degludec]     Victoza [Liraglutide] Nausea And Vomiting    Flaxseed [Flaxseed (Linseed)] Rash    Omega 3 [Fish Oil] Rash    Peanut-Containing Drug Products Rash        The following sections were reviewed this encounter by the provider:   Tobacco  Allergies  Meds  Problems  Med Hx  Surg Hx  Fam Hx         VITALS:  Vitals:    12/16/22 1440   BP: 145/84   BP Site: Left arm   Patient Position: Sitting   Cuff Size: Large   Pulse: 66   Resp: 16   Temp: 98.2 F (36.8 C)   TempSrc: Oral   SpO2: 96%   Weight: 92.1 kg (203 lb)      ROS:  General/Constitutional:   Denies Chills. Denies Fatigue. Denies Fever.   Ophthalmologic:   Denies Blurred vision. Denies Eye Pain.   ENT:   Denies Nasal Discharge. Denies Ear pain. Denies Sinus pain.   Endocrine:   Denies Polydipsia. Denies Polyuria.   Respiratory:   Denies Cough. Denies Orthopnea. Denies Shortness of breath. Denies Wheezing.   Cardiovascular:   Denies Chest pain. Denies Chest pain with exertion. Denies Leg Claudication. Denies      Palpitations. Denies Swelling in hands/feet.   Gastrointestinal:   Denies Abdominal pain. Denies Blood in stool. Denies Constipation. Denies Diarrhea.      Denies Heartburn. Denies Nausea. Denies Vomiting.   Genitourinary:   Denies Blood in urine. Denies Frequent urination. Denies Painful urination.   Musculoskeletal:   + Leg cramps. + Muscle aches.   Skin:   Denies Skin lesion(s).   Neurologic:   Denies Dizziness. +Gait abnormality. Denies Headache. +  Tingling/Numbness.      Physical Exam:  Alert and oriented. Affect is normal and appropriate. Clear thought process with normal reasoning and conversational tone and logic. No psychomotor retardation. Not in any acute distress  Skin: no rash or abnormal lesions  Eyes - normal eye movements.  Pupils equal and reactive to light  Eyelid normal on exam  Ears - TM clear, external ear normal  Nose - no acute changes  Throat - Oral mucosa moist and pink.  Neck- LEFT SIDE neck thyroid nodule,  nontender, no palpable cervical lymph nodes  HEART: S1S2 heard, regular rate and rhythm  LUNGS: good air exchange, no crackles or ronchi or wheezing  ABDOMEN: Positive bowel sounds, no hepatospleenomegaly appreciated, no tenderness   Back: Normal curvature, no tenderness.   Extremities: FROM, no deformities, no edema, no erythema.   No knee effusion.   mild tendernesson the joint line.  Neuro: Physiological, speech better than before.   Right side weakness.   Restricted and only able to abduct <  20%, Right leg weakness.   Strength RUE and RLE 3/5.        Psych-good eye contact  Train of thought normal  Judgment normal      Lab Results   Component Value Date    WBC 6.25 11/17/2022    HGB 13.6 11/17/2022    HCT 43.2 11/17/2022    PLT 151 11/17/2022    CHOL 249 (H) 11/17/2022    TRIG 142 11/17/2022    HDL 45 11/17/2022    LDL 176 (H) 11/17/2022    ALT 16 11/17/2022    AST 19 11/17/2022    NA 141 11/17/2022    K 4.1 11/17/2022    CL 106 11/17/2022    CREAT 0.8 11/17/2022    BUN 14.0 11/17/2022    CO2 26 11/17/2022    TSH 2.10 11/17/2022    INR 1.0 07/14/2021    GLU 166 (H) 11/17/2022    HGBA1C 9.1 (H) 11/17/2022         ASSESSMENT AND PLAN:  1. Essential hypertension  - Comprehensive metabolic panel; Future  - Lipid panel; Future  - Hemoglobin A1C; Future  - Urine Microalbumin Random; Future    2. Pure hypercholesterolemia, unspecified    3. History of CVA (cerebrovascular accident)    4. Anxiety    5. Uncontrolled type 2 diabetes mellitus with hyperglycemia  - Hemoglobin A1C; Future  - Urine Microalbumin Random; Future         Anxiety-at this time she has been started again on Cymbalta by her neuro  She has tried multiple medication including Cymbalta previously with me but never stayed on it  She says that she is going to get some Voltaren to try it    Significant body ache and joint pain  Rheumatological workup showing slightly elevated sed rate and also positive ANA  Has an appointment to see rheumatologist in 2  months    Uncontrolled hypertension  She states that the rosuvastatin was causing her to have pain and so she is now back to taking simvastatin 20 twice a day-states that she has old prescription  Needs check lipid panel      Chronic pain -still present    Panc cyst - being monitored  Shjould  set appt with GI- pt will call    HTN - chronic  Continuing verapamil  Pt to fu closely with cardio  Anxiety making it worse, BP high in office- just took med per pt  Monitor at [facility] and send mychart msg  Risk of high BP - pt aware including bleed, CVA again    DM- fu with endo  Labs today- check if adding jardiance has helped  Seeing retina specialist- last seen July      FOLLOW-UP:  Return in about 3 months (around 03/18/2023) for Diabetes.      This note was generated by the Epic EMR system/ Dragon speech recognition and may contain inherent errors or omissions not intended by the user. Grammatical errors, random word insertions, deletions, pronoun errors and incomplete sentences are occasional consequences of this technology due to software limitations. Not all errors are caught or corrected. If there are questions or concerns about the content of this note or information contained within the body of this dictation they should be addressed directly with the author for clarification      Dalexa Gentz Sidonie Dickens, MD

## 2022-12-22 ENCOUNTER — Ambulatory Visit: Payer: Medicare Other

## 2022-12-23 ENCOUNTER — Encounter (INDEPENDENT_AMBULATORY_CARE_PROVIDER_SITE_OTHER): Payer: Self-pay

## 2022-12-24 ENCOUNTER — Other Ambulatory Visit: Payer: Self-pay | Admitting: Family Medicine

## 2022-12-26 ENCOUNTER — Encounter (INDEPENDENT_AMBULATORY_CARE_PROVIDER_SITE_OTHER): Payer: Self-pay | Admitting: Family Medicine

## 2022-12-27 ENCOUNTER — Ambulatory Visit: Payer: Medicare Other

## 2022-12-28 ENCOUNTER — Encounter (INDEPENDENT_AMBULATORY_CARE_PROVIDER_SITE_OTHER): Payer: Self-pay | Admitting: Family Medicine

## 2022-12-28 ENCOUNTER — Ambulatory Visit: Payer: Medicare Other

## 2022-12-28 ENCOUNTER — Other Ambulatory Visit (INDEPENDENT_AMBULATORY_CARE_PROVIDER_SITE_OTHER): Payer: Self-pay | Admitting: Family Medicine

## 2022-12-28 NOTE — Progress Notes (Deleted)
[facility] PROGRESS NOTE    [facility] -[facility]IOLOGY  [place] [address]  Dept: [phone]  Dept Fax: [phone]       Patient Name: Emily Rowe, Emily Rowe    Date of Visit:  December 28, 2022  Date of Birth: 1951-02-14  AGE: 72 y.o.  Medical Record #: [id number]  Requesting Physician: Jamey Ripa, MD      CHIEF COMPLAINT: No chief complaint on file.      HISTORY OF PRESENT ILLNESS:    She is a pleasant 72 y.o. female who presents today for ***      PAST MEDICAL HISTORY: She has a past medical history of Anxiety, Asthma, Bowel perforation, Depression, Diabetes, Difficulty walking, Dysphagia, Gastroesophageal reflux disease, Hyperlipemia, Hypertensive disorder, Hypokalemia, Neuropathy, Post-operative nausea and vomiting, Sleep apnea (2017), Stroke (2012), TIA (transient ischemic attack), and Type 2 diabetes mellitus, controlled (2021). She has a past surgical history that includes Tonsillectomy; Reduction mammaplasty; urinary bladder; Colonoscopy; Colonoscopy (N/A, 09/17/2020); EGD (N/A, 10/28/2020); Hysterectomy; Oophorectomy (Bilateral); and Breast biopsy (Left).    ALLERGIES:   Allergies   Allergen Reactions    Codeine Nausea And Vomiting    Crestor [Rosuvastatin] Rash     D/c due to severe rash and higher blood sugars    Glucosamine Hives and Nausea And Vomiting    Latex Rash    Tree Nuts Hives     ESPECIALLY ALMONDS     Dairy [Milk-Related Compounds]     Eggs Or Egg-Derived Products      Patient had an allergy test and stated that she was allergic to eggs    Gabapentin      Dizziness and rash, irregular heart rate    Gluten Meal     Invokana [Canagliflozin]     Levemir [Insulin Detemir] Nausea And Vomiting    Lyrica [Pregabalin]      PT STATES IT MAKES HER NEUROPATHY WORSE STATES SHE FEELS LIKE SHE IS ON FIRE    Metformin      diarrhea    Repatha [Evolocumab] Headaches    Sucralfate Nausea And Vomiting     BODY  ACHES, FEVER    Tyler Aas [Insulin Degludec]     Victoza [Liraglutide] Nausea And Vomiting    Flaxseed [Flaxseed (Linseed)] Rash    Omega 3 [Fish Oil] Rash    Peanut-Containing Drug Products Rash       MEDICATIONS:   Patient's current medications were reviewed. ONLY Cardiac medications were updated unless others were addressed in assessment and plan.    Current Outpatient Medications:     aspirin 81 MG chewable tablet, Chew 1 tablet (81 mg) by mouth daily, Disp: , Rfl:     Cymbalta 20 MG capsule, Take 1 capsule (20 mg) by mouth daily, Disp: , Rfl:     empagliflozin (Jardiance) 10 MG tablet, Take 1 tablet (10 mg) by mouth daily, Disp: 90 tablet, Rfl: 3    ergocalciferol (ERGOCALCIFEROL) 1.25 MG (50000 UT) capsule, Take 1 capsule (50,000 Units) by mouth once a week, Disp: 12 capsule, Rfl: 1    glipiZIDE (GLUCOTROL) 10 MG 24 hr tablet, TAKE 1 TABLET BY MOUTH TWICE A DAY, Disp: 180 tablet, Rfl: 0    glucose blood test strip, Check sugar three times a day, ICD 10 E11.9, Disp: 100 each, Rfl: 10    hydrocortisone 2.5 % cream, as needed, Disp: , Rfl:     insulin glargine (LANTUS SOLOSTAR) 100  UNIT/ML injection pen, Inject 10 units into skin daily at bedtime ONLY AS NEEDED IF BLOOD SUGAR >OR = TO 170 LAST TAKEN 07/2020, Disp: 15 mL, Rfl: 1    LORazepam (ATIVAN) 0.5 MG tablet, Take 0.5 tablets (0.25 mg) by mouth nightly as needed for Anxiety, Disp: 20 tablet, Rfl: 0    losartan (COZAAR) 50 MG tablet, TAKE 1 TABLET(50 MG) BY MOUTH TWICE DAILY, Disp: 60 tablet, Rfl: 3    omeprazole (PriLOSEC) 20 MG capsule, 1 capsule (20 mg) as needed, Disp: , Rfl:     simvastatin (ZOCOR) 20 MG tablet, Take 1 tablet (20 mg) by mouth nightly One tab po qd, Disp: , Rfl:     spironolactone (ALDACTONE) 25 MG tablet, Take 0.5 tablets (12.5 mg) by mouth daily, Disp: 45 tablet, Rfl: 3    verapamil ER (CALAN-SR) 120 MG CR tablet, TAKE 1 TABLET(120 MG) BY MOUTH DAILY, Disp: 90 tablet, Rfl: 0     FAMILY HISTORY: family history includes Breast cancer (age  of onset: 30) in her daughter; Diabetes in her father and maternal grandmother; Heart attack in her mother; Stroke in her maternal grandfather and maternal grandmother.    SOCIAL HISTORY: She reports that she has never smoked. She has never been exposed to tobacco smoke. She has never used smokeless tobacco. She reports that she does not drink alcohol and does not use drugs.    PHYSICAL EXAMINATION    There were no vitals taken for this visit.    General Appearance:  Cooperative, alert, in no acute distress.    Neck: No carotid bruit, JVP normal.  Pulmonary: Clear to auscultation bilaterally, no wheezes, no rales, no rhonchi   Cardiovascular: Regular rate and rhythm, normal S1  and S2, No S3 or S4, no murmurs, no gallops, no rubs.  Extremities: No edema bilaterally.   Vascular: Radial pulses 2+ bilaterally.    BP Readings from Last 3 Encounters:   12/16/22 145/84   11/29/22 (!) 164/93   11/28/22 152/84     Wt Readings from Last 3 Encounters:   12/16/22 92.1 kg (203 lb)   12/07/22 88.5 kg (195 lb)   11/29/22 90.7 kg (200 lb)        ECG: ***      LABS REVIEWED:   Lab Results   Component Value Date    WBC 6.25 11/17/2022    HGB 13.6 11/17/2022    HCT 43.2 11/17/2022    PLT 151 11/17/2022     Lab Results   Component Value Date    GLU 166 (H) 11/17/2022    BUN 14.0 11/17/2022    CREAT 0.8 11/17/2022    NA 141 11/17/2022    K 4.1 11/17/2022    CL 106 11/17/2022    CO2 26 11/17/2022    AST 19 11/17/2022    ALT 16 11/17/2022    EGFR >60.0 11/17/2022     Lab Results   Component Value Date    MG 1.8 07/25/2019    TSH 2.10 11/17/2022    HGBA1C 9.1 (H) 11/17/2022    BNP 24 06/05/2018     Lab Results   Component Value Date    CHOL 249 (H) 11/17/2022    TRIG 142 11/17/2022    HDL 45 11/17/2022    LDL 176 (H) 11/17/2022         *** Most recent echo, nuclear study, and above listed lab reports reviewed today.      IMPRESSION:   Hypertension.  Poorly  controlled  Intolerance to metoprolol, lisinopril, carvedilol, chlorthalidone,  nifedipine  Benign palpitations via monitor 2018  Hyperlipidemia  LDL uncontrolled  Rosuvastatin 40  Did not tolerate Repatha  Obesity  Insulin-dependent diabetes, uncontrolled with hemoglobin A1c 9.1  Hemorrhagic stroke 2012  Patient report history of sleep study with no recommendation for CPAP  History of panic attacks and anxiety, fibromyalgia  Bilateral carotid disease less than 50% by Doppler 2022     PLAN:    Blood pressure is not controlled.  Patient has multiple intolerances.  Trialing spironolactone.  Repeat BMP in 2 weeks.  Follow-up with APP in 2 to 4 weeks.  Consider renal denervation in the future  Blood sugars are not controlled.  Follow-up with endocrinology for titration of pharmacotherapy  Exercise inhibited by globalized pain  Heart healthy diet difficult due to food intolerances to greens  Echocardiogram to reassess cardiac function given dyspnea  Lipids uncontrolled.  We will try inclisiran  ***                                               No orders of the defined types were placed in this encounter.      No orders of the defined types were placed in this encounter.      SIGNED:    Theodoro Clock, PA          This note was generated by the Dragon speech recognition and may contain errors or omissions not intended by the user. Grammatical errors, random word insertions, deletions, pronoun errors, and incomplete sentences are occasional consequences of this technology due to software limitations. Not all errors are caught or corrected. If there are questions or concerns about the content of this note or information contained within the body of this dictation, they should be addressed directly with the author for clarification.

## 2022-12-29 ENCOUNTER — Encounter (INDEPENDENT_AMBULATORY_CARE_PROVIDER_SITE_OTHER): Payer: Medicare Other | Admitting: Physician Assistant

## 2022-12-29 DIAGNOSIS — E782 Mixed hyperlipidemia: Secondary | ICD-10-CM

## 2022-12-29 DIAGNOSIS — Z8673 Personal history of transient ischemic attack (TIA), and cerebral infarction without residual deficits: Secondary | ICD-10-CM

## 2022-12-29 DIAGNOSIS — E669 Obesity, unspecified: Secondary | ICD-10-CM

## 2022-12-29 DIAGNOSIS — I10 Essential (primary) hypertension: Secondary | ICD-10-CM

## 2022-12-29 DIAGNOSIS — E1165 Type 2 diabetes mellitus with hyperglycemia: Secondary | ICD-10-CM

## 2022-12-29 DIAGNOSIS — I779 Disorder of arteries and arterioles, unspecified: Secondary | ICD-10-CM

## 2023-01-02 ENCOUNTER — Ambulatory Visit
Admission: RE | Admit: 2023-01-02 | Discharge: 2023-01-02 | Disposition: A | Discharge status: Home / Self Care | Payer: Medicare Other | Source: Ambulatory Visit | Attending: Cardiovascular Disease | Admitting: Cardiovascular Disease

## 2023-01-02 DIAGNOSIS — R0609 Other forms of dyspnea: Secondary | ICD-10-CM | POA: Insufficient documentation

## 2023-01-02 MED ORDER — SULFUR HEXAFLUORIDE MICROSPH 60.7-25 MG IJ SUSR
5.0000 mL | Freq: Once | INTRAMUSCULAR | Status: AC
Start: 2023-01-02 — End: 2023-01-02
  Administered 2023-01-02: 5 mL via INTRAVENOUS

## 2023-01-03 ENCOUNTER — Other Ambulatory Visit (INDEPENDENT_AMBULATORY_CARE_PROVIDER_SITE_OTHER): Payer: Self-pay | Admitting: Cardiovascular Disease

## 2023-01-03 DIAGNOSIS — I10 Essential (primary) hypertension: Secondary | ICD-10-CM

## 2023-01-03 LAB — ECHO ADULT TTE COMPLETE
AV Area (Cont Eq VTI): 3.69
AV Area (Cont Eq VTI): 3.69
AV Mean Gradient: 4
AV Peak Velocity: 1.42
Ao Root Diameter (2D): 3.2
IVS Diastolic Thickness (2D): 1.2
LA Dimension (2D): 3.3
LA Volume Index (BP A-L): 29
LVID diastole (2D): 3.8
LVID systole (2D): 2.7
MV Area (PHT): 2.963
MV E/A: 0.8
MV E/A: 0.8
MV E/e' (Average): 16.351
Mitral Valve Findings: NORMAL
Prox Ascending Aorta Diameter: 3.1
Prox Ascending Aorta Diameter: 3.2
Pulmonary Valve Findings: NORMAL
RV Basal Diastolic Dimension: 2.4
RV Function: NORMAL
Site RA Size (AS): NORMAL
Site RV Size (AS): NORMAL
TAPSE: 1.8
Tricuspid Valve Findings: NORMAL

## 2023-01-04 ENCOUNTER — Ambulatory Visit (INDEPENDENT_AMBULATORY_CARE_PROVIDER_SITE_OTHER): Payer: Self-pay

## 2023-01-04 ENCOUNTER — Ambulatory Visit (INDEPENDENT_AMBULATORY_CARE_PROVIDER_SITE_OTHER): Payer: Medicare Other

## 2023-01-04 DIAGNOSIS — E119 Type 2 diabetes mellitus without complications: Secondary | ICD-10-CM

## 2023-01-04 DIAGNOSIS — I10 Essential (primary) hypertension: Secondary | ICD-10-CM

## 2023-01-09 ENCOUNTER — Other Ambulatory Visit (HOSPITAL_BASED_OUTPATIENT_CLINIC_OR_DEPARTMENT_OTHER): Payer: Self-pay

## 2023-01-09 ENCOUNTER — Other Ambulatory Visit (HOSPITAL_BASED_OUTPATIENT_CLINIC_OR_DEPARTMENT_OTHER): Payer: Self-pay | Admitting: Endocrinology, Diabetes and Metabolism

## 2023-01-09 ENCOUNTER — Ambulatory Visit (HOSPITAL_BASED_OUTPATIENT_CLINIC_OR_DEPARTMENT_OTHER): Payer: Medicare Other | Admitting: Internal Medicine

## 2023-01-09 ENCOUNTER — Telehealth: Payer: Self-pay | Admitting: Internal Medicine

## 2023-01-09 DIAGNOSIS — E1165 Type 2 diabetes mellitus with hyperglycemia: Secondary | ICD-10-CM

## 2023-01-09 MED ORDER — LANTUS SOLOSTAR 100 UNIT/ML SC SOPN
PEN_INJECTOR | SUBCUTANEOUS | 1 refills | Status: DC
Start: 2023-01-09 — End: 2023-01-09

## 2023-01-09 MED ORDER — LANTUS SOLOSTAR 100 UNIT/ML SC SOPN
PEN_INJECTOR | SUBCUTANEOUS | 1 refills | Status: DC
Start: 2023-01-09 — End: 2023-02-13

## 2023-01-09 NOTE — Telephone Encounter (Signed)
Writer returned Ms. Kaveney' call center message. Patient stated that she missed today's appointment because she got sick on the way to the office (dizziness). States that she does not have any insulin. When she stopped by the office sometime last week a gentleman told that her refill request would be sent. She hasn't received anything. Writer confirmed Ms. Tortora's current dose of Lantus insulin and her pharmacy. A refill request was sent to the [facility]/[place]). Patient was told that Dr. Vertell Limber Rao's admin [facility]) will reach out to her to reschedule her missed appointment from today. Ms. Sabatini verbalized her understanding. Writer sent a message to Lesleigh Noe to reschedule Ms. Egy' missed appointment due to a sudden illness.    Patriciaann Clan, LPN II  [id number] am

## 2023-01-09 NOTE — Telephone Encounter (Signed)
Name, strength, directions of requested refill(s):  insulin glargine (LANTUS SOLOSTAR) 100 UNIT/ML injection pen   11/20/20  --  Andrez Grime, MD     Inject 10 units into skin daily at bedtime ONLY AS NEEDED IF BLOOD SUGAR >OR = TO 170  LAST TAKEN 07/2020       How much medication is remaining: N/A (patient has expired meds)    Pharmacy to send refill to or patient to pick up rx from office (mark requested pharmacy in BOLD):    [id number] [address], [place]  [place] [address]  Phone: [phone] Fax: [phone]      Please mark "X" next to the preferred call back number:    Mobile: [phone] (mobile) X   Home: [phone] (home)    Work: @WORKPHONE @        Medication refill request, see above. Thank you   Patient has been informed that medication refill requests should be called in up to one week prior to running out of medication.    Additional Notes:  Next Visit: 02/13/2023

## 2023-01-23 ENCOUNTER — Encounter (INDEPENDENT_AMBULATORY_CARE_PROVIDER_SITE_OTHER): Payer: Self-pay

## 2023-02-03 ENCOUNTER — Encounter (INDEPENDENT_AMBULATORY_CARE_PROVIDER_SITE_OTHER): Payer: Self-pay | Admitting: Family Medicine

## 2023-02-06 ENCOUNTER — Ambulatory Visit (INDEPENDENT_AMBULATORY_CARE_PROVIDER_SITE_OTHER): Payer: Medicare Other

## 2023-02-06 DIAGNOSIS — E119 Type 2 diabetes mellitus without complications: Secondary | ICD-10-CM

## 2023-02-06 DIAGNOSIS — I10 Essential (primary) hypertension: Secondary | ICD-10-CM

## 2023-02-13 ENCOUNTER — Encounter (HOSPITAL_BASED_OUTPATIENT_CLINIC_OR_DEPARTMENT_OTHER): Payer: Self-pay | Admitting: Internal Medicine

## 2023-02-13 ENCOUNTER — Ambulatory Visit (INDEPENDENT_AMBULATORY_CARE_PROVIDER_SITE_OTHER): Payer: Medicare Other | Admitting: Internal Medicine

## 2023-02-13 VITALS — BP 175/97 | HR 74 | Temp 97.7°F | Resp 18 | Ht 66.0 in | Wt 203.4 lb

## 2023-02-13 DIAGNOSIS — Z794 Long term (current) use of insulin: Secondary | ICD-10-CM

## 2023-02-13 DIAGNOSIS — Z6832 Body mass index (BMI) 32.0-32.9, adult: Secondary | ICD-10-CM

## 2023-02-13 DIAGNOSIS — E1165 Type 2 diabetes mellitus with hyperglycemia: Secondary | ICD-10-CM

## 2023-02-13 DIAGNOSIS — E782 Mixed hyperlipidemia: Secondary | ICD-10-CM

## 2023-02-13 DIAGNOSIS — E669 Obesity, unspecified: Secondary | ICD-10-CM

## 2023-02-13 DIAGNOSIS — I10 Essential (primary) hypertension: Secondary | ICD-10-CM

## 2023-02-13 LAB — POCT GLUCOSE: Whole Blood Glucose POCT: 215 mg/dL — AB (ref 70–100)

## 2023-02-13 MED ORDER — LANTUS SOLOSTAR 100 UNIT/ML SC SOPN
PEN_INJECTOR | SUBCUTANEOUS | 1 refills | Status: AC
Start: 2023-02-13 — End: ?

## 2023-02-13 MED ORDER — VERAPAMIL HCL ER 180 MG PO CP24
180.0000 mg | ORAL_CAPSULE | Freq: Every day | ORAL | 1 refills | Status: DC
Start: 2023-02-13 — End: 2023-10-05

## 2023-02-13 MED ORDER — EMPAGLIFLOZIN 10 MG PO TABS
10.0000 mg | ORAL_TABLET | Freq: Every day | ORAL | 3 refills | Status: DC
Start: 2023-02-13 — End: 2023-05-20

## 2023-02-13 NOTE — Patient Instructions (Addendum)
Please complete lab tests prior to follow up visit. Your lab results will be reviewed at your next visit with me.      Recommend increasing dose of Verapamil to 180 mg daily given high blood pressure. Please follow up with primary care physician for continued blood pressure monitoring.

## 2023-02-13 NOTE — Progress Notes (Signed)
Subjective:      Date: 02/13/2023 11:11 AM   Patient ID: Emily Rowe is a 72 y.o. female.    Chief Complaint:  Chief Complaint   Patient presents with    Diabetes     BS:   215         HPI  Visit type: follow-up - type II DM   Diagnosed: over 5 years ago  Type: Insulin requiring  Complications: hyperglycemia  Control: inadequate control  Comorbid illness: hyperlipidemia  Last follow-up:  11/10/21 with Dr. Maeola Harman      Diabetes Medication Regimen: Glipizide XL 20 mg daily, jardiance 10 mg daily , Lantus 10 units daily at night (not taking lantus at present), taking Lantus only if HS glucose is 170 or above. Did not try jardiance.     Prior medications: Unable to tolerate Metformin due to diarrhea, unable to tolerate Victoza due to n/v, invokana. Levemir (caused nausea and vomiting), Evaristo Bury (did not tolerate, not sure reason for intolerance)     Taking potassium pill daily along with eating sweet potato and avocado everyday.     Home glucose readings:  recall. Not consistent checking BG.  Recent blood glucose readings in the 200s after she ran out of insulin.        Podiatric assessment: admits LE burning/tingling due to hx of stroke   Exercise:  walks occasionally   Diet: moderate compliance with recommended diet;   Response to therapy: with hemoglobin A1C above goal  A1c trend: improving     Lab Results   Component Value Date    HGBA1C 9.1 (H) 11/17/2022    HGBA1C 9.0 (H) 08/09/2022    HGBA1C 8.9 (A) 05/11/2022     Trend is above goal  Additional concerns: In addition, pt has hyperlipidemia and is compliant with lipid therapy.  Pt denies side effects of lipid therapy - specifically denies myalgia., In addition, pt has stable essential HTN with no evidence of CHF or proteinuria.  The patient is compliant with anti-hypertensive therapy and denies side effects to therapy.  Pt denies CP, SOB, dizziness, orthopnea, PND or edema.     Hx of CVA, with residual right sided weakness and antalgic gait, no cognitive  deficits.     INTERVAL HISTORY  Elements of HPI were reviewed and updated. Did not receive lantus from pharmacy since 3/24 which resulted in higher blood glucose readings.    Hypertension  BP significantly elevated  - no evidence of CHF, denies chest pain, heart palpitations or fluid retention      24h diet recall:  BF- 1 toast (cannot have eggs)  Lunch-Turkey sandwich  Dinner/ Snack: Salad, arugula, mangoes    Recently diagnosed with lupus in 2/24. Reports increase in stress related to death in her family.    ROS:  A complete 12 point ROS was obtained. Pertinent positives and negatives as noted in HPI. All other systems are negative.      Vitals:  BP (!) 158/92 (BP Site: Left arm, Patient Position: Sitting, Cuff Size: Large)   Pulse 76   Temp 97.7 F (36.5 C) (Temporal)   Resp 18   Ht 1.676 m (5\' 6" )   Wt 92.3 kg (203 lb 6.4 oz)   SpO2 94%   BMI 32.83 kg/m    Wt Readings from Last 3 Encounters:   02/13/23 92.3 kg (203 lb 6.4 oz)   12/16/22 92.1 kg (203 lb)   12/07/22 88.5 kg (195 lb)  BP is high today 162/95 repeat measurement. Patient states that she just took her BP medication.      Physical Exam   Constitutional: She is oriented to person, place, and time. She appears well-developed and well-nourished.   HENT:   Head: Normocephalic and atraumatic.   Mouth/Throat: Oropharynx is clear and moist.   Eyes: Conjunctivae and EOM are normal.    Neck: Normal range of motion. No thyromegaly present.   Neurological: She is alert and oriented to person, place, and time.   Psychiatric: She has a normal mood and affect.   Normal sensation on 10g monofilament testing RLE, slightly decreased sensation LLE.        Assessment and Plan     Diagnoses and plan were reviewed and updated    1. Uncontrolled type 2 diabetes mellitus with hyperglycemia  -   Lab Results   Component Value Date    HGBA1C 9.1 (H) 11/17/2022    HGBA1C 9.0 (H) 08/09/2022    HGBA1C 8.9 (A) 05/11/2022       Current A1C is elevated above goal.  -   Recommend increasing the dose of SGLT-2 Inhibitor therapy.  Side effects of SGLT2 inhibitor (UTI, mycotic infection, polyuria, change in renal function, hypotension) discussed with patient.  -  Recommend home glucometer testing twice a day (pre-breakfast or 2 hrs post-prandial).  Risk & Benefits of the new medication(s) were explained to the patient (and family) who verbalized understanding & agreed to the treatment plan. Patient (family) encouraged to contact me/clinical staff with any questions/concerns  Reports good glycemic control when switching from tradjenta to jardiance.  -  recommend carb consistent diet and stay physically active as tolerated  - referred to CDE for diet counseling, new referral entered, prefers in person appointment.    Completed diabetes education group classes 3 years ago and requesting 1:1 support for education.    - empagliflozin (JARDIANCE) 10 MG tablet; Take 1 tablet (10 mg) by mouth every morning  Dispense: 90 tablet; Refill: 1  - Urine Microalbumin Random  - Hemoglobin A1C  - Comprehensive metabolic panel  - TSH  - Ambulatory referral to Diabetic Education; Future  - Urine Microalbumin Random; Future  - Hemoglobin A1C; Future  - Comprehensive metabolic panel; Future  - Lipid panel; Future    2. Obesity due to excess calories (BMI 32-32.9)  - Recommend calorie restricted diet and exercise 150 min/week combined cardio and strength training   - patient has hx of intolerance to glp-1 ra   - referred to CDE     3. Mixed hyperlipidemia  Lab Results   Component Value Date    CHOL 249 (H) 11/17/2022    CHOL 213 (H) 08/09/2022    CHOL 206 (H) 02/08/2022     Lab Results   Component Value Date    HDL 45 11/17/2022    HDL 47 08/09/2022    HDL 49 02/08/2022     Lab Results   Component Value Date    LDL 176 (H) 11/17/2022    LDL 140 (H) 08/09/2022    LDL 135 (H) 02/08/2022     Lab Results   Component Value Date    TRIG 142 11/17/2022    TRIG 130 08/09/2022    TRIG 108 02/08/2022       - LDL is  elevated  - hx of taking Repatha, reports he stopped Repatha because it has increased sugars  On Rosuvastatin 40 mg QHS at present.    -  rosuvastatin (CRESTOR) 40 MG tablet; Take 1 tablet (40 mg) by mouth daily  Dispense: 90 tablet; Refill: 1  - Comprehensive metabolic panel  - Lipid panel  - Comprehensive metabolic panel; Future  - Lipid panel; Future    4. Essential hypertension  - per report ambulatory BP is well controlled, has element of white coat HTN.   - on Losartan 100 mg daily. Recommend increasing dose of verapamil ER to 180 mg daily and continue follow up with primary care physician regarding blood pressure monitoring.    - Comprehensive metabolic panel; Future    5. Long-term insulin use    RTC in 3-4 months with me and F/U with Deanna Artis in 6 weeks (will need A1c checked during f/u with Deanna Artis, NP    Tanya Nones MD MPH  Endocrinologist, [facility]

## 2023-02-13 NOTE — Addendum Note (Signed)
Addended by: Tanya Nones on: 02/13/2023 05:31 PM     Modules accepted: Level of Service

## 2023-02-16 NOTE — Telephone Encounter (Signed)
Received call from Vernona Rieger, nurse with [facility]. Pt has been approved to receive Leqvio injections. The pt has been contacted multiple times though no answer/return call to schedule. Per Vernona Rieger, calling to relay if no call back, referral will be placed on hold.      Will forward to PA team for review.

## 2023-02-20 ENCOUNTER — Ambulatory Visit (HOSPITAL_BASED_OUTPATIENT_CLINIC_OR_DEPARTMENT_OTHER): Payer: Medicare Other | Admitting: Internal Medicine

## 2023-02-20 NOTE — Telephone Encounter (Signed)
Follow up call to pt. Emily Rowe was approved. Pt will call [facility] for an appointment.

## 2023-02-22 ENCOUNTER — Encounter (INDEPENDENT_AMBULATORY_CARE_PROVIDER_SITE_OTHER): Payer: Self-pay

## 2023-02-23 ENCOUNTER — Other Ambulatory Visit (INDEPENDENT_AMBULATORY_CARE_PROVIDER_SITE_OTHER): Payer: Self-pay | Admitting: Specialist

## 2023-02-27 ENCOUNTER — Encounter (INDEPENDENT_AMBULATORY_CARE_PROVIDER_SITE_OTHER): Payer: Self-pay | Admitting: Family Medicine

## 2023-02-27 ENCOUNTER — Ambulatory Visit (INDEPENDENT_AMBULATORY_CARE_PROVIDER_SITE_OTHER): Payer: Medicare Other | Admitting: Family Medicine

## 2023-02-27 VITALS — BP 172/86 | HR 68 | Temp 97.7°F | Resp 16 | Wt 203.0 lb

## 2023-02-27 DIAGNOSIS — E1142 Type 2 diabetes mellitus with diabetic polyneuropathy: Secondary | ICD-10-CM

## 2023-02-27 DIAGNOSIS — E1165 Type 2 diabetes mellitus with hyperglycemia: Secondary | ICD-10-CM

## 2023-02-27 DIAGNOSIS — I10 Essential (primary) hypertension: Secondary | ICD-10-CM

## 2023-02-27 DIAGNOSIS — E559 Vitamin D deficiency, unspecified: Secondary | ICD-10-CM

## 2023-02-27 DIAGNOSIS — I69359 Hemiplegia and hemiparesis following cerebral infarction affecting unspecified side: Secondary | ICD-10-CM

## 2023-02-27 DIAGNOSIS — F32A Depression, unspecified: Secondary | ICD-10-CM

## 2023-02-27 DIAGNOSIS — K828 Other specified diseases of gallbladder: Secondary | ICD-10-CM

## 2023-02-27 DIAGNOSIS — E782 Mixed hyperlipidemia: Secondary | ICD-10-CM

## 2023-02-27 LAB — COMPREHENSIVE METABOLIC PANEL
ALT: 19 U/L (ref 0–55)
AST (SGOT): 20 U/L (ref 5–41)
Albumin/Globulin Ratio: 1.3 (ref 0.9–2.2)
Albumin: 3.9 g/dL (ref 3.5–5.0)
Alkaline Phosphatase: 94 U/L (ref 37–117)
Anion Gap: 8 (ref 5.0–15.0)
BUN: 19 mg/dL (ref 7.0–21.0)
Bilirubin, Total: 0.4 mg/dL (ref 0.2–1.2)
CO2: 29 mEq/L (ref 17–29)
Calcium: 9.7 mg/dL (ref 8.5–10.5)
Chloride: 104 mEq/L (ref 99–111)
Creatinine: 0.9 mg/dL (ref 0.4–1.0)
Globulin: 3.1 g/dL (ref 2.0–3.6)
Glucose: 199 mg/dL — ABNORMAL HIGH (ref 70–100)
Potassium: 4.6 mEq/L (ref 3.5–5.3)
Protein, Total: 7 g/dL (ref 6.0–8.3)
Sodium: 141 mEq/L (ref 135–145)
eGFR: >60 mL/min/{1.73_m2} (ref >=60)

## 2023-02-27 LAB — LIPID PANEL
Cholesterol / HDL Ratio: 4.9 Index
Cholesterol: 239 mg/dL — ABNORMAL HIGH (ref 0–199)
HDL: 49 mg/dL (ref 40–9999)
LDL Calculated: 168 mg/dL — ABNORMAL HIGH (ref 0–99)
Triglycerides: 112 mg/dL (ref 34–149)
VLDL Calculated: 22 mg/dL (ref 10–40)

## 2023-02-27 LAB — HEMOGLOBIN A1C
Average Estimated Glucose: 217.3 mg/dL
Hemoglobin A1C: 9.2 % — ABNORMAL HIGH (ref 4.6–5.6)

## 2023-02-27 LAB — URINE MICROALBUMIN, RANDOM
Urine Creatinine, Random: 68.4 mg/dL
Urine Microalbumin, Random: 5.6 ug/ml (ref 0.0–30.0)
Urine Microalbumin/Creatinine Ratio: 8 ug/mg (ref 0–30)

## 2023-02-27 LAB — HEMOLYSIS INDEX(SOFT): Hemolysis Index: 19 Index (ref 0–24)

## 2023-02-27 NOTE — Progress Notes (Signed)
Have you seen any specialists/other providers since your last visit with [place]?    Yes-Rheumatology-patient unsure of name    Arm preference verified?   Yes    Health Maintenance Due   Topic Date Due    Advance Directive on File  Never done    Shingrix Vaccine 50+ (1) Never done    COVID-19 Vaccine (4 - 2023-24 season) 06/17/2022    Tetanus Ten-Year  09/04/2022           Date: 02/27/2023 9:03 AM   Patient ID: Emily Rowe is a 72 y.o. female.    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 Dr Jerene Pitch  Taking cymbalta 20 mg    Seen urologist  States no issues    Seen opthal -had glaucom Rx Bilat    Since last visit seen cardio  BP med adjusted- spirolactone on -half tab  BP high today- always high in AM- has pain she says    Recurrent GI s/s  Seen GI  NM Hepatobiliary W Cck    Result Date: 02/23/2023  1. Normal hepatobiliary scan. 2. Abnormal gallbladder contractile response status post CCK administration with low gallbladder ejection fraction 18%. Electronically signed by: Clide Cliff M.D.  [facility] WG: 02/23/23     Seen Recommend neurology consult for further diagnostic intervention. And given cymbalta but not started    12/01/22 mammo Negative    Seen ortho - was told she could get synvisc    Seen opthal - + glaucoma and seeing another MD      DM- seeing Dr Smith Robert   Seen July 2023- A1c 8.9- added jardiance 10 mg- urinating lot, inclding glipizide, insulin 10 lantus- uses only if needed, tradenta    Never started PT as tender at all times to touch    Mood fine, not anxious as before  Seeing therapist- appt today  Stopped  zoloft- " I do not feel I need anymore"    Hypertension-chronic  She feels her blood pressure has been stable at home  Denies having any headache, chest pain shortness of breath    Hyperlipidemia-seeing cardiology and getting Repatha- got allergy  So back on zocor 20 mg- doesnot take daily- states gets rash and GI S/s      HPI       The other 14 point review systems are negative except those in HPI.    Current Medications:  No outpatient medications have been marked as taking for the 02/27/23 encounter (Office Visit) with Rebecca Eaton, MD.       Past Medical History:  Past Medical History:   Diagnosis Date    Anxiety     Asthma     allergic reaction to mold, not since 20 years, HAS WHEEZING IN COLD WEATHER. NO INHALERS    Bowel perforation     COLONOSCOPY 09/2019    Depression     Diabetes     Difficulty walking     WEAK RIGHT LEG AFTER CVA 2021    Dysphagia  BIG PILLS ONLY     Gastroesophageal reflux disease     PREVACID OTC PRN     Hyperlipemia     Hypertensive disorder     Hypokalemia     CMP REQ PCP 09/14/20    Neuropathy     NUMBNESS RIGHT UE AND LE     Post-operative nausea and vomiting     ALWAYS WITH ANESTHESIA     Sleep apnea 2017    NO CPAP NEEDED PER PT     Stroke 2012     ARM, LEG WEAK. WALKS WITH CANE, WALKER    TIA (transient ischemic attack)     MULTIPLE BEFORE CVA 2012    Type 2 diabetes mellitus, controlled 2021    MANAGED BY DR Binnie Rail RAO. AM FSB 117-140. TAKES LANTUS PRN IN AM. HAIC 7.9       Past Surgical History:  Past Surgical History:   Procedure Laterality Date    BREAST BIOPSY Left     benign lump biopsy    COLONOSCOPY, DIAGNOSTIC (SCREENING)      X 2    COLONOSCOPY, DIAGNOSTIC (SCREENING) N/A 09/17/2020    Procedure: COLONOSCOPY;  Surgeon: Edrick Oh, MD;  Location: [facility];  Service: Gastroenterology;  Laterality: N/A;    EGD N/A 10/28/2020    Procedure: EGD w/ bx's;  Surgeon: Philbert Riser, MD;  Location: [facility];  Service: Gastroenterology;   Laterality: N/A;    HYSTERECTOMY      2005    OOPHORECTOMY Bilateral     2005    REDUCTION MAMMAPLASTY      TONSILLECTOMY      urinary bladder      BLADDER LIFT        Family History:  Family History   Problem Relation Age of Onset    Heart attack Mother     Diabetes Father     Diabetes Maternal Grandmother     Stroke Maternal Grandmother     Stroke Maternal Grandfather     Breast cancer Daughter 30    Colon cancer Neg Hx        Social History:   reports that she has never smoked. She has never been exposed to tobacco smoke. She has never used smokeless tobacco. She reports that she does not drink alcohol and does not use drugs.    Allergies:  Allergies   Allergen Reactions    Codeine Nausea And Vomiting    Crestor [Rosuvastatin] Rash     D/c due to severe rash and higher blood sugars    Glucosamine Hives and Nausea And Vomiting    Latex Rash    Tree Nuts Hives     ESPECIALLY ALMONDS     Dairy [Milk-Related Compounds]     Egg-Derived Products      Patient had an allergy test and stated that she was allergic to eggs    Gabapentin      Dizziness and rash, irregular heart rate    Gluten Meal     Invokana [Canagliflozin]     Levemir [Insulin Detemir] Nausea And Vomiting    Lyrica [Pregabalin]      PT STATES IT MAKES HER NEUROPATHY WORSE STATES SHE FEELS LIKE SHE IS ON FIRE    Metformin      diarrhea    Repatha [Evolocumab] Headaches    Sucralfate Nausea And Vomiting     BODY ACHES, FEVER    Evaristo Bury [Insulin Degludec]     Victoza [Liraglutide]  Nausea And Vomiting    Flaxseed [Flaxseed (Linseed)] Rash    Omega 3 [Fish Oil] Rash    Peanut-Containing Drug Products Rash        The following sections were reviewed this encounter by the provider:   Tobacco  Allergies  Meds  Problems  Med Hx  Surg Hx  Fam Hx         VITALS:  Vitals:    02/27/23 0836   BP: 172/86   BP Site: Left arm   Patient Position: Sitting   Cuff Size: Large   Pulse: 68   Resp: 16   Temp: 97.7 F (36.5 C)   TempSrc: Oral   SpO2: 97%   Weight: 92.1 kg  (203 lb)      ROS:  General/Constitutional:   Denies Chills. Denies Fatigue. Denies Fever.   Ophthalmologic:   Denies Blurred vision. Denies Eye Pain.   ENT:   Denies Nasal Discharge. Denies Ear pain. Denies Sinus pain.   Endocrine:   Denies Polydipsia. Denies Polyuria.   Respiratory:   Denies Cough. Denies Orthopnea. Denies Shortness of breath. Denies Wheezing.   Cardiovascular:   Denies Chest pain. Denies Chest pain with exertion. Denies Leg Claudication. Denies      Palpitations. Denies Swelling in hands/feet.   Gastrointestinal:   Denies Abdominal pain. Denies Blood in stool. Denies Constipation. Denies Diarrhea.      Denies Heartburn. Denies Nausea. Denies Vomiting.   Genitourinary:   Denies Blood in urine. Denies Frequent urination. Denies Painful urination.   Musculoskeletal:   + Leg cramps. + Muscle aches.   Skin:   Denies Skin lesion(s).   Neurologic:   Denies Dizziness. +Gait abnormality. Denies Headache. +  Tingling/Numbness.      Physical Exam:  Alert and oriented. Affect is normal and appropriate. Clear thought process with normal reasoning and conversational tone and logic. No psychomotor retardation. Not in any acute distress  Skin: no rash or abnormal lesions  Eyes - normal eye movements.  Pupils equal and reactive to light  Eyelid normal on exam  Ears - TM clear, external ear normal  Nose - no acute changes  Throat - Oral mucosa moist and pink.  Neck- LEFT SIDE neck thyroid nodule, nontender, no palpable cervical lymph nodes  HEART: S1S2 heard, regular rate and rhythm  LUNGS: good air exchange, no crackles or ronchi or wheezing  ABDOMEN: Positive bowel sounds, no hepatospleenomegaly appreciated, no tenderness.  She has some diffuse discomfort hard to appreciate any Murphy sign.  Back: Normal curvature, no tenderness.   Extremities: FROM, no deformities, no edema, no erythema.   No knee effusion.   mild tendernesson the joint line.  Neuro: Physiological, speech better than before.   Right side  weakness.   Restricted and only able to abduct <20%, Right leg weakness.   Strength RUE and RLE 3/5.        Psych-good eye contact  Train of thought normal  Judgment normal      Lab Results   Component Value Date    WBC 6.25 11/17/2022    HGB 13.6 11/17/2022    HCT 43.2 11/17/2022    PLT 151 11/17/2022    CHOL 249 (H) 11/17/2022    TRIG 142 11/17/2022    HDL 45 11/17/2022    LDL 176 (H) 11/17/2022    ALT 16 11/17/2022    AST 19 11/17/2022    NA 141 11/17/2022    K 4.1 11/17/2022    CL 106 11/17/2022  CREAT 0.8 11/17/2022    BUN 14.0 11/17/2022    CO2 26 11/17/2022    TSH 2.10 11/17/2022    INR 1.0 07/14/2021    GLU 166 (H) 11/17/2022    HGBA1C 9.1 (H) 11/17/2022         ASSESSMENT AND PLAN:  1. Essential hypertension  - Comprehensive metabolic panel  - Hemoglobin A1C  - Lipid panel  - Urine Microalbumin Random    2. Uncontrolled type 2 diabetes mellitus with hyperglycemia  - Comprehensive metabolic panel  - Hemoglobin A1C  - Lipid panel  - Urine Microalbumin Random    3. Vitamin D deficiency    4. History of hemorrhagic stroke with residual hemiparesis    5. Depression, unspecified depression type    6. Diabetic peripheral neuropathy  - Comprehensive metabolic panel  - Hemoglobin A1C  - Lipid panel  - Urine Microalbumin Random    7. Mixed hyperlipidemia  - Comprehensive metabolic panel  - Hemoglobin A1C  - Lipid panel  - Urine Microalbumin Random    8. Biliary dyskinesia        Patient noticing GI signs and symptoms for a long time  Her biliary dyskinesia seems to be worse-her recent HIDA scan showing an EF of 18%  She is waiting to hear this results from her GI  She may need to see a gastroenterologist and then be referred to general surgeon for cholecystectomy-patient voiced understanding    Diabetes-poorly controlled with an A1c of 9.9  Under the care of endocrinologist Dr. Maeola Harman   she has been added Jardiance  She has no side effects to it  Recheck levels  She is up-to-date on ophthalmic eye exam  Status  post glaucoma surgery  Denies having any foot problem  Follows up with podiatrist Dr. Kristeen Mans    Uncontrolled blood pressure-she is at very high risk for intracranial bleed  She already has a history of stroke  Discussed this in detail  Patient has had chronic fluctuations in her blood pressure  She states that she had just taken her medication and she will monitor her blood pressure and also follow-up with cardiology  She is currently on verapamil, losartan and recently spironolactone  Risks of high blood pressure discussed in detail     Anxiety-at this time she has been started again on Cymbalta by her neuro  She has tried multiple medication including Cymbalta previously with me but never stayed on it  I strongly recommended that she continue taking this medication    Significant body aches  Rheumatological evaluation did not show any findings suggestive of lupus according to patient  She is going to give [place] the name of the rheumatologist she saw so we can get a consult note  Importance of following up with the physical therapist and having physical therapy to improve her pain discussed  Patient voiced understanding    Panc cyst - being monitored  She is under the care of GI Dr. Philbert Riser office        FOLLOW-UP:  Return in about 3 months (around 05/30/2023) for Diabetes.      This note was generated by the Epic EMR system/ Dragon speech recognition and may contain inherent errors or omissions not intended by the user. Grammatical errors, random word insertions, deletions, pronoun errors and incomplete sentences are occasional consequences of this technology due to software limitations. Not all errors are caught or corrected. If there are questions or concerns about the content of  this note or information contained within the body of this dictation they should be addressed directly with the author for clarification      Vicky Schleich Bernadene Person, MD

## 2023-03-08 ENCOUNTER — Ambulatory Visit (INDEPENDENT_AMBULATORY_CARE_PROVIDER_SITE_OTHER): Payer: Medicare Other

## 2023-03-08 DIAGNOSIS — E119 Type 2 diabetes mellitus without complications: Secondary | ICD-10-CM

## 2023-03-08 DIAGNOSIS — I10 Essential (primary) hypertension: Secondary | ICD-10-CM

## 2023-03-10 ENCOUNTER — Encounter (INDEPENDENT_AMBULATORY_CARE_PROVIDER_SITE_OTHER): Payer: Self-pay

## 2023-03-10 NOTE — Progress Notes (Signed)
OV at [employer] for [place] injection

## 2023-03-14 HISTORY — PX: GALLBLADDER SURGERY: SHX652

## 2023-03-25 ENCOUNTER — Encounter (INDEPENDENT_AMBULATORY_CARE_PROVIDER_SITE_OTHER): Payer: Self-pay

## 2023-03-26 ENCOUNTER — Encounter (INDEPENDENT_AMBULATORY_CARE_PROVIDER_SITE_OTHER): Payer: Self-pay

## 2023-03-27 ENCOUNTER — Ambulatory Visit (HOSPITAL_BASED_OUTPATIENT_CLINIC_OR_DEPARTMENT_OTHER): Payer: Medicare Other | Admitting: Family Nurse Practitioner

## 2023-04-10 ENCOUNTER — Ambulatory Visit (INDEPENDENT_AMBULATORY_CARE_PROVIDER_SITE_OTHER): Payer: Medicare Other

## 2023-04-10 DIAGNOSIS — I10 Essential (primary) hypertension: Secondary | ICD-10-CM

## 2023-04-10 DIAGNOSIS — E119 Type 2 diabetes mellitus without complications: Secondary | ICD-10-CM

## 2023-04-24 ENCOUNTER — Other Ambulatory Visit (HOSPITAL_BASED_OUTPATIENT_CLINIC_OR_DEPARTMENT_OTHER): Payer: Self-pay

## 2023-04-24 ENCOUNTER — Encounter (INDEPENDENT_AMBULATORY_CARE_PROVIDER_SITE_OTHER): Payer: Self-pay

## 2023-04-24 DIAGNOSIS — E1165 Type 2 diabetes mellitus with hyperglycemia: Secondary | ICD-10-CM

## 2023-04-24 MED ORDER — GLIPIZIDE ER 10 MG PO TB24
10.0000 mg | ORAL_TABLET | Freq: Two times a day (BID) | ORAL | 0 refills | Status: DC
Start: 2023-04-24 — End: 2023-07-19

## 2023-05-02 ENCOUNTER — Telehealth (HOSPITAL_BASED_OUTPATIENT_CLINIC_OR_DEPARTMENT_OTHER): Payer: Self-pay | Admitting: Internal Medicine

## 2023-05-05 ENCOUNTER — Other Ambulatory Visit (INDEPENDENT_AMBULATORY_CARE_PROVIDER_SITE_OTHER): Payer: Self-pay | Admitting: Family Medicine

## 2023-05-05 DIAGNOSIS — I10 Essential (primary) hypertension: Secondary | ICD-10-CM

## 2023-05-05 DIAGNOSIS — E559 Vitamin D deficiency, unspecified: Secondary | ICD-10-CM

## 2023-05-05 MED ORDER — LOSARTAN POTASSIUM 50 MG PO TABS
50.0000 mg | ORAL_TABLET | Freq: Two times a day (BID) | ORAL | 3 refills | Status: DC
Start: 2023-05-05 — End: 2023-08-31

## 2023-05-05 NOTE — Telephone Encounter (Signed)
Name, strength, directions of requested refill(s):    losartan (COZAAR) 50 MG tablet     How much medication is remaining:  Out    Pharmacy to send refill to or patient to pick up rx from office (mark requested pharmacy in BOLD):      [id number] [place], [place] - [address] AT [facility] [address]  [address]  [place] [address]  Phone: [phone] Fax: [phone]    [facility], [place] - [address]  [address]  [place] [address]  Phone: [phone] Fax: [phone]        Please mark "X" next to the preferred call back number:    Mobile: [phone] (mobile) x   Home: [phone] (home)    Work: @WORKPHONE @      Additional Notes:    Next Visit:  05/30/23

## 2023-05-05 NOTE — Telephone Encounter (Signed)
Medication(s) Requested: Losartan 50mg  tablet  Medication(s) last refilled: Nov 04 2022, Qty 60, Refills 3  Last visit: Feb 27 2023  Patient has an upcoming appointment on May 30 2023.

## 2023-05-08 ENCOUNTER — Ambulatory Visit (INDEPENDENT_AMBULATORY_CARE_PROVIDER_SITE_OTHER): Payer: Medicare Other

## 2023-05-08 DIAGNOSIS — E119 Type 2 diabetes mellitus without complications: Secondary | ICD-10-CM

## 2023-05-08 DIAGNOSIS — I10 Essential (primary) hypertension: Secondary | ICD-10-CM

## 2023-05-15 ENCOUNTER — Ambulatory Visit (HOSPITAL_BASED_OUTPATIENT_CLINIC_OR_DEPARTMENT_OTHER): Payer: Medicare Other | Admitting: Internal Medicine

## 2023-05-16 ENCOUNTER — Telehealth (HOSPITAL_BASED_OUTPATIENT_CLINIC_OR_DEPARTMENT_OTHER): Payer: Self-pay | Admitting: Internal Medicine

## 2023-05-16 NOTE — Telephone Encounter (Signed)
The pt had a missed appt yesterday due to being ill, and has been rescheduled for a F/U appt on 07/17/2023 and added to the wait list. The pt has concerns about elevated blood sugar and A1c levels, and would like to speak with the clinical staff. Please contact the pt to discuss concerns

## 2023-05-16 NOTE — Telephone Encounter (Signed)
Called pt to discuss her concerns. Pt has been noticing increasing in glucose levels. The highest she has seen was 174-180. Reports no symptoms and is taking all of her medication at this time.     Pt is concerned about missing her appt and waiting until September to speak with Dr. Maeola Harman. Pt has appt with PCP on 05/25/23. Writer advised pt to make the PCP appt and keep fu appt with Dr. Maeola Harman.

## 2023-05-18 ENCOUNTER — Other Ambulatory Visit (HOSPITAL_BASED_OUTPATIENT_CLINIC_OR_DEPARTMENT_OTHER): Payer: Self-pay | Admitting: Internal Medicine

## 2023-05-18 DIAGNOSIS — Z794 Long term (current) use of insulin: Secondary | ICD-10-CM

## 2023-05-18 DIAGNOSIS — E1165 Type 2 diabetes mellitus with hyperglycemia: Secondary | ICD-10-CM

## 2023-05-19 ENCOUNTER — Telehealth (HOSPITAL_BASED_OUTPATIENT_CLINIC_OR_DEPARTMENT_OTHER): Payer: Self-pay

## 2023-05-19 ENCOUNTER — Telehealth: Payer: Self-pay

## 2023-05-19 NOTE — Telephone Encounter (Signed)
Called pt to schedule a follow up with Emily Rowe some time this month. No answer, lvmfcb.

## 2023-05-24 ENCOUNTER — Ambulatory Visit (INDEPENDENT_AMBULATORY_CARE_PROVIDER_SITE_OTHER): Payer: Medicare Other

## 2023-05-24 DIAGNOSIS — I10 Essential (primary) hypertension: Secondary | ICD-10-CM

## 2023-05-24 DIAGNOSIS — E119 Type 2 diabetes mellitus without complications: Secondary | ICD-10-CM

## 2023-05-25 ENCOUNTER — Encounter (INDEPENDENT_AMBULATORY_CARE_PROVIDER_SITE_OTHER): Payer: Self-pay

## 2023-05-26 ENCOUNTER — Encounter (INDEPENDENT_AMBULATORY_CARE_PROVIDER_SITE_OTHER): Payer: Self-pay

## 2023-05-30 ENCOUNTER — Encounter (INDEPENDENT_AMBULATORY_CARE_PROVIDER_SITE_OTHER): Payer: Self-pay | Admitting: Family Medicine

## 2023-05-30 ENCOUNTER — Ambulatory Visit (FREE_STANDING_LABORATORY_FACILITY): Payer: Medicare Other | Admitting: Family Medicine

## 2023-05-30 VITALS — BP 161/85 | HR 72 | Temp 97.6°F | Resp 16 | Wt 200.6 lb

## 2023-05-30 DIAGNOSIS — K862 Cyst of pancreas: Secondary | ICD-10-CM

## 2023-05-30 DIAGNOSIS — E782 Mixed hyperlipidemia: Secondary | ICD-10-CM

## 2023-05-30 DIAGNOSIS — E669 Obesity, unspecified: Secondary | ICD-10-CM

## 2023-05-30 DIAGNOSIS — I69359 Hemiplegia and hemiparesis following cerebral infarction affecting unspecified side: Secondary | ICD-10-CM

## 2023-05-30 DIAGNOSIS — E1165 Type 2 diabetes mellitus with hyperglycemia: Secondary | ICD-10-CM

## 2023-05-30 DIAGNOSIS — I10 Essential (primary) hypertension: Secondary | ICD-10-CM

## 2023-05-30 DIAGNOSIS — I779 Disorder of arteries and arterioles, unspecified: Secondary | ICD-10-CM

## 2023-05-30 DIAGNOSIS — Z8673 Personal history of transient ischemic attack (TIA), and cerebral infarction without residual deficits: Secondary | ICD-10-CM

## 2023-05-30 DIAGNOSIS — F32A Depression, unspecified: Secondary | ICD-10-CM

## 2023-05-30 DIAGNOSIS — E1142 Type 2 diabetes mellitus with diabetic polyneuropathy: Secondary | ICD-10-CM

## 2023-05-30 LAB — LIPID PANEL
Cholesterol / HDL Ratio: 3.5 Index
Cholesterol: 177 mg/dL (ref <=199)
HDL: 50 mg/dL (ref >=40)
LDL Calculated: 105 mg/dL (ref 0–129)
Triglycerides: 110 mg/dL (ref 34–149)
VLDL Calculated: 22 mg/dL (ref 10–40)

## 2023-05-30 LAB — URINE MICROALBUMIN, RANDOM
Urine Creatinine: 63.7 mg/dL
Urine Microalbumin/Creatinine Ratio: 16 ug/mg (ref <30)
Urine Microalbumin: 10 ug/ml (ref 0.0–30.0)

## 2023-05-30 LAB — COMPREHENSIVE METABOLIC PANEL
ALT: 14 U/L (ref 0–55)
AST (SGOT): 17 U/L (ref 5–41)
Albumin/Globulin Ratio: 1.2 (ref 0.9–2.2)
Albumin: 3.9 g/dL (ref 3.5–5.0)
Alkaline Phosphatase: 100 U/L (ref 37–117)
Anion Gap: 8 (ref 5.0–15.0)
BUN: 19 mg/dL (ref 7–21)
Bilirubin, Total: 0.4 mg/dL (ref 0.2–1.2)
CO2: 25 mEq/L (ref 17–29)
Calcium: 9 mg/dL (ref 8.5–10.5)
Chloride: 105 mEq/L (ref 99–111)
Creatinine: 0.9 mg/dL (ref 0.4–1.0)
GFR: >60 mL/min/{1.73_m2} (ref >=60.0)
Globulin: 3.2 g/dL (ref 2.0–3.6)
Glucose: 175 mg/dL — ABNORMAL HIGH (ref 70–100)
Hemolysis Index: 17 Index
Potassium: 4.4 mEq/L (ref 3.5–5.3)
Protein, Total: 7.1 g/dL (ref 6.0–8.3)
Sodium: 138 mEq/L (ref 135–145)

## 2023-05-30 LAB — HEMOGLOBIN A1C
Average Estimated Glucose: 223.1 mg/dL
Hemoglobin A1C: 9.4 % — ABNORMAL HIGH (ref 4.6–5.6)

## 2023-05-30 MED ORDER — WHEELCHAIR MISC
0 refills | Status: DC
Start: 2023-05-30 — End: 2023-12-05

## 2023-05-30 NOTE — Progress Notes (Signed)
Date: 05/30/2023 9:41 AM   Patient ID: Emily Rowe is a 72 y.o. female.    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 Dr Jerene Pitch  Taking cymbalta 20 mg    Seen urologist  States no issues    Seen opthal -had glaucom Rx Bilat    Since last visit seen cardio  BP med adjusted- spirolactone on -half tab  BP high today- always high in AM- has pain she says    Recurrent GI s/s  Seen GI  No results found.    Seen Recommend neurology consult for further diagnostic intervention. And given cymbalta but not started    12/01/22 mammo Negative    Seen ortho - was told she could get synvisc    Seen opthal - + glaucoma and seeing another MD      DM- seeing Dr Smith Robert   Seen July 2023- A1c 8.9- added jardiance 10 mg- urinating lot, inclding glipizide, insulin 10 lantus- uses only if needed, tradenta    Never started PT as tender at all times to touch    Mood fine, not anxious as before  Seeing therapist- appt today  Stopped zoloft- " I do not feel I need anymore"    Hypertension-chronic  She feels her blood pressure has been stable at home  Denies having any headache, chest pain shortness of breath    Hyperlipidemia-seeing cardiology and getting Repatha- got allergy but repeated Repatha as above             The other 14 point review systems are negative except those in HPI.    Current  Medications:  Outpatient Medications Marked as Taking for the 05/30/23 encounter (Office Visit) with Rebecca Eaton, MD   Medication Sig Dispense Refill    aspirin 81 MG chewable tablet Chew 1 tablet (81 mg) by mouth daily      Cymbalta 20 MG capsule Take 1 capsule (20 mg) by mouth daily      empagliflozin (Jardiance) 10 MG tablet TAKE 1 TABLET(10 MG) BY MOUTH DAILY 90 tablet 0    glipiZIDE (GLUCOTROL) 10 MG 24 hr tablet Take 1 tablet (10 mg) by mouth 2 (two) times daily 180 tablet 0    glucose blood test strip Check sugar three times a day, ICD 10 E11.9 100 each 10    hydrocortisone 2.5 % cream as needed      insulin glargine (LANTUS SOLOSTAR) 100 UNIT/ML injection pen Inject 10 units into skin daily at bedtime ONLY AS NEEDED IF BLOOD SUGAR >OR = TO 170 LAST TAKEN 07/2020 15 mL 1    LORazepam (ATIVAN) 0.5 MG tablet Take 0.5 tablets (0.25 mg) by mouth nightly as needed for  Anxiety 20 tablet 0    losartan (COZAAR) 50 MG tablet Take 1 tablet (50 mg) by mouth 2 (two) times daily 60 tablet 3    omeprazole (PriLOSEC) 20 MG capsule 1 capsule (20 mg) as needed      simvastatin (ZOCOR) 20 MG tablet Take 1 tablet (20 mg) by mouth nightly One tab po qd      verapamil (VERELAN) 180 MG 24 hr ER capsule Take 1 capsule (180 mg) by mouth daily 90 capsule 1    vitamin D, ergocalciferol, (DRISDOL) 50000 UNIT Cap TAKE 1 CAPSULE BY MOUTH 1 TIME A WEEK 12 capsule 1       Past Medical History:  Past Medical History:   Diagnosis Date    Anxiety     Asthma     allergic reaction to mold, not since 20 years, HAS WHEEZING IN COLD WEATHER. NO INHALERS    Bowel perforation     COLONOSCOPY 09/2019    Depression     Diabetes     Difficulty walking     WEAK RIGHT LEG AFTER CVA 2021    Dysphagia     BIG PILLS ONLY     Gastroesophageal reflux disease     PREVACID OTC PRN     Hyperlipemia     Hypertensive disorder     Hypokalemia     CMP REQ PCP 09/14/20    Neuropathy     NUMBNESS RIGHT UE AND LE     Post-operative nausea and vomiting      ALWAYS WITH ANESTHESIA     Sleep apnea 2017    NO CPAP NEEDED PER PT     Stroke 2012     ARM, LEG WEAK. WALKS WITH CANE, WALKER    TIA (transient ischemic attack)     MULTIPLE BEFORE CVA 2012    Type 2 diabetes mellitus, controlled 2021    MANAGED BY DR Binnie Rail RAO. AM FSB 117-140. TAKES LANTUS PRN IN AM. HAIC 7.9       Past Surgical History:  Past Surgical History:   Procedure Laterality Date    BREAST BIOPSY Left     benign lump biopsy    COLONOSCOPY, DIAGNOSTIC (SCREENING)      X 2    COLONOSCOPY, DIAGNOSTIC (SCREENING) N/A 09/17/2020    Procedure: COLONOSCOPY;  Surgeon: Edrick Oh, MD;  Location: [facility];  Service: Gastroenterology;  Laterality: N/A;    EGD N/A 10/28/2020    Procedure: EGD w/ bx's;  Surgeon: Philbert Riser, MD;  Location:  ENDOSCOPY OR;  Service: Gastroenterology;  Laterality: N/A;    HYSTERECTOMY      2005    OOPHORECTOMY Bilateral     2005    REDUCTION MAMMAPLASTY      TONSILLECTOMY      urinary bladder      BLADDER LIFT        Family History:  Family History   Problem Relation Age of Onset    Heart attack Mother     Diabetes Father     Diabetes Maternal Grandmother     Stroke Maternal Grandmother     Stroke Maternal Grandfather     Breast cancer Daughter 30    Colon cancer Neg Hx        Social History:   reports that she has never smoked. She has never been exposed to tobacco smoke. She has never used smokeless tobacco. She reports that she does not drink alcohol and does not use  drugs.    Allergies:  Allergies   Allergen Reactions    Codeine Nausea And Vomiting    Crestor [Rosuvastatin] Rash     D/c due to severe rash and higher blood sugars    Glucosamine Hives and Nausea And Vomiting    Latex Rash    Tree Nuts Hives     ESPECIALLY ALMONDS     Dairy [Milk-Related Compounds]     Egg-Derived Products      Patient had an allergy test and stated that she was allergic to eggs    Gabapentin      Dizziness and rash, irregular heart rate    Gluten Meal     Invokana  [Canagliflozin]     Levemir [Insulin Detemir] Nausea And Vomiting    Lyrica [Pregabalin]      PT STATES IT MAKES HER NEUROPATHY WORSE STATES SHE FEELS LIKE SHE IS ON FIRE    Metformin      diarrhea    Repatha [Evolocumab] Headaches    Sucralfate Nausea And Vomiting     BODY ACHES, FEVER    Evaristo Bury [Insulin Degludec]     Victoza [Liraglutide] Nausea And Vomiting    Flaxseed [Flaxseed (Linseed)] Rash    Omega 3 [Fish Oil] Rash    Peanut-Containing Drug Products Rash        The following sections were reviewed this encounter by the provider:   Tobacco  Allergies  Meds  Problems  Med Hx  Surg Hx  Fam Hx         VITALS:  Vitals:    05/30/23 0837   BP: 161/85   BP Site: Left arm   Patient Position: Sitting   Cuff Size: Large   Pulse: 72   Resp: 16   Temp: 97.6 F (36.4 C)   TempSrc: Oral   SpO2: 96%   Weight: 91 kg (200 lb 9.9 oz)        ROS:  General/Constitutional:   Denies Chills. Denies Fatigue. Denies Fever.   Ophthalmologic:   Denies Blurred vision. Denies Eye Pain.   ENT:   Denies Nasal Discharge. Denies Ear pain. Denies Sinus pain.   Endocrine:   Denies Polydipsia. Denies Polyuria.   Respiratory:   Denies Cough. Denies Orthopnea. Denies Shortness of breath. Denies Wheezing.   Cardiovascular:   Denies Chest pain. Denies Chest pain with exertion. Denies Leg Claudication. Denies      Palpitations. Denies Swelling in hands/feet.   Gastrointestinal:   Denies Abdominal pain. Denies Blood in stool. Denies Constipation. Denies Diarrhea.      Denies Heartburn. Denies Nausea. Denies Vomiting.   Genitourinary:   Denies Blood in urine. Denies Frequent urination. Denies Painful urination.   Musculoskeletal:   + Leg cramps. + Muscle aches.   Skin:   Denies Skin lesion(s).   Neurologic:   Denies Dizziness. +Gait abnormality. Denies Headache. +  Tingling/Numbness.      Physical Exam:  Alert and oriented. Affect is normal and appropriate. Clear thought process with normal reasoning and conversational tone and logic. No  psychomotor retardation. Not in any acute distress  Skin: no rash or abnormal lesions  Eyes - normal eye movements.  Pupils equal and reactive to light  Eyelid normal on exam  Ears - TM clear, external ear normal  Nose - no acute changes  Throat - Oral mucosa moist and pink.  Neck- LEFT SIDE neck thyroid nodule, nontender, no palpable cervical lymph nodes  HEART: S1S2 heard, regular rate and rhythm  LUNGS: good air exchange, no crackles or ronchi or wheezing  ABDOMEN: Positive bowel sounds, no hepatospleenomegaly appreciated, no tenderness.  She has some diffuse discomfort hard to appreciate any Murphy sign.  Back: Normal curvature, no tenderness.   Extremities: FROM, no deformities, no edema, no erythema.   No knee effusion.   mild tendernesson the joint line.  Neuro: Physiological, speech better than before.   Right side weakness.   Restricted and only able to abduct <20%, Right leg weakness.   Strength RUE and RLE 3/5.        Psych-good eye contact  Train of thought normal  Judgment normal      Lab Results   Component Value Date    WBC 6.25 11/17/2022    HGB 13.6 11/17/2022    HCT 43.2 11/17/2022    PLT 151 11/17/2022    CHOL 239 (H) 02/27/2023    TRIG 112 02/27/2023    HDL 49 02/27/2023    LDL 168 (H) 02/27/2023    ALT 19 02/27/2023    AST 20 02/27/2023    NA 141 02/27/2023    K 4.6 02/27/2023    CL 104 02/27/2023    CREAT 0.9 02/27/2023    BUN 19.0 02/27/2023    CO2 29 02/27/2023    TSH 2.10 11/17/2022    INR 1.0 07/14/2021    GLU 199 (H) 02/27/2023    HGBA1C 9.2 (H) 02/27/2023         ASSESSMENT AND PLAN:  1. Uncontrolled type 2 diabetes mellitus with hyperglycemia    2. Diabetic peripheral neuropathy  - Comprehensive Metabolic Panel; Future  - Lipid Panel; Future  - Hemoglobin A1C; Future  - Urine Microalbumin, Random; Future  - Misc. Devices Furniture conservator/restorer) Misc; Use as directed  Dispense: 1 each; Refill: 0  - Comprehensive Metabolic Panel  - Lipid Panel  - Hemoglobin A1C  - Urine Microalbumin, Random    3.  Essential hypertension    4. Bilateral carotid artery disease, unspecified type    5. Mixed hyperlipidemia    6. Pancreas cyst    7. Depression, unspecified depression type    8. Obesity (BMI 30-39.9)  - Comprehensive Metabolic Panel; Future  - Lipid Panel; Future  - Hemoglobin A1C; Future  - Urine Microalbumin, Random; Future  - Misc. Devices Furniture conservator/restorer) Misc; Use as directed  Dispense: 1 each; Refill: 0  - Comprehensive Metabolic Panel  - Lipid Panel  - Hemoglobin A1C  - Urine Microalbumin, Random    9. History of hemorrhagic stroke with residual hemiparesis  - Comprehensive Metabolic Panel; Future  - Lipid Panel; Future  - Hemoglobin A1C; Future  - Urine Microalbumin, Random; Future  - Misc. Devices Furniture conservator/restorer) Misc; Use as directed  Dispense: 1 each; Refill: 0  - Comprehensive Metabolic Panel  - Lipid Panel  - Hemoglobin A1C  - Urine Microalbumin, Random    10. History of CVA (cerebrovascular accident)  - Comprehensive Metabolic Panel; Future  - Lipid Panel; Future  - Hemoglobin A1C; Future  - Urine Microalbumin, Random; Future  - Misc. Devices Furniture conservator/restorer) Misc; Use as directed  Dispense: 1 each; Refill: 0  - Comprehensive Metabolic Panel  - Lipid Panel  - Hemoglobin A1C  - Urine Microalbumin, Random      S/p cholecystectomy with improvement in her GI signs and symptoms  Should continue follow-up with gastroenterologist regarding her pancreatic 6    Patient experiencing significant muscle weakness since her stroke and body pain  Continue the Cymbalta which  should help with her anxiety and pain  Follow-up with neurology  Patient did not tolerate any statin but currently to retrying her Repatha with certain improvement in symptoms but still had severe pain after  The symptoms are improving  Should follow-up with her cardiologist  Recheck levels today    Given her extreme weakness patient is having inability to move but she states she is able to ambulate but she has significant pain  Previously been evaluated  with rheumatology for fibromyalgia like signs and symptoms but nothing came out of it  Patient states this is making her ability to walk quite challenging and would like to have a wheelchair  We had a long conversation about patient getting the wheelchair measurements and finding the appropriate wheelchair for her by working with the physical therapist so she can have the order with specific measurements  Patient states that physical therapist aware of no help and she does not want to go through it and would like to get an order for it  I did specific to patient the risk of getting a wheelchair with proper measurement including falls and injuries.  I did give an order for her wheelchair-patient states that she is going to take it to a nearby [facility] and work with that  I did mention patient I have a feeling that she is going to come right back to me and it has to go through the physical therapist to get the appropriate wheelchair.  Patient voiced understanding      Diabetes-poorly controlled with an A1c of 9.2  Under the care of endocrinologist Dr. Binnie Rail Rao-continue to follow care with Endo-has an appointment coming up soon  Patient currently to continue her insulin, sulfonylurea and Jardiance  She does not have any signs and symptoms of hypoglycemia  She is up-to-date on ophthalmic eye exam  Status post glaucoma surgery  Denies having any foot problem  Follows up with podiatrist Dr. Rito Ehrlich    Uncontrolled blood pressure-she is at very high risk for intracranial bleed  She has not taken her medication as she usually comes fasting  She has been advised to go home and take her medications and monitor her blood pressure closely  She is currently on verapamil, losartan and recently spironolactone  Risks of high blood pressure discussed in detail     Anxiety-at this time she has been started again on Cymbalta by her neuro  She has tried multiple medication including Cymbalta previously with me but never  stayed on it  I strongly recommended that she continue taking this medication    Significant body aches  Rheumatological evaluation did not show any findings suggestive of lupus according to patient  She is going to give [place] the name of the rheumatologist she saw so we can get a consult note  Importance of following up with the physical therapist and having physical therapy to improve her pain discussed  Patient voiced understanding    Panc cyst - being monitored  She is under the care of GI Dr. Philbert Riser office        FOLLOW-UP:  Return in about 3 months (around 08/30/2023) for Diabetes.      This note was generated by the Epic EMR system/ Dragon speech recognition and may contain inherent errors or omissions not intended by the user. Grammatical errors, random word insertions, deletions, pronoun errors and incomplete sentences are occasional consequences of this technology due to software limitations. Not all errors are caught  or corrected. If there are questions or concerns about the content of this note or information contained within the body of this dictation they should be addressed directly with the author for clarification      Kasch Borquez Bernadene Person, MD

## 2023-06-02 ENCOUNTER — Telehealth (INDEPENDENT_AMBULATORY_CARE_PROVIDER_SITE_OTHER): Payer: Self-pay

## 2023-06-02 DIAGNOSIS — G629 Polyneuropathy, unspecified: Secondary | ICD-10-CM

## 2023-06-02 DIAGNOSIS — I69359 Hemiplegia and hemiparesis following cerebral infarction affecting unspecified side: Secondary | ICD-10-CM

## 2023-06-02 DIAGNOSIS — I639 Cerebral infarction, unspecified: Secondary | ICD-10-CM

## 2023-06-02 DIAGNOSIS — M797 Fibromyalgia: Secondary | ICD-10-CM

## 2023-06-02 DIAGNOSIS — Z8673 Personal history of transient ischemic attack (TIA), and cerebral infarction without residual deficits: Secondary | ICD-10-CM

## 2023-06-02 NOTE — Telephone Encounter (Signed)
PT referral placed.

## 2023-06-02 NOTE — Telephone Encounter (Signed)
Received a Mobility Exam Form via fax that needs to be completed for the patient. This exam is typically done through Physical Therapy, but the patient indicates she is not currently undergoing Physical Therapy. She has requested if a referral can be placed specifically for the Mobility Exam so she can obtain a Powered Mobility Device.    Please let me know once the referral is placed, and I will fax the form to be completed by them.

## 2023-06-02 NOTE — Telephone Encounter (Signed)
Referral and mobility exam form have been faxed to [facility] @ [phone]

## 2023-06-13 ENCOUNTER — Encounter (INDEPENDENT_AMBULATORY_CARE_PROVIDER_SITE_OTHER): Payer: Self-pay | Admitting: Family Medicine

## 2023-06-16 IMAGING — MR RM - Torax Sem Contraste
12 series · 16 of 16 positions shown · non-contrast
Comparison: none

[Series 1: loc · axial · 8.0mm · 0.94mm/px · 1 of 26 slices shown (1 of 3)]
[im 1/26]
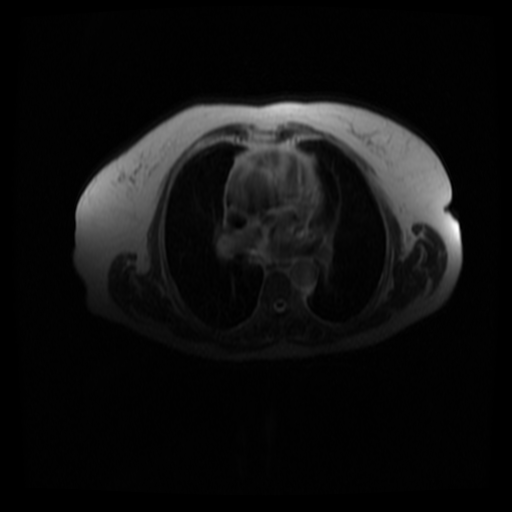

[Series 2: loc · axial · 8.0mm · 0.94mm/px · 1 of 26 slices shown (2 of 3)]
[im 1/26]
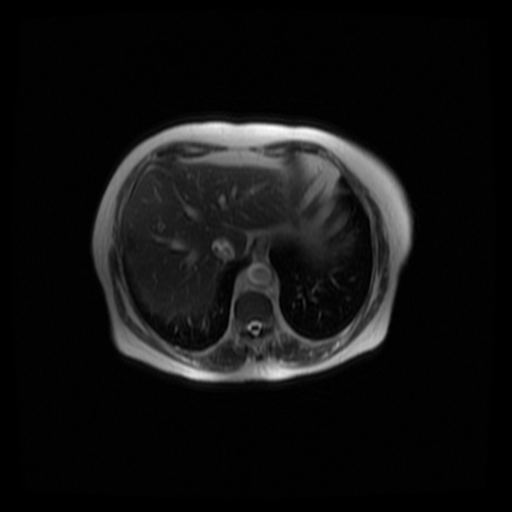

[Series 4: T2 · axial · 6.0mm · 0.83mm/px · 1 of 40 slices shown (1 of 3)]
[im 1/40]
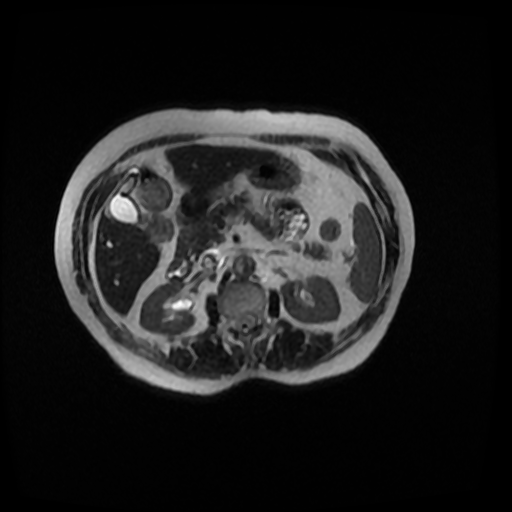

[Series 5: bSSFP fat-sat · axial · 6.0mm · 0.82mm/px · 1 of 40 slices shown (1 of 2)]
[im 1/40]
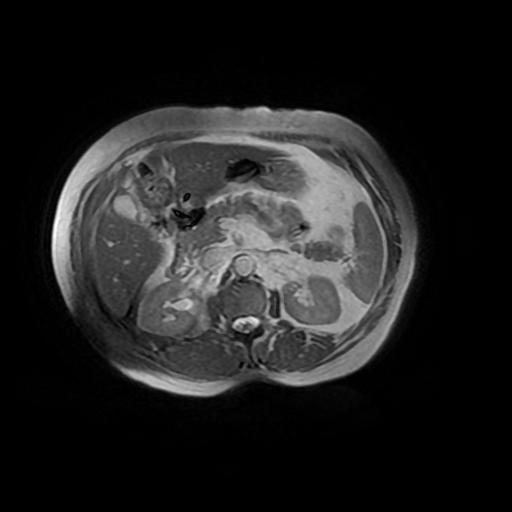

[Series 6: T2 · coronal · 5.4mm · 0.83mm/px · 1 of 60 slices shown (2 of 3)]
[im 1/60]
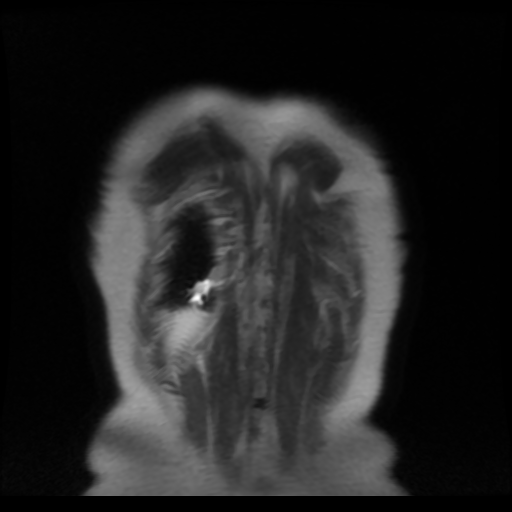

[Series 7: T2 · sagittal · 6.0mm · 0.82mm/px · 1 of 42 slices shown (3 of 3)]
[im 1/42]
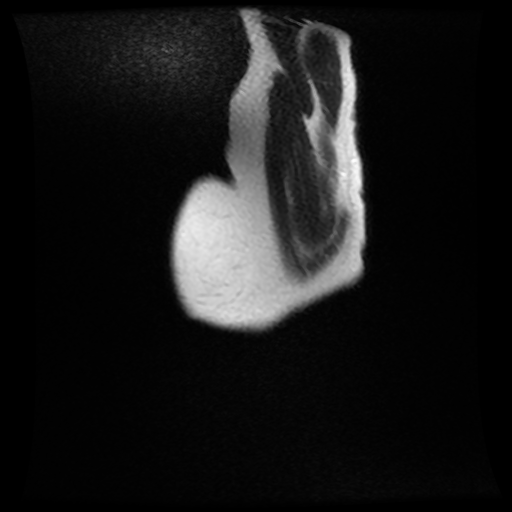

[Series 8: bSSFP fat-sat · sagittal · 6.0mm · 0.82mm/px · 1 of 40 slices shown (2 of 2)]
[im 1/40]
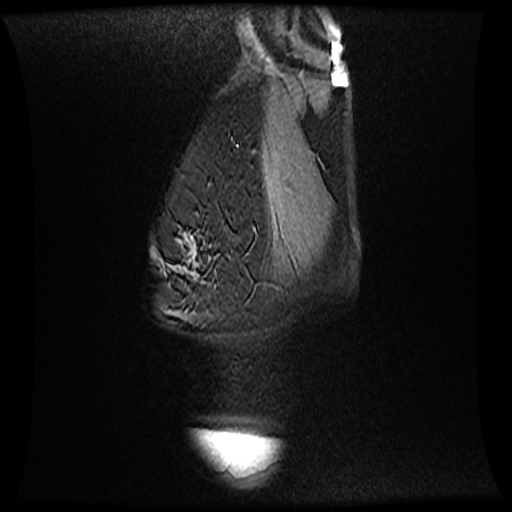

[Series 9: in_out_phase_ax 2d dual · axial · 5.5mm · 0.74mm/px · 1 of 80 slices shown]
[im 1/80]
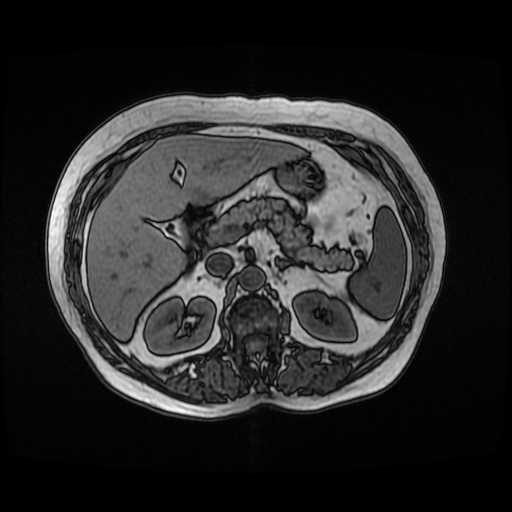

[Series 10: T1 dynamic · axial · 5.0mm · 0.82mm/px · z∈[-106,+161]mm · 2 of 108 slices shown (1 of 2)]
[im 1/108]
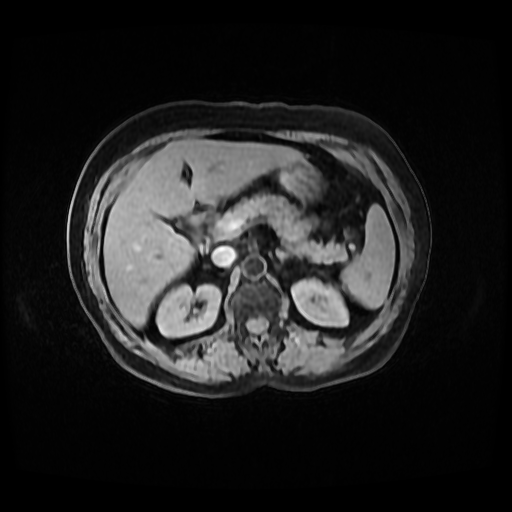
[im 108/108]
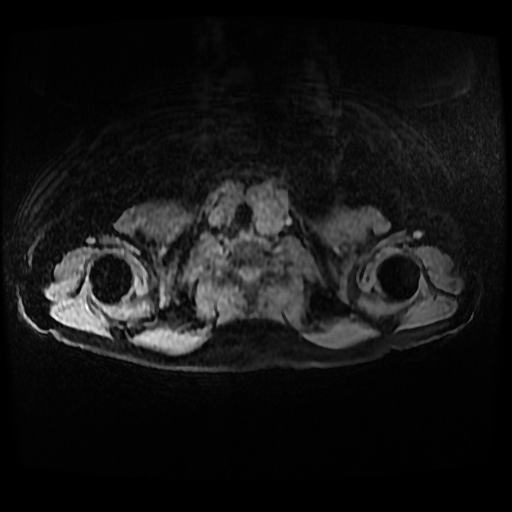

[Series 11: loc · axial · 8.0mm · 0.94mm/px · 1 of 26 slices shown (3 of 3)]
[im 1/26]
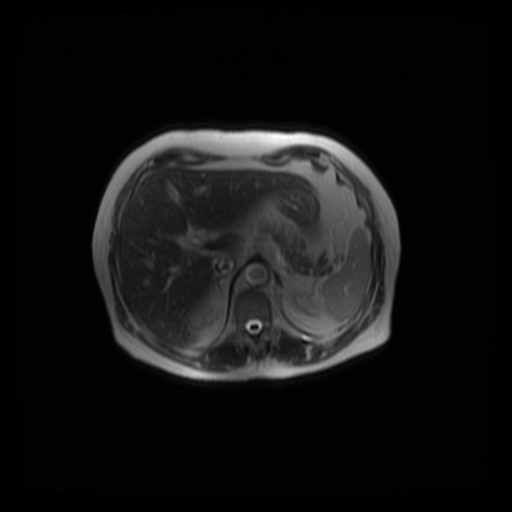

[Series 12: DWI · axial · 5.0mm · 1.60mm/px · z∈[-99,+155]mm · 3 of 160 slices shown]
[im 1/160]
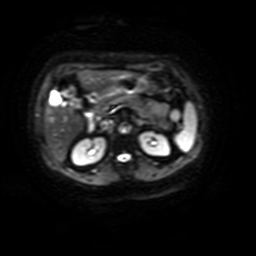
[im 80/160]
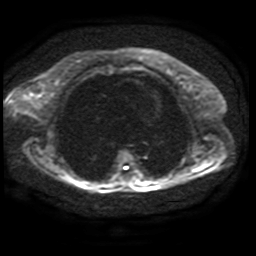
[im 160/160]
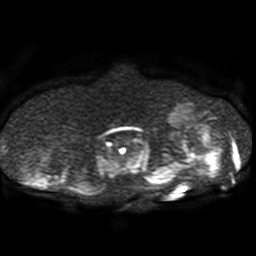

[Series 1000: T1 dynamic · axial · 5.0mm · 0.82mm/px · z∈[-106,+161]mm · 2 of 108 slices shown (2 of 2)]
[im 1/108]
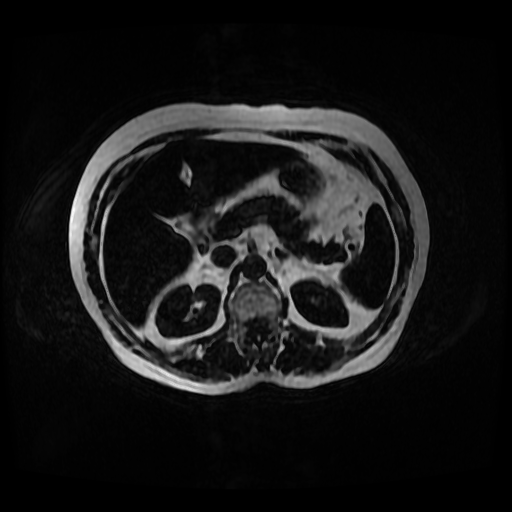
[im 108/108]
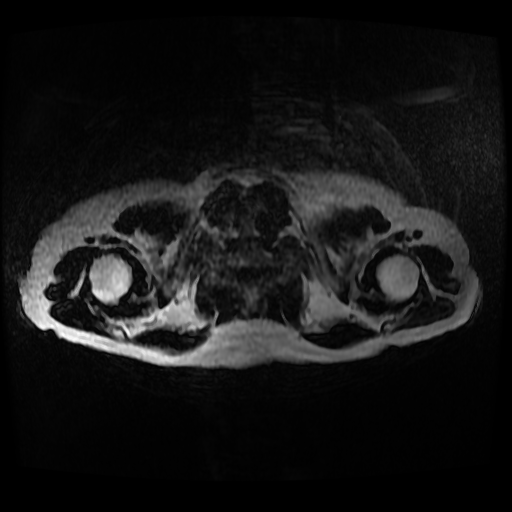

[16 of 16 positions shown; findings below may reference images not displayed]

RESSONÂNCIA MAGNÉTICA DO TÓRAX

TÉCNICA:
Exame realizado em equipamento de ressonância magnética com sequências, ponderações e planos específicos para o segmento de interesse, sem a administração endovenosa do meio de contraste.

RESULTADO:
Espondilose torácica.
Ausência de lesões expansivas, inclusive no mediastino anterior em topografia do timo.
Não se observam linfonodomegalias mediastinais, supraclaviculares ou axilares. 
Parênquima pulmonar com intensidade de sinal habitual.
Não há evidências de derrames ou outras alterações pleurais.
Estruturas cardiovasculares mediastinais com morfologia e topografia normais.
Tireoide apresentando intensidade de sinal heterogêneo, notando-se formação nodular heterogênea, de contornos lobulados, com predomínio de isossinal em T1 e hipersinal em T2, medindo 2,5 x 2,3 cm no lobo esquerdo, inespecífica.

CONCLUSÃO:
Espondilose torácica.
Tireoide apresentando intensidade de sinal heterogêneo, notando-se formação nodular heterogênea, de contornos lobulados, no lobo esquerdo, inespecífica.

## 2023-06-20 ENCOUNTER — Telehealth (INDEPENDENT_AMBULATORY_CARE_PROVIDER_SITE_OTHER): Payer: Self-pay

## 2023-06-20 ENCOUNTER — Ambulatory Visit (INDEPENDENT_AMBULATORY_CARE_PROVIDER_SITE_OTHER): Payer: Medicare Other | Admitting: Orthopaedic Surgery

## 2023-06-20 ENCOUNTER — Encounter (INDEPENDENT_AMBULATORY_CARE_PROVIDER_SITE_OTHER): Payer: Self-pay | Admitting: Family Medicine

## 2023-06-20 ENCOUNTER — Ambulatory Visit (INDEPENDENT_AMBULATORY_CARE_PROVIDER_SITE_OTHER): Payer: Medicare Other

## 2023-06-20 VITALS — BP 159/94 | HR 79

## 2023-06-20 DIAGNOSIS — M79642 Pain in left hand: Secondary | ICD-10-CM

## 2023-06-20 DIAGNOSIS — M25562 Pain in left knee: Secondary | ICD-10-CM

## 2023-06-20 DIAGNOSIS — M1711 Unilateral primary osteoarthritis, right knee: Secondary | ICD-10-CM

## 2023-06-20 DIAGNOSIS — M1710 Unilateral primary osteoarthritis, unspecified knee: Secondary | ICD-10-CM

## 2023-06-20 NOTE — Progress Notes (Signed)
[facility]    Provider: Hermelinda Dellen, MD  Date of Exam:  06/20/2023   Patient:  Emily Rowe  DOB:  07-08-1951    AGE:  72 y.o.  MR#:  [id number]     Emily Rowe is a pleasant 72 y.o.-year-old female who presents today.     History of Present Illness  The patient, with a history of a prior stroke resulting in right-sided weakness, presents with significant right knee pain, described as 'the worst.' The pain, initially localized to the anterior aspect of the knee, has now generalized to encompass the entire joint, both anteriorly and posteriorly. The patient reports that the pain is exacerbated by the weakness and altered gait due to the stroke. The patient also reports pain in the right arm, which she attributes to the stroke, and describes a sensation of 'having no hands' at times.    The patient has a history of diabetes and has previously received cortisone injections for knee pain, which reportedly resulted in significant illness. The patient is considering a wheelchair due to the severity of the knee pain and associated mobility issues.    In addition to the knee pain, the patient also reports significant pain in the right hand, particularly at night. The pain is so severe that it prompts the patient to seek consultation with a hand surgeon.       [name] is now here for further evaluation and discussion of treatment options.    For other past medical history, social history, family history and past surgical history please see them listed below.    Problem List: Problem List[1]     Past Medical History: Medical History[2]    Social History: Social History[3]    Family History:   Family History   Problem Relation Age of Onset    Heart attack Mother     Diabetes Father     Diabetes Maternal Grandmother     Stroke Maternal Grandmother     Stroke Maternal Grandfather     Breast cancer Daughter 30    Colon cancer Neg Hx        Past Surgical History:   Past Surgical History:   Procedure Laterality Date     BREAST BIOPSY Left     benign lump biopsy    COLONOSCOPY, DIAGNOSTIC (SCREENING)      X 2    COLONOSCOPY, DIAGNOSTIC (SCREENING) N/A 09/17/2020    Procedure: COLONOSCOPY;  Surgeon: Edrick Oh, MD;  Location: [facility];  Service: Gastroenterology;  Laterality: N/A;    EGD N/A 10/28/2020    Procedure: EGD w/ bx's;  Surgeon: Philbert Riser, MD;  Location: [facility];  Service: Gastroenterology;  Laterality: N/A;    HYSTERECTOMY      2005    OOPHORECTOMY Bilateral     2005    REDUCTION MAMMAPLASTY      TONSILLECTOMY      urinary bladder      BLADDER LIFT        Medications: Current Medications[4]    Allergies: Allergies[5]    ROS:  Constitutional: No fatigue, fever, weight loss, or weight gain.   Ears, Nose, Mouth & Throat: No sore throat or hearing loss.   Cardiovascular: No chest pain, blood clots, or leg cramps.   Respiratory: No shortness of breath, cough, or difficulty breathing.   Gastrointestinal: No nausea, vomiting, diarrhea, or loss of appetite.   Genitourinary: No polyuria or kidney disease.   Musculoskeletal: No  joint aches, muscle weakness or swelling of joints/body parts other than that mentioned above/below.  Integumentary: No finger nail changes or skin dryness.   Neurological: No numbness, burning discomfort, or headaches.   Psychiatric: No depression or anxiety.   Endocrine: No increased thirst, change in appetite or thyroid disease.   Hematologic/Lymphatic: No easy bruising or anemia.     Vitals: BP (!) 159/94 (BP Site: Left arm, Patient Position: Sitting, Cuff Size: Medium)   Pulse 79     General: Smantha was awake, alert, oriented, easily engaged, displayed logical thinking with clear speech and was neat in appearance. Her general appearance was normal, well-developed and well-nourished.    Physical Exam  MUSCULOSKELETAL: Right knee pain on full extension and flexion to approximately 125 degrees with pain. Lateral and medial joint lines tender.     Skin intact  Erythema:  None present  Swelling: None present  Deformities: None  Patellofemoral Crepitus: Negative  Patellar Apprehension at 30 deg: Negative  J Sign: Negative  Strength: WNL  Other areas of Tenderness: None  DTR and Pathological Reflexes Intact  No lymphadenopathy  Sensation Intact to light touch all distributions of leg and foot  DF, PF, EHL motor intact  Foot Perfused with CR <2 sec  DP/PT pulse 2+    Results  RADIOLOGY  Knee X-ray: Bilateral knee osteoarthritis, right worse than left   Weightbearing AP, lateral, PA flexed and sunrise views of the right and left knees were ordered and reviewed on today's visit.  Joint space is narrowed in the all compartments. There are Mild marginal osteophytes of the patellofemoral joint lines. There are no sclerotic and cystic changes. There are no acute bony abnormalities. The patellae are centralized in femoral sulci.      Labs reviewed  Assessment & Plan  Right Knee Osteoarthritis  Severe pain and weakness, exacerbated by prior stroke causing right-sided weakness. X-rays confirm arthritis. Cortisone injections contraindicated due to diabetes and previous adverse reaction.  -Refer to Dr. Allena Katz or Dr. Orvan Falconer for knee replacement consultation.    Left Hand Pain  Severe pain, especially at night. No clear etiology discussed.  -Refer to Dr. Welton Flakes, a hand surgeon at [facility], for further evaluation and management.       I have asked the patient to avoid activities that exacerbate or worsen their symptoms.  We reviewed the standard conservative and surgical management of their diagnosis as well as the use of medications such as Tylenol and OTC NSAIDs (at prescription strength dosing) as well as discussed at length alternative options for prescription based anti-inflammatories to treat the condition.  We discussed the limited role of injection therapy in management and its potential role in diagnosis and short term pain relief.  We have also discussed referral for formal  Physical Therapy. The potential need for surgical intervention was described in detail including the procedure, risks, rehab and recovery.  All questions were answered to the patient's satisfaction.  The patient will notify my clinic of any changes or worsening of their symptoms during the interim.    The review of the patient's medications does not in any way constitute an endorsement, by this clinician,  of their use, dosage, indications, route, efficacy, interactions, or other clinical parameters.    This note may have been generated in part or in whole within the [employer] EMR using Dragon medical speech recognition software and may contain inherent errors or omissions not intended by the user. Grammatical and punctuation errors, random word insertions, deletions,  pronoun errors and incomplete sentences are occasional consequences of this technology due to software limitations. Not all errors are caught or corrected.  Although every attempt is made to root out erroneus and incomplete transcription, the note may still not fully represent the intent or opinion of the author. If there are questions or concerns about the content of this note or information contained within the body of this dictation they should be addressed directly with the author for clarification.    1. Number and Complexity of Problems Addressed: E&M:   Chronic with Exacerbation    2. Data Reviewed: (need A, B, or C)  New Radiology/Labs ordered  Outside Radiology reports/Labs reviewed  External provider notes / PT notes reviewed    3. Risk of Problem/Management: Moderate risk from additional diagnostic testing or treatment   (need 1)  Medication with side effects and interactions discussed  Minor Surgery / Injections with Risk Factors Discussed  Major Surgery without Risk Factors Discussed    Level of Medical Decision Making: Moderate - Level 4 Visit  Hermelinda Dellen M.D., FAAOS,  Orthopaedic Surgery and Sports Medicine         [1]   Patient Active  Problem List  Diagnosis    Uncontrolled type 2 diabetes mellitus with hyperglycemia    Mixed hyperlipidemia    Obesity (BMI 30-39.9)    Neuropathy    Stroke    Essential hypertension    Diabetic peripheral neuropathy    History of hemorrhagic stroke with residual hemiparesis    Pancreas cyst    Breast pain, left    Perforated abdominal viscus    Anemia, unspecified type    Pure hypercholesterolemia, unspecified    Depression, unspecified depression type    Bilateral carotid artery disease, unspecified type    Fibromyalgia muscle pain    Morbid (severe) obesity due to excess calories    History of CVA (cerebrovascular accident)   [2]   Past Medical History:  Diagnosis Date    Anxiety     Asthma     allergic reaction to mold, not since 20 years, HAS WHEEZING IN COLD WEATHER. NO INHALERS    Bowel perforation     COLONOSCOPY 09/2019    Depression     Diabetes     Difficulty walking     WEAK RIGHT LEG AFTER CVA 2021    Dysphagia     BIG PILLS ONLY     Gastroesophageal reflux disease     PREVACID OTC PRN     Hyperlipemia     Hypertensive disorder     Hypokalemia     CMP REQ PCP 09/14/20    Neuropathy     NUMBNESS RIGHT UE AND LE     Post-operative nausea and vomiting     ALWAYS WITH ANESTHESIA     Sleep apnea 2017    NO CPAP NEEDED PER PT     Stroke 2012     ARM, LEG WEAK. WALKS WITH CANE, WALKER    TIA (transient ischemic attack)     MULTIPLE BEFORE CVA 2012    Type 2 diabetes mellitus, controlled 2021    MANAGED BY DR Binnie Rail RAO. AM FSB 117-140. TAKES LANTUS PRN IN AM. HAIC 7.9   [3]   Social History  Tobacco Use    Smoking status: Never     Passive exposure: Never    Smokeless tobacco: Never   Vaping Use    Vaping status: Never Used   Substance Use Topics  Alcohol use: Never    Drug use: Never   [4]   Current Outpatient Medications:     aspirin 81 MG chewable tablet, Chew 1 tablet (81 mg) by mouth daily, Disp: , Rfl:     Cymbalta 20 MG capsule, Take 1 capsule (20 mg) by mouth daily, Disp: , Rfl:     empagliflozin  (Jardiance) 10 MG tablet, TAKE 1 TABLET(10 MG) BY MOUTH DAILY, Disp: 90 tablet, Rfl: 0    glipiZIDE (GLUCOTROL) 10 MG 24 hr tablet, Take 1 tablet (10 mg) by mouth 2 (two) times daily, Disp: 180 tablet, Rfl: 0    glucose blood test strip, Check sugar three times a day, ICD 10 E11.9, Disp: 100 each, Rfl: 10    hydrocortisone 2.5 % cream, as needed, Disp: , Rfl:     insulin glargine (LANTUS SOLOSTAR) 100 UNIT/ML injection pen, Inject 10 units into skin daily at bedtime ONLY AS NEEDED IF BLOOD SUGAR >OR = TO 170 LAST TAKEN 07/2020, Disp: 15 mL, Rfl: 1    LORazepam (ATIVAN) 0.5 MG tablet, Take 0.5 tablets (0.25 mg) by mouth nightly as needed for Anxiety, Disp: 20 tablet, Rfl: 0    losartan (COZAAR) 50 MG tablet, Take 1 tablet (50 mg) by mouth 2 (two) times daily, Disp: 60 tablet, Rfl: 3    Misc. Devices Furniture conservator/restorer) Misc, Use as directed, Disp: 1 each, Rfl: 0    omeprazole (PriLOSEC) 20 MG capsule, 1 capsule (20 mg) as needed, Disp: , Rfl:     simvastatin (ZOCOR) 20 MG tablet, Take 1 tablet (20 mg) by mouth nightly One tab po qd, Disp: , Rfl:     verapamil (VERELAN) 180 MG 24 hr ER capsule, Take 1 capsule (180 mg) by mouth daily, Disp: 90 capsule, Rfl: 1    vitamin D, ergocalciferol, (DRISDOL) 50000 UNIT Cap, TAKE 1 CAPSULE BY MOUTH 1 TIME A WEEK, Disp: 12 capsule, Rfl: 1  [5]   Allergies  Allergen Reactions    Codeine Nausea And Vomiting    Crestor [Rosuvastatin] Rash     D/c due to severe rash and higher blood sugars    Glucosamine Hives and Nausea And Vomiting    Latex Rash    Tree Nuts Hives     ESPECIALLY ALMONDS     Dairy [Milk-Related Compounds]     Egg-Derived Products      Patient had an allergy test and stated that she was allergic to eggs    Gabapentin      Dizziness and rash, irregular heart rate    Gluten Meal     Invokana [Canagliflozin]     Levemir [Insulin Detemir] Nausea And Vomiting    Lyrica [Pregabalin]      PT STATES IT MAKES HER NEUROPATHY WORSE STATES SHE FEELS LIKE SHE IS ON FIRE    Metformin       diarrhea    Repatha [Evolocumab] Headaches    Sucralfate Nausea And Vomiting     BODY ACHES, FEVER    Evaristo Bury [Insulin Degludec]     Victoza [Liraglutide] Nausea And Vomiting    Flaxseed [Flaxseed (Linseed)] Rash    Omega 3 [Fish Oil] Rash    Peanut-Containing Drug Products Rash

## 2023-06-20 NOTE — Telephone Encounter (Signed)
Informed pt of time and location

## 2023-06-21 ENCOUNTER — Ambulatory Visit: Payer: Medicare Other | Admitting: Orthopaedic Surgery

## 2023-06-21 ENCOUNTER — Encounter: Payer: Self-pay | Admitting: Orthopaedic Surgery

## 2023-06-21 VITALS — Ht 66.0 in | Wt 200.0 lb

## 2023-06-21 DIAGNOSIS — G5602 Carpal tunnel syndrome, left upper limb: Secondary | ICD-10-CM

## 2023-06-21 DIAGNOSIS — M25532 Pain in left wrist: Secondary | ICD-10-CM

## 2023-06-21 NOTE — Progress Notes (Signed)
Orthopaedic New Patient Visit    Chief Complaint   Patient presents with    Wrist Pain     Left wrist pain        Subjective:   Emily Rowe is a 72 y.o. female presenting for initial evaluation of  left wrist pain that has been ongoing for 1 year, but recently worsened. She reports pain of the dorsal wrist that extends into the hand and radiates into the forearm. She also has the presence of a cyst on the volar radial aspect of the wrist. She has occasional numbness of her hand and swelling. Her symptoms worsen by the end of the day. Due to a previous stroke that caused her to be paralyzed on her right side as well as stomach upset, she is unable to take certain NSAIDs and pain medications, however she takes Tylenol very rarely. She has not been evaluated previously for her condition.       Review of Systems   Constitutional: Negative.    HENT: Negative.     Eyes: Negative.    Respiratory: Negative.     Cardiovascular: Negative.    Gastrointestinal: Negative.    Genitourinary: Negative.    Musculoskeletal:         SEE HPI   Skin: Negative.    Neurological: Negative.    Endo/Heme/Allergies: Negative.    Psychiatric/Behavioral: Negative.           Vital Signs: Ht 1.676 m (5\' 6" )   Wt 90.7 kg (200 lb)   BMI 32.28 kg/m     Medications: Current Medications[1]  .  Allergies: Codeine, Crestor [rosuvastatin], Glucosamine, Latex, Tree nuts, Dairy [milk-related compounds], Egg-derived products, Gabapentin, Gluten meal, Invokana [canagliflozin], Levemir [insulin detemir], Lyrica [pregabalin], Metformin, Repatha [evolocumab], Sucralfate, Evaristo Bury [insulin degludec], Victoza [liraglutide], Flaxseed [flaxseed (linseed)], Omega 3 [fish oil], and Peanut-containing drug products  Medical History: Medical History[2]  Surgical History:   Past Surgical History:   Procedure Laterality Date    BREAST BIOPSY Left     benign lump biopsy    COLONOSCOPY, DIAGNOSTIC (SCREENING)      X 2    COLONOSCOPY, DIAGNOSTIC (SCREENING) N/A  09/17/2020    Procedure: COLONOSCOPY;  Surgeon: Edrick Oh, MD;  Location: [facility];  Service: Gastroenterology;  Laterality: N/A;    EGD N/A 10/28/2020    Procedure: EGD w/ bx's;  Surgeon: Philbert Riser, MD;  Location: [facility];  Service: Gastroenterology;  Laterality: N/A;    HYSTERECTOMY      2005    OOPHORECTOMY Bilateral     2005    REDUCTION MAMMAPLASTY      TONSILLECTOMY      urinary bladder      BLADDER LIFT      Family History:   Family History   Problem Relation Age of Onset    Heart attack Mother     Diabetes Father     Diabetes Maternal Grandmother     Stroke Maternal Grandmother     Stroke Maternal Grandfather     Breast cancer Daughter 30    Colon cancer Neg Hx      Social History:   Social History     Tobacco Use    Smoking status: Never     Passive exposure: Never    Smokeless tobacco: Never   Substance Use Topics    Alcohol use: Never       Objective:     Patient is a 72 y.o. female, she  is awake, alert, oriented, and is in no acute distress. Patient ambulates to the exam room without assistance.    Left wrist  No open skin lesions, gross deformity, edema, ecchymoses or erythema present  Pain noted at the limit of wrist flexion extension, but not supination and pronation.  No localized tenderness throughout the wrist  Positive Tinel's with significant pain into the hand and forearm  Positive median nerve compression test    Diagnostic Tests/Imaging:     In-office x-rays were obtained today. See findings below:      XR Wrist Left 3+ Views    Result Date: 06/21/2023    3 views of the left wrist show no acute bony abnormality such as fracture or dislocation.  Well-preserved joint spaces throughout.     Assessment:       ICD-10-CM    1. Left wrist pain  M25.532 XR Wrist Left 3+ Views      2. 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  Her exam is a bit complicated given her  neurologic history.  For this reason, EMGs may be beneficial for diagnostic purposes.  We have asked her to discuss this with her neurologist.  For additional diagnostic purposes, we recommended a one-time lidocaine injection.  Unfortunate, the patient cannot tolerate cortisone given her elevated blood sugars.  We have asked the patient to follow-up in about 3 weeks to assess how well the numbing medication has helped.  If she does get relief, it would help determine how much of her pain is coming from the carpal tunnel.  I have also recommended she begin wearing a wrist brace at night.  She was given a handout to purchase this at the local pharmacy.      Follow-up in 3 weeks for repeat exam.    Procedures/Casting/Splinting/DME:     left Carpal Tunnel Injection:  After informed consent, the wrist was cleaned with alcohol prep and injected with  1 mL Plain Lidocaine 2% into the carpal tunnel. Patient tolerated the procedure well. A sterile adhesive bandage was applied to injection site. Post injection instructions were discussed with the patient. Patient departed under her own power.     Evaluation and documentation performed in conjunction with Romero Belling, PA-C.         [1]   Current Outpatient Medications   Medication Sig Dispense Refill    aspirin 81 MG chewable tablet Chew 1 tablet (81 mg) by mouth daily      empagliflozin (Jardiance) 10 MG tablet TAKE 1 TABLET(10 MG) BY MOUTH DAILY 90 tablet 0    glipiZIDE (GLUCOTROL) 10 MG 24 hr tablet Take 1 tablet (10 mg) by mouth 2 (two) times daily 180 tablet 0    verapamil (VERELAN) 180 MG 24 hr ER capsule Take 1 capsule (180 mg) by mouth daily 90 capsule 1    vitamin D, ergocalciferol, (DRISDOL) 50000 UNIT Cap TAKE 1 CAPSULE BY MOUTH 1 TIME A WEEK 12 capsule 1    Cymbalta 20 MG capsule Take 1 capsule (20 mg) by mouth daily      glucose blood test strip Check sugar three times a day, ICD 10 E11.9 100 each 10    hydrocortisone 2.5 % cream as needed      insulin glargine  (LANTUS SOLOSTAR) 100 UNIT/ML injection pen Inject 10 units  into skin daily at bedtime ONLY AS NEEDED IF BLOOD SUGAR >OR = TO 170 LAST TAKEN 07/2020 15 mL 1    LORazepam (ATIVAN) 0.5 MG tablet Take 0.5 tablets (0.25 mg) by mouth nightly as needed for Anxiety 20 tablet 0    losartan (COZAAR) 50 MG tablet Take 1 tablet (50 mg) by mouth 2 (two) times daily 60 tablet 3    Misc. Devices (Wheelchair) Misc Use as directed 1 each 0    omeprazole (PriLOSEC) 20 MG capsule 1 capsule (20 mg) as needed      simvastatin (ZOCOR) 20 MG tablet Take 1 tablet (20 mg) by mouth nightly One tab po qd       No current facility-administered medications for this visit.   [2]   Past Medical History:  Diagnosis Date    Anxiety     Asthma     allergic reaction to mold, not since 20 years, HAS WHEEZING IN COLD WEATHER. NO INHALERS    Bowel perforation     COLONOSCOPY 09/2019    Depression     Diabetes     Difficulty walking     WEAK RIGHT LEG AFTER CVA 2021    Dysphagia     BIG PILLS ONLY     Gastroesophageal reflux disease     PREVACID OTC PRN     Hyperlipemia     Hypertensive disorder     Hypokalemia     CMP REQ PCP 09/14/20    Neuropathy     NUMBNESS RIGHT UE AND LE     Post-operative nausea and vomiting     ALWAYS WITH ANESTHESIA     Sleep apnea 2017    NO CPAP NEEDED PER PT     Stroke 2012     ARM, LEG WEAK. WALKS WITH CANE, WALKER    TIA (transient ischemic attack)     MULTIPLE BEFORE CVA 2012    Type 2 diabetes mellitus, controlled 2021    MANAGED BY DR Binnie Rail RAO. AM FSB 117-140. TAKES LANTUS PRN IN AM. HAIC 7.9

## 2023-06-22 ENCOUNTER — Ambulatory Visit (INDEPENDENT_AMBULATORY_CARE_PROVIDER_SITE_OTHER): Payer: Medicare Other

## 2023-06-22 DIAGNOSIS — E119 Type 2 diabetes mellitus without complications: Secondary | ICD-10-CM

## 2023-06-22 DIAGNOSIS — I10 Essential (primary) hypertension: Secondary | ICD-10-CM

## 2023-06-22 DIAGNOSIS — I6523 Occlusion and stenosis of bilateral carotid arteries: Secondary | ICD-10-CM

## 2023-06-23 ENCOUNTER — Encounter (INDEPENDENT_AMBULATORY_CARE_PROVIDER_SITE_OTHER): Payer: Self-pay

## 2023-06-23 ENCOUNTER — Ambulatory Visit (INDEPENDENT_AMBULATORY_CARE_PROVIDER_SITE_OTHER): Payer: Medicare Other | Admitting: Family Medicine

## 2023-06-23 ENCOUNTER — Encounter (INDEPENDENT_AMBULATORY_CARE_PROVIDER_SITE_OTHER): Payer: Self-pay | Admitting: Student in an Organized Health Care Education/Training Program

## 2023-06-23 ENCOUNTER — Telehealth (INDEPENDENT_AMBULATORY_CARE_PROVIDER_SITE_OTHER): Payer: Self-pay

## 2023-06-23 ENCOUNTER — Ambulatory Visit (INDEPENDENT_AMBULATORY_CARE_PROVIDER_SITE_OTHER): Payer: Medicare Other | Admitting: Student in an Organized Health Care Education/Training Program

## 2023-06-23 VITALS — BP 145/89 | HR 91 | Ht 64.0 in | Wt 202.0 lb

## 2023-06-23 DIAGNOSIS — M170 Bilateral primary osteoarthritis of knee: Secondary | ICD-10-CM

## 2023-06-23 DIAGNOSIS — M1711 Unilateral primary osteoarthritis, right knee: Secondary | ICD-10-CM

## 2023-06-23 DIAGNOSIS — I6523 Occlusion and stenosis of bilateral carotid arteries: Secondary | ICD-10-CM

## 2023-06-23 DIAGNOSIS — M1712 Unilateral primary osteoarthritis, left knee: Secondary | ICD-10-CM

## 2023-06-23 NOTE — Progress Notes (Signed)
Orthopedic Surgery New Patient Clinic Visit    Chief Complaint     Chief Complaint   Patient presents with    Knee Pain     Pt is here for B knee pain x 10 years. Severe pain and weakness, exacerbated by prior stroke causing right-sided weakness. Nov 18, 2010.  Pt states that the pain is constant and " awful " she did have cortisone injections in the past and it helped a little, but she can no longer get them due to her diabetes.         History of Present Illness:     Emily Rowe is a 72 y.o. female who presents today for evaluation of bilateral knee pain, greater than left. Her symptoms have been present for 10 years and have been worsening over time. She notes knee pain that is present globally but worse anteriorly. The pain is constant in nature. The patient also endorses weakness. She denies any groin pain, radiating pain or back pain. No recent injury/trauma. The pain temporarily improves with rest and cortisone injections  and is exacerbated by walking, standing, stairs, and getting in/out of a chair.  Of note, she has a history of prior stroke that has resulted in right-sided weakness.  She has not had cortisone injections in the past but that due to her poorly controlled diabetes these are not advisable any longer.  She presents for evaluation and discussion of treatment options.     Clinical interventions thus far:  HEP: no   PT: no   Medications: yes --  Acetaminophen   Injections: yes -- Corticosteroid Injection  Ambulatory aid: No Aid    Past Hip or Knee surgery: None    Medical Hx: Fibromyalgia, Diabetes (last A1c from 3 weeks ago 9.4), hyperlipidemia, hypertension, neuropathy, prior stroke, GERD, anxiety, asthma  Opioid Use: no current use  Smoking Status: Non-smoker    Past Medical History:   Past Medical History:   Diagnosis Date    Anxiety     Asthma     allergic reaction to mold, not since 20 years, HAS WHEEZING IN COLD WEATHER. NO INHALERS    Bowel perforation     COLONOSCOPY 09/2019     Depression     Diabetes     Difficulty walking     WEAK RIGHT LEG AFTER CVA 2021    Dysphagia     BIG PILLS ONLY     Gastroesophageal reflux disease     PREVACID OTC PRN     Hyperlipemia     Hypertensive disorder     Hypokalemia     CMP REQ PCP 09/14/20    Neuropathy     NUMBNESS RIGHT UE AND LE     Post-operative nausea and vomiting     ALWAYS WITH ANESTHESIA     Sleep apnea 2017    NO CPAP NEEDED PER PT     Stroke 2012     ARM, LEG WEAK. WALKS WITH CANE, WALKER    TIA (transient ischemic attack)     MULTIPLE BEFORE CVA 2012    Type 2 diabetes mellitus, controlled 2021    MANAGED BY DR Binnie Rail RAO. AM FSB 117-140. TAKES LANTUS PRN IN AM. HAIC 7.9       Past Surgical History:   Past Surgical History:   Procedure Laterality Date    BREAST BIOPSY Left     benign lump biopsy    COLONOSCOPY, DIAGNOSTIC (SCREENING)      X 2  COLONOSCOPY, DIAGNOSTIC (SCREENING) N/A 09/17/2020    Procedure: COLONOSCOPY;  Surgeon: Edrick Oh, MD;  Location: [facility];  Service: Gastroenterology;  Laterality: N/A;    EGD N/A 10/28/2020    Procedure: EGD w/ bx's;  Surgeon: Philbert Riser, MD;  Location: [facility];  Service: Gastroenterology;  Laterality: N/A;    HYSTERECTOMY      2005    OOPHORECTOMY Bilateral     2005    REDUCTION MAMMAPLASTY      TONSILLECTOMY      urinary bladder      BLADDER LIFT        Social History:   Social History[1]     Current Medications:  Current Medications[2]    Allergies:   Allergies[3]     Physical Examination:     Vitals (last filed):   BP 145/89 (BP Site: Left arm, Patient Position: Sitting, Cuff Size: X-Large)   Pulse 91   Ht 1.626 m (5\' 4" )   Wt 91.6 kg (202 lb)   BMI 34.67 kg/m     BMI:   Estimated body mass index is 34.67 kg/m as calculated from the following:    Height as of this encounter: 1.626 m (5\' 4" ).    Weight as of this encounter: 91.6 kg (202 lb).    Exam:  General: NAD. Patient is alert and oriented.  CV: Extremity wwp  Resp: Nonlabored breathing, breathing  comfortably on RA    Right Knee  The is no evidence of previous incisions.  There is no effusion.   There is pain free ROM of the ipsilateral hip.   TTP anterior knee    Severe quad weakness  Knee ROM 0 to 100.    There is no varus or valgus instability througout the ROM.     The patient is otherwise NVI     Left Knee  The is no evidence of previous incisions.  There is no effusion.   There is pain free ROM of the ipsilateral hip.   TTP anterior knee    Severe quad weakness  Knee ROM 0 to 100.    There is no varus or valgus instability througout the ROM.     The patient is otherwise NVI     Laboratory / Ancillary Data:   Data Review:  Lab Results   Component Value Date    WBC 6.25 11/17/2022    HGB 13.6 11/17/2022    HCT 43.2 11/17/2022    MCV 88.0 11/17/2022    PLT 151 11/17/2022     Lab Results   Component Value Date    NA 138 05/30/2023    K 4.4 05/30/2023    CO2 25 05/30/2023    CL 105 05/30/2023    BUN 19 05/30/2023     Lab Results   Component Value Date    HGBA1C 9.4 (H) 05/30/2023       Imaging:   New Xrays were obtained in clinic today and reviewed.  AP, lateral, PA flexed and sunrise X-ray views of bilateral knee(s) demonstrate mild joint space narrowing of the medial and patellofemoral compartment with marginal osteophyte formation, consistent with mild osteoarthritic changes. No other acute pathology is noted.   Impression: Kellgren Lawrence Grade 2 osteoarthritis of bilateral knees     Procedures:   None    Assessment / Plan:   Bilateral knee Arthritis    -I had a long discussion with the patient regarding their symptoms, exam findings and x-rays. I  explained to the patient that radiographically she has mild bilateral knee arthritis. We discussed the diagnosis of knee osteoarthritis at length. We discussed the natural history and treatment modalities including weight loss, physical therapy, NSAID medication (both over the counter and prescribed), injection therapies, and ultimately possible surgical  options.  -The severity of her pain does not correlate with the radiographic findings. I believe this is likely from the weakness that she has in her lower extremity as well as her fibromyalgia. We discussed surgery with knee replacement is indicated for severe or bone-on-bone arthritis, which she does not have at this time. I believe that proceeding with a total knee replacement for her knee pain at this time will actually cause worsening of the pain and function as she will have an extremely difficult time with postop recovery  -I think she will benefit tremendously from a course of physical therapy and right lower extremity strengthening  -Advised the patient to start a home stationary biking program to help with quad strengthening  -Additionally patient has poorly controlled diabetes.  Her hemoglobin A1c from 05/30/2023 was 9.4.  I explained to the patient that in the future if her arthritis does worsen and she requires surgical intervention she would be at an extremely high risk of perioperative complications as well as PJI due to her poorly controlled diabetes.  Additionally, surgical intervention will be postponed until she can improve her glycemic control and bring her hemoglobin A1c down to less than 8.  -Due to her diabetes I believe she is also at a very high risk of complications following any form of injection therapy as well, especially with cortisone injections  -Ice, topical voltaren, and Tylenol for pain relief PRN; patient is unable to take NSAIDs due to GERD  -All questions and concerns were answered in the office today and they are to call the office in the interim if they have any questions.      ---------------------------------------------------------------------------------------------------------------  The review of the patient's medications does not in any way constitute an endorsement, by this clinician, of their use, dosage, indications, route, efficacy, interactions, or other clinical  parameters.    This note was generated within the EPIC EMR using Dragon medical speech recognition software and may contain inherent errors or omissions not intended by the user. Grammatical and punctuation errors, random word insertions, deletions, pronoun errors and incomplete sentences are occasional consequences of this technology due to software limitations. Not all errors are caught or corrected.  Although every attempt is made to root out erroneus and incomplete transcription, the note may still not fully represent the intent or opinion of the author. If there are questions or concerns about the content of this note or information contained within the body of this dictation they should be addressed directly with the author for clarification.    Attestation:     I have seen and personally examined the patient, reviewed and personally interpreted the images pertinent for treatment decision, and am the responsible party for the plan and clinical decisions.  One or more parties may have aided in the documentation of this patient, and I have personally reviewed this work and attest it to be true to the best of my knowledge.    Signed:  Margo Aye, MD  2:35 PM    ------------------------------------------------------------------------------------------         [1]   Social History  Tobacco Use    Smoking status: Never     Passive exposure: Never  Smokeless tobacco: Never   Vaping Use    Vaping status: Never Used   Substance Use Topics    Alcohol use: Never    Drug use: Never   [2]   Current Outpatient Medications   Medication Sig Dispense Refill    aspirin 81 MG chewable tablet Chew 1 tablet (81 mg) by mouth daily      Cymbalta 20 MG capsule Take 1 capsule (20 mg) by mouth daily      empagliflozin (Jardiance) 10 MG tablet TAKE 1 TABLET(10 MG) BY MOUTH DAILY 90 tablet 0    glipiZIDE (GLUCOTROL) 10 MG 24 hr tablet Take 1 tablet (10 mg) by mouth 2 (two) times daily 180 tablet 0    glucose blood test strip Check  sugar three times a day, ICD 10 E11.9 100 each 10    hydrocortisone 2.5 % cream as needed      insulin glargine (LANTUS SOLOSTAR) 100 UNIT/ML injection pen Inject 10 units into skin daily at bedtime ONLY AS NEEDED IF BLOOD SUGAR >OR = TO 170 LAST TAKEN 07/2020 15 mL 1    LORazepam (ATIVAN) 0.5 MG tablet Take 0.5 tablets (0.25 mg) by mouth nightly as needed for Anxiety 20 tablet 0    losartan (COZAAR) 50 MG tablet Take 1 tablet (50 mg) by mouth 2 (two) times daily 60 tablet 3    Misc. Devices Furniture conservator/restorer) Misc Use as directed 1 each 0    omeprazole (PriLOSEC) 20 MG capsule 1 capsule (20 mg) as needed      simvastatin (ZOCOR) 20 MG tablet Take 1 tablet (20 mg) by mouth nightly One tab po qd      verapamil (VERELAN) 180 MG 24 hr ER capsule Take 1 capsule (180 mg) by mouth daily 90 capsule 1    vitamin D, ergocalciferol, (DRISDOL) 50000 UNIT Cap TAKE 1 CAPSULE BY MOUTH 1 TIME A WEEK 12 capsule 1     No current facility-administered medications for this visit.   [3]   Allergies  Allergen Reactions    Codeine Nausea And Vomiting    Crestor [Rosuvastatin] Rash     D/c due to severe rash and higher blood sugars    Glucosamine Hives and Nausea And Vomiting    Latex Rash    Tree Nuts Hives     ESPECIALLY ALMONDS     Almond Oil     Coconut (Cocos Nucifera)     Dairy [Milk-Related Compounds]     Egg-Derived Products      Patient had an allergy test and stated that she was allergic to eggs    Gabapentin      Dizziness and rash, irregular heart rate    Gluten Meal     Invokana [Canagliflozin]     Levemir [Insulin Detemir] Nausea And Vomiting    Lyrica [Pregabalin]      PT STATES IT MAKES HER NEUROPATHY WORSE STATES SHE FEELS LIKE SHE IS ON FIRE    Metformin      diarrhea    Repatha [Evolocumab] Headaches    Sucralfate Nausea And Vomiting     BODY ACHES, FEVER    Evaristo Bury [Insulin Degludec]     Victoza [Liraglutide] Nausea And Vomiting    Flaxseed [Flaxseed (Linseed)] Rash    Omega 3 [Fish Oil] Rash    Peanut-Containing Drug  Products Rash

## 2023-06-23 NOTE — Telephone Encounter (Signed)
Pt returning missed call for results, relayed Dr Delbert Phenix results of carotid duplex and went over MyChart message that was sent. Pt has no further questions, appreciative of help.

## 2023-06-23 NOTE — Telephone Encounter (Addendum)
Left voicemail to call back or check MyChart for carotid duplex results. MyChart message sent. Repeat study ordered.     ----- Message from Thompson Caul sent at 06/23/2023  8:38 AM EDT -----  Regarding: PV results: Please contact pt  Carotid US-repeat in 2 years.  Stable findings with mild to moderate plaque, <50%.  No change from the 06/2021 report.  Thank you.

## 2023-06-25 ENCOUNTER — Encounter (INDEPENDENT_AMBULATORY_CARE_PROVIDER_SITE_OTHER): Payer: Self-pay

## 2023-06-26 ENCOUNTER — Other Ambulatory Visit: Payer: Self-pay | Admitting: Gerontology

## 2023-06-26 ENCOUNTER — Encounter (INDEPENDENT_AMBULATORY_CARE_PROVIDER_SITE_OTHER): Payer: Self-pay

## 2023-06-26 DIAGNOSIS — M51369 Other intervertebral disc degeneration, lumbar region without mention of lumbar back pain or lower extremity pain: Secondary | ICD-10-CM

## 2023-06-26 DIAGNOSIS — M549 Dorsalgia, unspecified: Secondary | ICD-10-CM

## 2023-06-27 ENCOUNTER — Encounter (INDEPENDENT_AMBULATORY_CARE_PROVIDER_SITE_OTHER): Payer: Self-pay | Admitting: Family Medicine

## 2023-06-27 ENCOUNTER — Telehealth (INDEPENDENT_AMBULATORY_CARE_PROVIDER_SITE_OTHER): Payer: Self-pay | Admitting: Family Medicine

## 2023-06-27 NOTE — Telephone Encounter (Signed)
Papers signed and handed to Ms Sherrilyn Rist

## 2023-06-27 NOTE — Telephone Encounter (Signed)
The patient brought in papers from [facility] that she stated need to be signed so she can get a wheel chair. She said she needs these signed as soon as possible. The papers were placed in Dr Loleta Chance folder at the front desk. Please call the patient if there are any questions.

## 2023-06-27 NOTE — Telephone Encounter (Signed)
Documentation given to Dr Delman Cheadle to sign.  Will ask front desk to fax

## 2023-06-28 ENCOUNTER — Encounter (INDEPENDENT_AMBULATORY_CARE_PROVIDER_SITE_OTHER): Payer: Self-pay | Admitting: Family Medicine

## 2023-06-29 ENCOUNTER — Telehealth (INDEPENDENT_AMBULATORY_CARE_PROVIDER_SITE_OTHER): Payer: Self-pay | Admitting: Family Medicine

## 2023-06-29 NOTE — Telephone Encounter (Addendum)
Received following message from chart span-    "FYI - On 06/22/2023 a patient care coordinator spoke with Emily Rowe DOB: 1951/06/13. She reported pain in her feet since yesterday and intends to see her provider for this on Monday 06/26/2023. She is requesting a callback from the office regarding her latest labs from 07/02/2023. Her best callback number is [phone]. -- We wanted to notify the provider of this call. Please let us know if we can be of further assistance. -- Thank you. AB"    Called pt to discuss, as I do not see an appt with Dr. Delman Cheadle from 9/10 and do not see labs from 9/15. I see labs from 8/13.    Spoke with pt. She states she has chronic back, knee, and foot pain and she saw her ortho on 9/6 and has an MRI scheduled for 9/19. She reports neuropathy and fibromyalgia.    We reviewed her labs from 8/13 and those are the labs that she was referring to in her call with chart span. She has an upcoming appt soon with her endo and will discuss her uncontrolled diabetes.    She declines an earlier appt with Dr. Delman Cheadle but will call us back if there is anything else we can assist her with or if she needs to be seen sooner.     She has no further questions at this time.

## 2023-07-06 ENCOUNTER — Ambulatory Visit: Payer: Medicare Other

## 2023-07-06 ENCOUNTER — Ambulatory Visit: Admission: RE | Admit: 2023-07-06 | Payer: Medicare Other | Source: Ambulatory Visit

## 2023-07-12 ENCOUNTER — Ambulatory Visit: Payer: Medicare Other | Admitting: Orthopaedic Surgery

## 2023-07-17 ENCOUNTER — Ambulatory Visit (INDEPENDENT_AMBULATORY_CARE_PROVIDER_SITE_OTHER): Payer: Medicare Other | Admitting: Internal Medicine

## 2023-07-17 ENCOUNTER — Encounter (HOSPITAL_BASED_OUTPATIENT_CLINIC_OR_DEPARTMENT_OTHER): Payer: Self-pay | Admitting: Internal Medicine

## 2023-07-17 VITALS — BP 171/90 | HR 85 | Temp 98.0°F | Resp 20 | Ht 66.0 in | Wt 200.2 lb

## 2023-07-17 DIAGNOSIS — E669 Obesity, unspecified: Secondary | ICD-10-CM

## 2023-07-17 DIAGNOSIS — E782 Mixed hyperlipidemia: Secondary | ICD-10-CM

## 2023-07-17 DIAGNOSIS — I10 Essential (primary) hypertension: Secondary | ICD-10-CM

## 2023-07-17 DIAGNOSIS — E1165 Type 2 diabetes mellitus with hyperglycemia: Secondary | ICD-10-CM

## 2023-07-17 DIAGNOSIS — Z794 Long term (current) use of insulin: Secondary | ICD-10-CM

## 2023-07-17 DIAGNOSIS — Z6832 Body mass index (BMI) 32.0-32.9, adult: Secondary | ICD-10-CM

## 2023-07-17 LAB — POCT GLUCOSE: Whole Blood Glucose POCT: 217 mg/dL — AB (ref 70–100)

## 2023-07-17 MED ORDER — ONETOUCH DELICA LANCETS 30G MISC
3 refills | Status: AC
Start: 2023-07-17 — End: ?

## 2023-07-17 MED ORDER — BD PEN NEEDLE NANO U/F 32G X 4 MM MISC
1 refills | Status: DC
Start: 2023-07-17 — End: 2024-02-05

## 2023-07-17 MED ORDER — FREESTYLE LIBRE 3 SENSOR MISC
1.0000 | 1 refills | Status: DC
Start: 2023-07-17 — End: 2023-08-16

## 2023-07-17 NOTE — Progress Notes (Signed)
Subjective:      Date: 07/17/2023 10:01 AM   Patient ID: Emily Rowe is a 72 y.o. female.    Chief Complaint:  Chief Complaint   Patient presents with    Diabetes     BS: 217         HPI  Visit type: follow-up - type II DM   Diagnosed: over 5 years ago  Type: Insulin requiring  Complications: hyperglycemia  Control: inadequate control  Comorbid illness: hyperlipidemia  Last follow-up:  11/10/21 with Dr. Maeola Harman      Diabetes Medication Regimen: Glipizide XL 20 mg daily, jardiance 10 mg daily , Lantus 12 units daily at night (not taking lantus at present), taking Lantus only if HS glucose is 170 or above. Did not try jardiance.     Prior medications: Unable to tolerate Metformin due to diarrhea, unable to tolerate Victoza due to n/v, invokana. Levemir (caused nausea and vomiting), Evaristo Bury (did not tolerate, not sure reason for intolerance)     Taking potassium pill daily along with eating sweet potato and avocado everyday.     Home glucose readings:  Today fasting blood glucose was 217 mg/dl.        Podiatric assessment: admits LE burning/tingling due to hx of stroke   Exercise:  walks occasionally   Diet: moderate compliance with recommended diet;   Response to therapy: with hemoglobin A1C above goal  A1c trend: improving     Lab Results   Component Value Date    HGBA1C 9.4 (H) 05/30/2023    HGBA1C 9.2 (H) 02/27/2023    HGBA1C 9.1 (H) 11/17/2022     Trend is above goal  Additional concerns: In addition, pt has hyperlipidemia and is compliant with lipid therapy.  Pt denies side effects of lipid therapy - specifically denies myalgia., In addition, pt has stable essential HTN with no evidence of CHF or proteinuria.  The patient is compliant with anti-hypertensive therapy and denies side effects to therapy.  Pt denies CP, SOB, dizziness, orthopnea, PND or edema.     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     INTERVAL HISTORY  Elements of HPI were reviewed and updated.  Did not receive lantus from pharmacy since 9/24 which resulted in higher blood glucose readings.    Hypertension  BP significantly elevated  - no evidence of CHF, denies chest pain, heart palpitations or fluid retention      24h diet recall:  BF- 1 toast (cannot have eggs)  Lunch-Turkey sandwich  Dinner/ Snack: Salad, arugula, mangoes    Recently diagnosed with lupus in 2/24. Reports increase in stress related to death in her family. Notes side effects with repatha. S/p cholecystectomy on 5/28. Reports increased stress related to health and finances. Told that she cannot proceed with knee surgery due to higher A1c.    ROS:  A complete 12 point ROS was obtained. Pertinent positives and negatives as noted in HPI. All other systems are negative.      Vitals:  BP (!) 161/100 (BP Site: Left arm, Patient Position: Sitting, Cuff Size: Large)   Pulse 71   Temp 98 F (36.7 C) (Temporal)   Resp 20   Ht 1.676 m (5\' 6" )   Wt 90.8 kg (200 lb 3.2 oz)   SpO2 96%   BMI 32.31 kg/m    Wt Readings from Last 3 Encounters:   07/17/23 90.8 kg (200 lb 3.2 oz)   06/23/23  91.6 kg (202 lb)   06/21/23 90.7 kg (200 lb)     BP is high today 162/95 repeat measurement. Patient states that she just took her BP medication.      Physical Exam   Constitutional: She is oriented to person, place, and time. She appears well-developed and well-nourished.   HENT:   Head: Normocephalic and atraumatic.   Mouth/Throat: Oropharynx is clear and moist.   Eyes: Conjunctivae and EOM are normal.    Neck: Normal range of motion. No thyromegaly present.   Neurological: She is alert and oriented to person, place, and time.   Psychiatric: She has a normal mood and affect.   Normal sensation on 10g monofilament testing RLE, slightly decreased sensation LLE.        Assessment and Plan     Diagnoses and plan were reviewed and updated    1. Uncontrolled type 2 diabetes mellitus with hyperglycemia  -   Lab Results   Component Value Date    HGBA1C 9.4 (H)  05/30/2023    HGBA1C 9.2 (H) 02/27/2023    HGBA1C 9.1 (H) 11/17/2022       Current A1C is elevated above goal.  -  Recommend increasing the dose of SGLT-2 Inhibitor therapy.  Side effects of SGLT2 inhibitor (UTI, mycotic infection, polyuria, change in renal function, hypotension) discussed with patient.  -  Recommend home glucometer testing twice a day (pre-breakfast or 2 hrs post-prandial).  Risk & Benefits of the new medication(s) were explained to the patient (and family) who verbalized understanding & agreed to the treatment plan. Patient (family) encouraged to contact me/clinical staff with any questions/concerns  Reports good glycemic control when switching from tradjenta to jardiance.  -  recommend carb consistent diet and stay physically active as tolerated  - referred to CDE for diet counseling, new referral entered, prefers in person appointment.    Completed diabetes education group classes 3 years ago and requesting 1:1 support for education.    Discussed using CGM to improve blood glucose monitoring and medication adjustment.        - empagliflozin (JARDIANCE) 10 MG tablet; Take 1 tablet (10 mg) by mouth every morning  Dispense: 90 tablet; Refill: 1  - Urine Microalbumin Random  - Hemoglobin A1C  - Comprehensive metabolic panel  - TSH  - Ambulatory referral to Diabetic Education; Future  - Urine Microalbumin Random; Future  - Hemoglobin A1C; Future  - Comprehensive metabolic panel; Future  - Lipid panel; Future    2. Obesity due to excess calories (BMI 32-32.9)  - Recommend calorie restricted diet and exercise 150 min/week combined cardio and strength training   - patient has hx of intolerance to glp-1 ra   - referred to CDE     3. Mixed hyperlipidemia  Lab Results   Component Value Date    CHOL 177 05/30/2023    CHOL 239 (H) 02/27/2023    CHOL 249 (H) 11/17/2022     Lab Results   Component Value Date    HDL 50 05/30/2023    HDL 49 02/27/2023    HDL 45 11/17/2022     Lab Results   Component Value Date     LDL 105 05/30/2023    LDL 034 (H) 02/27/2023    LDL 176 (H) 11/17/2022     Lab Results   Component Value Date    TRIG 110 05/30/2023    TRIG 112 02/27/2023    TRIG 142 11/17/2022       -  LDL is elevated  - hx of taking Repatha, reports he stopped Repatha because it has increased sugars  On Rosuvastatin 40 mg QHS at present.    - rosuvastatin (CRESTOR) 40 MG tablet; Take 1 tablet (40 mg) by mouth daily  Dispense: 90 tablet; Refill: 1  - Comprehensive metabolic panel  - Lipid panel  - Comprehensive metabolic panel; Future  - Lipid panel; Future    4. Essential hypertension  - per report ambulatory BP is well controlled, has element of white coat HTN.   - on Losartan 100 mg daily. Recommend increasing dose of verapamil ER to 180 mg daily and continue follow up with primary care physician regarding blood pressure monitoring.    - Comprehensive metabolic panel; Future    5. Long-term insulin use    RTC in 3-4 months with me and F/U with Deanna Artis in 6 weeks (will need A1c checked during f/u with Deanna Artis, NP    Tanya Nones MD MPH  Endocrinologist, [facility]

## 2023-07-18 ENCOUNTER — Telehealth (INDEPENDENT_AMBULATORY_CARE_PROVIDER_SITE_OTHER): Payer: Self-pay | Admitting: Family Medicine

## 2023-07-18 NOTE — Telephone Encounter (Signed)
Patient called and requested an update on her [employer] Personal Mobility. She stated she needs to get this figured out today and that she is very upset. Please call patient at [phone].

## 2023-07-18 NOTE — Telephone Encounter (Signed)
PT called and would like a call back ASAP regarding a wheelchair. PT declined to elaborate the reason    Pls assist. Thank you    Phone: [phone]

## 2023-07-18 NOTE — Telephone Encounter (Signed)
Called patient to let her know Physical therapy and occupational therapy have to fill out part of the form. Advised Dr Delman Cheadle and I are off tomorrow so I will call them first thing Thursday morning and get back to her.  Patient appreciated the call

## 2023-07-19 ENCOUNTER — Ambulatory Visit: Payer: Medicare Other

## 2023-07-19 ENCOUNTER — Other Ambulatory Visit (HOSPITAL_BASED_OUTPATIENT_CLINIC_OR_DEPARTMENT_OTHER): Payer: Self-pay

## 2023-07-19 DIAGNOSIS — E1165 Type 2 diabetes mellitus with hyperglycemia: Secondary | ICD-10-CM

## 2023-07-19 MED ORDER — GLIPIZIDE ER 10 MG PO TB24
10.0000 mg | ORAL_TABLET | Freq: Two times a day (BID) | ORAL | 0 refills | Status: DC
Start: 2023-07-19 — End: 2023-10-21

## 2023-07-20 NOTE — Telephone Encounter (Signed)
Spoke with [facility]-they state they did not receive the mobility exam from.  Refaxed now

## 2023-07-21 NOTE — Telephone Encounter (Addendum)
Fax did not go through yesterday.  Refaxed today and called [facility] to confirm they received it.  State they did

## 2023-07-25 ENCOUNTER — Encounter (INDEPENDENT_AMBULATORY_CARE_PROVIDER_SITE_OTHER): Payer: Self-pay

## 2023-07-27 ENCOUNTER — Encounter (HOSPITAL_BASED_OUTPATIENT_CLINIC_OR_DEPARTMENT_OTHER): Payer: Self-pay | Admitting: Internal Medicine

## 2023-07-27 NOTE — Progress Notes (Signed)
Writer faxed completed Diabetic Standard Written Order form for diabetic testing supplies back to  [facility]. Fx# [phone] (alternative number). Document would not transmit to fax number on form [phone]      Rosalio Macadamia, LPN II  0:86 pm

## 2023-08-01 ENCOUNTER — Ambulatory Visit (INDEPENDENT_AMBULATORY_CARE_PROVIDER_SITE_OTHER): Payer: Medicare Other | Admitting: Cardiovascular Disease

## 2023-08-07 ENCOUNTER — Ambulatory Visit (INDEPENDENT_AMBULATORY_CARE_PROVIDER_SITE_OTHER): Payer: Medicare Other

## 2023-08-07 DIAGNOSIS — E119 Type 2 diabetes mellitus without complications: Secondary | ICD-10-CM

## 2023-08-07 DIAGNOSIS — I10 Essential (primary) hypertension: Secondary | ICD-10-CM

## 2023-08-16 ENCOUNTER — Encounter (HOSPITAL_BASED_OUTPATIENT_CLINIC_OR_DEPARTMENT_OTHER): Payer: Self-pay | Admitting: Family Nurse Practitioner

## 2023-08-16 ENCOUNTER — Ambulatory Visit (INDEPENDENT_AMBULATORY_CARE_PROVIDER_SITE_OTHER): Payer: Medicare Other | Admitting: Family Nurse Practitioner

## 2023-08-16 ENCOUNTER — Telehealth (HOSPITAL_BASED_OUTPATIENT_CLINIC_OR_DEPARTMENT_OTHER): Payer: Self-pay | Admitting: Internal Medicine

## 2023-08-16 VITALS — BP 142/85 | HR 72 | Temp 97.8°F

## 2023-08-16 DIAGNOSIS — E1165 Type 2 diabetes mellitus with hyperglycemia: Secondary | ICD-10-CM

## 2023-08-16 MED ORDER — EMPAGLIFLOZIN 25 MG PO TABS
25.0000 mg | ORAL_TABLET | Freq: Every day | ORAL | 5 refills | Status: DC
Start: 2023-08-16 — End: 2024-07-30

## 2023-08-16 NOTE — Progress Notes (Signed)
 Last appointment: 9.30.24 with DR GR  Onset DM: 5+yrs ago  Recent Hospitalization: None  Hypoglycemic events: None  Current DM Regiment: Glipizide XL 10 mg daily, jardiance 10 mg daily , Lantus 10 units daily at night (not taking lantus at present), taking

## 2023-08-16 NOTE — Progress Notes (Signed)
 Date Time: @IPTODAYDATE @ 6:46 PM  Patient Name: Emily Rowe  Attending Physician: No att. providers found    Date: 08/16/2023 6:46 PM   Patient ID: Emily Rowe is a 72 y.o. female.  Chief Complaint:     Chief Complaint   Patient presents with    Diab

## 2023-08-16 NOTE — Telephone Encounter (Signed)
 Writer called [facility] in regards to multiple request for the return of the Diabetic Standard Written Order form. I spoke with Mark/ID#: [phone] (rep) who confirmed that Emily Rowe form was received and to disregard the additional request. Writer verbalized

## 2023-08-16 NOTE — Patient Instructions (Addendum)
 Hello     You saw Emily Conroy, NP on 08/16/2023 for follow up on blood sugar management   The following issues were addressed:  AIC Goals Current A1C is elevated above goal and 9.4% 05/30/2023 .   Plan Going Forward     Increase Jardiance 25 mg daily   C

## 2023-08-17 ENCOUNTER — Encounter (INDEPENDENT_AMBULATORY_CARE_PROVIDER_SITE_OTHER): Payer: Self-pay | Admitting: Family Medicine

## 2023-08-25 ENCOUNTER — Encounter (INDEPENDENT_AMBULATORY_CARE_PROVIDER_SITE_OTHER): Payer: Self-pay

## 2023-08-29 ENCOUNTER — Ambulatory Visit (INDEPENDENT_AMBULATORY_CARE_PROVIDER_SITE_OTHER): Payer: Medicare Other | Admitting: Family Medicine

## 2023-08-31 ENCOUNTER — Other Ambulatory Visit (INDEPENDENT_AMBULATORY_CARE_PROVIDER_SITE_OTHER): Payer: Self-pay | Admitting: Family Medicine

## 2023-08-31 DIAGNOSIS — I10 Essential (primary) hypertension: Secondary | ICD-10-CM

## 2023-09-01 ENCOUNTER — Encounter (INDEPENDENT_AMBULATORY_CARE_PROVIDER_SITE_OTHER): Payer: Self-pay | Admitting: Family Medicine

## 2023-09-01 ENCOUNTER — Ambulatory Visit (INDEPENDENT_AMBULATORY_CARE_PROVIDER_SITE_OTHER): Payer: Medicare Other

## 2023-09-01 ENCOUNTER — Ambulatory Visit (INDEPENDENT_AMBULATORY_CARE_PROVIDER_SITE_OTHER): Payer: Medicare Other | Admitting: Family Medicine

## 2023-09-01 VITALS — BP 159/60 | HR 69 | Temp 97.7°F | Resp 16 | Wt 202.0 lb

## 2023-09-01 DIAGNOSIS — I10 Essential (primary) hypertension: Secondary | ICD-10-CM

## 2023-09-01 DIAGNOSIS — E079 Disorder of thyroid, unspecified: Secondary | ICD-10-CM

## 2023-09-01 DIAGNOSIS — I69359 Hemiplegia and hemiparesis following cerebral infarction affecting unspecified side: Secondary | ICD-10-CM

## 2023-09-01 DIAGNOSIS — E1165 Type 2 diabetes mellitus with hyperglycemia: Secondary | ICD-10-CM

## 2023-09-01 DIAGNOSIS — E782 Mixed hyperlipidemia: Secondary | ICD-10-CM

## 2023-09-01 DIAGNOSIS — F32A Depression, unspecified: Secondary | ICD-10-CM

## 2023-09-01 DIAGNOSIS — Z8673 Personal history of transient ischemic attack (TIA), and cerebral infarction without residual deficits: Secondary | ICD-10-CM

## 2023-09-01 DIAGNOSIS — E119 Type 2 diabetes mellitus without complications: Secondary | ICD-10-CM

## 2023-09-01 DIAGNOSIS — E1142 Type 2 diabetes mellitus with diabetic polyneuropathy: Secondary | ICD-10-CM

## 2023-09-01 LAB — COMPREHENSIVE METABOLIC PANEL
ALT: 20 U/L (ref <=55)
AST (SGOT): 22 U/L (ref <=41)
Albumin/Globulin Ratio: 1.3 (ref 0.9–2.2)
Albumin: 3.7 g/dL (ref 3.5–5.0)
Alkaline Phosphatase: 94 U/L (ref 37–117)
Anion Gap: 9 (ref 5.0–15.0)
BUN: 15 mg/dL (ref 7–21)
Bilirubin, Total: 0.4 mg/dL (ref 0.2–1.2)
CO2: 26 meq/L (ref 17–29)
Calcium: 8.7 mg/dL (ref 8.5–10.5)
Chloride: 104 meq/L (ref 99–111)
Creatinine: 0.7 mg/dL (ref 0.4–1.0)
GFR: >60 mL/min/{1.73_m2} (ref >=60.0)
Globulin: 2.9 g/dL (ref 2.0–3.6)
Glucose: 178 mg/dL — ABNORMAL HIGH (ref 70–100)
Hemolysis Index: 9 {index}
Potassium: 4.3 meq/L (ref 3.5–5.3)
Protein, Total: 6.6 g/dL (ref 6.0–8.3)
Sodium: 139 meq/L (ref 135–145)

## 2023-09-01 LAB — LIPID PANEL
Cholesterol / HDL Ratio: 4.3 {index}
Cholesterol: 198 mg/dL (ref <=199)
HDL: 46 mg/dL (ref >=40)
LDL Calculated: 129 mg/dL — ABNORMAL HIGH (ref 0–99)
Triglycerides: 114 mg/dL (ref 34–149)
VLDL Calculated: 23 mg/dL (ref 10–40)

## 2023-09-01 LAB — URINE MICROALBUMIN, RANDOM
Urine Creatinine: 75 mg/dL
Urine Microalbumin/Creatinine Ratio: 9 ug/mg (ref <30)
Urine Microalbumin: 7 ug/mL (ref 0.0–30.0)

## 2023-09-01 LAB — HEMOGLOBIN A1C
Average Estimated Glucose: 231.7 mg/dL
Hemoglobin A1C: 9.7 % — ABNORMAL HIGH (ref 4.6–5.6)

## 2023-09-01 NOTE — Progress Notes (Signed)
 Have you seen any specialists/other providers since your last visit with us ?    No    Arm preference verified?   Yes    Health Maintenance Due   Topic Date Due    Advance Directive on File  Never done    Shingrix Vaccine 50+ (1) Never done    Tetanus Ten-Year  09/04/2022    INFLUENZA VACCINE  05/18/2023    COVID-19 Vaccine (4 - 2023-24 season) 06/18/2023         Emily Rowe is a 72 y.o. female who presents for Diabetes Follow-up (Patient is fasting )   History of Present Illness  The patient presents for a regular diabetes follow-up.    She is fasting today and has been under the care of her endocrinologist, Dr. Conni, since her last visit on 05/30/2023. She has been using a [employer] continuous glucose monitor but found it bothersome due to constant beeping. She discontinued Repatha  due to its impact on her blood sugar levels. Metformin  was not an option due to diarrhea, and she experienced nausea and vomiting with Levemir . Tresiba  was also not well-tolerated. She is currently on Lantus  insulin  (10 units) and Jardiance  (25 mg). Her glipizide  dosage remains at 10 mg twice daily.     She continues to experience neuropathy in her feet and new pain. She has weakness on both sides from a previous stroke. She has seen an eye doctor and reports no diabetic complications in her eyes.    She experienced dizziness for about 4 hours after her verapamil  dosage was increased to 180 mg. She takes two losartan  pills at night. Hydrochlorothiazide was discontinued due to skin sensitivity.    She has been attending water physical therapy. She is right-handed and experiences coughing when food goes to the right side of her throat.    She has depression and is handling it okay. She does not endorse any bad thoughts about hurting herself. She stopped Cymbalta  because of side effects. She tried Pristiq and had side effects. She was given Zoloft  a long time ago. She has been drinking tea to help manage her mood and plans to  follow up with her counselor.    She had gallbladder surgery.      Verbal consent obtained to record this visit.     HPI       The other 14 point review systems are negative except those in HPI.    Current Medications:  Medications Taking[1]    Past Medical History:  Medical History[2]    Past Surgical History:  Past Surgical History[3]    Family History:  Family History[4]    Social History:   reports that she has never smoked. She has never been exposed to tobacco smoke. She has never used smokeless tobacco. She reports that she does not drink alcohol and does not use drugs.    Allergies:  Allergies[5]     The following sections were reviewed this encounter by the provider:   Tobacco  Allergies  Meds  Problems  Med Hx  Surg Hx  Fam Hx         VITALS:  Vitals:    09/01/23 0751   BP: 159/60   Pulse: 69   Resp: 16   Temp: 97.7 F (36.5 C)   TempSrc: Oral   SpO2: 96%   Weight: 91.6 kg (202 lb)           ROS:  General/Constitutional:   Denies Chills. Denies Fatigue. Denies  Fever.   Ophthalmologic:   Denies Blurred vision. Denies Eye Pain.   ENT:   Denies Nasal Discharge. Denies Ear pain. Denies Sinus pain.   Endocrine:   Denies Polydipsia. Denies Polyuria.   Respiratory:   Denies Cough. Denies Orthopnea. Denies Shortness of breath. Denies Wheezing.   Cardiovascular:   Denies Chest pain. Denies Chest pain with exertion. Denies Leg Claudication. Denies      Palpitations. Denies Swelling in hands/feet.   Gastrointestinal:   Denies Abdominal pain. Denies Blood in stool. Denies Constipation. Denies Diarrhea.      Denies Heartburn. Denies Nausea. Denies Vomiting.   Genitourinary:   Denies Blood in urine. Denies Frequent urination. Denies Painful urination.   Musculoskeletal:   + Leg cramps. + Muscle aches.   Skin:   Denies Skin lesion(s).   Neurologic:   Denies Dizziness. +Gait abnormality. Denies Headache. +  Tingling/Numbness.      Physical Exam:  Alert and oriented. Affect is normal and appropriate. Clear thought  process with normal reasoning and conversational tone and logic. No psychomotor retardation. Not in any acute distress  Skin: no rash or abnormal lesions  Eyes - normal eye movements.  Pupils equal and reactive to light  Eyelid normal on exam  Ears - TM clear, external ear normal  Nose - no acute changes  Throat - Oral mucosa moist and pink.  Neck- LEFT SIDE neck thyroid  nodule, nontender, no palpable cervical lymph nodes  HEART: S1S2 heard, regular rate and rhythm  LUNGS: good air exchange, no crackles or ronchi or wheezing  ABDOMEN: Positive bowel sounds, no hepatospleenomegaly appreciated, no tenderness.  She has some diffuse discomfort hard to appreciate any Murphy sign.  Back: Normal curvature, no tenderness.   Extremities: FROM, no deformities, no edema, no erythema.   No knee effusion.   mild tendernesson the joint line.  Neuro: Physiological, speech better than before.   Right side weakness.   Restricted and only able to abduct <20%, Right leg weakness.   Strength RUE and RLE 3/5.        Psych-good eye contact  Train of thought normal  Judgment normal    Physical Exam  Vital Signs  Blood pressure reading is 159 systolic.          Lab Results   Component Value Date    WBC 6.25 11/17/2022    HGB 13.6 11/17/2022    HCT 43.2 11/17/2022    PLT 151 11/17/2022    CHOL 177 05/30/2023    TRIG 110 05/30/2023    HDL 50 05/30/2023    LDL 105 05/30/2023    ALT 14 05/30/2023    AST 17 05/30/2023    NA 138 05/30/2023    K 4.4 05/30/2023    CL 105 05/30/2023    CREAT 0.9 05/30/2023    BUN 19 05/30/2023    CO2 25 05/30/2023    TSH 2.10 11/17/2022    INR 1.0 07/14/2021    GLU 175 (H) 05/30/2023    HGBA1C 9.4 (H) 05/30/2023       Results  Laboratory Studies  A1c was 9.4. LDL was 105.    Assessment/Plan:         1. Uncontrolled type 2 diabetes mellitus with hyperglycemia  - Comprehensive Metabolic Panel; Future  - Lipid Panel; Future  - Hemoglobin A1C; Future  - Urine Microalbumin, Random; Future  - Comprehensive Metabolic  Panel  - Lipid Panel  - Hemoglobin A1C  - Urine Microalbumin, Random  2. Diabetic peripheral neuropathy  - Comprehensive Metabolic Panel; Future  - Lipid Panel; Future  - Hemoglobin A1C; Future  - Urine Microalbumin, Random; Future  - Comprehensive Metabolic Panel  - Lipid Panel  - Hemoglobin A1C  - Urine Microalbumin, Random    3. Depression, unspecified depression type  - Comprehensive Metabolic Panel; Future  - Lipid Panel; Future  - Hemoglobin A1C; Future  - Urine Microalbumin, Random; Future  - Comprehensive Metabolic Panel  - Lipid Panel  - Hemoglobin A1C  - Urine Microalbumin, Random    4. Mixed hyperlipidemia  - Comprehensive Metabolic Panel; Future  - Lipid Panel; Future  - Hemoglobin A1C; Future  - Urine Microalbumin, Random; Future  - Comprehensive Metabolic Panel  - Lipid Panel  - Hemoglobin A1C  - Urine Microalbumin, Random    5. History of hemorrhagic stroke with residual hemiparesis    6. History of CVA (cerebrovascular accident)  - Comprehensive Metabolic Panel; Future  - Lipid Panel; Future  - Hemoglobin A1C; Future  - Urine Microalbumin, Random; Future  - Comprehensive Metabolic Panel  - Lipid Panel  - Hemoglobin A1C  - Urine Microalbumin, Random    7. Swelling of thyroid  gland  - US  Head Neck Soft Tissue; Future         Assessment & Plan  1. Diabetes Mellitus.  Her A1c was previously recorded at 9.4. She has experienced side effects with several medications including metformin , Victoza , Levemir , and Tresiba . Her current regimen includes Jardiance  25 mg, Lantus  10 units, and glipizide  10 mg twice a day. She is advised to continue these medications and follow up with her endocrinologist. Blood work will be conducted today.    2. Hypertension.  Her blood pressure was noted to be 149/XX on a recent check. She is currently on verapamil  180 mg and losartan  100 mg. She is advised to continue these medications, follow up with her cardiologist, adhere to a low salt diet, and stay hydrated.    3.  Hyperlipidemia.  Her last LDL was 105 while on Repatha . She is advised to continue simvastatin  20 mg for cholesterol management.    4. History of Stroke.  She exhibits weakness in her right hand, knee, and leg, with the right side being significantly weaker than the left. She is right-handed. She is advised to continue physical therapy and work closely with her neurologist.    5. Thyroid  Swelling.  Her thyroid  appears slightly swollen. A thyroid  ultrasound is recommended to check for nodules or other abnormalities.    6. Depression.  She reports not feeling well but is handling it okay without any thoughts of self-harm. She has previously experienced side effects with Cymbalta  and Pristiq. She is advised to try sertraline  at the [facility] but deferred.  No SI  Strongly advised see therapist    7. Health Maintenance.  She is advised to receive her influenza vaccine soon. She is also recommended to follow up with her gastroenterologist and counselor.         Follow Up In Primary Care         Routine, Future, Expected: 11/22/2023, Expires: 12/01/2024 Select the type of follow up: Medicare Wellness Who is this follow-up with? Any Other Provider in My Clinic For: ok with RN                Verbal consent obtained to record this visit.     Alisandra Son ONEIDA Commodore, MD        This note was  generated by the Epic EMR system/ Dragon speech recognition and may contain inherent errors or omissions not intended by the user. Grammatical errors, random word insertions, deletions, pronoun errors and incomplete sentences are occasional consequences of this technology due to software limitations. Not all errors are caught or corrected. If there are questions or concerns about the content of this note or information contained within the body of this dictation they should be addressed directly with the author for clarification         [1]   Outpatient Medications Marked as Taking for the 09/01/23 encounter (Office Visit) with Racquelle Hyser,  Altha Sweitzer T, MD   Medication Sig Dispense Refill    aspirin  81 MG chewable tablet Chew 1 tablet (81 mg) by mouth daily      empagliflozin  (Jardiance ) 25 MG tablet Take 1 tablet (25 mg) by mouth daily 60 tablet 5    glipiZIDE  (GLUCOTROL ) 10 MG 24 hr tablet Take 1 tablet (10 mg) by mouth 2 (two) times daily 180 tablet 0    glucose blood test strip Check sugar three times a day, ICD 10 E11.9 100 each 10    hydrocortisone 2.5 % cream as needed      insulin  glargine (LANTUS  SOLOSTAR) 100 UNIT/ML injection pen Inject 10 units into skin daily at bedtime ONLY AS NEEDED IF BLOOD SUGAR >OR = TO 170 LAST TAKEN 07/2020 15 mL 1    Insulin  Pen Needle (BD Pen Needle Nano U/F) 32G X 4 MM Misc Use as directed daily with insulin  pen 100 each 1    LORazepam  (ATIVAN ) 0.5 MG tablet Take 0.5 tablets (0.25 mg) by mouth nightly as needed for Anxiety 20 tablet 0    losartan  (COZAAR ) 50 MG tablet TAKE 1 TABLET(50 MG) BY MOUTH TWICE DAILY 60 tablet 3    Misc. Devices Furniture Conservator/restorer) Misc Use as directed 1 each 0    OneTouch Delica Lancets 30G Misc Test sugars x2/ day 100 each 3    simvastatin  (ZOCOR ) 20 MG tablet Take 1 tablet (20 mg) by mouth nightly One tab po qd      verapamil  (VERELAN ) 180 MG 24 hr ER capsule Take 1 capsule (180 mg) by mouth daily 90 capsule 1    vitamin D , ergocalciferol , (DRISDOL ) 50000 UNIT Cap TAKE 1 CAPSULE BY MOUTH 1 TIME A WEEK 12 capsule 1   [2]   Past Medical History:  Diagnosis Date    Anxiety     Asthma     allergic reaction to mold, not since 20 years, HAS WHEEZING IN COLD WEATHER. NO INHALERS    Bowel perforation     COLONOSCOPY 09/2019    Depression     Diabetes     Difficulty walking     WEAK RIGHT LEG AFTER CVA 2021    Dysphagia     BIG PILLS ONLY     Gastroesophageal reflux disease     PREVACID OTC PRN     Hyperlipemia     Hypertensive disorder     Hypokalemia     CMP REQ PCP 09/14/20    Neuropathy     NUMBNESS RIGHT UE AND LE     Post-operative nausea and vomiting     ALWAYS WITH ANESTHESIA     Sleep apnea  2017    NO CPAP NEEDED PER PT     Stroke 2012     ARM, LEG WEAK. WALKS WITH CANE, WALKER    TIA (transient ischemic attack)     MULTIPLE BEFORE  CVA 2012    Type 2 diabetes mellitus, controlled 2021    MANAGED BY DR CLARETTA RAO. AM FSB 117-140. TAKES LANTUS  PRN IN AM. HAIC 7.9   [3]   Past Surgical History:  Procedure Laterality Date    BREAST BIOPSY Left     benign lump biopsy    COLONOSCOPY, DIAGNOSTIC (SCREENING)      X 2    COLONOSCOPY, DIAGNOSTIC (SCREENING) N/A 09/17/2020    Procedure: COLONOSCOPY;  Surgeon: Nemiah Lamar BIRCH, MD;  Location: [place] [facility];  Service: Gastroenterology;  Laterality: N/A;    EGD N/A 10/28/2020    Procedure: EGD w/ bx's;  Surgeon: Marvis Waunita RAMAN, MD;  Location: [place] [facility];  Service: Gastroenterology;  Laterality: N/A;    GALLBLADDER SURGERY  03/14/2023    HYSTERECTOMY      2005    OOPHORECTOMY Bilateral     2005    REDUCTION MAMMAPLASTY      TONSILLECTOMY      urinary bladder      BLADDER LIFT    [4]   Family History  Problem Relation Name Age of Onset    Heart attack Mother      Diabetes Father      Diabetes Maternal Grandmother      Stroke Maternal Grandmother      Stroke Maternal Grandfather      Breast cancer Daughter  30    Colon cancer Neg Hx     [5]   Allergies  Allergen Reactions    Codeine Nausea And Vomiting    Crestor  [Rosuvastatin ] Rash     D/c due to severe rash and higher blood sugars    Glucosamine Hives and Nausea And Vomiting    Latex Rash    Tree Nuts Hives     ESPECIALLY ALMONDS     Almond Oil     Coconut (Cocos Nucifera)     Dairy [Milk-Related Compounds]     Egg-Derived Products      Patient had an allergy test and stated that she was allergic to eggs    Gabapentin      Dizziness and rash, irregular heart rate    Gluten Meal     Invokana  [Canagliflozin ]     Levemir  [Insulin  Detemir] Nausea And Vomiting    Lyrica [Pregabalin]      PT STATES IT MAKES HER NEUROPATHY WORSE STATES SHE FEELS LIKE SHE IS ON FIRE    Metformin       diarrhea    Repatha   [Evolocumab ] Headaches    Sucralfate Nausea And Vomiting     BODY ACHES, FEVER    Tresiba  [Insulin  Degludec]     Victoza  [Liraglutide ] Nausea And Vomiting    Flaxseed [Flaxseed (Linseed)] Rash    Omega 3 [Fish Oil] Rash    Peanut-Containing Drug Products Rash

## 2023-09-13 ENCOUNTER — Ambulatory Visit (HOSPITAL_BASED_OUTPATIENT_CLINIC_OR_DEPARTMENT_OTHER): Payer: Medicare Other | Admitting: Family Nurse Practitioner

## 2023-09-24 ENCOUNTER — Encounter (INDEPENDENT_AMBULATORY_CARE_PROVIDER_SITE_OTHER): Payer: Self-pay

## 2023-10-04 ENCOUNTER — Ambulatory Visit (INDEPENDENT_AMBULATORY_CARE_PROVIDER_SITE_OTHER): Payer: Medicare Other

## 2023-10-04 DIAGNOSIS — I10 Essential (primary) hypertension: Secondary | ICD-10-CM

## 2023-10-04 DIAGNOSIS — E119 Type 2 diabetes mellitus without complications: Secondary | ICD-10-CM

## 2023-10-05 ENCOUNTER — Other Ambulatory Visit (HOSPITAL_BASED_OUTPATIENT_CLINIC_OR_DEPARTMENT_OTHER): Payer: Self-pay

## 2023-10-05 DIAGNOSIS — I10 Essential (primary) hypertension: Secondary | ICD-10-CM

## 2023-10-05 MED ORDER — VERAPAMIL HCL ER 180 MG PO CP24
180.0000 mg | ORAL_CAPSULE | Freq: Every day | ORAL | 1 refills | Status: AC
Start: 2023-10-05 — End: ?

## 2023-10-19 ENCOUNTER — Telehealth: Payer: Self-pay | Admitting: Internal Medicine

## 2023-10-19 DIAGNOSIS — E1165 Type 2 diabetes mellitus with hyperglycemia: Secondary | ICD-10-CM

## 2023-10-19 MED ORDER — GLIPIZIDE 10 MG PO TABS
ORAL_TABLET | ORAL | 1 refills | Status: DC
Start: 2023-10-19 — End: 2023-11-20

## 2023-10-19 NOTE — Telephone Encounter (Signed)
From my understanding glipizide 2.5 mg dose is the one no longer available. I have sent Prescription for glipizide regular strength 10 mg twice day with food. If she still has difficulty getting the medication, can you please call the pharmacist to review the concerns directly? Thanks

## 2023-10-19 NOTE — Telephone Encounter (Signed)
MA relayed the following information to pt in a VM. MA let pt know that Dr. Maeola Harman said "From my understanding glipizide 2.5 mg dose is the one no longer available. I have sent Prescription for glipizide regular strength 10 mg twice day with food." MA let pt know that he called the pharmacy to confirm they have it and that pt should reach out if any questions/concerns.

## 2023-10-19 NOTE — Telephone Encounter (Signed)
MA spoke to pt to get further clarification. MA let pt know that as far as he knows, glipizide is still made. Pt states the place where she was receiving it from has informed her that it is no longer being made. MA tried to let pt know that maybe they were trying to say that she does not have any further refills, but pt insisted that it is no longer made and would like an alternative sent in to [facility].

## 2023-10-19 NOTE — Telephone Encounter (Signed)
Copied from CRM [id number]. Topic: Clinical Support - Speak With Nurse  >> Oct 19, 2023 10:33 AM Wilder Glade Rowe wrote:  Emily Rowe called about Clinical Support - Speak With Nurse.  Additional details:  >> Oct 19, 2023 10:36 AM Emily Rowe wrote:  Patent called in requesting to speak with a nurse in regards to her  glipiZIDE (GLUCOTROL) 10 MG 24 hr tablet  Patient states that her pharmacy no longer prescribed that medication and is requesting a new medication.

## 2023-10-20 NOTE — Telephone Encounter (Signed)
MA returned pt call. Pt just wanted an alternative for the glipizide.

## 2023-10-20 NOTE — Telephone Encounter (Addendum)
Patient is stating that the medication that they use to take was Glipizide ER [facility]) was the former medication she use to obtain. Patient states that this is the only medication that works for them.     Patient states that the medication sent to their pharmacy is the version that does not work for them.    Patient is asking for an alternative medication to see if it works for their issues.     If we could please assist with this request.    Patient has medical question about medication regimen as well. IF we could please give them a call back.     Thank you

## 2023-10-21 MED ORDER — GLIPIZIDE ER 10 MG PO TB24
10.0000 mg | ORAL_TABLET | Freq: Two times a day (BID) | ORAL | 0 refills | Status: DC
Start: 2023-10-21 — End: 2023-11-20

## 2023-10-21 NOTE — Addendum Note (Signed)
 Addended by: Tanya Nones on: 10/21/2023 05:41 PM     Modules accepted: Orders

## 2023-10-21 NOTE — Telephone Encounter (Signed)
 Please let the patient know that I would recommend trial of regular glipizide  taken twice daily first given previous history of medication intolerance to several class of medication. The other option would be starting on short acting insulin  prior to meals. Please have the patient report blood sugars for the last 3 days. Thanks

## 2023-10-23 ENCOUNTER — Encounter (HOSPITAL_BASED_OUTPATIENT_CLINIC_OR_DEPARTMENT_OTHER): Payer: Self-pay

## 2023-10-25 ENCOUNTER — Encounter (INDEPENDENT_AMBULATORY_CARE_PROVIDER_SITE_OTHER): Payer: Self-pay

## 2023-11-02 ENCOUNTER — Encounter (INDEPENDENT_AMBULATORY_CARE_PROVIDER_SITE_OTHER): Payer: Self-pay

## 2023-11-03 ENCOUNTER — Encounter (INDEPENDENT_AMBULATORY_CARE_PROVIDER_SITE_OTHER): Payer: Self-pay

## 2023-11-09 ENCOUNTER — Ambulatory Visit (INDEPENDENT_AMBULATORY_CARE_PROVIDER_SITE_OTHER): Payer: Medicare Other

## 2023-11-09 DIAGNOSIS — E119 Type 2 diabetes mellitus without complications: Secondary | ICD-10-CM

## 2023-11-09 DIAGNOSIS — I10 Essential (primary) hypertension: Secondary | ICD-10-CM

## 2023-11-16 ENCOUNTER — Telehealth (HOSPITAL_BASED_OUTPATIENT_CLINIC_OR_DEPARTMENT_OTHER): Payer: Self-pay | Admitting: Internal Medicine

## 2023-11-20 ENCOUNTER — Telehealth (HOSPITAL_BASED_OUTPATIENT_CLINIC_OR_DEPARTMENT_OTHER): Payer: Self-pay | Admitting: Family Nurse Practitioner

## 2023-11-20 ENCOUNTER — Telehealth (INDEPENDENT_AMBULATORY_CARE_PROVIDER_SITE_OTHER): Payer: Medicare Other | Admitting: Internal Medicine

## 2023-11-20 ENCOUNTER — Encounter (HOSPITAL_BASED_OUTPATIENT_CLINIC_OR_DEPARTMENT_OTHER): Payer: Self-pay | Admitting: Internal Medicine

## 2023-11-20 VITALS — BP 151/96 | HR 74 | Temp 97.1°F | Resp 18 | Ht 66.0 in | Wt 198.6 lb

## 2023-11-20 DIAGNOSIS — E669 Obesity, unspecified: Secondary | ICD-10-CM

## 2023-11-20 DIAGNOSIS — I10 Essential (primary) hypertension: Secondary | ICD-10-CM

## 2023-11-20 DIAGNOSIS — E1165 Type 2 diabetes mellitus with hyperglycemia: Secondary | ICD-10-CM

## 2023-11-20 DIAGNOSIS — Z6832 Body mass index (BMI) 32.0-32.9, adult: Secondary | ICD-10-CM

## 2023-11-20 DIAGNOSIS — E782 Mixed hyperlipidemia: Secondary | ICD-10-CM

## 2023-11-20 DIAGNOSIS — Z794 Long term (current) use of insulin: Secondary | ICD-10-CM

## 2023-11-20 MED ORDER — REPAGLINIDE 0.5 MG PO TABS
ORAL_TABLET | ORAL | 1 refills | Status: DC
Start: 2023-11-20 — End: 2023-11-20

## 2023-11-20 MED ORDER — REPAGLINIDE 1 MG PO TABS
ORAL_TABLET | ORAL | 1 refills | Status: DC
Start: 2023-11-20 — End: 2023-12-05

## 2023-11-20 NOTE — Addendum Note (Signed)
 Addended by: Pecola Lawless on: 11/20/2023 11:19 AM     Modules accepted: Orders

## 2023-11-20 NOTE — Addendum Note (Signed)
 Addended by: Pecola Lawless on: 11/20/2023 11:22 AM     Modules accepted: Orders

## 2023-11-20 NOTE — Patient Instructions (Addendum)
 Recommend increasing lantus to 14 units at bedtime. Target fasting blood sugars 100-150 mg/dl.  Discontinue glipizide.  Recommend initiation of prandin 1 mg 30 min prior to meals x2/ day prior to lunch and dinner

## 2023-11-20 NOTE — Telephone Encounter (Signed)
 Copied from CRM #[id number]. Topic: Clinical Support - Medical Question  >> Nov 20, 2023  4:49 PM Normand F wrote:  Emily Rowe is calling from [facility] to request additional information for tier exception request to lower the pt's OOP cost for empagliflozin  (Jardiance ) 25 MG tablet   Please contact: (P): [phone] and reference case number: [id number]  (F): [phone]  Per rep, turn around time for the request is 71hrs-29min

## 2023-11-20 NOTE — Progress Notes (Signed)
 Subjective:      Date: 11/20/2023 10:44 AM   Patient ID: Emily Rowe is a 73 y.o. female.    Chief Complaint:  Chief Complaint   Patient presents with    Diabetes     BS:   206     Verbal consent has been obtained from the patient to conduct a video and telephone: yes      HPI  Visit type: follow-up - type II DM   Diagnosed: over 5 years ago  Type: Insulin  requiring  Complications: hyperglycemia  Control: inadequate control  Comorbid illness: hyperlipidemia  Last follow-up:  11/10/21 with Dr. Claretta Skene      Diabetes Medication Regimen: Glipizide  XL 20 mg daily, jardiance  25 mg daily , Lantus  12 units daily at night (not taking lantus  at present), taking Lantus  only if HS glucose is 170 or above. Did not try jardiance .     Prior medications: Unable to tolerate Metformin  due to diarrhea, unable to tolerate Victoza  due to n/v, invokana . Levemir  (caused nausea and vomiting), Tresiba  (did not tolerate, not sure reason for intolerance)     Taking potassium pill daily along with eating sweet potato and avocado everyday.     Home glucose readings:  Today fasting blood glucose was 206 mg/dl.        Podiatric assessment: admits LE burning/tingling due to hx of stroke   Exercise:  walks occasionally   Diet: moderate compliance with recommended diet;   Response to therapy: with hemoglobin A1C above goal  A1c trend: improving     Lab Results   Component Value Date    HGBA1C 9.7 (H) 09/01/2023    HGBA1C 9.4 (H) 05/30/2023    HGBA1C 9.2 (H) 02/27/2023     Trend is above goal  Additional concerns: In addition, pt has hyperlipidemia and is compliant with lipid therapy.  Pt denies side effects of lipid therapy - specifically denies myalgia., In addition, pt has stable essential HTN with no evidence of CHF or proteinuria.  The patient is compliant with anti-hypertensive therapy and denies side effects to therapy.  Pt denies CP, SOB, dizziness, orthopnea, PND or edema.     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     INTERVAL HISTORY  Elements of HPI were reviewed and updated. Did not receive lantus  from pharmacy since 9/24 which resulted in higher blood glucose readings.    Hypertension  BP significantly elevated  - no evidence of CHF, denies chest pain, heart palpitations or fluid retention      24h diet recall:  BF- 1/2 avocado, 1 whole grain toast, 1 cup black tea  Lunch-cauliflower pizza, 1 apple  Dinner/ Snack: chicken sandwich with whole wheat bread.    Recently diagnosed with lupus in 2/24. Reports increase in stress related to death in her family. Notes side effects with repatha . S/p cholecystectomy on 5/28. Reports increased stress related to health and finances. Told that she cannot proceed with knee surgery due to higher A1c.    Taking only 2 meals per day. Reports compliance with jardiance , lantus . States that immediate release glipizide  is not effective, blood glucose readings remain high.    ROS:  A complete 12 point ROS was obtained. Pertinent positives and negatives as noted in HPI. All other systems are negative.      Vitals:  BP (!) 151/96 (BP Site: Left arm, Patient Position: Sitting, Cuff Size: Large)   Pulse 74   Temp  97.1 F (36.2 C) (Temporal)   Resp 18   Ht 1.676 m (5' 6)   Wt 90.1 kg (198 lb 9.6 oz)   SpO2 98%   BMI 32.05 kg/m    Wt Readings from Last 3 Encounters:   11/20/23 90.1 kg (198 lb 9.6 oz)   09/01/23 91.6 kg (202 lb)   07/17/23 90.8 kg (200 lb 3.2 oz)     BP is high today 162/95 repeat measurement. Patient states that she just took her BP medication.      Physical Exam   Constitutional: She is oriented to person, place, and time. She appears well-developed and well-nourished.   HENT:   Head: Normocephalic and atraumatic.   Mouth/Throat: Oropharynx is clear and moist.   Eyes: Conjunctivae and EOM are normal.    Neck: Normal range of motion. No thyromegaly present.   Neurological: She is alert and oriented to person, place, and time.   Psychiatric: She  has a normal mood and affect.   Normal sensation on 10g monofilament testing RLE, slightly decreased sensation LLE.        Assessment and Plan     Diagnoses and plan were reviewed and updated    1. Uncontrolled type 2 diabetes mellitus with hyperglycemia  -   Lab Results   Component Value Date    HGBA1C 9.7 (H) 09/01/2023    HGBA1C 9.4 (H) 05/30/2023    HGBA1C 9.2 (H) 02/27/2023       Current A1C is elevated above goal.  -  Recommend increasing the dose of SGLT-2 Inhibitor therapy.  Side effects of SGLT2 inhibitor (UTI, mycotic infection, polyuria, change in renal function, hypotension) discussed with patient.  -  Recommend home glucometer testing twice a day (pre-breakfast or 2 hrs post-prandial).  Risk & Benefits of the new medication(s) were explained to the patient (and family) who verbalized understanding & agreed to the treatment plan. Patient (family) encouraged to contact me/clinical staff with any questions/concerns  Reports good glycemic control when switching from tradjenta  to jardiance .  -  recommend carb consistent diet and stay physically active as tolerated  - referred to CDE for diet counseling, new referral entered, prefers in person appointment.    Completed diabetes education group classes 3 years ago and requesting 1:1 support for education.    Discussed using CGM to improve blood glucose monitoring and medication adjustment.    Recommend increasing lantus  to 14 units at bedtime. Target fasting blood sugars 100-150 mg/dl.  Discontinue glipizide .  Recommend initiation of prandin  1 mg 30 min prior to meals x2/ day prior to lunch and dinner. Elected to start on prandin  instead of bolus insulin .    - empagliflozin  (JARDIANCE ) 10 MG tablet; Take 1 tablet (10 mg) by mouth every morning  Dispense: 90 tablet; Refill: 1  - Urine Microalbumin Random  - Hemoglobin A1C  - Comprehensive metabolic panel  - TSH  - Ambulatory referral to Diabetic Education; Future  - Urine Microalbumin Random; Future  -  Hemoglobin A1C; Future  - Comprehensive metabolic panel; Future  - Lipid panel; Future    2. Obesity due to excess calories (BMI 32-32.9)  - Recommend calorie restricted diet and exercise 150 min/week combined cardio and strength training   - patient has hx of intolerance to glp-1 ra   - referred to CDE     3. Mixed hyperlipidemia  Lab Results   Component Value Date    CHOL 198 09/01/2023    CHOL 177 05/30/2023  CHOL 239 (H) 02/27/2023     Lab Results   Component Value Date    HDL 46 09/01/2023    HDL 50 05/30/2023    HDL 49 02/27/2023     Lab Results   Component Value Date    LDL 129 (H) 09/01/2023    LDL 105 05/30/2023    LDL 831 (H) 02/27/2023     Lab Results   Component Value Date    TRIG 114 09/01/2023    TRIG 110 05/30/2023    TRIG 112 02/27/2023       - LDL is elevated  - hx of taking Repatha , reports he stopped Repatha  because it has increased sugars  On Rosuvastatin  40 mg QHS at present.    - rosuvastatin  (CRESTOR ) 40 MG tablet; Take 1 tablet (40 mg) by mouth daily  Dispense: 90 tablet; Refill: 1  - Comprehensive metabolic panel  - Lipid panel  - Comprehensive metabolic panel; Future  - Lipid panel; Future    4. Essential hypertension  - per report ambulatory BP is well controlled, has element of white coat HTN.   - on Losartan  100 mg daily. Recommend increasing dose of verapamil  ER to 180 mg daily and continue follow up with primary care physician regarding blood pressure monitoring.    - Comprehensive metabolic panel; Future    5. Long-term insulin  use    This visit is being conducted via video and telephone.      RTC in 3-4 months with me and F/U with Nanetta in 6 weeks (will need A1c checked during f/u with Nanetta, NP    Joneen Claretta Skene MD MPH  Endocrinologist, [facility]

## 2023-11-21 NOTE — Addendum Note (Signed)
Addended by: Pecola Lawless on: 11/21/2023 06:51 AM     Modules accepted: Orders

## 2023-11-22 ENCOUNTER — Encounter (HOSPITAL_BASED_OUTPATIENT_CLINIC_OR_DEPARTMENT_OTHER): Payer: Self-pay | Admitting: Internal Medicine

## 2023-11-22 NOTE — Telephone Encounter (Signed)
 Writer returned the call center message received from Ciro) [facility] in regards to [name]' Jardiance  prescription. I spoke with rep Merwyn) explaining that I was returning a message from one of her colleagues regarding a denial for [name]' Jardiance  prescription. Briannia stated that [name] called her insurance initiating a request to lower the drug tier because it is too expensive. That request was denied citing that the drug is covered but cannot be lowered to because there isn't a generic only brand. Writer voiced understanding and notified provider to advise.      Emily Ruth, LPN II  0:44 am

## 2023-11-25 ENCOUNTER — Encounter (INDEPENDENT_AMBULATORY_CARE_PROVIDER_SITE_OTHER): Payer: Self-pay

## 2023-11-27 ENCOUNTER — Encounter (HOSPITAL_BASED_OUTPATIENT_CLINIC_OR_DEPARTMENT_OTHER): Payer: Self-pay | Admitting: Internal Medicine

## 2023-11-27 NOTE — Progress Notes (Signed)
 Emily Rowe stopped by with a Jardiance  form for Dr. Claretta Skene to complete. Patient stated that she had to stop the Prandin  (yesterday- 11/26/23) that was prescribed because of itching and stomach pain. She has increase the Lantus  to 14 units as instructed. Writer asked patient to send her blood sugar readings when she gets back home to determine if Dr. Claretta Skene will need to add insulin  to her regimen, aside from the Lantus . Patient verbalized her understanding. Provider notified.        Reena Ruth, LPN II  88:68 am

## 2023-11-28 ENCOUNTER — Encounter (HOSPITAL_BASED_OUTPATIENT_CLINIC_OR_DEPARTMENT_OTHER): Payer: Self-pay

## 2023-11-29 ENCOUNTER — Encounter (HOSPITAL_BASED_OUTPATIENT_CLINIC_OR_DEPARTMENT_OTHER): Payer: Self-pay | Admitting: Internal Medicine

## 2023-12-05 ENCOUNTER — Ambulatory Visit (INDEPENDENT_AMBULATORY_CARE_PROVIDER_SITE_OTHER): Payer: Self-pay

## 2023-12-05 ENCOUNTER — Encounter (INDEPENDENT_AMBULATORY_CARE_PROVIDER_SITE_OTHER): Payer: Self-pay

## 2023-12-05 ENCOUNTER — Ambulatory Visit (INDEPENDENT_AMBULATORY_CARE_PROVIDER_SITE_OTHER): Payer: Medicare Other

## 2023-12-05 VITALS — BP 158/98 | HR 68 | Temp 97.5°F | Ht 64.0 in | Wt 205.2 lb

## 2023-12-05 DIAGNOSIS — E1165 Type 2 diabetes mellitus with hyperglycemia: Secondary | ICD-10-CM

## 2023-12-05 DIAGNOSIS — Z0000 Encounter for general adult medical examination without abnormal findings: Secondary | ICD-10-CM

## 2023-12-05 DIAGNOSIS — E119 Type 2 diabetes mellitus without complications: Secondary | ICD-10-CM

## 2023-12-05 DIAGNOSIS — Z1231 Encounter for screening mammogram for malignant neoplasm of breast: Secondary | ICD-10-CM

## 2023-12-05 DIAGNOSIS — I10 Essential (primary) hypertension: Secondary | ICD-10-CM

## 2023-12-05 DIAGNOSIS — E782 Mixed hyperlipidemia: Secondary | ICD-10-CM

## 2023-12-05 DIAGNOSIS — Z23 Encounter for immunization: Secondary | ICD-10-CM

## 2023-12-05 LAB — LAB USE ONLY - CBC WITH DIFFERENTIAL
Absolute Basophils: 0.05 10*3/uL (ref 0.00–0.08)
Absolute Eosinophils: 0.09 10*3/uL (ref 0.00–0.44)
Absolute Immature Granulocytes: 0.01 10*3/uL (ref 0.00–0.07)
Absolute Lymphocytes: 1.83 10*3/uL (ref 0.42–3.22)
Absolute Monocytes: 0.38 10*3/uL (ref 0.21–0.85)
Absolute Neutrophils: 3.72 10*3/uL (ref 1.10–6.33)
Absolute nRBC: 0 10*3/uL (ref <=0.00)
Basophils %: 0.8 %
Eosinophils %: 1.5 %
Hematocrit: 41.5 % (ref 34.7–43.7)
Hemoglobin: 12.9 g/dL (ref 11.4–14.8)
Immature Granulocytes %: 0.2 %
Lymphocytes %: 30.1 %
MCH: 27.7 pg (ref 25.1–33.5)
MCHC: 31.1 g/dL — ABNORMAL LOW (ref 31.5–35.8)
MCV: 89.2 fL (ref 78.0–96.0)
MPV: 13.9 fL — ABNORMAL HIGH (ref 8.9–12.5)
Monocytes %: 6.3 %
Neutrophils %: 61.1 %
Platelet Count: 143 10*3/uL (ref 142–346)
Preliminary Absolute Neutrophil Count: 3.72 10*3/uL (ref 1.10–6.33)
RBC: 4.65 10*6/uL (ref 3.90–5.10)
RDW: 13 % (ref 11–15)
WBC: 6.08 10*3/uL (ref 3.10–9.50)
nRBC %: 0 /100{WBCs} (ref <=0.0)

## 2023-12-05 LAB — LIPID PANEL
Cholesterol / HDL Ratio: 4.1 {index}
Cholesterol: 207 mg/dL — ABNORMAL HIGH (ref <=199)
HDL: 51 mg/dL (ref >=40)
LDL Calculated: 129 mg/dL — ABNORMAL HIGH (ref 0–99)
Triglycerides: 136 mg/dL (ref 34–149)
VLDL Calculated: 27 mg/dL (ref 10–40)

## 2023-12-05 LAB — COMPREHENSIVE METABOLIC PANEL
ALT: 21 U/L (ref <=55)
AST (SGOT): 21 U/L (ref <=41)
Albumin/Globulin Ratio: 1.2 (ref 0.9–2.2)
Albumin: 3.7 g/dL (ref 3.5–5.0)
Alkaline Phosphatase: 111 U/L (ref 37–117)
Anion Gap: 10 (ref 5.0–15.0)
BUN: 13 mg/dL (ref 7–21)
Bilirubin, Total: 0.5 mg/dL (ref 0.2–1.2)
CO2: 25 meq/L (ref 17–29)
Calcium: 9 mg/dL (ref 7.9–10.2)
Chloride: 104 meq/L (ref 99–111)
Creatinine: 0.7 mg/dL (ref 0.4–1.0)
GFR: >60 mL/min/{1.73_m2} (ref >=60.0)
Globulin: 3.2 g/dL (ref 2.0–3.6)
Glucose: 241 mg/dL — ABNORMAL HIGH (ref 70–100)
Hemolysis Index: 15 {index}
Potassium: 4.2 meq/L (ref 3.5–5.3)
Protein, Total: 6.9 g/dL (ref 6.0–8.3)
Sodium: 139 meq/L (ref 135–145)

## 2023-12-05 LAB — HEMOGLOBIN A1C
Average Estimated Glucose: 257.5 mg/dL
Hemoglobin A1C: 10.6 % — ABNORMAL HIGH (ref 4.6–5.6)

## 2023-12-05 NOTE — Progress Notes (Addendum)
 [facility]   Medicare Wellness Visit                 Emily Rowe is a 73 y.o. female who presents today for the following Medicare Wellness Visit:  []  Initial Preventive Physical Exam (IPPE) - Welcome to Medicare preventive visit (Vision Screening required)   []  Annual Wellness Visit - Initial  [x]  Annual Wellness Visit - Subsequent                                                                                                                                                 Health Risk Assessment   During the past month, how would you rate your general health?:  Poor  Which of the following tasks can you do without assistance - drive or take the bus alone; shop for groceries or clothes; prepare your own meals; do your own housework/laundry; handle your own finances/pay bills; eat, bathe or get around your home?: Drive or take the bus alone, Shop for groceries or clothes, Prepare your own meals, Do your own housework/laundry, Handle your own finances/pay bills, Eat, bathe, dress or get around your home  Which of the following problems have you been bothered by in the past month - dizzy when standing up; problems using the phone; feeling tired or fatigued; moderate or severe body pain?: Dizzy when standing up, Feeling tired or fatigued, Moderate or severe body pain  Do you exercise for about 20 minutes 3 or more days per week?:Yes  During the past month was someone available to help if you needed and wanted help?  For example, if you felt nervous, lonely, got sick and had to stay in bed, needed someone to talk to, needed help with daily chores or needed help just taking care of yourself.: Yes  Do you always wear a seat belt?: Yes  Do you have any trouble taking medications the way you have been told to take them?: No  Have you been given any information that can help you with keeping track of your medications?: No  Do you have trouble paying for your medications?:    Have you been given any information  that can help you with hazards in your house, such as scatter rugs, furniture, etc?: No  Do you feel unsteady when standing or walking?: Yes  Do you worry about falling?: Yes  Have you fallen two or more times in the past year?: Yes  Did you suffer any injuries from your falls in the past year?: No    Patient is a very pleasant 73 year old female here today for her AWV. She is Fasting.    Her BP is elevated in the clinic today but she states this is always her average. She is asymptomatic and denies chest pain or other concerning symptoms. She sees cardiologist Dr. Julio and  she states he is aware of her blood pressure numbers.     She declined rechecking it during her appointment today as she states this number is normal for her.    She reports she stopped taking her simvastatin  about 1.5 months ago due to stomach upset. This occurred after getting her gallbladder removed last May. She is now taking an OTC natural cholesterol medication the past few days but does not recall the name. She reports she is tolerating it well.    She reports difficulty getting her jardiance  prescription filled due to insurance not wanting to cover it and her endo office delaying the prior authorization of this. She is very frustrated about this as she states she needs the medication since her A1C was very elevated when it was checked last.    Urine Microalbumin done on 09/01/23.    She is a non-smoker and does not drink alcohol.    Last mammogram was on Nov 29 2022 and was normal.    Last dexa scan was on Dec 24 2022.    Last colorectal cancer screening was on Sep 17 2020 with Dr. Marvis. No polyps were removed. She reports that her colon was perforated with the colonoscopy prior to this last one.    She goes to the eye doctor and is UTD on her eye exams. She reports the pressure in her eyes is elevated but she does not have glaucoma.    Vaccines partly UTD but has not recieved flu vaccine this year.  She also is contemplating the  shingrix vaccine. Tetanus is now due as well.    She does not see the dermatologist regularly.     She does not have an advance directive.    Hep C Ab negative on 09/05/19.    The patient exercises regularly, 3-4 times per week. , Type of exercise:  walking and chair exercises.    Her diet is Well-balanced, all food groups.    Hospitalizations   Hospitalization within past year: [x]  No  []  Yes     Diagnosis:    Screenings         02/25/2022 11/17/2022 12/05/2023   Ambulatory Screenings   Falls Risk: Emily Rowe more than 2 times in past year N N    N Y   Falls Risk: Suffer any injuries? N N    N N   Depression: PHQ2 Total Score  0 6   Depression: PHQ9 Total Score  0 6       Multiple values from one day are sorted in reverse-chronological order        Substance Use Disorder Screen:  In the past year, how often have you used the following?  1) Alcohol (For men, 5 or more drinks a day. For women, 4 or more drinks a day)  [x]  Never []  Once or Twice []  Monthly []  Weekly []  Daily or Almost Daily  2) Tobacco Products  [x]  Never []  Once or Twice []  Monthly []  Weekly []  Daily or Almost Daily  3) Prescription Drugs for Non-Medical Reasons  [x]  Never []  Once or Twice []  Monthly []  Weekly []  Daily or Almost Daily  4) Illegal Drugs  [x]  Never []  Once or Twice []  Monthly []  Weekly []  Daily or Almost Daily      Functional Ability/Level of Safety   Falls Risk/Home Safety Assessment:  (see HRA and Screenings sections for additional assessment)    Home Safety:   []  Low Risk for Falls  [x]   Skid-resistant rugs/remove throw rugs   []  Grab bars    [x]  Clear pathways between rooms    [x]  Proper lighting stairs/ bathrooms/bedrooms    Get Up and Go (optional):    []   <20 secs    []   >20 secs    [x]   High risk for falls - Home Safety/Falls Risk Precautions reviewed with pt/family    Hearing Assessment:  Concerns for hearing loss: []  Yes  []   No  Hearing aids:   []   Right  []   Left  []   Bilateral   []   None  Whisper Test (optional):  []  Normal  []    Slightly decreased  []   Significantly decreased    Visual Acuity:  Uncorrected Far Vision  Right Eye: 20/70  Left Eye: 20/200  Both Eyes: 20/70    Exercise:  Frequency:  []   No formal exercise  []   1-2x/wk  [x]   3-4x/wk  []   >4x/wk  Walks and does some chair exercises. 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  Duration:  []   15-30 mins/day  []   30-45 mins/day  []   45+ mins/day  Intensity:  [x]   Light  []   Moderate  []   Heavy    Diet:  Diet: - consumes a well balanced diet  - moderate compliance with heart healthy and well balanced diet      Activities of Daily Living     ADL's Independent Minimal  Assistance Moderate  Assistance Total   Assistance   Bathing [x]  []  []  []    Dressing [x]  []  []  []    Mobility   [x]  []  []  []    Transfer [x]  []  []  []    Eating [x]  []  []  []    Toileting [x]  []  []  []      IADL's Independent Minimal  Assistance Moderate  Assistance Total   Assistance   Phone [x]  []  []  []    Housekeeping [x]  []  []  []    Laundry [x]  []  []  []    Transportation [x]  []  []  []    Medications [x]  []  []  []    Finances [x]  []  []  []       ADL assistance:   [x]  No assistance needed    []  Spouse    []  Sibling    []  Son   []  Daughter   []  Children    []  Home Health Aide   []  Other:    Social Activities/Engagement   Frequency of Communication with Friends and Family:  []  Never []  Rarely []  Sometimes []  Often[]  Very often    Frequency of Social Gatherings with Friends and Family:   []  Never []  Rarely []  Sometimes []  Often[]  Very often    Advanced Care Planning   Discussion of Advance Directives:   []  Advance Directive in chart    []  Advance Directive not in chart - requested to provide   [x]  No Advance Directive.  Form Provided    []  No Advance Directive.  Pt declines.   []  Not addressed today    []  Other:      Exam   BP (!) 158/98 (BP Site: Left arm, Patient Position: Sitting, Cuff Size: Large)   Pulse 68   Temp 97.5 F (36.4 C) (Oral)   Ht 1.626 m (5' 4)   Wt 93.1 kg (205 lb 4 oz)   SpO2 95%   BMI 35.23 kg/m   Physical  Exam      Evaluation of Cognitive Function   Mood/affect: [x]  Appropriate  []   Other:   Appearance: [  x] Neatly groomed  [x]  Adequately nourished  []  Other:  Family member/caregiver input: []  Present - no concerns  []   Not present in room  []  Present - concerns:    Cognitive Assessment:  Mini-Cog Result (three word registration- banana, sunrise, chair / clock drawing):   [x]   > 3 points - negative screen for dementia   []  3 recalled words - negative screen for dementia   [x]  1-2 recalled words and normal clock draw - negative for cognitive impairment  Recalled banana   []  1-2 recalled words and abnormal clock draw - positive for cognitive impairment   []  0 recalled words - positive for cognitive impairment       Personalized Prevention Plan   The patient was provided with a personalized prevention plan via   a printed copy  Patient Instructions   Women's Preventive Wellness Plan  Today's Date: December 05, 2023    Patient Name:Emily Rowe    Date of Birth: February 05, 1951     As part of your wellness benefit, Medicare makes many screening tests available to you at no charge.  A complete list of these tests can be found at their website, [email or web address]. However, many of these tests or recommendations are out of date, or may not apply to you. After careful consideration of your own personal health needs, the following testing is recommended for you:      Preventive Service    Up-to-date (UTD)/Due/Not Applicable (N/A)   Last Done   Medicare Frequency   Body Mass Index   Up-to-date December 05, 2023  (BMI):Body mass index is 35.23 kg/m.   Height:Height: 162.6 cm (5' 4)  Weight:Weight: 93.1 kg (205 lb 4 oz) Annually   Blood Pressure: Up-to-date December 05, 2023    BP: (!) 158/98   Every 2 yrs, if BP </= 120/80 mm hg  Annually, if BP >120-139/80-89 mm hg   Cholesterol Testing Due Lab Results   Component Value Date    LDL 129 (H) 09/01/2023     Regularly  beginning at age 81 with risk factors   Diabetes Screening Due Lab Results   Component Value Date    GLU 178 (H) 09/01/2023      If prediabetes, one screening every 6 months  Otherwise, one screening every 12 months with certain risk factors for diabetes   Osteoporosis Screening   (Bone Density Measurement)  Up-to-date 12/24/22- normal Routinely, for women aged 65+  Routinely, for women aged 60-64 with risk factors   Breast Cancer Screening   (Mammogram) Due 11/29/22-normal Every 2 yrs, aged 82-74 yrs   Cervical Cancer Screening   (Pap Smear)  Not medically indicated  Annually if at high risk for developing cervical or vaginal cancer or childbearing age with abnormal Pap test within past 3 years; Every 2 years for women at normal risk  HPV: Once every 5 years; All asymptomatic female Medicare beneficiaries aged 60 to 61 years   Colorectal Cancer Screening Up-to-date 09/17/20. Advised to repeat in 10 years Annually, Fecal Occult Blood Stool (FOBS)  Every 5 yrs, Sigmoidoscopy with FOBS  Every 10 yrs, Colonoscopy  Every 3 yrs, Cologuard   Depression Screening Up-to-date December 05, 2023  As necessary for those with risk factors   Sexually Transmitted Diseases (STDs) & HIV Screening Not applicable  As necessary for those with risk factors   Alcohol Misuse Screening Not applicable  As necessary for those with risk factors   Immunizations:   Tdap is due and  flu vaccine. Pt declines covid vaccine at this time due to reaction in the past from it. Immunization History   Administered Date(s) Administered    COVID-19 mRNA MONOVALENT vaccine BOOSTER 18 years and above (Moderna) 50 mcg/0.25 mL 09/27/2020    COVID-19 mRNA MONOVALENT vaccine PRIMARY SERIES 12 years and above (Moderna) 100 mcg/0.5 mL 12/25/2019, 01/22/2020    INFLUENZA TRIVALENT PRESERVED MD 6 MO AND OLDER 07/05/2007    Influenza vaccine quadrivalent high-dose 65 years and older (FLUZONE  HIGH-DOSE) single dose 0.7 mL (PF) 08/24/2020, 11/02/2021, 08/09/2022     Influenza vaccine quadrivalent recombinant 18 years and older (FLUBOK) single dose 0.5 mL (PF) 08/13/2019    Pneumococcal Conjugate 13-Valent 12/30/2020    Pneumococcal polysaccharide vaccine, 23 valent 09/05/2019    Td vaccine, tetanus and diphtheria toxoids, adsorbed, PF, adult, (2-2 Lf) 08/30/1995    Tdap (tetanus, diphtheria reduced, acellular pertussis), adsorbed vaccine 07/05/2007    Tetanus toxoid, adsorbed 09/04/2012    Prevnar 13: 1 dose after age 36  Pneumovax 56: 1 dose 1 year after Prevnar  Influenza: Annually   Advance Directive Advanced directive form given to patient  Once; update as needed   Medical Nutrition Therapy Due  As necessary for diabetes or renal disease   Smoking Cessation Counseling Not applicable Counseling given: Not Answered   Frequency: two cessation attempts per year.   Glaucoma Screening Up-to-date Sees two eye doctors. Last eye exam was 1/25. Annually for covered high risk Medicare beneficiaries (one of the following: DM, FHx Glaucoma, African-Americans aged 51+, Hispanic-Americans aged 65+)   Hepatitis C Virus (HCV) Screening Up-to-date 09/05/19- normal Annually only for high risk behavior  Once if born between 1945 and 1965 and are not considered high risk   Lung Cancer Screening Not applicable  Annually if asymptomatic, tobacco smoking history of at least 30 pack-years (one pack-year = smoking one pack per day for one year; 1 pack = 20 cigarettes), and current smoker or one who has quit smoking within the last 15 years     Your major risk factors:       Diabetes, Hypertension, Obesity, Falls Risk, and Sedentary lifestyle and hyperlipidemia     Recommendations for improvement:    Low cholesterol diet, Exercise, Weight management, Compliance with prescription medications, and Low carb diet     Referrals:    See After Visit Summary orders                                                                                                                                                       Assessment/Plan     Assessment & Plan  Encounter for subsequent annual wellness visit in Medicare patient    Fasting labs collected today.    Pt has follow up with Dr. Anbarasan scheduled for 2/28.    She will contact  her endo's office regarding the Jardiance  to follow up.    Advance directive form provided.  Breast cancer screening by mammogram    Mammogram is due and ordered. Pt provided with copy. She will call to schedule.    Routine general medical examination at a health care facility    Need for vaccination    Orders:    Flu vaccine TRIVALENT, 65 yrs and older (FLUZONE  HIGH-DOSE) single-dose PF, 0.5 mL    Mixed hyperlipidemia    Orders:    Comprehensive Metabolic Panel; Future    CBC with Differential (Order); Future    Lipid Panel; Future    Hemoglobin A1C; Future    Essential hypertension    Orders:    Comprehensive Metabolic Panel; Future    CBC with Differential (Order); Future    Lipid Panel; Future    Hemoglobin A1C; Future    Uncontrolled type 2 diabetes mellitus with hyperglycemia    Orders:    Comprehensive Metabolic Panel; Future    CBC with Differential (Order); Future    Lipid Panel; Future    Hemoglobin A1C; Future          History/Care Team   Patient Care Team:  Anbarasan, Malarmathi T, MD as PCP - General (Family Medicine)  Alethea Evone RAMAN, MD  Julio Camellia BIRCH, MD as Consulting Physician (Interventional Cardiology)  Elaine Bernarda SAUNDERS, PA as Physician Assistant (Physician Assistant)  Marvis Waunita RAMAN, MD as Consulting Physician (Gastroenterology)  Claretta Melanee Ransom, MD as Consulting Physician (Endocrinology, Diabetes and Metabolism)  [x]  Medical, surgical, family history reviewed and updated during this encounter  [x]  Medication list updated and reconciled during this encounter    Additional Documentation

## 2023-12-05 NOTE — Assessment & Plan Note (Signed)
 Orders:    Comprehensive Metabolic Panel; Future    CBC with Differential (Order); Future    Lipid Panel; Future    Hemoglobin A1C; Future

## 2023-12-05 NOTE — Patient Instructions (Signed)
 Women's Preventive Wellness Plan  Today's Date: December 05, 2023    Patient Name:Emily Rowe    Date of Birth: 1950/11/22     As part of your wellness benefit, Medicare makes many screening tests available to you at no charge.  A complete list of these tests can be found at their website, [email or web address]. However, many of these tests or recommendations are out of date, or may not apply to you. After careful consideration of your own personal health needs, the following testing is recommended for you:      Preventive Service    Up-to-date (UTD)/Due/Not Applicable (N/A)   Last Done   Medicare Frequency   Body Mass Index   Up-to-date December 05, 2023  (BMI):Body mass index is 35.23 kg/m.   Height:Height: 162.6 cm (5' 4)  Weight:Weight: 93.1 kg (205 lb 4 oz) Annually   Blood Pressure: Up-to-date December 05, 2023    BP: (!) 158/98   Every 2 yrs, if BP </= 120/80 mm hg  Annually, if BP >120-139/80-89 mm hg   Cholesterol Testing Due Lab Results   Component Value Date    LDL 129 (H) 09/01/2023     Regularly beginning at age 2 with risk factors   Diabetes Screening Due Lab Results   Component Value Date    GLU 178 (H) 09/01/2023      If prediabetes, one screening every 6 months  Otherwise, one screening every 12 months with certain risk factors for diabetes   Osteoporosis Screening   (Bone Density Measurement)  Up-to-date 12/24/22- normal Routinely, for women aged 65+  Routinely, for women aged 60-64 with risk factors   Breast Cancer Screening   (Mammogram) Due 11/29/22-normal Every 2 yrs, aged 69-74 yrs   Cervical Cancer Screening   (Pap Smear)  Not medically indicated  Annually if at high risk for developing cervical or vaginal cancer or childbearing age with abnormal Pap test within past 3 years; Every 2 years for women at normal risk  HPV: Once every 5 years; All asymptomatic female Medicare beneficiaries aged 80 to 78 years   Colorectal Cancer  Screening Up-to-date 09/17/20. Advised to repeat in 10 years Annually, Fecal Occult Blood Stool (FOBS)  Every 5 yrs, Sigmoidoscopy with FOBS  Every 10 yrs, Colonoscopy  Every 3 yrs, Cologuard   Depression Screening Up-to-date December 05, 2023  As necessary for those with risk factors   Sexually Transmitted Diseases (STDs) & HIV Screening Not applicable  As necessary for those with risk factors   Alcohol Misuse Screening Not applicable  As necessary for those with risk factors   Immunizations:   Tdap is due and flu vaccine. Pt declines covid vaccine at this time due to reaction in the past from it. Immunization History   Administered Date(s) Administered   . COVID-19 mRNA MONOVALENT vaccine BOOSTER 18 years and above (Moderna) 50 mcg/0.25 mL 09/27/2020   . COVID-19 mRNA MONOVALENT vaccine PRIMARY SERIES 12 years and above (Moderna) 100 mcg/0.5 mL 12/25/2019, 01/22/2020   . INFLUENZA TRIVALENT PRESERVED MD 6 MO AND OLDER 07/05/2007   . Influenza vaccine quadrivalent high-dose 65 years and older (FLUZONE  HIGH-DOSE) single dose 0.7 mL (PF) 08/24/2020, 11/02/2021, 08/09/2022   . Influenza vaccine quadrivalent recombinant 18 years and older (FLUBOK) single dose 0.5 mL (PF) 08/13/2019   . Pneumococcal Conjugate 13-Valent 12/30/2020   . Pneumococcal polysaccharide vaccine, 23 valent 09/05/2019   . Td vaccine, tetanus and diphtheria toxoids, adsorbed, PF, adult, (2-2 Lf) 08/30/1995   . Tdap (  tetanus, diphtheria reduced, acellular pertussis), adsorbed vaccine 07/05/2007   . Tetanus toxoid, adsorbed 09/04/2012    Prevnar 13: 1 dose after age 11  Pneumovax 21: 1 dose 1 year after Prevnar  Influenza: Annually   Advance Directive Advanced directive form given to patient  Once; update as needed   Medical Nutrition Therapy Due  As necessary for diabetes or renal disease   Smoking Cessation Counseling Not applicable Counseling given: Not Answered   Frequency: two cessation attempts per year.   Glaucoma Screening Up-to-date Sees two  eye doctors. Last eye exam was 1/25. Annually for covered high risk Medicare beneficiaries (one of the following: DM, FHx Glaucoma, African-Americans aged 33+, Hispanic-Americans aged 65+)   Hepatitis C Virus (HCV) Screening Up-to-date 09/05/19- normal Annually only for high risk behavior  Once if born between 1945 and 1965 and are not considered high risk   Lung Cancer Screening Not applicable  Annually if asymptomatic, tobacco smoking history of at least 30 pack-years (one pack-year = smoking one pack per day for one year; 1 pack = 20 cigarettes), and current smoker or one who has quit smoking within the last 15 years     Your major risk factors:       Diabetes, Hypertension, Obesity, Falls Risk, and Sedentary lifestyle and hyperlipidemia     Recommendations for improvement:    Low cholesterol diet, Exercise, Weight management, Compliance with prescription medications, and Low carb diet     Referrals:    See After Visit Summary orders

## 2023-12-09 ENCOUNTER — Telehealth (HOSPITAL_BASED_OUTPATIENT_CLINIC_OR_DEPARTMENT_OTHER): Payer: Self-pay | Admitting: Internal Medicine

## 2023-12-09 ENCOUNTER — Telehealth (INDEPENDENT_AMBULATORY_CARE_PROVIDER_SITE_OTHER): Payer: Self-pay | Admitting: Family Medicine

## 2023-12-09 DIAGNOSIS — E1165 Type 2 diabetes mellitus with hyperglycemia: Secondary | ICD-10-CM

## 2023-12-09 NOTE — Telephone Encounter (Signed)
 Please call and let the patient know given recent lab results with worsening A1c, inability to tolerate multiple oral medications, recommend starting on humalog  5 units prior to meals x3/ day. Please confirm after you speak to patient and I will send the prescription for short acting insulin . Have we received any additional information if tier exception for jardiance  will be approved? Thanks

## 2023-12-09 NOTE — Telephone Encounter (Signed)
 See labs uncontrolled diabetes patient having issues getting her medication due to detectable.  Sending secure chat to her endocrinologist.  Placing a referral for nurse navigator to assist patient with her current needs.

## 2023-12-11 ENCOUNTER — Other Ambulatory Visit: Payer: Self-pay

## 2023-12-11 NOTE — Addendum Note (Signed)
 Addended by: Caryn Bee on: 12/11/2023 10:01 AM     Modules accepted: Orders

## 2023-12-11 NOTE — Progress Notes (Signed)
 Care Plan      Name: [name]     MRN: [id number]       DOB:   [birth year]     PCP: Anbarasan, Malarmathi T, MD      Advance Directives:  N     Communication Preferences: Phone and mychart    Diagnosis:  Problem List[1]    Patient Care Team:  Anbarasan, Malarmathi T, MD as PCP - General (Family Medicine)  Emily Evone RAMAN, MD as Consulting Physician (Neurology)  Emily Camellia BIRCH, MD as Consulting Physician (Interventional Cardiology)  Emily Bernarda JONELLE, PA as Physician Assistant (Physician Assistant)  Emily Waunita RAMAN, MD as Consulting Physician (Gastroenterology)  Emily Melanee Ransom, MD as Consulting Physician (Endocrinology, Diabetes and Metabolism)  Emily Domino, RN as Nurse Navigator (Ambulatory Medicine)    Health Maintenance   Topic Date Due    Advance Directive on File  Never done    Shingrix Vaccine 50+ (1) Never done    Tetanus Ten-Year  09/04/2022    MAMMOGRAM  11/30/2023    Statin Use  12/16/2023    URINE MICROALBUMIN  08/31/2024    OPHTHALMOLOGY EXAM  10/23/2024    FALLS RISK ANNUAL  12/04/2024    DEPRESSION SCREENING  12/04/2024    HEMOGLOBIN A1C ANNUAL  12/04/2024    Medicare Annual Wellness Visit  12/04/2024    DXA Scan  12/23/2024    Colorectal Cancer Screening  09/17/2030    HEPATITIS C SCREENING  Completed    INFLUENZA VACCINE  Completed    Pneumonia Vaccine Age 2+  Completed    COVID-19 Vaccine  Discontinued         Immunization History   Administered Date(s) Administered    COVID-19 mRNA MONOVALENT vaccine BOOSTER 18 years and above (Moderna) 50 mcg/0.25 mL 09/27/2020    COVID-19 mRNA MONOVALENT vaccine PRIMARY SERIES 12 years and above (Moderna) 100 mcg/0.5 mL 12/25/2019, 01/22/2020    INFLUENZA TRIVALENT HIGH DOSE PF 65 YRS AND OLDER (FLUZONE  HIGH-DOSE) 12/05/2023    INFLUENZA TRIVALENT PRESERVED MD 6 MO AND OLDER 07/05/2007    Influenza vaccine quadrivalent high-dose 65 years and older (FLUZONE  HIGH-DOSE) single dose 0.7 mL (PF)  08/24/2020, 11/02/2021, 08/09/2022    Influenza vaccine quadrivalent recombinant 18 years and older (FLUBOK) single dose 0.5 mL (PF) 08/13/2019    Pneumococcal Conjugate 13-Valent 12/30/2020    Pneumococcal polysaccharide vaccine, 23 valent 09/05/2019    Td vaccine, tetanus and diphtheria toxoids, adsorbed, PF, adult, (2-2 Lf) 08/30/1995    Tdap (tetanus, diphtheria reduced, acellular pertussis), adsorbed vaccine 07/05/2007    Tetanus toxoid, adsorbed 09/04/2012          Allergies[2]     Current Medications[3]                 12/11/23 1353   General Assessment   Assessment completed with patient   Cognitive Issues No   Patient lives with other  (shares home with spouse but they are separated)   Capacity/Guardianship Issue No   Pain Yes   Severity of pain Chronic  (bad neuropathy)   Housing single family/private residence   Support system other  (children out of state)   Limited Mobility Yes   Limited Mobility List Walking;Standing  (neuropathy)   Transportation issues No   Medication  Yes   Medication Issues Unable to [employer]  Yes   Abuse or neglect No   Substance Abuse No   Housing concerns No   Health Literacy Assessed  Yes   No consistent medical care No   Visual Impairment No   Hearing Impaired No   Religious or cultural barriers to wellness No   Diet - Unable to Tolerate Other   Diet - Type ADA  (can'Rowe always afford what is best to eat)   Exercise Yes   Exercise > type of exercise walking, gardening when she's able, outpatient PT   Exercise Barriers Pain;Social Isolation     Nurse Navigator (NN) called patient to discuss care management support.  Explained role of nurse navigator and case management services.  Patient confirms primary care physician is Dr. Anbarasan and insurance is MSSP.  Patient agrees to enroll in case management and outreach from nurse navigator.      Pt lives in a single family home that she shares with her husband who she is now separated from.   She has 3 children who don'Rowe  live near by.  She has a son in [place], daughter in [place] and daughter in [place].  Pt reports no other support, no friends neighbors or church community.    Pt had a stroke a few years ago.  States after her stroke everyone abandoned me.  She doesn'Rowe have much support as she is separated, children live out of state and she no longer attends church.  She no longer attends church as she is unable to stand for long periods due to stroke/neuropathy and has same concerns with attending senior groups. She performs her ADLs and still drives.     During the spring and summer she used to grow vegetables but unsure if she will be up to it this year due to her neuropathy.  Pt finds joy in gardening and plants.  Pt uses her vegetable garden to supplement her food.  Since she is unsure if she will be able to garden this year she has concerns about having enough food at times.  Used to get help from someone who now needs help themselves, now has concerns about having enough food..      Pt is an insulin  dependent diabetic.  She checks her finger stick 2-3 times a day.  Currently her fasting gluocse is in the 225 range.  She uses 10 u Lantus  BID (rx indicates take 14u BID).  Pt with many allergies (23 listed) and many meds cause rashes.  She did well on Jardiance  but no longer can afford it as it is $795 dollars.      Pt's med list reviewed and updated. She takes her losartan  differently.  Prescribed as take one 50 mg tablet BID, pt takes 2 at bedtime instead. Pt not taking simvastatin  as she doesn'Rowe tolerate it well.  Pt takes ASA 81 mg chewable daily but reports it killing her, bothers her stomach and makes it hard. She is taking an herbal alternative for cholesterol but doesn'Rowe have the name of it readily available.  Hasn'Rowe tried enteric coated ASA. Otherwise pt taking the rest of meds as prescribed.    Pt has prn lorazepam  available, states she has horrible anxiety.  Also has depression but feels that's handled better.       Doesn'Rowe measure her BP but takes her verapamil  and losartan  daily. She does measure when she feels it's up.     Pt reports difficulty with finances, says her name is on the mortgage but it's a long story.  Pt has concerns about not having enough money and enough food.      Goals:  Monitor BG levels as directed.   Fill Jardiance  prescription  Take all medications as directed, monitoring for any allergic reactions.  Continue to be active, wallk daily as recommended  Make healthy food choices practicing the plate method, portion control, and balancing meals   Manage stress and anxiety levels  Follow up with allergist and dermatologist due to patients rashes and frequent drug reactions  Pt will have adequate food available    Education Provided to Meet Goals:  See note    Action Plans:  SW referral- F[facility]isa accepted  Pharm referral-sent for ASA, Jardiance  and simvastatin  concerns  NN will look for allergist and dermatologists near pt for evaluation  NN will provide diabetic education, refer to CDE at Endo as needed      Referrals:  ACM MSW and pharmacist referrals placed    Follow Up:   2/28 Dr. Garnet 1:30, PCP  3/19 Nanetta Durand, FNP 1:40 pm, endocrinology  6/2 Dr. Claretta Skene 11:40 am, endocrinology    Next Call:   1-2 weeks    Will Send:   Patient agreed to receive educational and resource materials by [employer]. The following education material was sent via mychart via email via [place]  mail:  - Nurse Navigator welcome letter  - Nurse Navigator contact information  -Dispatch Health Information  -Planning Health Meals [facility] Care Planning healthy meals)  -    Progress note and assessment routed to PCP.  Made aware that patient enrolled in care management.  Patient is aware he/she can contact nurse navigator, and contact information provided.    Lauraine BECKER BSN, RN,   Patient Care Nurse Navigator  [facility]  Rowe [phone]   F [phone]           [1]   Patient  Active Problem List  Diagnosis    Uncontrolled type 2 diabetes mellitus with hyperglycemia    Mixed hyperlipidemia    Obesity (BMI 30-39.9)    Neuropathy    Stroke    Essential hypertension    Diabetic peripheral neuropathy    History of hemorrhagic stroke with residual hemiparesis    Pancreas cyst    Breast pain, left    Perforated abdominal viscus    Anemia, unspecified type    Pure hypercholesterolemia, unspecified    Bilateral carotid artery disease, unspecified type    Fibromyalgia muscle pain    Morbid (severe) obesity due to excess calories    History of CVA (cerebrovascular accident)   [2]   Allergies  Allergen Reactions    Codeine Nausea And Vomiting    Crestor  [Rosuvastatin ] Rash     D/c due to severe rash and higher blood sugars    Glucosamine Hives and Nausea And Vomiting    Latex Rash    Tree Nuts Hives     ESPECIALLY ALMONDS     Almond Oil     Coconut (Cocos Nucifera)     Dairy [Milk-Related Compounds]     Egg-Derived Products      Patient had an allergy test and stated that she was allergic to eggs    Gabapentin      Dizziness and rash, irregular heart rate    Gluten Meal     Invokana  [Canagliflozin ]     Levemir  [Insulin  Detemir] Nausea And Vomiting    Lyrica [Pregabalin]      PT STATES IT MAKES HER NEUROPATHY WORSE STATES SHE FEELS LIKE SHE IS ON FIRE    Metformin   diarrhea    Repatha  [Evolocumab ] Headaches    Sucralfate Nausea And Vomiting     BODY ACHES, FEVER    Tresiba  [Insulin  Degludec]     Victoza  [Liraglutide ] Nausea And Vomiting    Flaxseed [Flaxseed (Linseed)] Rash    Omega 3 [Fish Oil] Rash    Peanut-Containing Drug Products Rash    Repaglinide  Anxiety   [3]   Current Outpatient Medications   Medication Sig Note    aspirin  81 MG chewable tablet Chew 1 tablet (81 mg) by mouth daily     glucose blood test strip Check sugar three times a day, ICD 10 E11.9     hydrocortisone 2.5 % cream as needed     insulin  glargine (LANTUS  SOLOSTAR) 100 UNIT/ML injection pen Inject 10 units into skin  daily at bedtime ONLY AS NEEDED IF BLOOD SUGAR >OR = TO 170 LAST TAKEN 07/2020 (Patient taking differently: Inject 14 Units into the skin nightly Inject 14 units into skin daily at bedtime) 12/11/2023: Takes 10 u BID    Insulin  Pen Needle (BD Pen Needle Nano U/F) 32G X 4 MM Misc Use as directed daily with insulin  pen     LORazepam  (ATIVAN ) 0.5 MG tablet Take 0.5 tablets (0.25 mg) by mouth nightly as needed for Anxiety     losartan  (COZAAR ) 50 MG tablet TAKE 1 TABLET(50 MG) BY MOUTH TWICE DAILY 12/11/2023: 100 mg at night    OneTouch Delica Lancets 30G Misc Test sugars x2/ day     verapamil  (VERELAN ) 180 MG 24 hr ER capsule Take 1 capsule (180 mg) by mouth daily     vitamin D , ergocalciferol , (DRISDOL ) 50000 UNIT Cap TAKE 1 CAPSULE BY MOUTH 1 TIME A WEEK     empagliflozin  (Jardiance ) 25 MG tablet Take 1 tablet (25 mg) by mouth daily (Patient not taking: Reported on 12/11/2023)     simvastatin  (ZOCOR ) 20 MG tablet Take 1 tablet (20 mg) by mouth nightly One tab po qd (Patient not taking: Reported on 12/11/2023)

## 2023-12-12 ENCOUNTER — Other Ambulatory Visit: Payer: Self-pay

## 2023-12-12 ENCOUNTER — Encounter (HOSPITAL_BASED_OUTPATIENT_CLINIC_OR_DEPARTMENT_OTHER): Payer: Self-pay

## 2023-12-12 ENCOUNTER — Telehealth (HOSPITAL_BASED_OUTPATIENT_CLINIC_OR_DEPARTMENT_OTHER): Payer: Self-pay | Admitting: Internal Medicine

## 2023-12-12 ENCOUNTER — Telehealth: Payer: Self-pay

## 2023-12-12 NOTE — Telephone Encounter (Signed)
 Writer called Ms. Kram at number on file informing her that Dr. Claretta Skene wants her to follow up with her nurse practitioner Judi Durand) tomorrow in the office at 1 pm to discuss adding Humalog  and to review her glucose readings. Ms. Brinker stated that she will be there. Her daughter has to work tomorrow and will not be able to come with her. Ms. Coomer was asked to bring her glucose readings with her tomorrow. Patient still has not provided her daily glucose logs.      Reena Ruth, LPN II  6:96 pm

## 2023-12-12 NOTE — Telephone Encounter (Signed)
 Writer called Emily Rowe in regards to her elevated glucose level and starting a short-acting insulin  (Humalog ). I spoke with Emily Rowe informing her again that the doctor wants her to start Humalog  (3 times daily) due to her elevated blood sugars. This was discussed with Emily Rowe previously when she walked into the office with a Jardiance  form for the doctor to complete (out of the office due to personal injury). Patient was also informed that the form that she dropped off is completed however there's portions of the form that she will need to complete as the patient requesting assistance. Emily Rowe verbalized her understanding that she is aware of the sections the she has to fill out. Emily Rowe stated that she's currently taking Lantus  and is hesitant to add another insulin . Patient stated that she wants to use Ozempic for weight loss. I explained to Emily Rowe that for now the doctor's focused on her elevated blood sugar levels and can address the weight loss at another time. I also reminded Emily Rowe to send me her blood sugar readings. She still has not provided them since stopping by the office. Emily Rowe voiced her understanding and stated that her daughter is coming to her home tonight (12/11/23) and she'll ask her to send the readings either then or tomorrow. I told Emily Rowe that I'd keep an eye out for her glucose reading and will call her tomorrow around 10 am. Patient voiced her understanding. Writer relayed this conversation to provider.      Emily Ruth, LPN II  12 pm

## 2023-12-12 NOTE — Progress Notes (Addendum)
 12/12/23 1236   Pt Outreach/Care Plan Metrics   Payor Grouping for Outreach MSSP   Care Plan  DM   Outreach Status for Metrics listing Left Voicemail 1     Nurse Navigator (NN) received message from [facility] pharmacist Echo T, indicating he had reached out to patient and left message. NN called pt to let her know Alpha was reaching out, no answer, lm on pt's voicemail with Thong's contact info and to let he know to gather all the meds she takes including OTC vitamins and supplements for review when she speaks to [place].      NN also sent mychart message for f/u from call yesterday. Includes intro letter and [employer] info and dispatch health information.      Nurse Navigator will continue to follow patient, providing support, reinforcing education and monitoring for new and ongoing needs. Provided NN contact information for any questions or concerns.     Lauraine BECKER BSN, RN,   Patient Care Nurse Navigator  [facility]  T [phone]   F [phone]      Addendum 2:25  Pt calls back states she received call from [place] but unable to hear him.  NN provided Thong's contact info, pt to call now.    Lauraine BECKER BSN, RN,   Patient Care Nurse Navigator  [facility]  T [phone]   F [phone]

## 2023-12-12 NOTE — Telephone Encounter (Addendum)
 Ambulatory Clinical Pharmacy Consult    Referring Provider/Care Manager:  Lauraine Rocker    PCP:  Anbarasan, Malarmathi T, MD    Primary Pharmacy Concern:  Emily Rowe is insulin  dependent who has difficulty paying for her Jardiance  (states its;$795/mo).  She has many drug allergies and sensitivities and has limited meds she can tolerate.  She tolerated Jardiance  well but not taking due to cost.  Takes Lantus  BID 10u, current HgbA1c is 10.6.  Her endocrinologist might have someone else looking into this but unclear from chart notes.  Additionally pt taking a chewable 81 mg ASA and reports it's bothering her and making her stomach hard and painful, hasn't tried enteric coated though.  Would like thoughts on this. Care manager also notes that she won't take simvistatin due to side effects and has some natural herb she takes.        Pharmacy Benefits Manager (as of 12/12/2023):     [employer] MediBlue Rx Plus [id number]    Deductible phase $590  For Tier(s) 2-5 only  Initial coverage phase (AKA Copay/Coinsurance)   Tier 1 (Preferred Generic): $0   Tier 2 (Generic): $4   Tier 3 (Preferred Brand): 15%   Tier 4 (Non-preferred Generic or Brand): 41%  Tier 5 (Specialty):  25% for 30 day only  Covered Insulin : $35 (flat rate no deductible)    Catastrophic Coverage  Zero cost sharing to patient once patient reaches $2000 max out of pocket.    Cost shares if you receive Extra Help  Brand Drugs Up to $12.15 copay  Generic Drugs Up to $4.90 copay      Cost Estimated based on Tier and a 30 day supply at preferred retail pharmacy    Tier 3  Jardiance :  Full cost $605, Deductible of $590 does apply, 15% coinsurance $91 (after deductible if applicable), catastrophic coverage $0    Covered Insulin   Lantus :  Full cost $100, Deductible does NOT apply, $35 flat rate, catastrophic coverage $0    Emily Rowe (preferred retail pharmacy  network)    ======================================================================================================================    Ways to get help with Prescription cost (updated 2025)  https://www.davila.com/    Apply for Extra Help (2025 updated):  https://www.davila.com/  Medicare and Social Security have a program called Extra Help--a way for people with limited income and resources to get help with prescription costs. If you qualify for Extra Help, you could pay no more than:  $4.90 for each generic covered drug ($4.50 in 2024)  $12.15 for each brand-name covered drug ($11.20 in 2024)  https://www.davila.com/  Income & Resource Limits for 2024  Individual $22,590 & $17,220  Married couple  $30,660 & $34,360  [email or web address]  [phone]    Jardiance  Resources (as of 05/17/2023)  BI Cares Foundation Patient Assistance  https://www.boehringer-ingelheim.com/us /our-responsibility/patient-assistance-program  https://www.boehringer-ingelheim.com/us /bipdf/pap-eligibility-informationpdf  https://www.boehringer-ingelheim.com/sites/default/files/us /2023-01/bi_cares_pap_application.pdf  Income limit of $37,650 for household size 1  Additional $13,450 for each additional household member  https://www.needymeds.org/poverty-guidelines-percents    Copay Assist Card  https://www.jardiance .com/type-2-diabetes/savings/  Pay as low as $10/mo with a maximum benefit of $175 off per month  Patient must of commercial insurance      Patient Outreach: Outreach successful.  Patient reports that her deductible has gone up from $150 to over $500 and can not afford the jardiance .  States she has a history of poor tolerance to other medications and does not want another medication.  Says that she was in contact with a prescription program where she paid a small fee for them to look for resources.  Writer informed  patient that resources may be similar than what we have available but can still send her the resources over MyChart.  Patient reports that her household income and resources could meet medicare extra help, but states that she does not want to work with medicare for these resources and currently working with a group to help set up a patient assistance program.  Write explained that some patient assistance programs will require proof of Extra Help Denial if income is under the limit.  Also explained that Extra Help program will help with more then the cost of 1 medication, it helps with premiums, deductibles and copays.  Encouraged patient to apply and will send information through mychart.      Patient expressed understanding and agrees to plan.    Assessment  Medication Access  Cost barriers to jardiance  due to plan structure (deductible & coinsurance)  Due to previous allergy history and medication sensitivity, Not amenable to med changes unless necessary.   Based on patient interview, could be eligible for Medicare Extra Help or BI Cares Patient Assistance    Plan  Medication Access  Pharmacy team to send cost resources via mychart  Patient to review medicare extra help and PAP process and outreach to team if needed      ===================================================    Alpha Camp, PharmD  Pharmacy Clinical Specialist  [facility], Ambulatory Care Management Team  [address]  [place], [place] [address]  MALVA [phone]

## 2023-12-13 ENCOUNTER — Other Ambulatory Visit: Payer: Self-pay

## 2023-12-13 ENCOUNTER — Encounter (HOSPITAL_BASED_OUTPATIENT_CLINIC_OR_DEPARTMENT_OTHER): Payer: Self-pay | Admitting: Family Nurse Practitioner

## 2023-12-13 ENCOUNTER — Ambulatory Visit (INDEPENDENT_AMBULATORY_CARE_PROVIDER_SITE_OTHER): Payer: Medicare Other | Admitting: Family Nurse Practitioner

## 2023-12-13 VITALS — BP 155/84 | HR 80 | Temp 97.6°F | Wt 205.2 lb

## 2023-12-13 DIAGNOSIS — E669 Obesity, unspecified: Secondary | ICD-10-CM

## 2023-12-13 DIAGNOSIS — E782 Mixed hyperlipidemia: Secondary | ICD-10-CM

## 2023-12-13 DIAGNOSIS — I10 Essential (primary) hypertension: Secondary | ICD-10-CM

## 2023-12-13 DIAGNOSIS — E1165 Type 2 diabetes mellitus with hyperglycemia: Secondary | ICD-10-CM

## 2023-12-13 NOTE — Progress Notes (Signed)
 Ambulatory Care Management- Social Work  Pt referred to [facility] [employer] SW by [employer], Sarah L., due to pt's need for financial and food resources. Pt is a diabetic.     [facility] [employer]:  [phone]  [address]  [place], [place] [address]  Staff confirmed that SW can complete request form online on pt's behalf. Staff confirmed that pick up address remains the same as above.     [employer] Outreach:  SW placed outreach call to pt and left v/m requesting return call.     Ludie Blander, MSW, LMSW, CCM  Supervisee in Social Work  Product Manager II, Ambulatory Care Management     [facility]   [address], [place], [place] [address]  T [phone] F [phone]   [email or web address]

## 2023-12-13 NOTE — Patient Instructions (Addendum)
 Hello     You saw Emily Durand, NP on 12/13/2023 for follow up on blood sugar management   The following issues were addressed:  [facility] Goals Current A1C is elevated above goal and 9.4% 05/30/2023 .   Plan Going Forward     STOP Prandin  1 mg   Start Novolog  5 with breakfast, Lunch, and Dinner: If you do not eat a meal, do not take  Cont. Lantus  15 units at midday every 24 hours   Check blood sugars 4 times a day: Before breakfast, lunch, dinner and bedtime: Record for dosage adjustments  If you are on a CGM; Continue close monitoring   Test and verify with a fingerstick all low blood sugars < 60 mg/dl or highs >649 mg seen on CGM   Contact Endocrinology for blood sugars < 70 mg/dl and persistent high's of > 300 mg/dl     Dietary and Lifestyle modification:  Exercises & Food Intake  Limit intake of refined carbohydrates; such bread, rice, and pasta, refined sugars and sugar sweetened beverages     Labs are due:  Repeat A1c 02/2024 Please do labs 5 days before appointment   Follow up:  3 months

## 2023-12-13 NOTE — Progress Notes (Signed)
 Date Time: @IPTODAYDATE @ 2:33 PM  Patient Name: Emily Rowe  Attending Physician: No att. providers found    Date: 12/13/2023 2:33 PM   Patient ID: Emily Rowe is a 73 y.o. female.  Chief Complaint:     Chief Complaint   Patient presents with    Diabetes     Follow Up   A1c: 10.6% (2.18.25)      History of Present Illness:   Emily Rowe is a 73 y.o. female with a Body mass index is 35.23 kg/m. and a history of type 2 diabetes  who presents forVisit type:  FOLLOW UP    Ref:      Pertinent History/Interval:    Type: Type II and non-insulin  requiring  Current symptoms: none  Complications: without complications  Control:  9.1%   Comorbid illness: hyperlipidemia    Taking potassium pill daily along with eating sweet potato and avocado everyday.     Last follow-up:  11/19/2022  with the patient's endocrinologist Emily Melanee Ransom, MD   AT LAST VISIT:   PLAN==>> Recommend increasing lantus  to 14 units at bedtime. Target fasting blood sugars 100-150 mg/dl.  Discontinue glipizide .  Recommend initiation of Prandin  1 mg 30 min prior to meals x2/ day prior to lunch and dinner. Elected to start on prandin  instead of bolus insulin .       TODAY   Patient present w/ for follow up   At present she is just taking Lantus  10 units BID   She is not taking Jardaince. Jardiance  was too expensive   She is not taking Glipizide .  (Reports the company d/c the med)???  She did not pick up Pradin from the pharmacy       Diabetes Medication Regimen:  Daily basal insulin  dosing: Lantus  10 units at night or every other day   Daily bolus insulin  dosing: non-insulin  requiring  Glipizide  10 mg 2 tabs with breakfast  Jardiance  10 daily   Lab Results   Component Value Date    HGBA1C 10.6 (H) 12/05/2023       Previous Medications:   Metformin  Unable to tolerate due to diarrhea   Victoza  Unable to tolerate n/v,   Levemir  (caused nausea and vomiting),   Tresiba  (did not tolerate, not sure reason for intolerance)     Protection Meds:   Statin:    Simvastatin  20 mg    ACE-I/ARB:       Self-Monitoring Blood Glucose:  Checks Blood Sugars: Yes [x]   No []   Home Glucose Readings:   Fasting:   Before Lunch:  Before Dinner:  Bedtime:  After meals:   Hypoglycemia: Yes []   No []     24 hour Meals Recall:  Diet:  eating sweet potato and avocado everyday.   Breakfast : moderate compliance with recommended diet;   Lunch :    Dinner :    Snack :    Beverages:   Exercise:  walks occasionally     Diabetes Complications:  Ophthalmology Diabetic Screening: last retinal exam was >1 year ago  Ophthalmology: no retinopathy, no glaucoma, no cataracts,  Podiatric Diabetic Screening: last retinal exam was >1 year ago  Podiatry/Neuropathy Podiatric     PROBLEM LIST   Problem List[1]  CURRENT MEDICATIONS   Medications Taking[2]   ALLERGIES   Allergies[3]  PAST MEDICAL HISTORY   Medical History[4]  PAST SURGICAL HISTORY   Past Surgical History[5]  FAMILY HISTORY   Family History[6]  SOCIAL HISTORY   Social History[7]  ROS     ROS: A complete 12 point ROS was obtained. Pertinent positives and negatives as noted in HPI. All other systems are negative.     General/Constitutional:  Denies Chills. Denies Fatigue. Denies Fever.   Ophthalmologic: Denies Blurred vision. Denies Eye Pain.   ENT: Denies Nasal Discharge. Denies Ear pain. Denies Sinus pain.   Endocrine: Denies Polydipsia. Denies Polyuria.   Respiratory: Denies Cough. Denies Orthopnea. Denies Shortness of breath. Denies Wheezing.   Cardiovascular: Denies Chest pain. Denies Chest pain with exertion. Denies Leg Claudication. Denies Palpitations. Denies Swelling in hands/feet.   Gastrointestinal: Denies Abdominal pain. Denies Blood in stool. Denies Constipation. Denies Diarrhea.Denies Heartburn. Denies Nausea. Denies Vomiting.   Genitourinary: Denies Blood in urine. Denies Frequent urination. Denies Painful urination.   Musculoskeletal: Denies Leg cramps. Denies Muscle aches.   Skin: Denies Skin lesion(s).   Neurologic: Denies  Dizziness. Denies Gait abnormality. Denies Headache. Denies Tingling/Numbness.     Objective/Physical Exam      GENERAL: alert, in no acute distress, well developed, well nourished,oriented to time, place, and person.   NOSE: normal nasal mucosa.   ORAL CAVITY: normal oropharynx, normal lips, mucosa moist.   THROAT: normal appearance, clear.   NECK/THYROID : neck supple,  no cervical lymphadenopathy,no thyromegaly.  LUNGS: normal effort / no distress, normal breath sounds, clear to auscultation bilaterally, no wheezes.  HEART: S1, S2 normal, no murmurs, regular rate and rhythm.   ABD: Soft, non-distended. Non-tender to palpation. Normoactive bowel sounds.  MUSCK: No joint erythema or edema.  EXTREMITIES: no clubbing, cyanosis, or edema B/L.   LYMPH: No large cervical lymph nodes.  SKIN: Warm, well perfused. No rashes; adequate skin turgor. axillae  PSYCH: Alert, appropriately interactive for age.  PODIATRIC: Diabetes Foot Exam Performed (visual , sensory and pulse):    Monofilament testing of plantar aspect of first toe and metatarsal joints (10g monofilament):  Intact bilaterally, Proporieation  Pinprick perception testing of dorsum of feet: Intact bilaterally: Proporieation  Dorsalis pedis and posterior tibial pulses:  Intact bilaterally:  Normal hair growth: Intact bilaterally:   Normal pigmentation: Intact bilaterally:  RAD's      Radiology Results (24 Hour)       ** No results found for the last 24 hours. **          Labs:     Lab Results   Component Value Date    WBC 6.08 12/05/2023    HGB 12.9 12/05/2023    HCT 41.5 12/05/2023    RBC 4.65 12/05/2023    PLT 143 12/05/2023    MCV 89.2 12/05/2023    MCH 27.7 12/05/2023    MCHC 31.1 (L) 12/05/2023    RDW 13 12/05/2023    MPV 13.9 (H) 12/05/2023     Lab Results   Component Value Date    NA 139 12/05/2023    K 4.2 12/05/2023    CL 104 12/05/2023    CO2 25 12/05/2023    GLU 241 (H) 12/05/2023    BUN 13 12/05/2023    CREAT 0.7 12/05/2023    CA 9.0 12/05/2023     ANIONGAP 10.0 12/05/2023    EGFR >60.0 12/05/2023    PHOS 2.9 06/05/2018    AST 21 12/05/2023    ALT 21 12/05/2023    ALKPHOS 111 12/05/2023    ALB 3.7 12/05/2023    PROT 6.9 12/05/2023    GLOB 3.2 12/05/2023    AGRATIO 1.2 12/05/2023    BILITOTAL 0.5 12/05/2023  Lab Results   Component Value Date    CHOL 207 (H) 12/05/2023    TRIG 136 12/05/2023    HDL 51 12/05/2023    LDL 129 (H) 12/05/2023    LABVLDL 27 12/05/2023    CHOLHDLRATIO 4.1 12/05/2023     Lab Results   Component Value Date    TSH 2.10 11/17/2022    T4FREE 1.00 08/09/2022     No results found for: TESTOSTERFRE, TESTOSTERONE  No results found for: DHEA, PROLACTIN, FSH, LH, ESTRADIOL  Lab Results   Component Value Date    VITD 15 (L) 11/17/2022    B12 511 10/29/2019    IRON 86 10/29/2019    TIBC 293 10/29/2019     Lab Results   Component Value Date    VITD 15 (L) 11/17/2022     Lab Results   Component Value Date    PTT 36 07/14/2021     Lab Results   Component Value Date    ALT 21 12/05/2023    AST 21 12/05/2023    ALKPHOS 111 12/05/2023    BILITOTAL 0.5 12/05/2023     Lab Results   Component Value Date    URMICROALB 7.0 09/01/2023    URMICROALB 9 09/01/2023   No components found for: URMICROALBCREAT    Lab Results   Component Value Date    HGBA1C 10.6 (H) 12/05/2023     TRENDS     Lab Results   Component Value Date    HGBA1C 10.6 (H) 12/05/2023    HGBA1C 9.7 (H) 09/01/2023    HGBA1C 9.4 (H) 05/30/2023     Lab Results   Component Value Date    CHOL 207 (H) 12/05/2023    CHOL 198 09/01/2023    CHOL 177 05/30/2023     Lab Results   Component Value Date    HDL 51 12/05/2023    HDL 46 09/01/2023    HDL 50 05/30/2023     Lab Results   Component Value Date    LDL 129 (H) 12/05/2023    LDL 129 (H) 09/01/2023    LDL 105 05/30/2023     Lab Results   Component Value Date    TRIG 136 12/05/2023    TRIG 114 09/01/2023    TRIG 110 05/30/2023     Lab Results   Component Value Date    LDL 129 (H) 12/05/2023    LDL 129 (H) 09/01/2023    LDL 105  05/30/2023     In Office/POCT Labs     Results       ** No results found for the last 24 hours. **          Results       None          CGM/PUMP/DATA/MEDIA       Vitals   BP 155/84 (BP Site: Left arm, Patient Position: Sitting, Cuff Size: Medium)   Pulse 80   Temp 97.6 F (36.4 C) (Temporal)   Wt 93.1 kg (205 lb 4 oz)   BMI 35.23 kg/m          BP Readings from Last 3 Encounters:   12/15/23 150/90   12/13/23 155/84   12/05/23 (!) 158/98      Last 3 Weights for the past 72 hrs (Last 3 readings):   Weight   12/13/23 1313 93.1 kg (205 lb 4 oz)      Wt Readings from Last 4 Encounters:   12/13/23 1313  93.1 kg (205 lb 4 oz)   12/05/23 0844 93.1 kg (205 lb 4 oz)   11/20/23 1017 90.1 kg (198 lb 9.6 oz)   09/01/23 0751 91.6 kg (202 lb)      Assessment/Plan:   KIANNI LHEUREUX is a 73 y.o. female with a Body mass index is 35.23 kg/m. and a history of  type 2 diabetes  who presents forVisit type: FOLLOW UP   Ref:     Latest Reference Range & Units 02/27/23 08:55 05/30/23 09:07 09/01/23 08:18 12/05/23 09:14   Hemoglobin A1C 4.6 - 5.6 % 9.2 (H) 9.4 (H) 9.7 (H) 10.6 (H)     Her A1c has worsened   She was last prescribed Prandin  1 mg, but has not picked it up   She is taking Lantus  10 BID, as she is not on anything else     She did bring glucose log; Fastings are high. Post meals are high.   She does have multiple allergies     Plan:  Given limitations. Insulin  is best at this time, as her glucoses have worsened   Rec=> Start Novolog  and keep on basal bolus   Change back to once a day dosing of Lantus      # Type 2 Diabetes Mellitus   Start Novolog  5 with breakfast, Lunch, and Dinner  Cont. Lantus  15 units at midday every 24 hours   Advised to check sugars 4 times daily and return with blood sugar log/meter  Notify of extreme highs for increases to reach target range     # Obesity   Noted=> patient has hx of intolerance to glp-1 ra   She has been recommend calorie restricted diet and exercise 150 min/week combined cardio and  strength training   referred to CDE     # Hypertension   BP 155/84 (BP Site: Left arm, Patient Position: Sitting, Cuff Size: Medium)   Pulse 80   Temp 97.6 F (36.4 C) (Temporal)   Wt 93.1 kg (205 lb 4 oz)   BMI 35.23 kg/m     High Blood Pressure: Continue to optimize low salt diet and aerobic exercise efforts. Recommend optimizing therapeutic lifestyle changes which include obtaining at least 150 minutes of aerobic exercise per week and eating a heart healthy diet ( i.e. DASH diet - www.heart.org or popsteam.is ).  Continue Current medication: Verapamil  180 mg + Cozaar  100 mg at night   As per PCP     # Dyslipidemia    Latest Reference Range & Units 05/30/23 09:07   Cholesterol <=199 mg/dL 822   HDL >=59 mg/dL 50   LDL Calculated 0 - 129 mg/dL 894   Triglycerides 34 - 149 mg/dL 889   Cholesterol / HDL Ratio Index 3.5   VLDL Calculated 10 - 40 mg/dL 22     Current Lipid values within acceptable limits on appropriate intensity statin medication.    Noted history of elevated - LDL   Has hx of taking Repatha , reports he stopped Repatha  because it has increased sugars  On Simvastatin  20 mg QHS at present.  Given dietary adjustments.     Follow UP    Return in about 3 months (around 03/11/2024). Dr. Susi for continued management   Please follow up in 6 months with me   1. Uncontrolled type 2 diabetes mellitus with hyperglycemia  - POCT Glucose; Future  - POCT Glucose  - insulin  aspart (NovoLOG  FlexPen) 100 UNIT/ML injection pen; Inject 7 Units into the skin 3 (  three) times daily before meals  Dispense: 30 mL; Refill: 3    2. Mixed hyperlipidemia  - insulin  aspart (NovoLOG  FlexPen) 100 UNIT/ML injection pen; Inject 7 Units into the skin 3 (three) times daily before meals  Dispense: 30 mL; Refill: 3    3. Obesity (BMI 30-39.9)  - insulin  aspart (NovoLOG  FlexPen) 100 UNIT/ML injection pen; Inject 7 Units into the skin 3 (three) times daily before meals  Dispense: 30 mL; Refill: 3    4. Essential  hypertension  - insulin  aspart (NovoLOG  FlexPen) 100 UNIT/ML injection pen; Inject 7 Units into the skin 3 (three) times daily before meals  Dispense: 30 mL; Refill: 3       *A total of 30 minutes includes previsit review of patient's chart and medical records, visit discussion, patient education and counseling, post visit documentation of assessment and plan, placement of lab orders, imaging, referrals, prescriptions and coordination of care. >50% were in was spent discussing and counseling the patient on the various evaluative and therapeutic options, as discussed above.      *This note was generated by the Epic EMR system/ Dragon speech recognition and may contain inherent errors or omissions not intended by the user. Grammatical errors, random word insertions, deletions, pronoun errors and incomplete sentences are occasional consequences of this technology due to software limitations. Not all errors are caught or corrected. If there are questions or concerns about the content of this note or information contained within the body of this dictation they should be addressed directly with the author for clarification.*          [1]   Patient Active Problem List  Diagnosis    Uncontrolled type 2 diabetes mellitus with hyperglycemia    Mixed hyperlipidemia    Obesity (BMI 30-39.9)    Neuropathy    Stroke    Essential hypertension    Diabetic peripheral neuropathy    History of hemorrhagic stroke with residual hemiparesis    Pancreas cyst    Breast pain, left    Perforated abdominal viscus    Anemia, unspecified type    Pure hypercholesterolemia, unspecified    Bilateral carotid artery disease, unspecified type    Fibromyalgia muscle pain    Morbid (severe) obesity due to excess calories    History of CVA (cerebrovascular accident)   [2]   Outpatient Medications Marked as Taking for the 12/13/23 encounter (Office Visit) with Aida Muskrat, FNP   Medication Sig Dispense Refill    aspirin  81 MG chewable tablet Chew 1  tablet (81 mg) by mouth daily      glucose blood test strip Check sugar three times a day, ICD 10 E11.9 100 each 10    hydrocortisone 2.5 % cream as needed      insulin  glargine (LANTUS  SOLOSTAR) 100 UNIT/ML injection pen Inject 10 units into skin daily at bedtime ONLY AS NEEDED IF BLOOD SUGAR >OR = TO 170 LAST TAKEN 07/2020 (Patient taking differently: Inject 14 Units into the skin nightly 10 am and 10 pm) 15 mL 1    Insulin  Pen Needle (BD Pen Needle Nano U/F) 32G X 4 MM Misc Use as directed daily with insulin  pen 100 each 1    LORazepam  (ATIVAN ) 0.5 MG tablet Take 0.5 tablets (0.25 mg) by mouth nightly as needed for Anxiety 20 tablet 0    losartan  (COZAAR ) 50 MG tablet TAKE 1 TABLET(50 MG) BY MOUTH TWICE DAILY 60 tablet 3    OneTouch Delica Lancets 30G Misc Test sugars  x2/ day 100 each 3    verapamil  (VERELAN ) 180 MG 24 hr ER capsule Take 1 capsule (180 mg) by mouth daily 90 capsule 1    vitamin D , ergocalciferol , (DRISDOL ) 50000 UNIT Cap TAKE 1 CAPSULE BY MOUTH 1 TIME A WEEK 12 capsule 1   [3]   Allergies  Allergen Reactions    Codeine Nausea And Vomiting    Crestor  [Rosuvastatin ] Rash     D/c due to severe rash and higher blood sugars    Glucosamine Hives and Nausea And Vomiting    Latex Rash    Tree Nuts Hives     ESPECIALLY ALMONDS     Almond Oil     Coconut (Cocos Nucifera)     Dairy [Milk-Related Compounds]     Egg-Derived Products      Patient had an allergy test and stated that she was allergic to eggs    Gabapentin      Dizziness and rash, irregular heart rate    Gluten Meal     Invokana  [Canagliflozin ]     Levemir  [Insulin  Detemir] Nausea And Vomiting    Lyrica [Pregabalin]      PT STATES IT MAKES HER NEUROPATHY WORSE STATES SHE FEELS LIKE SHE IS ON FIRE    Metformin       diarrhea    Repatha  [Evolocumab ] Headaches    Sucralfate Nausea And Vomiting     BODY ACHES, FEVER    Tresiba  [Insulin  Degludec]     Victoza  [Liraglutide ] Nausea And Vomiting    Flaxseed [Flaxseed (Linseed)] Rash    Omega 3 [Fish Oil]  Rash    Peanut-Containing Drug Products Rash    Repaglinide  Anxiety   [4]   Past Medical History:  Diagnosis Date    Anxiety     Asthma     allergic reaction to mold, not since 20 years, HAS WHEEZING IN COLD WEATHER. NO INHALERS    Bowel perforation     COLONOSCOPY 09/2019    Depression     Diabetes     Difficulty walking     WEAK RIGHT LEG AFTER CVA 2021    Dysphagia     BIG PILLS ONLY     Gastroesophageal reflux disease     PREVACID OTC PRN     Hyperlipemia     Hypertensive disorder     Hypokalemia     CMP REQ PCP 09/14/20    Neuropathy     NUMBNESS RIGHT UE AND LE     Post-operative nausea and vomiting     ALWAYS WITH ANESTHESIA     Sleep apnea 2017    NO CPAP NEEDED PER PT     Stroke 2012     ARM, LEG WEAK. WALKS WITH CANE, WALKER    TIA (transient ischemic attack)     MULTIPLE BEFORE CVA 2012    Type 2 diabetes mellitus, controlled 2021    MANAGED BY DR Emily RAO. AM FSB 117-140. TAKES LANTUS  PRN IN AM. HAIC 7.9   [5]   Past Surgical History:  Procedure Laterality Date    BREAST BIOPSY Left     benign lump biopsy    COLONOSCOPY, DIAGNOSTIC (SCREENING)      X 2    COLONOSCOPY, DIAGNOSTIC (SCREENING) N/A 09/17/2020    Procedure: COLONOSCOPY;  Surgeon: Nemiah Lamar BIRCH, MD;  Location: [facility];  Service: Gastroenterology;  Laterality: N/A;    EGD N/A 10/28/2020    Procedure: EGD w/ bx's;  Surgeon: Marvis Waunita RAMAN,  MD;  Location:  [facility]COPY OR;  Service: Gastroenterology;  Laterality: N/A;    GALLBLADDER SURGERY  03/14/2023    HYSTERECTOMY      2005    OOPHORECTOMY Bilateral     2005    REDUCTION MAMMAPLASTY      TONSILLECTOMY      urinary bladder      BLADDER LIFT    [6]   Family History  Problem Relation Name Age of Onset    Heart attack Mother      Diabetes Father      Diabetes Maternal Grandmother      Stroke Maternal Grandmother      Stroke Maternal Grandfather      Breast cancer Daughter  30    Colon cancer Neg Hx     [7]   Social History  Tobacco Use    Smoking status: Never     Passive  exposure: Never    Smokeless tobacco: Never   Vaping Use    Vaping status: Never Used   Substance Use Topics    Alcohol use: Never    Drug use: Never

## 2023-12-15 ENCOUNTER — Ambulatory Visit (INDEPENDENT_AMBULATORY_CARE_PROVIDER_SITE_OTHER): Payer: Medicare Other | Admitting: Family Medicine

## 2023-12-15 ENCOUNTER — Encounter (INDEPENDENT_AMBULATORY_CARE_PROVIDER_SITE_OTHER): Payer: Self-pay | Admitting: Family Medicine

## 2023-12-15 VITALS — BP 150/90 | HR 81 | Temp 98.1°F | Resp 16

## 2023-12-15 DIAGNOSIS — E1142 Type 2 diabetes mellitus with diabetic polyneuropathy: Secondary | ICD-10-CM

## 2023-12-15 DIAGNOSIS — T7840XD Allergy, unspecified, subsequent encounter: Secondary | ICD-10-CM

## 2023-12-15 DIAGNOSIS — F419 Anxiety disorder, unspecified: Secondary | ICD-10-CM

## 2023-12-15 DIAGNOSIS — I69359 Hemiplegia and hemiparesis following cerebral infarction affecting unspecified side: Secondary | ICD-10-CM

## 2023-12-15 DIAGNOSIS — E1165 Type 2 diabetes mellitus with hyperglycemia: Secondary | ICD-10-CM

## 2023-12-15 DIAGNOSIS — F32A Depression, unspecified: Secondary | ICD-10-CM

## 2023-12-15 MED ORDER — NOVOLOG FLEXPEN 100 UNIT/ML SC SOPN
7.0000 [IU] | PEN_INJECTOR | Freq: Three times a day (TID) | SUBCUTANEOUS | 3 refills | Status: AC
Start: 2023-12-15 — End: ?

## 2023-12-15 MED ORDER — NOVOLOG FLEXPEN 100 UNIT/ML SC SOPN
7.0000 [IU] | PEN_INJECTOR | Freq: Three times a day (TID) | SUBCUTANEOUS | 3 refills | Status: DC
Start: 2023-12-15 — End: 2023-12-15

## 2023-12-15 NOTE — Progress Notes (Signed)
 Have you seen any specialists/other providers since your last visit with us ?    Yes-Endo    Arm preference verified?   Yes    Health Maintenance Due   Topic Date Due    Advance Directive on File  Never done    Shingrix Vaccine 50+ (1) Never done    Tetanus Ten-Year  09/04/2022    MAMMOGRAM  11/30/2023         Emily Rowe is a 73 y.o. female who presents for Diabetes Follow-up and Depression   History of Present Illness  Emily Rowe is a 73 year old female with diabetes and multiple medication allergies who presents with issues managing her diabetes medications. She is accompanied by her daughter, who is driving her today.    She is experiencing significant challenges in managing her diabetes due to multiple medication allergies and issues with her current insulin  regimen. Her insulin  was not working effectively, and she was unable to afford Jardiance  due to increased insurance costs. She previously used a specific brand of glipizide  that was effective, but it is no longer available. She is currently taking Lantus  15 units at midday and was supposed to start NovoLog  5 units with meals, but the prescription was not sent to the pharmacy.    She has allergies to several medications including Victoza , Levemir , Invokana , Tresiba , and Prandin , which caused a rash and gastrointestinal symptoms. Her history of allergies to various foods and medications has significantly impacted her diet and medication options. She is allergic to wheat, eggs, nuts, and seafood, and experiences gastrointestinal discomfort with most foods. She has not seen an allergist since 2020 and is not currently taking any medication for allergies.    Her blood sugar levels have been high, with recent readings of 241 mg/dL and 740 mg/dL. She checks her blood sugar four times a day and manages her diet carefully to avoid hypoglycemia, especially when using insulin . She does not currently carry glucose tablets but uses juice or ginger ale to manage  low blood sugar episodes when necessary.    She has a history of strokes, resulting in weakness on both sides, more pronounced on the right. She also experiences anxiety and depression, which have been challenging to manage due to medication sensitivities. She has tried multiple antidepressants, including Celexa and Cymbalta , but experienced adverse effects. She is considering retrying sertraline  at a low dose, as previous attempts resulted in increased anxiety.      Verbal consent obtained to record this visit.     HPI       The other 14 point review systems are negative except those in HPI.    Current Medications:  Medications Taking[1]    Past Medical History:  Medical History[2]    Past Surgical History:  Past Surgical History[3]    Family History:  Family History[4]    Social History:   reports that she has never smoked. She has never been exposed to tobacco smoke. She has never used smokeless tobacco. She reports that she does not drink alcohol and does not use drugs.    Allergies:  Allergies[5]     The following sections were reviewed this encounter by the provider:   Tobacco  Allergies  Meds  Problems  Med Hx  Surg Hx  Fam Hx         VITALS:  Vitals:    12/15/23 1325   BP: 150/90   BP Site: Left arm   Patient Position: Sitting  Cuff Size: Large   Pulse: 81   Resp: 16   Temp: 98.1 F (36.7 C)   TempSrc: Oral   SpO2: 95%        REVIEW OF SYSTEMS:  Review of Systems   Constitutional:  Positive for malaise/fatigue. Negative for chills and fever.   HENT:  Negative for congestion, ear pain and sore throat.    Eyes:  Negative for blurred vision, double vision and pain.   Respiratory:  Negative for cough, shortness of breath and wheezing.    Cardiovascular:  Negative for chest pain, palpitations and leg swelling.   Gastrointestinal:  Negative for abdominal pain, diarrhea, heartburn, nausea and vomiting.   Genitourinary:  Negative for dysuria, hematuria and urgency.   Musculoskeletal:  Negative for joint  pain and myalgias.   Skin:  Negative for rash.   Neurological:  Positive for weakness. Negative for dizziness and headaches.   Psychiatric/Behavioral:  Positive for depression. Negative for hallucinations, substance abuse and suicidal ideas. The patient is nervous/anxious. The patient does not have insomnia.         PHYSICAL EXAM:  Physical Exam      Physical Exam  Vitals and nursing note reviewed.   Constitutional:       General: She is not in acute distress.     Appearance: Normal appearance.   HENT:      Nose: Nose normal. No congestion.      Mouth/Throat:      Mouth: Mucous membranes are moist.      Pharynx: Oropharynx is clear.   Eyes:      General: No scleral icterus.     Conjunctiva/sclera: Conjunctivae normal.      Pupils: Pupils are equal, round, and reactive to light.   Cardiovascular:      Rate and Rhythm: Normal rate and regular rhythm.      Heart sounds: No murmur heard.  Pulmonary:      Effort: Pulmonary effort is normal. No respiratory distress.      Breath sounds: Normal breath sounds. No wheezing or rhonchi.   Abdominal:      General: Abdomen is flat. Bowel sounds are normal.   Musculoskeletal:      Right lower leg: No edema.      Left lower leg: No edema.      Comments: Right-sided weakness more pronounced than the left bilateral weakness because of her stroke   Neurological:      Mental Status: She is alert.      Motor: Weakness present.      Gait: Gait abnormal.   Psychiatric:         Mood and Affect: Mood normal.            Lab Results   Component Value Date    WBC 6.08 12/05/2023    HGB 12.9 12/05/2023    HCT 41.5 12/05/2023    PLT 143 12/05/2023    CHOL 207 (H) 12/05/2023    TRIG 136 12/05/2023    HDL 51 12/05/2023    LDL 129 (H) 12/05/2023    ALT 21 12/05/2023    AST 21 12/05/2023    NA 139 12/05/2023    K 4.2 12/05/2023    CL 104 12/05/2023    CREAT 0.7 12/05/2023    BUN 13 12/05/2023    CO2 25 12/05/2023    TSH 2.10 11/17/2022    INR 1.0 07/14/2021    GLU 241 (H) 12/05/2023    HGBA1C 10.6  (H)  12/05/2023       Results  LABS  Hb: 12.9 g/dL (97/81/7974)  PLT: 856 x10^3/L (12/05/2023)  Blood glucose: 241 mg/dL (97/81/7974)    Assessment/Plan:         1. Uncontrolled type 2 diabetes mellitus with hyperglycemia  - Follow Up In Primary Care; Future    2. History of hemorrhagic stroke with residual hemiparesis  - Follow Up In Primary Care; Future    3. Morbid (severe) obesity due to excess calories  - Follow Up In Primary Care; Future    4. Diabetic peripheral neuropathy  - Follow Up In Primary Care; Future    5. Allergy, subsequent encounter  - Ambulatory referral to Allergy; Future  - Follow Up In Primary Care; Future    6. Anxiety and depression  - Follow Up In Primary Care; Future         Assessment & Plan  Diabetes Mellitus  Poor control with multiple medication allergies and intolerances. Recent addition of NovoLog  5 units with meals by her endocrinologist and Lantus  15 units daily. High risk for hypoglycemia due to multiple medications.  -Continue NovoLog  and Lantus  as prescribed by endocrinologist.  -Check blood sugar four times a day.  -Obtain glucose tablets for potential hypoglycemic episodes.  -Consider continuous glucose monitor for better blood sugar control.  I discussed this in detail with patient    Multiple Drug Allergies  Numerous reported allergies to medications, including various diabetes medications and antidepressants. Also reports food allergies.  -Referral to allergist for comprehensive allergy testing and potential immunotherapy.    Depression/Anxiety  Reports significant symptoms but has had adverse reactions to multiple medications including Celexa, Cymbalta , and Zoloft .  -Hold on starting new medication until after allergist evaluation.  -Encouraged to continue therapy.  Once her allergy is taking care of maybe she can start a low-dose of Zoloft  and monitor sugars closely.  I am concerned starting it is because of his risk of hypoglycemia at this time.    General Health  Maintenance  -Return visit in one month with all current medications for review.      Follow Up In Primary Care          Routine, Future, Expected: 01/12/2024, Expires: 03/13/2025 Select the type of follow up: Follow Up Type of visit: In Person Who is this follow-up with? Me     Verbal consent obtained to record this visit.     Emily Brunty ONEIDA Commodore, MD        This note was generated by the Epic EMR system/ Dragon speech recognition and may contain inherent errors or omissions not intended by the user. Grammatical errors, random word insertions, deletions, pronoun errors and incomplete sentences are occasional consequences of this technology due to software limitations. Not all errors are caught or corrected. If there are questions or concerns about the content of this note or information contained within the body of this dictation they should be addressed directly with the author for clarification         [1]   Outpatient Medications Marked as Taking for the 12/15/23 encounter (Office Visit) with Marlin Brys T, MD   Medication Sig Dispense Refill    aspirin  81 MG chewable tablet Chew 1 tablet (81 mg) by mouth daily      glucose blood test strip Check sugar three times a day, ICD 10 E11.9 100 each 10    hydrocortisone 2.5 % cream as needed      insulin  glargine (LANTUS  SOLOSTAR) 100  UNIT/ML injection pen Inject 10 units into skin daily at bedtime ONLY AS NEEDED IF BLOOD SUGAR >OR = TO 170 LAST TAKEN 07/2020 (Patient taking differently: Inject 14 Units into the skin nightly 10 am and 10 pm) 15 mL 1    Insulin  Pen Needle (BD Pen Needle Nano U/F) 32G X 4 MM Misc Use as directed daily with insulin  pen 100 each 1    LORazepam  (ATIVAN ) 0.5 MG tablet Take 0.5 tablets (0.25 mg) by mouth nightly as needed for Anxiety 20 tablet 0    losartan  (COZAAR ) 50 MG tablet TAKE 1 TABLET(50 MG) BY MOUTH TWICE DAILY 60 tablet 3    OneTouch Delica Lancets 30G Misc Test sugars x2/ day 100 each 3    simvastatin  (ZOCOR ) 20 MG tablet  Take 1 tablet (20 mg) by mouth nightly One tab po qd      verapamil  (VERELAN ) 180 MG 24 hr ER capsule Take 1 capsule (180 mg) by mouth daily 90 capsule 1    vitamin D , ergocalciferol , (DRISDOL ) 50000 UNIT Cap TAKE 1 CAPSULE BY MOUTH 1 TIME A WEEK 12 capsule 1   [2]   Past Medical History:  Diagnosis Date    Anxiety     Asthma     allergic reaction to mold, not since 20 years, HAS WHEEZING IN COLD WEATHER. NO INHALERS    Bowel perforation     COLONOSCOPY 09/2019    Depression     Diabetes     Difficulty walking     WEAK RIGHT LEG AFTER CVA 2021    Dysphagia     BIG PILLS ONLY     Gastroesophageal reflux disease     PREVACID OTC PRN     Hyperlipemia     Hypertensive disorder     Hypokalemia     CMP REQ PCP 09/14/20    Neuropathy     NUMBNESS RIGHT UE AND LE     Post-operative nausea and vomiting     ALWAYS WITH ANESTHESIA     Sleep apnea 2017    NO CPAP NEEDED PER PT     Stroke 2012     ARM, LEG WEAK. WALKS WITH CANE, WALKER    TIA (transient ischemic attack)     MULTIPLE BEFORE CVA 2012    Type 2 diabetes mellitus, controlled 2021    MANAGED BY DR CLARETTA RAO. AM FSB 117-140. TAKES LANTUS  PRN IN AM. HAIC 7.9   [3]   Past Surgical History:  Procedure Laterality Date    BREAST BIOPSY Left     benign lump biopsy    COLONOSCOPY, DIAGNOSTIC (SCREENING)      X 2    COLONOSCOPY, DIAGNOSTIC (SCREENING) N/A 09/17/2020    Procedure: COLONOSCOPY;  Surgeon: Nemiah Lamar BIRCH, MD;  Location: [facility];  Service: Gastroenterology;  Laterality: N/A;    EGD N/A 10/28/2020    Procedure: EGD w/ bx's;  Surgeon: Marvis Waunita RAMAN, MD;  Location: [facility];  Service: Gastroenterology;  Laterality: N/A;    GALLBLADDER SURGERY  03/14/2023    HYSTERECTOMY      2005    OOPHORECTOMY Bilateral     2005    REDUCTION MAMMAPLASTY      TONSILLECTOMY      urinary bladder      BLADDER LIFT    [4]   Family History  Problem Relation Name Age of Onset    Heart attack Mother      Diabetes Father  Diabetes Maternal Grandmother       Stroke Maternal Grandmother      Stroke Maternal Grandfather      Breast cancer Daughter  30    Colon cancer Neg Hx     [5]   Allergies  Allergen Reactions    Codeine Nausea And Vomiting    Crestor  [Rosuvastatin ] Rash     D/c due to severe rash and higher blood sugars    Glucosamine Hives and Nausea And Vomiting    Latex Rash    Tree Nuts Hives     ESPECIALLY ALMONDS     Almond Oil     Coconut (Cocos Nucifera)     Dairy [Milk-Related Compounds]     Egg-Derived Products      Patient had an allergy test and stated that she was allergic to eggs    Gabapentin      Dizziness and rash, irregular heart rate    Gluten Meal     Invokana  [Canagliflozin ]     Levemir  [Insulin  Detemir] Nausea And Vomiting    Lyrica [Pregabalin]      PT STATES IT MAKES HER NEUROPATHY WORSE STATES SHE FEELS LIKE SHE IS ON FIRE    Metformin       diarrhea    Repatha  [Evolocumab ] Headaches    Sucralfate Nausea And Vomiting     BODY ACHES, FEVER    Tresiba  [Insulin  Degludec]     Victoza  [Liraglutide ] Nausea And Vomiting    Flaxseed [Flaxseed (Linseed)] Rash    Omega 3 [Fish Oil] Rash    Peanut-Containing Drug Products Rash    Repaglinide  Anxiety

## 2023-12-18 ENCOUNTER — Other Ambulatory Visit: Payer: Self-pay

## 2023-12-18 NOTE — Progress Notes (Signed)
 12/18/23 1541   Pt Outreach/Care Plan Metrics   Payor Grouping for Outreach MSSP   Care Plan  Multi Comorbid   Outreach Status for Metrics listing No Voicemail     Nurse Navigator placed a call to patient to follow up with care plan progress and discuss care management support. No answer and no voicemail.  NN will f/u again.    Nurse Navigator will continue to follow patient, providing support, reinforcing education and monitoring for new and ongoing needs. Provided NN contact information for any questions or concerns.     Lauraine BECKER BSN, RN,   Patient Care Nurse Navigator  [facility]  T [phone]   F [phone]

## 2023-12-19 ENCOUNTER — Telehealth (HOSPITAL_BASED_OUTPATIENT_CLINIC_OR_DEPARTMENT_OTHER): Payer: Self-pay

## 2023-12-19 NOTE — Telephone Encounter (Signed)
 PA initiated: [id number]    Drug: Aspart FlexPen   Status: Pending

## 2023-12-21 ENCOUNTER — Other Ambulatory Visit: Payer: Self-pay

## 2023-12-21 NOTE — Progress Notes (Signed)
 Ambulatory Care Management- Social Work  Patient Outreach:  SW placed follow up call to pt to follow up on ACM SW referral. V/m left requesting return call.       Ludie Blander, MSW, LMSW, CCM  Supervisee in Social Work  Product Manager II, Ambulatory Care Management     [facility]   [address] [place], [place] [address]  T [phone] F [phone]   [email or web address]

## 2023-12-23 ENCOUNTER — Encounter (INDEPENDENT_AMBULATORY_CARE_PROVIDER_SITE_OTHER): Payer: Self-pay

## 2023-12-25 ENCOUNTER — Other Ambulatory Visit (HOSPITAL_BASED_OUTPATIENT_CLINIC_OR_DEPARTMENT_OTHER): Payer: Self-pay | Admitting: Family Nurse Practitioner

## 2023-12-25 MED ORDER — HUMALOG KWIKPEN 100 UNIT/ML SC SOPN
5.0000 [IU] | PEN_INJECTOR | Freq: Three times a day (TID) | SUBCUTANEOUS | 11 refills | Status: AC
Start: 2023-12-25 — End: 2024-12-24

## 2023-12-27 ENCOUNTER — Ambulatory Visit (INDEPENDENT_AMBULATORY_CARE_PROVIDER_SITE_OTHER): Payer: Self-pay

## 2023-12-27 DIAGNOSIS — I10 Essential (primary) hypertension: Secondary | ICD-10-CM

## 2023-12-27 DIAGNOSIS — E119 Type 2 diabetes mellitus without complications: Secondary | ICD-10-CM

## 2024-01-01 ENCOUNTER — Encounter (HOSPITAL_BASED_OUTPATIENT_CLINIC_OR_DEPARTMENT_OTHER): Payer: Self-pay | Admitting: Internal Medicine

## 2024-01-02 ENCOUNTER — Other Ambulatory Visit: Payer: Self-pay

## 2024-01-02 NOTE — Progress Notes (Signed)
 01/02/24 1456   Pt Outreach/Care Plan Metrics   Payor Grouping for Outreach MSSP   Care Plan  Multi Comorbid   Outreach Status for Metrics listing Left Voicemail 2     Nurse Navigator placed a call to patient to follow up with care plan progress and discuss care management support. No answer, lmtcb on pt's cell phone    Nurse Navigator will continue to follow patient, providing support, reinforcing education and monitoring for new and ongoing needs. Provided NN contact information for any questions or concerns.     Lauraine BECKER BSN, RN,   Patient Care Nurse Navigator  [facility]  T [phone]   F [phone]

## 2024-01-03 ENCOUNTER — Ambulatory Visit (HOSPITAL_BASED_OUTPATIENT_CLINIC_OR_DEPARTMENT_OTHER): Payer: Medicare Other | Admitting: Family Nurse Practitioner

## 2024-01-10 ENCOUNTER — Other Ambulatory Visit (INDEPENDENT_AMBULATORY_CARE_PROVIDER_SITE_OTHER): Payer: Self-pay | Admitting: Family Medicine

## 2024-01-10 DIAGNOSIS — I10 Essential (primary) hypertension: Secondary | ICD-10-CM

## 2024-01-10 NOTE — Telephone Encounter (Signed)
 Medication(s) Requested: losartan    Medication(s) last refilled: Aug 31 2023, Qty 60, Refills 3  Last visit: Dec 15 2023  Patient has an upcoming appointment on January 12 2024.

## 2024-01-12 ENCOUNTER — Encounter (INDEPENDENT_AMBULATORY_CARE_PROVIDER_SITE_OTHER): Payer: Self-pay | Admitting: Family Medicine

## 2024-01-12 ENCOUNTER — Ambulatory Visit (INDEPENDENT_AMBULATORY_CARE_PROVIDER_SITE_OTHER): Admitting: Family Medicine

## 2024-01-12 DIAGNOSIS — F419 Anxiety disorder, unspecified: Secondary | ICD-10-CM

## 2024-01-12 DIAGNOSIS — I69359 Hemiplegia and hemiparesis following cerebral infarction affecting unspecified side: Secondary | ICD-10-CM

## 2024-01-12 DIAGNOSIS — E1165 Type 2 diabetes mellitus with hyperglycemia: Secondary | ICD-10-CM

## 2024-01-12 DIAGNOSIS — F32A Depression, unspecified: Secondary | ICD-10-CM

## 2024-01-12 DIAGNOSIS — Z794 Long term (current) use of insulin: Secondary | ICD-10-CM

## 2024-01-12 DIAGNOSIS — E1142 Type 2 diabetes mellitus with diabetic polyneuropathy: Secondary | ICD-10-CM

## 2024-01-12 NOTE — Progress Notes (Signed)
 Emily Rowe is a 73 y.o. female who presents for Diabetes Follow-up, Depression, and Anxiety   History of Present Illness  Emily Rowe, a patient with a history of diabetes, hypertension, and previous strokes, presents with uncontrolled blood sugars. She has been struggling with her medication regimen due to multiple allergies, making it difficult to manage her blood pressure and cholesterol. She has weakness on both sides due to previous strokes.    She recently connected with her endocrinologist's office and was prescribed insulin . She is currently on Lantus , taking fourteen units at bedtime, and Novolog , taking five units three times a day. However, she has been experiencing headaches when taking both medications together, so she has adjusted her regimen to taking the Novolog  in the morning and at dinner, and the Lantus  at lunchtime.    She has also started taking a natural supplement for sugar control, which she reports has given her more energy and improved her stomach function. Her blood sugar levels have decreased from 250 to 166.    She has also been struggling with cholesterol management due to her multiple medication allergies. She is not currently on any medication for cholesterol and is waiting to see a cardiologist.    Her blood pressure is managed with losartan , taking one 180mg  dose once a day and a 50mg  dose twice a day. Her home blood pressure readings are reportedly lower than those taken in the office.    She has a flexion deformity in her right hand and weakness in her right leg, which affects her mobility. She is considering starting physical therapy.    She also reports mood and anxiety issues, which seem to fluctuate with her blood sugar levels. Currently, her mood and anxiety are improving as her blood sugar levels decrease.      Verbal consent obtained to record this visit.     HPI       The other 14 point review systems are negative except those in HPI.    Current Medications:  Medications  Taking[1]    Past Medical History:  Medical History[2]    Past Surgical History:  Past Surgical History[3]    Family History:  Family History[4]    Social History:   reports that she has never smoked. She has never been exposed to tobacco smoke. She has never used smokeless tobacco. She reports that she does not drink alcohol and does not use drugs.    Allergies:  Allergies[5]     The following sections were reviewed this encounter by the provider:   Tobacco  Allergies  Problems  Med Hx  Surg Hx  Fam Hx         VITALS:  Vitals:    01/12/24 0853   BP: (!) 159/96   BP Site: Left arm   Patient Position: Sitting   Cuff Size: Large   Pulse: 78   Resp: 16   Temp: 98 F (36.7 C)   TempSrc: Oral   SpO2: 95%   Weight: 90.7 kg (200 lb)        Review of Systems   Constitutional:  Positive for malaise/fatigue. Negative for chills and fever.   HENT:  Negative for congestion, ear pain and sore throat.    Eyes:  Negative for blurred vision, double vision and pain.   Respiratory:  Negative for cough, shortness of breath and wheezing.    Cardiovascular:  Negative for chest pain, palpitations and leg swelling.   Gastrointestinal:  Negative for abdominal pain, diarrhea,  heartburn, nausea and vomiting.   Genitourinary:  Negative for dysuria, hematuria and urgency.   Musculoskeletal:  Negative for joint pain and myalgias.   Skin:  Negative for rash.   Neurological:  Positive for weakness. Negative for dizziness and headaches.   Psychiatric/Behavioral:  Positive for depression. Negative for hallucinations, substance abuse and suicidal ideas. The patient is nervous/anxious. The patient does not have insomnia.         PHYSICAL EXAM:  Physical Exam      Physical Exam  Vitals and nursing note reviewed.   Constitutional:       General: She is not in acute distress.     Appearance: Normal appearance.   HENT:      Nose: Nose normal. No congestion.      Mouth/Throat:      Mouth: Mucous membranes are moist.      Pharynx: Oropharynx is  clear.   Eyes:      General: No scleral icterus.     Conjunctiva/sclera: Conjunctivae normal.      Pupils: Pupils are equal, round, and reactive to light.   Cardiovascular:      Rate and Rhythm: Normal rate and regular rhythm.      Heart sounds: No murmur heard.  Pulmonary:      Effort: Pulmonary effort is normal. No respiratory distress.      Breath sounds: Normal breath sounds. No wheezing or rhonchi.   Abdominal:      General: Abdomen is flat. Bowel sounds are normal.   Musculoskeletal:      Right lower leg: No edema.      Left lower leg: No edema.      Comments: Right-sided weakness more pronounced than the left bilateral weakness because of her stroke   Neurological:      Mental Status: She is alert.      Motor: Weakness present.      Gait: Gait abnormal.   Psychiatric:         Mood and Affect: Mood normal.   Physical Exam  VITALS: BP- 159/96  MUSCULOSKELETAL: Right hand flexion deformity with limited range of motion to 45-50 degrees. Right shoulder abduction limited to 45 degrees. Right leg weakness with difficulty in lifting and ambulation, requiring support on the right side. Left hand exhibits better range of motion compared to the right.           Lab Results   Component Value Date    WBC 6.08 12/05/2023    HGB 12.9 12/05/2023    HCT 41.5 12/05/2023    PLT 143 12/05/2023    CHOL 207 (H) 12/05/2023    TRIG 136 12/05/2023    HDL 51 12/05/2023    LDL 129 (H) 12/05/2023    ALT 21 12/05/2023    AST 21 12/05/2023    NA 139 12/05/2023    K 4.2 12/05/2023    CL 104 12/05/2023    CREAT 0.7 12/05/2023    BUN 13 12/05/2023    CO2 25 12/05/2023    TSH 2.10 11/17/2022    INR 1.0 07/14/2021    GLU 241 (H) 12/05/2023    HGBA1C 10.6 (H) 12/05/2023       Results  LABS  Fasting blood glucose: 170 (01/12/2024)    Assessment/Plan:         1. Anxiety and depression  - Follow Up In Primary Care  - Follow Up In Primary Care; Future    2. Uncontrolled type 2 diabetes mellitus with hyperglycemia (  CMS/HCC)  - Follow Up In Primary  Care  - Follow Up In Primary Care; Future    3. History of hemorrhagic stroke with residual hemiparesis (CMS/HCC)  - Follow Up In Primary Care  - Follow Up In Primary Care; Future    4. Diabetic peripheral neuropathy (CMS/HCC)  - Follow Up In Primary Care  - Follow Up In Primary Care; Future         Assessment & Plan  Uncontrolled type 2 diabetes mellitus with hyperglycemia  Blood glucose levels have improved from 257 mg/dL to 833 mg/dL. She is on Lantus  14 units at bedtime and Insulin  Aspart 5 units twice daily (morning and dinner). She also takes a natural supplement for glycemic control, reporting improved energy and reduced nausea. She is aware of the risks of over-the-counter supplements and monitors her condition closely. She experiences headaches when insulin  doses are taken together, so she spaces them out throughout the day.  - Continue Lantus  14 units at bedtime.  - Continue Insulin  Aspart 5 units twice daily (morning and dinner).  - Monitor blood glucose levels regularly.  - Discuss any changes in symptoms or side effects with the healthcare provider.    Diabetic peripheral neuropathy  She experiences right-sided weakness due to previous strokes, affecting mobility. She is considering physical therapy and acupuncture but is awaiting insurance coverage for these services. Insurance will provide transportation for physical therapy.  - Initiate physical therapy once insurance coverage is confirmed.  - Consider acupuncture for additional support.    Hyperlipidemia  She is intolerant to statins and other cholesterol-lowering medications due to allergies and adverse reactions. Repatha  injections caused severe side effects. The cardiologist is exploring alternative treatments. Not taking cholesterol medications poses a high risk due to diabetes, hypertension, and history of strokes.  - Follow up with the cardiologist for alternative cholesterol-lowering treatments.    Hypertension  Blood pressure is better  controlled with Losartan  50 mg twice daily and Verapamil  180 mg daily. Today's reading was 159/96 mmHg, an improvement from previous visits. Home readings are reportedly lower, with no headaches or dizziness.  - Continue Losartan  50 mg twice daily.  - Continue Verapamil  180 mg daily.  - Monitor blood pressure regularly.    Anxiety and depression  Mood and anxiety fluctuate, often correlating with blood glucose levels. She reports feeling better as blood glucose levels have improved.  - Monitor mood and anxiety symptoms.  - Discuss any changes in symptoms with the healthcare provider.    General Health Maintenance  She is up to date on pneumonia vaccines and has had measles. She has not received the shingles vaccine due to previous reactions and is considering the RSV vaccine. She needs a tetanus booster.  - Receive tetanus booster at the pharmacy.  - Consider receiving the shingles vaccine if deemed safe.  - Consider receiving the RSV vaccine at the pharmacy.    Follow-up  She is advised to follow up in three months unless issues arise sooner.  - Schedule follow-up appointment in three months.  - Contact healthcare provider if any new symptoms or concerns arise.      Follow Up In Primary Care          Routine, Future, Expected: 04/13/2024, Expires: 04/13/2025 Select the type of follow up: Follow Up Type of visit: In Person Who is this follow-up with? Me For: Diabetes f/u     Verbal consent obtained to record this visit.     Wayman Hoard ONEIDA Commodore, MD           [  1]   Outpatient Medications Marked as Taking for the 01/12/24 encounter (Office Visit) with Alexandre Lightsey T, MD   Medication Sig Dispense Refill    aspirin  81 MG chewable tablet Chew 1 tablet (81 mg) by mouth daily      clobetasol (TEMOVATE) 0.05 % external solution APPLY TOPICALLY TO THE AFFECTED AREA DAILY AT BEDTIME AS NEEDED      empagliflozin  (Jardiance ) 25 MG tablet Take 1 tablet (25 mg) by mouth daily 60 tablet 5    glucose blood test strip Check  sugar three times a day, ICD 10 E11.9 100 each 10    HumaLOG  KwikPen 100 UNIT/ML pen-injector Inject 5 Units into the skin 3 (three) times daily before meals 4.5 mL 11    hydrocortisone 2.5 % cream as needed      insulin  aspart (NovoLOG  FlexPen) 100 UNIT/ML injection pen Inject 7 Units into the skin 3 (three) times daily before meals 30 mL 3    insulin  glargine (LANTUS  SOLOSTAR) 100 UNIT/ML injection pen Inject 10 units into skin daily at bedtime ONLY AS NEEDED IF BLOOD SUGAR >OR = TO 170 LAST TAKEN 07/2020 (Patient taking differently: Inject 14 Units into the skin nightly 10 am and 10 pm) 15 mL 1    Insulin  Pen Needle (BD Pen Needle Nano U/F) 32G X 4 MM Misc Use as directed daily with insulin  pen 100 each 1    LORazepam  (ATIVAN ) 0.5 MG tablet Take 0.5 tablets (0.25 mg) by mouth nightly as needed for Anxiety 20 tablet 0    losartan  (COZAAR ) 50 MG tablet TAKE 1 TABLET(50 MG) BY MOUTH TWICE DAILY 60 tablet 3    Multiple Vitamin (MULTI VITAMIN PO) Take by mouth DOISKA PLUS      OneTouch Delica Lancets 30G Misc Test sugars x2/ day 100 each 3    simvastatin  (ZOCOR ) 20 MG tablet Take 1 tablet (20 mg) by mouth nightly One tab po qd      triamcinolone (KENALOG) 0.1 % ointment APPLY TO THE AFFECTED AREA ON CHEST AND BACK TWICE DAILY. USE MAX 2 WEEKS PER MONTH. AVOID FACE UNDERARMS AND GROIN      UNABLE TO FIND SUGAR CONTROL-ORDERS ONLINE      verapamil  (VERELAN ) 180 MG 24 hr ER capsule Take 1 capsule (180 mg) by mouth daily 90 capsule 1    vitamin D , ergocalciferol , (DRISDOL ) 50000 UNIT Cap TAKE 1 CAPSULE BY MOUTH 1 TIME A WEEK 12 capsule 1   [2]   Past Medical History:  Diagnosis Date    Anxiety     Asthma     allergic reaction to mold, not since 20 years, HAS WHEEZING IN COLD WEATHER. NO INHALERS    Bowel perforation (CMS/HCC)     COLONOSCOPY 09/2019    Depression     Diabetes (CMS/HCC)     Difficulty walking     WEAK RIGHT LEG AFTER CVA 2021    Dysphagia     BIG PILLS ONLY     Gastroesophageal reflux disease     PREVACID  OTC PRN     Hyperlipemia     Hypertensive disorder     Hypokalemia     CMP REQ PCP 09/14/20    Neuropathy     NUMBNESS RIGHT UE AND LE     Post-operative nausea and vomiting     ALWAYS WITH ANESTHESIA     Sleep apnea 2017    NO CPAP NEEDED PER PT     Stroke (CMS/HCC) 2012  ARM, LEG WEAK. WALKS WITH CANE, WALKER    TIA (transient ischemic attack)     MULTIPLE BEFORE CVA 2012    Type 2 diabetes mellitus, controlled (CMS/HCC) 2021    MANAGED BY DR CLARETTA RAO. AM FSB 117-140. TAKES LANTUS  PRN IN AM. HAIC 7.9   [3]   Past Surgical History:  Procedure Laterality Date    BREAST BIOPSY Left     benign lump biopsy    COLONOSCOPY, DIAGNOSTIC (SCREENING)      X 2    COLONOSCOPY, DIAGNOSTIC (SCREENING) N/A 09/17/2020    Procedure: COLONOSCOPY;  Surgeon: Nemiah Lamar BIRCH, MD;  Location: [facility];  Service: Gastroenterology;  Laterality: N/A;    EGD N/A 10/28/2020    Procedure: EGD w/ bx's;  Surgeon: Marvis Waunita RAMAN, MD;  Location: [facility];  Service: Gastroenterology;  Laterality: N/A;    GALLBLADDER SURGERY  03/14/2023    HYSTERECTOMY      2005    OOPHORECTOMY Bilateral     2005    REDUCTION MAMMAPLASTY      TONSILLECTOMY      urinary bladder      BLADDER LIFT    [4]   Family History  Problem Relation Name Age of Onset    Heart attack Mother      Diabetes Father      Diabetes Maternal Grandmother      Stroke Maternal Grandmother      Stroke Maternal Grandfather      Breast cancer Daughter  30    Colon cancer Neg Hx     [5]   Allergies  Allergen Reactions    Codeine Nausea And Vomiting    Crestor  [Rosuvastatin ] Rash     D/c due to severe rash and higher blood sugars    Glucosamine Hives and Nausea And Vomiting    Latex Rash    Tree Nuts Hives     ESPECIALLY ALMONDS     Almond Oil     Coconut (Cocos Nucifera)     Dairy [Milk-Related Compounds]     Egg-Derived Products      Patient had an allergy test and stated that she was allergic to eggs    Gabapentin      Dizziness and rash, irregular heart rate     Gluten Meal     Invokana  [Canagliflozin ]     Levemir  [Insulin  Detemir] Nausea And Vomiting    Lyrica [Pregabalin]      PT STATES IT MAKES HER NEUROPATHY WORSE STATES SHE FEELS LIKE SHE IS ON FIRE    Metformin       diarrhea    Repatha  [Evolocumab ] Headaches    Sucralfate Nausea And Vomiting     BODY ACHES, FEVER    Tresiba  [Insulin  Degludec]     Victoza  [Liraglutide ] Nausea And Vomiting    Flaxseed [Flaxseed (Linseed)] Rash    Omega 3 [Fish Oil] Rash    Peanut-Containing Drug Products Rash    Repaglinide  Anxiety

## 2024-01-12 NOTE — Addendum Note (Signed)
 Addended by: Trellis Fries on: 01/12/2024 09:35 AM     Modules accepted: Orders

## 2024-01-12 NOTE — Progress Notes (Signed)
 Have you seen any specialists/other providers since your last visit with us ?    Yes-Allergist and Derm     Arm preference verified?   Yes    Health Maintenance Due   Topic Date Due    Statin Use  Never done    Advance Directive on File  Never done    Shingrix Vaccine 50+ (1) Never done    Tetanus Ten-Year  09/04/2022    Mammogram  11/30/2023

## 2024-01-22 ENCOUNTER — Other Ambulatory Visit: Payer: Self-pay

## 2024-01-22 NOTE — Progress Notes (Signed)
 01/22/24 1533   Pt Outreach/Care Plan Metrics   [employer] for Outreach MSSP   Care Plan  Multi Comorbid   Outreach Status for Metrics listing Left Voicemail 3     Nurse Navigator placed a call to patient to follow up with care plan progress and discuss care management support. No answer, lmtcb, will continue to follow.    Nurse Navigator will continue to follow patient, providing support, reinforcing education and monitoring for new and ongoing needs. Provided NN contact information for any questions or concerns.     Clance Crigler BSN, RN,   Patient Care Nurse Navigator  [facility]  T [phone]   F [phone]

## 2024-01-23 ENCOUNTER — Other Ambulatory Visit: Payer: Self-pay | Admitting: Family Medicine

## 2024-01-23 ENCOUNTER — Encounter (INDEPENDENT_AMBULATORY_CARE_PROVIDER_SITE_OTHER): Payer: Self-pay

## 2024-01-25 ENCOUNTER — Ambulatory Visit (INDEPENDENT_AMBULATORY_CARE_PROVIDER_SITE_OTHER): Payer: Self-pay | Admitting: Family Medicine

## 2024-01-25 ENCOUNTER — Ambulatory Visit (INDEPENDENT_AMBULATORY_CARE_PROVIDER_SITE_OTHER): Payer: Self-pay

## 2024-01-25 DIAGNOSIS — I10 Essential (primary) hypertension: Secondary | ICD-10-CM

## 2024-01-25 DIAGNOSIS — E119 Type 2 diabetes mellitus without complications: Secondary | ICD-10-CM

## 2024-02-02 NOTE — Progress Notes (Signed)
 error

## 2024-02-02 NOTE — Progress Notes (Signed)
 02/02/24 1501   Pt Outreach/Care Plan Metrics   Pay[employer] Grouping for Outreach MSSP   Care Plan  Multi Comorbid   Care Plan Progress Discharged   Outreach Status for Metrics listing   Product/process development scientist message)     Nurse Navigator sent [employer], previous 3 calls placed without response. Nurse navigator will no longer outreach.  Nurse navigator information provided for future reference and if care management required.  Patient un-enrolled from care management at this time, and provider made aware.    Clance Crigler BSN, RN,   Patient Care Nurse Navigator  [facility]  T [phone]   F [phone]

## 2024-02-03 ENCOUNTER — Other Ambulatory Visit (HOSPITAL_BASED_OUTPATIENT_CLINIC_OR_DEPARTMENT_OTHER): Payer: Self-pay | Admitting: Internal Medicine

## 2024-02-03 DIAGNOSIS — E1165 Type 2 diabetes mellitus with hyperglycemia: Secondary | ICD-10-CM

## 2024-02-03 DIAGNOSIS — Z794 Long term (current) use of insulin: Secondary | ICD-10-CM

## 2024-02-12 ENCOUNTER — Other Ambulatory Visit: Payer: Self-pay

## 2024-02-12 NOTE — Progress Notes (Signed)
 Ambulatory Care Management- Social Work  Patient Outreach:  SW placed outreach call to pt to follow up on ACM SW referral. SW left v/m w/ SW's contact information for call back.     Ander Katos, MSW, LMSW, CCM  Supervisee in Social Work  Product manager II, Ambulatory Care Management     [facility]   [address] [place], [place] [address]  T [phone] F [phone]   [email or web address]

## 2024-02-22 ENCOUNTER — Encounter (INDEPENDENT_AMBULATORY_CARE_PROVIDER_SITE_OTHER): Payer: Self-pay

## 2024-03-07 ENCOUNTER — Ambulatory Visit (INDEPENDENT_AMBULATORY_CARE_PROVIDER_SITE_OTHER)

## 2024-03-07 DIAGNOSIS — I10 Essential (primary) hypertension: Secondary | ICD-10-CM

## 2024-03-07 DIAGNOSIS — E119 Type 2 diabetes mellitus without complications: Secondary | ICD-10-CM

## 2024-03-14 ENCOUNTER — Other Ambulatory Visit: Payer: Self-pay

## 2024-03-14 NOTE — Progress Notes (Signed)
 Ambulatory Care Management- Social Work  Documentation:  Maximum # of prior attempts made to reach pt. No further outreach scheduled. Pt not enrolled in ACM SW care plan.     Ludie Blander, MSW, LMSW, CCM  Supervisee in Social Work  Product manager II, Ambulatory Care Management     [facility]   [address] [place], [place] [address]  T [phone] F [phone]   [email or web address]

## 2024-03-18 ENCOUNTER — Encounter (HOSPITAL_BASED_OUTPATIENT_CLINIC_OR_DEPARTMENT_OTHER): Payer: Self-pay | Admitting: Internal Medicine

## 2024-03-18 ENCOUNTER — Ambulatory Visit (HOSPITAL_BASED_OUTPATIENT_CLINIC_OR_DEPARTMENT_OTHER): Payer: Medicare Other | Admitting: Internal Medicine

## 2024-03-18 ENCOUNTER — Other Ambulatory Visit (HOSPITAL_BASED_OUTPATIENT_CLINIC_OR_DEPARTMENT_OTHER): Payer: Self-pay | Admitting: Internal Medicine

## 2024-03-18 ENCOUNTER — Other Ambulatory Visit (FREE_STANDING_LABORATORY_FACILITY)

## 2024-03-18 VITALS — BP 164/104 | HR 63 | Temp 97.7°F | Resp 18 | Ht 66.0 in | Wt 199.4 lb

## 2024-03-18 DIAGNOSIS — I10 Essential (primary) hypertension: Secondary | ICD-10-CM

## 2024-03-18 DIAGNOSIS — E1142 Type 2 diabetes mellitus with diabetic polyneuropathy: Secondary | ICD-10-CM

## 2024-03-18 DIAGNOSIS — E1165 Type 2 diabetes mellitus with hyperglycemia: Secondary | ICD-10-CM

## 2024-03-18 DIAGNOSIS — Z794 Long term (current) use of insulin: Secondary | ICD-10-CM

## 2024-03-18 LAB — COMPREHENSIVE METABOLIC PANEL
ALT: 22 U/L (ref <=55)
Albumin/Globulin Ratio: 1.1 (ref 0.9–2.2)
Albumin: 4 g/dL (ref 3.5–5.0)
Alkaline Phosphatase: 108 U/L (ref 37–117)
Anion Gap: 15 (ref 5.0–15.0)
BUN: 16 mg/dL (ref 7–21)
Bilirubin, Total: 0.4 mg/dL (ref 0.2–1.2)
CO2: 24 meq/L (ref 17–29)
Calcium: 9.3 mg/dL (ref 7.9–10.2)
Chloride: 102 meq/L (ref 99–111)
Creatinine: 0.6 mg/dL (ref 0.4–1.0)
GFR: >60 mL/min/1.73 m2 (ref >=60.0)
Globulin: 3.8 g/dL — ABNORMAL HIGH (ref 2.0–3.6)
Glucose: 133 mg/dL — ABNORMAL HIGH (ref 70–100)
Hemolysis Index: 154 {index}
Protein, Total: 7.8 g/dL (ref 6.0–8.3)
Sodium: 141 meq/L (ref 135–145)

## 2024-03-18 LAB — POCT GLUCOSE: Whole Blood Glucose POCT: 129 mg/dL — AB (ref 70–100)

## 2024-03-18 LAB — POCT GLYCOSYLATED HEMOGLOBIN (HGB A1C): POCT Hgb A1C: 10.9 % — AB (ref 3.9–5.9)

## 2024-03-18 MED ORDER — LANTUS SOLOSTAR 100 UNIT/ML SC SOPN
PEN_INJECTOR | SUBCUTANEOUS | 1 refills | Status: DC
Start: 2024-03-18 — End: 2024-05-01

## 2024-03-18 MED ORDER — LYUMJEV KWIKPEN 100 UNIT/ML SC SOPN
5.0000 [IU] | PEN_INJECTOR | Freq: Three times a day (TID) | SUBCUTANEOUS | 1 refills | Status: DC
Start: 2024-03-18 — End: 2024-05-01

## 2024-03-18 MED ORDER — LANTUS SOLOSTAR 100 UNIT/ML SC SOPN
PEN_INJECTOR | SUBCUTANEOUS | 1 refills | Status: DC
Start: 2024-03-18 — End: 2024-03-18

## 2024-03-18 MED ORDER — VERAPAMIL HCL ER 180 MG PO CP24
180.0000 mg | ORAL_CAPSULE | Freq: Every day | ORAL | 1 refills | Status: DC
Start: 2024-03-18 — End: 2024-08-21

## 2024-03-18 NOTE — Progress Notes (Signed)
 [facility] [place]         Chief Complaint   Patient presents with    Follow-up     Uncontrolled type 2 diabetes mellitus with hyperglycemia  BS:  129          A1c:   10.9% (Non-fasting)       Subjective     History of Present Illness  Emily Rowe is a 73 year old female with diabetes who presents with concerns about insulin  allergy and blood sugar management.    She developed a rash and hives after starting short-acting insulin  about a month ago. The symptoms began on the second day of insulin  use, accompanied by a headache and neck pain. The rash was widespread and severe, leading to consultations with an allergist and a dermatologist. A biopsy was performed, which was negative. The rash resolved after discontinuing the insulin .    She is currently on long-acting insulin , taking 14 units in the morning, and is also using Jardiance , which has helped lower her blood sugar levels. She has a previous intolerance to Victoza , which caused significant problems, and is concerned about trying similar medications like Ozempic due to her history of allergies.    Her recent blood sugar readings have been between 154 and 184 mg/dL, with a fasting level of 154 mg/dL. She checks her blood sugar three times a day and notes improvement since a period of higher readings following her sister's death. Despite dietary efforts, including eating salads and reducing red meat intake, her A1c remains elevated at 10%.    She has a history of allergies to multiple medications and foods, complicating her treatment options. She also mentions a cyst on her pancreas and a history of cholesterol medication intolerance, which caused rashes.    No current symptoms of rash or hives after discontinuing the short-acting insulin .    Review of Systems    ROS: A complete 12 point ROS was obtained. Pertinent positives and negatives as noted in HPI. All other systems are negative.   Objective   BP (!) 164/104 (BP Site:  Left arm, Patient Position: Sitting, Cuff Size: Large)   Pulse 63   Temp 97.7 F (36.5 C) (Oral)   Resp 18   Ht 1.676 m (5' 6)   Wt 90.4 kg (199 lb 6.4 oz)   SpO2 93%   BMI 32.18 kg/m   Physical Exam  Physical Exam  GENERAL: Alert, cooperative, well developed, no acute distress  HEENT: Normocephalic, normal oropharynx, moist mucous membranes  CHEST: Clear to auscultation bilaterally, No wheezes, rhonchi, or crackles  EXTREMITIES: No cyanosis or edema  NEUROLOGICAL: Cranial nerves grossly intact, Moves all extremities without gross motor or sensory deficit, Sensation symmetric and intact     Results  LABS  HbA1c: 10%  Blood Glucose: 164 mg/dL (94/73/7974)  Blood Glucose: 184 mg/dL (94/72/7974)  Blood Glucose: 154 mg/dL (fasting)  Blood Glucose: 174 mg/dL (postprandial)    PATHOLOGY  Biopsy: Negative for malignancy      Assessment/Plan   1. 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 (CMS/HCC) (Primary)    - Lantus  SoloStar 100 UNIT/ML injection pen; Inject 10 units into skin daily at bedtime ONLY AS NEEDED IF BLOOD SUGAR >OR = TO 170 LAST TAKEN 07/2020  Dispense: 15 mL; Refill: 1  - POCT Glucose; Future  - POCT Glycosylated Hemoglobin (A1C); Future  - POCT Glucose  - POCT Glycosylated Hemoglobin (A1C)  - Insulin  Lispro-aabc, 1 U Dial, (  Lyumjev  KwikPen) 100 UNIT/ML Solution Pen-injector; Inject 5 Units into the skin 3 (three) times daily Prior to meals  Dispense: 15 mL; Refill: 1  - Comprehensive Metabolic Panel; Future  - C-Peptide; Future  - Comprehensive Metabolic Panel  - C-Peptide  - Follow Up In Endocrinology; Future  - Follow Up In Endocrinology; Future    2. Long-term insulin  use (CMS/HCC)    - Lantus  SoloStar 100 UNIT/ML injection pen; Inject 10 units into skin daily at bedtime ONLY AS NEEDED IF BLOOD SUGAR >OR = TO 170 LAST TAKEN 07/2020  Dispense: 15 mL; Refill: 1  - POCT Glucose; Future  - POCT Glycosylated Hemoglobin (A1C); Future  - POCT Glucose  - POCT Glycosylated  Hemoglobin (A1C)    3. Benign hypertension    - verapamil  (VERELAN ) 180 MG 24 hr ER capsule; Take 1 capsule (180 mg) by mouth once daily  Dispense: 90 capsule; Refill: 1          Assessment & Plan  Type 2 diabetes mellitus with hyperglycemia  Poorly controlled with A1c of 10. Blood glucose fluctuates between 154-184 mg/dL. Intolerance to Victoza  and potential risk with Ozempic. Current treatment includes long-acting insulin  and Jardiance .  - Initiate different short-acting insulin .  - Continue Jardiance .  - Instruct to check blood glucose three times daily before meals.  - Schedule follow-up in three months with provider.  - Schedule follow-up in six weeks with [facility].    Allergy to insulin   Allergic reaction to short-acting insulin  resolved upon discontinuation. Biopsy negative for cancer.  - Prescribe different brand of short-acting insulin  Lyumjev  to test tolerance.    Multiple medication allergies  Intolerance to Victoza  and potential risk with Ozempic. Allergies extend to foods and other substances.    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  - Order blood work to assess pancreatic function.    She will return for a follow up with me in 3 months, with NP in 6 weeks      Joneen Claretta Skene MD MPH  Endocrinologist, [facility]      Verbal consent obtained to record this visit.

## 2024-03-18 NOTE — Patient Instructions (Signed)
 VISIT SUMMARY:    During your visit, we discussed your concerns about insulin  allergy and blood sugar management. You experienced a rash and hives after starting short-acting insulin , which resolved after discontinuing the medication. Your blood sugar levels have been fluctuating, and your A1c remains elevated despite dietary efforts. We also reviewed your history of multiple medication allergies and a cyst on your pancreas.    YOUR PLAN:    -TYPE 2 DIABETES MELLITUS WITH HYPERGLYCEMIA: Type 2 diabetes is a condition where your body does not use insulin  properly, leading to high blood sugar levels. We will start you on a different short-acting insulin  and continue your current medication, Jardiance . Please check your blood sugar three times daily before meals. We will follow up in three months, and you will also have a follow-up with Keisha in six weeks.    -ALLERGY TO INSULIN : You had an allergic reaction to short-acting insulin , which resolved after stopping the medication. We will prescribe a different brand of short-acting insulin  to see if you can tolerate it.    -MULTIPLE MEDICATION ALLERGIES: You have a history of allergies to various medications and foods, which complicates your treatment options. We will continue to monitor and adjust your medications as needed.    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 We will order blood work to check your pancreatic function.    INSTRUCTIONS:    Please follow up in three months with your provider and in six weeks with Nanetta. Continue to check your blood sugar three times daily before meals. We will also order blood work to assess your pancreatic function.

## 2024-03-20 LAB — C-PEPTIDE: C-Peptide: 1.14 ng/mL (ref 0.80–3.85)

## 2024-03-24 ENCOUNTER — Encounter (INDEPENDENT_AMBULATORY_CARE_PROVIDER_SITE_OTHER): Payer: Self-pay

## 2024-03-31 ENCOUNTER — Ambulatory Visit (HOSPITAL_BASED_OUTPATIENT_CLINIC_OR_DEPARTMENT_OTHER): Payer: Self-pay | Admitting: Internal Medicine

## 2024-04-12 ENCOUNTER — Encounter (INDEPENDENT_AMBULATORY_CARE_PROVIDER_SITE_OTHER): Payer: Self-pay | Admitting: Family Medicine

## 2024-04-12 ENCOUNTER — Ambulatory Visit (INDEPENDENT_AMBULATORY_CARE_PROVIDER_SITE_OTHER): Admitting: Family Medicine

## 2024-04-12 VITALS — BP 168/95 | HR 67 | Temp 97.4°F | Resp 16 | Wt 199.0 lb

## 2024-04-12 DIAGNOSIS — H1131 Conjunctival hemorrhage, right eye: Secondary | ICD-10-CM

## 2024-04-12 DIAGNOSIS — I10 Essential (primary) hypertension: Secondary | ICD-10-CM

## 2024-04-12 DIAGNOSIS — E559 Vitamin D deficiency, unspecified: Secondary | ICD-10-CM

## 2024-04-12 DIAGNOSIS — F32A Depression, unspecified: Secondary | ICD-10-CM

## 2024-04-12 DIAGNOSIS — F419 Anxiety disorder, unspecified: Secondary | ICD-10-CM

## 2024-04-12 DIAGNOSIS — E1165 Type 2 diabetes mellitus with hyperglycemia: Secondary | ICD-10-CM

## 2024-04-12 DIAGNOSIS — F4320 Adjustment disorder, unspecified: Secondary | ICD-10-CM

## 2024-04-12 DIAGNOSIS — I69359 Hemiplegia and hemiparesis following cerebral infarction affecting unspecified side: Secondary | ICD-10-CM

## 2024-04-12 DIAGNOSIS — E782 Mixed hyperlipidemia: Secondary | ICD-10-CM

## 2024-04-12 DIAGNOSIS — E1142 Type 2 diabetes mellitus with diabetic polyneuropathy: Secondary | ICD-10-CM

## 2024-04-12 MED ORDER — PITAVASTATIN CALCIUM 1 MG PO TABS
ORAL_TABLET | ORAL | 0 refills | Status: DC
Start: 2024-04-12 — End: 2024-08-26

## 2024-04-12 MED ORDER — VITAMIN D (ERGOCALCIFEROL) 1.25 MG (50000 UT) PO CAPS
50000.0000 [IU] | ORAL_CAPSULE | ORAL | 1 refills | Status: AC
Start: 2024-04-12 — End: ?

## 2024-04-12 NOTE — Progress Notes (Signed)
 Emily Rowe is a 73 y.o. female who presents for Diabetes Follow-up (Patient is fasting), Hyperlipidemia, and Hypertension   History of Present Illness  Emily Rowe is a 72 year old female with poorly controlled diabetes who presents for a follow-up visit.    She manages her diabetes with Lantus , Lispro, and Jardiance . Her A1c is 10. She adjusted her Lispro dose due to a prescription error and monitors her blood sugar three times daily, noting recent improvements. She does not use NovoLog  or Kibipen.    She is emotionally affected by her sister's recent passing in [place], which has been challenging. This stress may impact her diabetes control.    She experiences high blood pressure, especially with emotional stress and heat, and leg swelling. She takes Verapamil  and Losartan . She exercises regularly but is not attending physical therapy due to cost.    She tries to eat healthily, preparing her own meals, and consumes two apples a day. She avoids beef and seafood and uses lactose-free milk.    She recently got new glasses but feels her prescription may need adjustment due to high blood sugar levels. Her right eye is occasionally bloodshot without pain or injury.    She completed a course of vitamin D  supplementation earlier this year and feels she may need another prescription.      Verbal consent obtained to record this visit.     HPI       The other 14 point review systems are negative except those in HPI.    Current Medications:  Medications Taking[1]    Past Medical History:  Medical History[2]    Past Surgical History:  Past Surgical History[3]    Family History:  Family History[4]    Social History:   reports that she has never smoked. She has never been exposed to tobacco smoke. She has never used smokeless tobacco. She reports that she does not drink alcohol and does not use drugs.    Allergies:  Allergies[5]     The following sections were reviewed this encounter by the provider:    Tobacco  Allergies  Meds  Problems  Med Hx  Surg Hx  Fam Hx         VITALS:  Vitals:    04/12/24 0807   BP: (!) 168/95   BP Site: Left arm   Patient Position: Sitting   Cuff Size: Large   Pulse: 67   Resp: 16   Temp: 97.4 F (36.3 C)   TempSrc: Oral   SpO2: 94%   Weight: 90.3 kg (199 lb)          Review of Systems   Constitutional:  Positive for malaise/fatigue. Negative for chills and fever.   HENT:  Negative for congestion, ear pain and sore throat.    Eyes:  Negative for blurred vision, double vision and pain.   Respiratory:  Negative for cough, shortness of breath and wheezing.    Cardiovascular:  Negative for chest pain, palpitations and leg swelling.   Gastrointestinal:  Negative for abdominal pain, diarrhea, heartburn, nausea and vomiting.   Genitourinary:  Negative for dysuria, hematuria and urgency.   Musculoskeletal:  Negative for joint pain and myalgias.   Skin:  Negative for rash.   Neurological:  Positive for weakness. Negative for dizziness and headaches.   Psychiatric/Behavioral:  Positive for depression. Negative for hallucinations, substance abuse and suicidal ideas. The patient is nervous/anxious. The patient does not have insomnia.  PHYSICAL EXAM:  Physical Exam      Physical Exam  Vitals and nursing note reviewed.   Constitutional:       General: She is not in acute distress.     Appearance: Normal appearance.   HENT:      Nose: Nose normal. No congestion.      Mouth/Throat:      Mouth: Mucous membranes are moist.      Pharynx: Oropharynx is clear.   Eyes:      General: No scleral icterus.     Conjunctiva/sclera: Conjunctivae normal.      Pupils: Pupils are equal, round, and reactive to light.   Cardiovascular:      Rate and Rhythm: Normal rate and regular rhythm.      Heart sounds: No murmur heard.  Pulmonary:      Effort: Pulmonary effort is normal. No respiratory distress.      Breath sounds: Normal breath sounds. No wheezing or rhonchi.   Abdominal:      General: Abdomen is  flat. Bowel sounds are normal.   Musculoskeletal:      Right lower leg: No edema.      Left lower leg: No edema.      Comments: Right-sided weakness more pronounced than the left bilateral weakness because of her stroke   Neurological:      Mental Status: She is alert.      Motor: Weakness present.      Gait: Gait abnormal.   Psychiatric:         Mood and Affect: Mood normal.   Physical Exam  VITALS: BP- 168/90, SaO2- 94%  GENERAL: Alert, no acute distress.  HEENT: Right eye bloodshot.  EXTREMITIES: Leg swelling.      Lab Results   Component Value Date    WBC 6.08 12/05/2023    HGB 12.9 12/05/2023    HCT 41.5 12/05/2023    PLT 143 12/05/2023    CHOL 207 (H) 12/05/2023    TRIG 136 12/05/2023    HDL 51 12/05/2023    LDL 129 (H) 12/05/2023    ALT 22 03/18/2024    AST  03/18/2024      Comment:      Unable to report AST result due to the high hemolysis index result. Recollect if clinically indicated.    NA 141 03/18/2024    K  03/18/2024      Comment:      Unable to report Potassium result due to the high hemolysis index. Recollect if clinically indicated.    CL 102 03/18/2024    CREAT 0.6 03/18/2024    BUN 16 03/18/2024    CO2 24 03/18/2024    TSH 2.10 11/17/2022    INR 1.0 07/14/2021    GLU 133 (H) 03/18/2024    HGBA1C 10.9 (A) 03/18/2024       Results  LABS  A1c: 10% (03/18/2024)    RADIOLOGY  Mammogram: normal (06/11/2023)    Assessment/Plan:         1. Grief reaction  - Referral to Adult Psychiatry & Behavioral Health ([facility]); Future    2. Subconjunctival hemorrhage of right eye    3. Anxiety and depression  - Follow Up In Primary Care    4. Uncontrolled type 2 diabetes mellitus with hyperglycemia (CMS/HCC)  - Follow Up In Primary Care  - Pitavastatin Calcium  1 MG Tablet; ONE TABB PO QHS  Dispense: 90 tablet; Refill: 0  - Comprehensive Metabolic Panel; Future  - Hemoglobin  A1C; Future  - Lipid Panel; Future  - Urine Microalbumin, Random; Future  - Comprehensive Metabolic Panel  - Hemoglobin A1C  - Lipid Panel  -  Urine Microalbumin, Random    5. History of hemorrhagic stroke with residual hemiparesis (CMS/HCC)  - Follow Up In Primary Care  - Pitavastatin Calcium  1 MG Tablet; ONE TABB PO QHS  Dispense: 90 tablet; Refill: 0    6. Diabetic peripheral neuropathy (CMS/HCC)  - Follow Up In Primary Care    7. Mixed hyperlipidemia  - Pitavastatin Calcium  1 MG Tablet; ONE TABB PO QHS  Dispense: 90 tablet; Refill: 0  - Comprehensive Metabolic Panel; Future  - Hemoglobin A1C; Future  - Lipid Panel; Future  - Urine Microalbumin, Random; Future  - Comprehensive Metabolic Panel  - Hemoglobin A1C  - Lipid Panel  - Urine Microalbumin, Random    8. Essential hypertension  - Comprehensive Metabolic Panel; Future  - Hemoglobin A1C; Future  - Lipid Panel; Future  - Urine Microalbumin, Random; Future  - Comprehensive Metabolic Panel  - Hemoglobin A1C  - Lipid Panel  - Urine Microalbumin, Random    9. Vitamin D  deficiency  - vitamin D , ergocalciferol , (DRISDOL ) 50000 UNIT Cap; Take 1 capsule (50,000 Units) by mouth once a week  Dispense: 12 capsule; Refill: 1         Assessment & Plan  Type 2 Diabetes Mellitus  Poorly controlled with A1c of 10. Improved glucose levels with current regimen. Prescription error with Lispro dosage led to hypoglycemia.  - Continue Lantus  10 units in the morning.  - Continue Jardiance  25 mg at night.  - Adjust Lispro to 5 units as prescribed.  - Monitor blood glucose levels three times daily.  - Report blood glucose levels to the endocrinologist via MyChart.  - Send a picture of the insulin  prescription via MyChart.  - Schedule follow-up with endocrinologist in three months.  - Schedule follow-up with diabetic educator and counselor in six weeks.    Hypertension  Elevated blood pressure at 168/90, exacerbated by stress and heat. Emotional distress from sister's death contributing.  - Continue verapamil  180 mg ER once daily.  - Continue losartan  50 mg, two tablets at night.  - Encourage staying indoors during heat.  -  Encourage discussing emotions with a therapist to manage stress.    Grief and Emotional Distress  Grief and emotional distress impacting blood pressure and well-being. Referral to therapist recommended.  - Provide referral to a therapist for integrated behavior therapy.  - Encourage contacting the therapist for support.  - Advise on the importance of discussing emotions to manage stress.    Hyperlipidemia  Statin intolerant, simvastatin  causes side effects. Pitavastatin considered for better tolerance, insurance coverage uncertain.  - Discontinue simvastatin .  - Prescribe pitavastatin 1 mg once daily.  - Check insurance coverage for pitavastatin.  - Monitor for side effects and efficacy of pitavastatin.    Vitamin D  Deficiency  Completed weekly supplementation, requires maintenance. Insurance does not cover frequent testing.  - Prescribe one more month of weekly vitamin D  supplementation.  - Advise transitioning to over-the-counter vitamin D  after completion.    General Health Maintenance  Routine maintenance addressed. Concerns about eyeglass prescription accuracy due to glucose fluctuations. Dietary adjustments recommended.  - Encourage follow-up with eye doctor to reassess eyeglass prescription.  - Advise on maintaining a healthy diet and hydration.  - Encourage portion control with fruit intake, especially apples and pears.        Follow  Up In Primary Care          Routine, Future, Expected: 07/13/2024, Expires: 07/13/2025 Select the type of follow up: Follow Up Type of visit: In Person Who is this follow-up with? Me For: Diabetes     Verbal consent obtained to record this visit.     Ardeth ONEIDA Commodore, MD           [1]   Outpatient Medications Marked as Taking for the 04/12/24 encounter (Office Visit) with Naavya Postma T, MD   Medication Sig Dispense Refill    aspirin  81 MG chewable tablet Chew 1 tablet (81 mg) by mouth daily      B Complex Vitamins (Vitamin-B Complex) Tablet Take by mouth once daily       BD Pen Needle Nano 2nd Gen 32G X 4 MM Misc USE AS DIRECTED DAILY WITH INSULIN  PEN 100 each 1    clobetasol (TEMOVATE) 0.05 % external solution APPLY TOPICALLY TO THE AFFECTED AREA DAILY AT BEDTIME AS NEEDED      Cyanocobalamin (VITAMIN B-12 PO) Take by mouth once daily      empagliflozin  (Jardiance ) 25 MG tablet Take 1 tablet (25 mg) by mouth daily 60 tablet 5    glucose blood test strip Check sugar three times a day, ICD 10 E11.9 100 each 10    hydrocortisone 2.5 % cream as needed      Insulin  Lispro-aabc, 1 U Dial, (Lyumjev  KwikPen) 100 UNIT/ML Solution Pen-injector Inject 5 Units into the skin 3 (three) times daily Prior to meals (Patient taking differently: Inject 8 Units into the skin once daily Prior to meals) 15 mL 1    Lantus  SoloStar 100 UNIT/ML injection pen Inject 14 units into skin daily at bedtime 30 mL 1    LORazepam  (ATIVAN ) 0.5 MG tablet Take 0.5 tablets (0.25 mg) by mouth nightly as needed for Anxiety 20 tablet 0    losartan  (COZAAR ) 50 MG tablet TAKE 1 TABLET(50 MG) BY MOUTH TWICE DAILY 60 tablet 3    Multiple Vitamin (MULTI VITAMIN PO) Take by mouth DOISKA PLUS      OneTouch Delica Lancets 30G Misc Test sugars x2/ day 100 each 3    triamcinolone (KENALOG) 0.1 % ointment APPLY TO THE AFFECTED AREA ON CHEST AND BACK TWICE DAILY. USE MAX 2 WEEKS PER MONTH. AVOID FACE UNDERARMS AND GROIN      UNABLE TO FIND SUGAR CONTROL-ORDERS ONLINE      UNABLE TO FIND DOISKA-MULTI VITAMIN      verapamil  (VERELAN ) 180 MG 24 hr ER capsule Take 1 capsule (180 mg) by mouth once daily 90 capsule 1    [DISCONTINUED] simvastatin  (ZOCOR ) 20 MG tablet Take 1 tablet (20 mg) by mouth nightly One tab po qd (Patient taking differently: Take 1 tablet (20 mg) by mouth twice a week One tab po qd)      [DISCONTINUED] vitamin D , ergocalciferol , (DRISDOL ) 50000 UNIT Cap TAKE 1 CAPSULE BY MOUTH 1 TIME A WEEK 12 capsule 1   [2]   Past Medical History:  Diagnosis Date    Anxiety     Asthma     allergic reaction to mold, not since 20  years, HAS WHEEZING IN COLD WEATHER. NO INHALERS    Bowel perforation (CMS/HCC)     COLONOSCOPY 09/2019    Depression     Diabetes (CMS/HCC)     Difficulty walking     WEAK RIGHT LEG AFTER CVA 2021    Dysphagia     BIG  PILLS ONLY     Gastroesophageal reflux disease     PREVACID OTC PRN     Hyperlipemia     Hypertensive disorder     Hypokalemia     CMP REQ PCP 09/14/20    Neuropathy     NUMBNESS RIGHT UE AND LE     Pain     Legs/Arms/Back    Post-operative nausea and vomiting     ALWAYS WITH ANESTHESIA     Sleep apnea 2017    NO CPAP NEEDED PER PT     Stroke (CMS/HCC) 2012     ARM, LEG WEAK. WALKS WITH CANE, WALKER    TIA (transient ischemic attack)     MULTIPLE BEFORE CVA 2012    Type 2 diabetes mellitus, controlled (CMS/HCC) 2021    MANAGED BY DR CLARETTA RAO. AM FSB 117-140. TAKES LANTUS  PRN IN AM. HAIC 7.9    Urinary incontinence    [3]   Past Surgical History:  Procedure Laterality Date    BREAST BIOPSY Left     benign lump biopsy    COLONOSCOPY, DIAGNOSTIC (SCREENING)      X 2    COLONOSCOPY, DIAGNOSTIC (SCREENING) N/A 09/17/2020    Procedure: COLONOSCOPY;  Surgeon: Nemiah Lamar BIRCH, MD;  Location: [facility];  Service: Gastroenterology;  Laterality: N/A;    EGD N/A 10/28/2020    Procedure: EGD w/ bx's;  Surgeon: Marvis Waunita RAMAN, MD;  Location: [facility];  Service: Gastroenterology;  Laterality: N/A;    GALLBLADDER SURGERY  03/14/2023    HYSTERECTOMY      2005    OOPHORECTOMY Bilateral     2005    REDUCTION MAMMAPLASTY      TONSILLECTOMY      urinary bladder      BLADDER LIFT    [4]   Family History  Problem Relation Name Age of Onset    Heart attack Mother      Diabetes Father      Diabetes Maternal Grandmother      Stroke Maternal Grandmother      Stroke Maternal Grandfather      Breast cancer Daughter  30    Colon cancer Neg Hx     [5]   Allergies  Allergen Reactions    Codeine Nausea And Vomiting    Crestor  [Rosuvastatin ] Rash     D/c due to severe rash and higher blood sugars     Glucosamine Hives and Nausea And Vomiting    Latex Rash    Tree Nuts Hives     ESPECIALLY ALMONDS     Almond Oil     Coconut (Cocos Nucifera)     Dairy [Milk-Related Compounds]     Egg-Derived Products      Patient had an allergy test and stated that she was allergic to eggs    Gabapentin      Dizziness and rash, irregular heart rate    Gluten Meal     Invokana  [Canagliflozin ]     Levemir  [Insulin  Detemir] Nausea And Vomiting    Lyrica [Pregabalin]      PT STATES IT MAKES HER NEUROPATHY WORSE STATES SHE FEELS LIKE SHE IS ON FIRE    Metformin       diarrhea    Novolog  [Insulin  Aspart (Human Analog)] Hives    Repatha  [Evolocumab ] Headaches    Sucralfate Nausea And Vomiting     BODY ACHES, FEVER    Tresiba  [Insulin  Degludec]     Victoza  [Liraglutide ] Nausea And  Vomiting    Flaxseed [Flaxseed (Linseed)] Rash    Omega 3 [Fish Oil] Rash    Peanut-Containing Drug Products Rash    Repaglinide  Anxiety

## 2024-04-13 LAB — COMPREHENSIVE METABOLIC PANEL
ALT: 15 IU/L (ref 0–32)
AST (SGOT): 23 IU/L (ref 0–40)
Albumin: 4.3 g/dL (ref 3.9–4.9)
Alkaline Phosphatase: 97 IU/L (ref 44–121)
BUN / Creatinine Ratio: 26 (ref 12–28)
BUN: 19 mg/dL (ref 8–27)
Bilirubin, Total: 0.3 mg/dL (ref 0.0–1.2)
CO2: 23 mmol/L (ref 20–29)
Calcium: 9.2 mg/dL (ref 8.7–10.3)
Chloride: 100 mmol/L (ref 96–106)
Creatinine: 0.73 mg/dL (ref 0.57–1.00)
Globulin, Total: 2.5 g/dL (ref 1.5–4.5)
Glucose: 132 mg/dL — ABNORMAL HIGH (ref 70–99)
Potassium: 4.3 mmol/L (ref 3.5–5.2)
Protein, Total: 6.8 g/dL (ref 6.0–8.5)
Sodium: 141 mmol/L (ref 134–144)
eGFR: 88 mL/min/1.73 (ref >59)

## 2024-04-13 LAB — URINE MICROALBUMIN, RANDOM
Creatinine, UR: 51.7 mg/dL
Microalb/Crt. Ratio: 9 mg/g{creat} (ref 0–29)
Microalbumin, UR: 4.6 ug/mL

## 2024-04-13 LAB — LIPID PANEL
Cholesterol / HDL Ratio: 4.4 ratio (ref 0.0–4.4)
Cholesterol: 232 mg/dL — ABNORMAL HIGH (ref 100–199)
HDL: 53 mg/dL (ref >39)
LDL Chol Calculated (NIH): 155 mg/dL — ABNORMAL HIGH (ref 0–99)
Triglycerides: 135 mg/dL (ref 0–149)
VLDL Calculated: 24 mg/dL (ref 5–40)

## 2024-04-13 LAB — HEMOGLOBIN A1C: Hemoglobin A1C: 11.3 % — ABNORMAL HIGH (ref 4.8–5.6)

## 2024-04-14 ENCOUNTER — Ambulatory Visit (INDEPENDENT_AMBULATORY_CARE_PROVIDER_SITE_OTHER): Payer: Self-pay | Admitting: Family Medicine

## 2024-04-15 ENCOUNTER — Encounter (INDEPENDENT_AMBULATORY_CARE_PROVIDER_SITE_OTHER): Payer: Self-pay

## 2024-04-23 ENCOUNTER — Telehealth (INDEPENDENT_AMBULATORY_CARE_PROVIDER_SITE_OTHER): Payer: Self-pay

## 2024-04-23 ENCOUNTER — Encounter (INDEPENDENT_AMBULATORY_CARE_PROVIDER_SITE_OTHER): Payer: Self-pay | Admitting: Family Medicine

## 2024-04-23 ENCOUNTER — Encounter (INDEPENDENT_AMBULATORY_CARE_PROVIDER_SITE_OTHER): Payer: Self-pay

## 2024-04-23 NOTE — Telephone Encounter (Signed)
 Received a fax form patient's pharmacy listing alternatives for pitavastatin. Could you send another medication?

## 2024-04-23 NOTE — Telephone Encounter (Signed)
 I will start the PA for Pitavastatin per Dr Garnet request.         Started PA for Vit D

## 2024-04-24 ENCOUNTER — Encounter (INDEPENDENT_AMBULATORY_CARE_PROVIDER_SITE_OTHER): Payer: Self-pay | Admitting: Family Medicine

## 2024-05-01 ENCOUNTER — Ambulatory Visit (INDEPENDENT_AMBULATORY_CARE_PROVIDER_SITE_OTHER): Admitting: Family Nurse Practitioner

## 2024-05-01 ENCOUNTER — Encounter (HOSPITAL_BASED_OUTPATIENT_CLINIC_OR_DEPARTMENT_OTHER): Payer: Self-pay | Admitting: Family Nurse Practitioner

## 2024-05-01 VITALS — BP 157/94 | HR 69 | Temp 97.8°F | Ht 66.0 in | Wt 200.4 lb

## 2024-05-01 DIAGNOSIS — E1165 Type 2 diabetes mellitus with hyperglycemia: Secondary | ICD-10-CM

## 2024-05-01 DIAGNOSIS — Z794 Long term (current) use of insulin: Secondary | ICD-10-CM

## 2024-05-01 LAB — POCT GLUCOSE: Whole Blood Glucose POCT: 134 mg/dL — AB (ref 70–100)

## 2024-05-01 MED ORDER — LANTUS SOLOSTAR 100 UNIT/ML SC SOPN
15.0000 [IU] | PEN_INJECTOR | Freq: Two times a day (BID) | SUBCUTANEOUS | 3 refills | Status: DC
Start: 2024-05-01 — End: 2024-05-01

## 2024-05-01 MED ORDER — ACCU-CHEK GUIDE TEST VI STRP
ORAL_STRIP | 3 refills | Status: AC
Start: 2024-05-01 — End: ?

## 2024-05-01 MED ORDER — BD PEN NEEDLE NANO 2ND GEN 32G X 4 MM MISC
11 refills | Status: AC
Start: 2024-05-01 — End: ?

## 2024-05-01 MED ORDER — ACCU-CHEK SAFE-T PRO LANCETS MISC
5 refills | Status: AC
Start: 2024-05-01 — End: 2025-05-01

## 2024-05-01 MED ORDER — LANTUS SOLOSTAR 100 UNIT/ML SC SOPN
16.0000 [IU] | PEN_INJECTOR | Freq: Every evening | SUBCUTANEOUS | 3 refills | Status: AC
Start: 2024-05-01 — End: ?

## 2024-05-01 MED ORDER — LYUMJEV KWIKPEN 100 UNIT/ML SC SOPN
7.0000 [IU] | PEN_INJECTOR | Freq: Every day | SUBCUTANEOUS | 3 refills | Status: AC
Start: 2024-05-01 — End: ?

## 2024-05-01 NOTE — Patient Instructions (Addendum)
 Hello     You saw Nanetta Durand, NP on 05/01/2024 for follow up on blood sugar management   The following issues were addressed:  AIC Goals Current A1c has increased to  11.3%   Plan Going Forward       Increase to Lymjev  7 with breakfast, Lunch, and Dinner: If you do not eat a meal, do not take  Increase to Lantus  16 units at bedtime   Cont. Jardaince 25 mg daily   Check blood sugars 4 times a day: Before breakfast, lunch, dinner and bedtime: Record for dosage adjustments  If you are on a CGM; Continue close monitoring   Test and verify with a fingerstick all low blood sugars < 60 mg/dl or highs >649 mg seen on CGM   Contact Endocrinology for blood sugars < 70 mg/dl and persistent high's of > 300 mg/dl     Dietary and Lifestyle modification:  Exercises & Food Intake  Limit intake of refined carbohydrates; such bread, rice, and pasta, refined sugars and sugar sweetened beverages     Labs are due:  Repeat A1c 02/2024 Please do labs 5 days before appointment   Follow up:  3 months

## 2024-05-01 NOTE — Progress Notes (Signed)
 Date Time: @IPTODAYDATE @ 10:43 AM  Patient Name: Emily Rowe  Attending Physician: No att. providers found    Date: 05/01/2024 10:43 AM   Patient ID: Emily Rowe is a 73 y.o. female.  Chief Complaint:     Chief Complaint   Patient presents with    Diabetes     Follow Up  A1c: 11.3% (6.27.25)       History of Present Illness:   Emily Rowe is a 72 y.o. female with a Body mass index is 32.34 kg/m. and a history of type 2 diabetes  who presents for management of diabetes mellitus   Visit type: FOLLOW UP   Ref: Claretta Melanee Ransom, MD    Pertinent History/Interval:    Type: Type II and non-insulin  requiring  Current symptoms: none  Complications: without complications  Control: 9.1%   Comorbid illness: hyperlipidemia    Taking potassium pill daily along with eating sweet potato and avocado everyday.     Last follow-up: 03/18/2024 with the patient's endocrinologist Claretta Melanee Ransom, MD   AT LAST VISIT:   PLAN==>> - Prescribe different brand of short-acting insulin  Lyumjev  to test tolerance.     TODAY   Patient present w/ for follow up   She is just taking Lantus  10 units BID + Jardiance  25 mg + Lyumjev  5 units w/meals   Blood sugar was 155 this morning   CGM worsening her anxiety ==> so she does not want one     The Lyumjev  drops her BG very fast.     Diabetes Medication Regimen:  Daily basal insulin  dosing: Lantus  10 units at night or every other day   Daily bolus insulin  dosing: non-insulin  requiring  Glipizide  10 mg 2 tabs with breakfast  Jardiance  10 daily   Lab Results   Component Value Date    HGBA1C 11.3 (H) 04/12/2024       Previous Medications:   Metformin  Unable to tolerate due to diarrhea   Victoza  Unable to tolerate n/v,   Levemir  (caused nausea and vomiting),   Tresiba  (did not tolerate, not sure reason for intolerance)     Protection Meds:   Statin:   Simvastatin  20 mg    ACE-I/ARB:       Self-Monitoring Blood Glucose:  Checks Blood Sugars: Yes [x]   No []   Home Glucose  Readings:   Fasting:   Before Lunch:  Before Dinner:  Bedtime:  After meals:   Hypoglycemia: Yes []   No []     24 hour Meals Recall:  Diet: eating sweet potato and avocado everyday.   Breakfast : moderate compliance with recommended diet;   Lunch :    Dinner :    Snack :    Beverages:   Exercise: walks occasionally     Diabetes Complications:  Ophthalmology Diabetic Screening: last retinal exam was >1 year ago  Ophthalmology: no retinopathy, no glaucoma, no cataracts,  Podiatric Diabetic Screening: last retinal exam was >1 year ago  Podiatry/Neuropathy Podiatric     PROBLEM LIST   Problem List[1]  CURRENT MEDICATIONS   Medications Taking[2]   ALLERGIES   Allergies[3]  PAST MEDICAL HISTORY   Medical History[4]  PAST SURGICAL HISTORY   Past Surgical History[5]  FAMILY HISTORY   Family History[6]  SOCIAL HISTORY   Social History[7]     ROS     ROS: A complete 12 point ROS was obtained. Pertinent positives and negatives as noted in HPI. All other systems are negative.  General/Constitutional:  Denies Chills. Denies Fatigue. Denies Fever.   Ophthalmologic: Denies Blurred vision. Denies Eye Pain.   ENT: Denies Nasal Discharge. Denies Ear pain. Denies Sinus pain.   Endocrine: Denies Polydipsia. Denies Polyuria.   Respiratory: Denies Cough. Denies Orthopnea. Denies Shortness of breath. Denies Wheezing.   Cardiovascular: Denies Chest pain. Denies Chest pain with exertion. Denies Leg Claudication. Denies Palpitations. Denies Swelling in hands/feet.   Gastrointestinal: Denies Abdominal pain. Denies Blood in stool. Denies Constipation. Denies Diarrhea.Denies Heartburn. Denies Nausea. Denies Vomiting.   Genitourinary: Denies Blood in urine. Denies Frequent urination. Denies Painful urination.   Musculoskeletal: Denies Leg cramps. Denies Muscle aches.   Skin: Denies Skin lesion(s).   Neurologic: Denies Dizziness. Denies Gait abnormality. Denies Headache. Denies Tingling/Numbness.     Objective/Physical Exam      GENERAL: alert,  in no acute distress, well developed, well nourished,oriented to time, place, and person.   NOSE: normal nasal mucosa.   ORAL CAVITY: normal oropharynx, normal lips, mucosa moist.   THROAT: normal appearance, clear.   NECK/THYROID : neck supple,  no cervical lymphadenopathy,no thyromegaly.  LUNGS: normal effort / no distress, normal breath sounds, clear to auscultation bilaterally, no wheezes.  HEART: S1, S2 normal, no murmurs, regular rate and rhythm.   ABD: Soft, non-distended. Non-tender to palpation. Normoactive bowel sounds.  MUSCK: No joint erythema or edema.  EXTREMITIES: no clubbing, cyanosis, or edema B/L.   LYMPH: No large cervical lymph nodes.  SKIN: Warm, well perfused. No rashes; adequate skin turgor. axillae  PSYCH: Alert, appropriately interactive for age.  PODIATRIC: Diabetes Foot Exam Performed (visual , sensory and pulse):    Monofilament testing of plantar aspect of first toe and metatarsal joints (10g monofilament):  Intact bilaterally, Proporieation  RAD's      No results found.    Labs:     Lab Results   Component Value Date    WBC 6.08 12/05/2023    HGB 12.9 12/05/2023    HCT 41.5 12/05/2023    RBC 4.65 12/05/2023    PLT 143 12/05/2023    MCV 89.2 12/05/2023    MCH 27.7 12/05/2023    MCHC 31.1 (L) 12/05/2023    RDW 13 12/05/2023    MPV 13.9 (H) 12/05/2023     Lab Results   Component Value Date    NA 141 04/12/2024    K 4.3 04/12/2024    CL 100 04/12/2024    CO2 23 04/12/2024    GLU 132 (H) 04/12/2024    BUN 19 04/12/2024    CREAT 0.73 04/12/2024    CA 9.2 04/12/2024    ANIONGAP 15.0 03/18/2024    EGFR 88 04/12/2024    PHOS 2.9 06/05/2018    AST 23 04/12/2024    ALT 15 04/12/2024    ALKPHOS 97 04/12/2024    ALB 4.3 04/12/2024    PROT 6.8 04/12/2024    GLOB 3.8 (H) 03/18/2024    AGRATIO 1.1 03/18/2024    BILITOTAL 0.3 04/12/2024     Lab Results   Component Value Date    CHOL 232 (H) 04/12/2024    TRIG 135 04/12/2024    HDL 53 04/12/2024    LDL 155 (H) 04/12/2024    LABVLDL 24 04/12/2024     CHOLHDLRATIO 4.4 04/12/2024     Lab Results   Component Value Date    TSH 2.10 11/17/2022    T4FREE 1.00 08/09/2022     No results found for: TESTOSTERFRE, TESTOSTERONE  No results found for: DHEA,  PROLACTIN, FSH, LH, ESTRADIOL  Lab Results   Component Value Date    VITD 15 (L) 11/17/2022    B12 511 10/29/2019    IRON 86 10/29/2019    TIBC 293 10/29/2019     Lab Results   Component Value Date    Vitamin D , 25 OH, Total 15 (L) 11/17/2022     Lab Results   Component Value Date    PTT 36 07/14/2021     Lab Results   Component Value Date    ALT 15 04/12/2024    AST 23 04/12/2024    ALKPHOS 97 04/12/2024    BILITOTAL 0.3 04/12/2024     Lab Results   Component Value Date    URMICROALB 7.0 09/01/2023    URMICROALB 9 09/01/2023   No components found for: URMICROALBCREAT    Lab Results   Component Value Date    HGBA1C 11.3 (H) 04/12/2024     TRENDS     Lab Results   Component Value Date    HGBA1C 11.3 (H) 04/12/2024    HGBA1C 10.9 (A) 03/18/2024    HGBA1C 10.6 (H) 12/05/2023     Lab Results   Component Value Date    CHOL 232 (H) 04/12/2024    CHOL 207 (H) 12/05/2023    CHOL 198 09/01/2023     Lab Results   Component Value Date    HDL 53 04/12/2024    HDL 51 12/05/2023    HDL 46 09/01/2023     Lab Results   Component Value Date    LDL 155 (H) 04/12/2024    LDL 129 (H) 12/05/2023    LDL 129 (H) 09/01/2023     Lab Results   Component Value Date    TRIG 135 04/12/2024    TRIG 136 12/05/2023    TRIG 114 09/01/2023     Lab Results   Component Value Date    LDL 155 (H) 04/12/2024    LDL 129 (H) 12/05/2023    LDL 129 (H) 09/01/2023     In Office/POCT Labs     Results for orders placed or performed in visit on 05/01/24 (from the past 24 hours)   POCT Glucose    Collection Time: 05/01/24 10:20 AM   Result Value    Whole Blood Glucose POCT 134 (A)     Results       Procedure Component Value Units Date/Time    POCT Glucose [[id number]]  (Abnormal) Collected: 05/01/24 1020     Updated: 05/01/24 1025     Whole Blood  Glucose POCT 134 mg/dL           CGM/PUMP/DATA/MEDIA       Vitals   BP (!) 157/94 (BP Site: Left arm, Patient Position: Sitting, Cuff Size: Medium)   Pulse 69   Temp 97.8 F (36.6 C) (Temporal)   Ht 1.676 m (5' 6)   Wt 90.9 kg (200 lb 6 oz)   BMI 32.34 kg/m          BP Readings from Last 3 Encounters:   05/01/24 (!) 157/94   04/12/24 (!) 168/95   03/18/24 (!) 164/104      Last 3 Weights for the past 72 hrs (Last 3 readings):   Weight   05/01/24 1013 90.9 kg (200 lb 6 oz)      Wt Readings from Last 4 Encounters:   05/01/24 1013 90.9 kg (200 lb 6 oz)   04/12/24 0807 90.3 kg (199 lb)  03/18/24 1153 90.4 kg (199 lb 6.4 oz)   01/12/24 0853 90.7 kg (200 lb)      Assessment/Plan:   Darya Bigler is a 73 y.o. female with a Body mass index is 32.34 kg/m. and a history of  type 2 diabetes  who presents forVisit type: FOLLOW UP   Ref: Claretta Melanee Ransom, MD   Latest Reference Range & Units 11/17/22 08:45 02/27/23 08:55 05/30/23 09:07 09/01/23 08:18 12/05/23 09:14 03/18/24 11:40 04/12/24 00:00   Hemoglobin A1C 4.8 - 5.6 % 9.1 (H) 9.2 (H) 9.4 (H) 9.7 (H) 10.6 (H) 10.9 ! 11.3 (H)       Her A1c continues to increasing A1c   She was recording her BG's. But her grandchildren erased it   No values present today==> Will have to rely on A1c     The mornig she reports her fasting to be 155==> Ate BF and and took lyumjev  5 units   BG in clinic is 134     She reports that Lyumjev  drops her BG's quickly   When takes insulin  it make der drowsy       Plan:  Her A1c is quite high, not matching FS  here in clinic  She afraid of lows. Seems that she corrects when she thinks she is low, but does bot check BG's at that time   She reports is she feels not good she will eat cookie, most likely resulting in spikes  She does not like the CGM, because it beeps too much which makers her anxiety worse   She is concerned about her weight       # Type 2 Diabetes Mellitus   Increase to Lymjev  7 with breakfast, Lunch, and Dinner: If  you do not eat a meal, do not take  Increase to Lantus  15 units unit twice daily, 12 hours apart   Advised to check sugars 4 times daily and return with blood sugar log/meter  Notify of extreme highs for increases to reach target range     # Obesity       03/18/2024 04/12/2024 05/01/2024   Weight Monitoring   Height 167.6 cm  167.6 cm   Weight 90.447 kg 90.266 kg 90.89 kg   BMI (calculated) 32.2 kg/m2  32.4 kg/m2      Noted=> patient has hx of intolerance to glp-1 ra   She wants to loose weight    She has been recommend calorie restricted diet and exercise 150 min/week combined cardio and strength training   Referred to CDE     # Hypertension   BP (!) 157/94 (BP Site: Left arm, Patient Position: Sitting, Cuff Size: Medium)   Pulse 69   Temp 97.8 F (36.6 C) (Temporal)   Ht 1.676 m (5' 6)   Wt 90.9 kg (200 lb 6 oz)   BMI 32.34 kg/m     High Blood Pressure: Continue to optimize low salt diet and aerobic exercise efforts. Recommend optimizing therapeutic lifestyle changes which include obtaining at least 150 minutes of aerobic exercise per week and eating a heart healthy diet ( i.e. DASH diet - www.heart.org or PopSteam.is ).  Continue Current medication: Verapamil  180 mg + Cozaar  100 mg at night   As per PCP     # Dyslipidemia    Latest Reference Range & Units 05/30/23 09:07   Cholesterol <=199 mg/dL 822   HDL >=59 mg/dL 50   LDL Calculated 0 - 129 mg/dL 894   Triglycerides 34 -  149 mg/dL 889   Cholesterol / HDL Ratio Index 3.5   VLDL Calculated 10 - 40 mg/dL 22     Current Lipid values within acceptable limits on appropriate intensity statin medication.    Noted history of elevated - LDL   Has hx of taking Repatha , reports he stopped Repatha  because it has increased sugars  On Simvastatin  20 mg QHS at present.  Given dietary adjustments.     Follow UP    Return in about 3 months (around 08/01/2024). Dr. Susi for continued management   Please follow up in 6 months with me   1. Uncontrolled type 2  diabetes mellitus with hyperglycemia (CMS/HCC)  - Follow Up In Endocrinology  - POCT Glucose; Future  - POCT Glucose       *A total of 30 minutes includes previsit review of patient's chart and medical records, visit discussion, patient education and counseling, post visit documentation of assessment and plan, placement of lab orders, imaging, referrals, prescriptions and coordination of care. >50% were in was spent discussing and counseling the patient on the various evaluative and therapeutic options, as discussed above.      *This note was generated by the Epic EMR system/ Dragon speech recognition and may contain inherent errors or omissions not intended by the user. Grammatical errors, random word insertions, deletions, pronoun errors and incomplete sentences are occasional consequences of this technology due to software limitations. Not all errors are caught or corrected. If there are questions or concerns about the content of this note or information contained within the body of this dictation they should be addressed directly with the author for clarification.*          [1]   Patient Active Problem List  Diagnosis    Uncontrolled type 2 diabetes mellitus with hyperglycemia (CMS/HCC)    Mixed hyperlipidemia    Obesity (BMI 30-39.9)    Neuropathy    Stroke (CMS/HCC)    Essential hypertension    Diabetic peripheral neuropathy (CMS/HCC)    History of hemorrhagic stroke with residual hemiparesis (CMS/HCC)    Pancreas cyst    Breast pain, left    Perforated abdominal viscus    Anemia, unspecified type    Pure hypercholesterolemia, unspecified    Bilateral carotid artery disease, unspecified type    Fibromyalgia muscle pain    Morbid (severe) obesity due to excess calories (CMS/HCC)    History of CVA (cerebrovascular accident)   [2]   Outpatient Medications Marked as Taking for the 05/01/24 encounter (Office Visit) with Aida Muskrat, FNP   Medication Sig Dispense Refill    aspirin  81 MG chewable tablet Chew 1  tablet (81 mg) by mouth daily      B Complex Vitamins (Vitamin-B Complex) Tablet Take by mouth once daily      BD Pen Needle Nano 2nd Gen 32G X 4 MM Misc USE AS DIRECTED DAILY WITH INSULIN  PEN 100 each 1    clobetasol (TEMOVATE) 0.05 % external solution APPLY TOPICALLY TO THE AFFECTED AREA DAILY AT BEDTIME AS NEEDED      Cyanocobalamin (VITAMIN B-12 PO) Take by mouth once daily      empagliflozin  (Jardiance ) 25 MG tablet Take 1 tablet (25 mg) by mouth daily 60 tablet 5    glucose blood test strip Check sugar three times a day, ICD 10 E11.9 100 each 10    hydrocortisone 2.5 % cream as needed      Insulin  Lispro-aabc, 1 U Dial, (Lyumjev  KwikPen) 100 UNIT/ML Solution  Pen-injector Inject 5 Units into the skin 3 (three) times daily Prior to meals (Patient taking differently: Inject 8 Units into the skin once daily Prior to meals) 15 mL 1    Lantus  SoloStar 100 UNIT/ML injection pen Inject 14 units into skin daily at bedtime 30 mL 1    LORazepam  (ATIVAN ) 0.5 MG tablet Take 0.5 tablets (0.25 mg) by mouth nightly as needed for Anxiety 20 tablet 0    losartan  (COZAAR ) 50 MG tablet TAKE 1 TABLET(50 MG) BY MOUTH TWICE DAILY 60 tablet 3    Multiple Vitamin (MULTI VITAMIN PO) Take by mouth DOISKA PLUS      OneTouch Delica Lancets 30G Misc Test sugars x2/ day 100 each 3    Pitavastatin  Calcium  1 MG Tablet ONE TABB PO QHS 90 tablet 0    triamcinolone (KENALOG) 0.1 % ointment APPLY TO THE AFFECTED AREA ON CHEST AND BACK TWICE DAILY. USE MAX 2 WEEKS PER MONTH. AVOID FACE UNDERARMS AND GROIN      UNABLE TO FIND SUGAR CONTROL-ORDERS ONLINE      UNABLE TO FIND DOISKA-MULTI VITAMIN      verapamil  (VERELAN ) 180 MG 24 hr ER capsule Take 1 capsule (180 mg) by mouth once daily 90 capsule 1    vitamin D , ergocalciferol , (DRISDOL ) 50000 UNIT Cap Take 1 capsule (50,000 Units) by mouth once a week 12 capsule 1   [3]   Allergies  Allergen Reactions    Codeine Nausea And Vomiting    Crestor  [Rosuvastatin ] Rash     D/c due to severe rash and  higher blood sugars    Glucosamine Hives and Nausea And Vomiting    Latex Rash    Tree Nuts Hives     ESPECIALLY ALMONDS     Almond Oil     Coconut (Cocos Nucifera)     Dairy [Milk-Related Compounds]     Egg-Derived Products      Patient had an allergy test and stated that she was allergic to eggs    Gabapentin      Dizziness and rash, irregular heart rate    Gluten Meal     Invokana  [Canagliflozin ]     Levemir  [Insulin  Detemir] Nausea And Vomiting    Lyrica [Pregabalin]      PT STATES IT MAKES HER NEUROPATHY WORSE STATES SHE FEELS LIKE SHE IS ON FIRE    Metformin       diarrhea    Novolog  [Insulin  Aspart (Human Analog)] Hives    Repatha  [Evolocumab ] Headaches    Sucralfate Nausea And Vomiting     BODY ACHES, FEVER    Tresiba  [Insulin  Degludec]     Victoza  [Liraglutide ] Nausea And Vomiting    Flaxseed [Flaxseed (Linseed)] Rash    Omega 3 [Fish Oil] Rash    Peanut-Containing Drug Products Rash    Repaglinide  Anxiety   [4]   Past Medical History:  Diagnosis Date    Anxiety     Asthma     allergic reaction to mold, not since 20 years, HAS WHEEZING IN COLD WEATHER. NO INHALERS    Bowel perforation (CMS/HCC)     COLONOSCOPY 09/2019    Depression     Diabetes (CMS/HCC)     Difficulty walking     WEAK RIGHT LEG AFTER CVA 2021    Dysphagia     BIG PILLS ONLY     Gastroesophageal reflux disease     PREVACID OTC PRN     Hyperlipemia     Hypertensive disorder  Hypokalemia     CMP REQ PCP 09/14/20    Neuropathy     NUMBNESS RIGHT UE AND LE     Pain     Legs/Arms/Back    Post-operative nausea and vomiting     ALWAYS WITH ANESTHESIA     Sleep apnea 2017    NO CPAP NEEDED PER PT     Stroke (CMS/HCC) 2012     ARM, LEG WEAK. WALKS WITH CANE, WALKER    TIA (transient ischemic attack)     MULTIPLE BEFORE CVA 2012    Type 2 diabetes mellitus, controlled (CMS/HCC) 2021    MANAGED BY DR CLARETTA RAO. AM FSB 117-140. TAKES LANTUS  PRN IN AM. HAIC 7.9    Urinary incontinence    [5]   Past Surgical History:  Procedure Laterality Date     BREAST BIOPSY Left     benign lump biopsy    COLONOSCOPY, DIAGNOSTIC (SCREENING)      X 2    COLONOSCOPY, DIAGNOSTIC (SCREENING) N/A 09/17/2020    Procedure: COLONOSCOPY;  Surgeon: Nemiah Lamar BIRCH, MD;  Location: [facility];  Service: Gastroenterology;  Laterality: N/A;    EGD N/A 10/28/2020    Procedure: EGD w/ bx's;  Surgeon: Marvis Waunita RAMAN, MD;  Location: [facility];  Service: Gastroenterology;  Laterality: N/A;    GALLBLADDER SURGERY  03/14/2023    HYSTERECTOMY      2005    OOPHORECTOMY Bilateral     2005    REDUCTION MAMMAPLASTY      TONSILLECTOMY      urinary bladder      BLADDER LIFT    [6]   Family History  Problem Relation Name Age of Onset    Heart attack Mother      Diabetes Father      Diabetes Maternal Grandmother      Stroke Maternal Grandmother      Stroke Maternal Grandfather      Breast cancer Daughter  30    Colon cancer Neg Hx     [7]   Social History  Tobacco Use    Smoking status: Never     Passive exposure: Never    Smokeless tobacco: Never   Vaping Use    Vaping status: Never Used   Substance Use Topics    Alcohol use: Never    Drug use: Never

## 2024-05-07 ENCOUNTER — Telehealth (HOSPITAL_BASED_OUTPATIENT_CLINIC_OR_DEPARTMENT_OTHER): Payer: Self-pay | Admitting: Internal Medicine

## 2024-05-11 ENCOUNTER — Other Ambulatory Visit (INDEPENDENT_AMBULATORY_CARE_PROVIDER_SITE_OTHER): Payer: Self-pay | Admitting: Family Medicine

## 2024-05-11 DIAGNOSIS — I10 Essential (primary) hypertension: Secondary | ICD-10-CM

## 2024-05-13 NOTE — Telephone Encounter (Signed)
 Medication(s) Requested: losartan .      Medication(s) last refilled: Jan 10 2024, Qty 60, Refills 3    Last visit: Apr 12 2024    Patient has an upcoming appointment on July 22 2024.

## 2024-05-13 NOTE — Telephone Encounter (Signed)
 Temporary refill provided to cover until appt.

## 2024-05-23 ENCOUNTER — Telehealth: Payer: Self-pay | Admitting: Family Nurse Practitioner

## 2024-05-23 NOTE — Telephone Encounter (Signed)
 Copied from CRM [id number]. Topic: Clinical Support - Prescription Refill  >> May 23, 2024  1:17 PM Tiffany S wrote:  Emily Rowe called about Clinical Support - Prescription Refill.  Additional details:  >> May 23, 2024  1:36 PM Tiffany S wrote:  Name, strength, directions of requested refill(s):    Glipizide  10 mg 2 tabs with breakfast     How much medication is remaining: (0)    Pharmacy to send refill to or patient to pick up rx from office (mark requested pharmacy in BOLD):      [email or web address] [name], [place] - [address] Phone: [phone]   Fax: [phone]          Please mark X next to the preferred call back number:    Mobile: [phone] (mobile) X   Home: [phone] (home)    Work: @WORKPHONE @        Medication refill request, see above. Thank you   Patient has been informed that medication refill requests should be called in up to one week prior to running out of medication.    Additional Notes: Keven from [facility] called in to get a refilled prescription of the above medication. Informed the agent this was not a medication on the patients current medication list and I would need to reach out to the [facility] clinical team for assistance. Reached out to the clinical team via SC, Reena Gwenn Ruth she informed me to get a good call back number she would need to reach out to the patient for clarity. The agents call back number is [phone] option 2    LOV: 05/01/2024  Next Visit: unknown

## 2024-05-24 ENCOUNTER — Encounter (INDEPENDENT_AMBULATORY_CARE_PROVIDER_SITE_OTHER): Payer: Self-pay

## 2024-05-24 NOTE — Telephone Encounter (Signed)
 MA called pt. Left a VM. MA called to get confirmation pt is taking this medication. Waiting for a call back from pt.

## 2024-05-28 ENCOUNTER — Other Ambulatory Visit (INDEPENDENT_AMBULATORY_CARE_PROVIDER_SITE_OTHER): Payer: Self-pay | Admitting: Family Medicine

## 2024-05-28 ENCOUNTER — Other Ambulatory Visit (HOSPITAL_BASED_OUTPATIENT_CLINIC_OR_DEPARTMENT_OTHER): Payer: Self-pay | Admitting: Family Nurse Practitioner

## 2024-05-28 DIAGNOSIS — I10 Essential (primary) hypertension: Secondary | ICD-10-CM

## 2024-05-28 DIAGNOSIS — E1165 Type 2 diabetes mellitus with hyperglycemia: Secondary | ICD-10-CM

## 2024-05-28 MED ORDER — LOSARTAN POTASSIUM 50 MG PO TABS
50.0000 mg | ORAL_TABLET | Freq: Two times a day (BID) | ORAL | 5 refills | Status: AC
Start: 2024-05-28 — End: ?

## 2024-05-28 NOTE — Telephone Encounter (Signed)
 Medication(s) Requested:  losartan  (COZAAR ) 50 MG tablet       Medication(s) last refilled: May 13 2024, Qty 60, Refills 0    Last visit: Apr 12 2024    Patient has an upcoming appointment on July 22 2024.

## 2024-06-21 ENCOUNTER — Telehealth (HOSPITAL_BASED_OUTPATIENT_CLINIC_OR_DEPARTMENT_OTHER): Payer: Self-pay

## 2024-06-21 NOTE — Telephone Encounter (Signed)
 Called pt to r/s 10/16 appt with Keisha to 10/13 @ 9 am instead due to the provider being on PTO. No answer, [email or web address].

## 2024-06-27 ENCOUNTER — Encounter (INDEPENDENT_AMBULATORY_CARE_PROVIDER_SITE_OTHER): Payer: Self-pay

## 2024-07-01 ENCOUNTER — Encounter (INDEPENDENT_AMBULATORY_CARE_PROVIDER_SITE_OTHER): Payer: Self-pay

## 2024-07-17 ENCOUNTER — Ambulatory Visit (HOSPITAL_BASED_OUTPATIENT_CLINIC_OR_DEPARTMENT_OTHER): Admitting: Internal Medicine

## 2024-07-22 ENCOUNTER — Ambulatory Visit (INDEPENDENT_AMBULATORY_CARE_PROVIDER_SITE_OTHER): Admitting: Family Medicine

## 2024-07-24 ENCOUNTER — Other Ambulatory Visit

## 2024-07-24 DIAGNOSIS — E1165 Type 2 diabetes mellitus with hyperglycemia: Secondary | ICD-10-CM

## 2024-07-24 DIAGNOSIS — Z794 Long term (current) use of insulin: Secondary | ICD-10-CM

## 2024-07-25 ENCOUNTER — Ambulatory Visit (INDEPENDENT_AMBULATORY_CARE_PROVIDER_SITE_OTHER): Admitting: Family Medicine

## 2024-07-25 ENCOUNTER — Encounter (INDEPENDENT_AMBULATORY_CARE_PROVIDER_SITE_OTHER): Payer: Self-pay | Admitting: Family Medicine

## 2024-07-25 VITALS — BP 146/82 | HR 73 | Temp 97.9°F | Resp 16 | Wt 199.8 lb

## 2024-07-25 DIAGNOSIS — I10 Essential (primary) hypertension: Secondary | ICD-10-CM

## 2024-07-25 DIAGNOSIS — E1142 Type 2 diabetes mellitus with diabetic polyneuropathy: Secondary | ICD-10-CM

## 2024-07-25 DIAGNOSIS — Z8673 Personal history of transient ischemic attack (TIA), and cerebral infarction without residual deficits: Secondary | ICD-10-CM

## 2024-07-25 DIAGNOSIS — E782 Mixed hyperlipidemia: Secondary | ICD-10-CM

## 2024-07-25 DIAGNOSIS — K862 Cyst of pancreas: Secondary | ICD-10-CM

## 2024-07-25 LAB — HEMOGLOBIN A1C: Hemoglobin A1C: 10.6 % — ABNORMAL HIGH (ref 4.8–5.6)

## 2024-07-25 NOTE — Progress Notes (Addendum)
 Have you seen any specialists/other providers since your last visit with us ?    No    Arm preference verified?   Yes    Health Maintenance Due   Topic Date Due    Advance Directive on File  Never done    Shingrix Vaccine 50+ (1) Never done    Tetanus Ten-Year  09/04/2022    Influenza Vaccine  05/17/2024         Emily Rowe is a 73 y.o. female who presents for Diabetes Follow-up (A1C up to date-will get flu shot when she returns for lab appointment )   History of Present Illness  Emily Rowe is a 73 year old female with a history of strokes, diabetes, and hypertension who presents with balance issues and fatigue.    She experiences significant fatigue and balance issues, describing her gait as similar to a baby learning to walk. Poor balance has persisted since her strokes, making exercise challenging. Persistent fatigue and body aches are present. Blood pressure remains elevated at approximately 140/80 mmHg despite regular medication. Blood pressure decreases when blood sugar is low. Current medications include Lantus  14 units daily, Lispro 7 units three times a day with meals, and Jardiance  25 mg daily. Recent A1c is 10.6, down from 11.3.    She has a history of depression, worsened by her sister's death. She has tried multiple antidepressants, including Celexa and Cymbalta , but is not currently on any medication for depression. She feels dizzy and weak, sometimes unable to get up from lying down, with no suicidal ideation.    She experiences widespread body pain and has a history of fibromyalgia. She describes episodes of feeling extremely cold followed by intense heat, with sensitivity to touch. Stress is believed to contribute to elevated blood sugar levels. She manages diabetes with insulin  injections, which have caused bruising. She has tried various medications, including GLP-1 agonists and Invokana , but experienced allergies and no significant improvement. Symptoms have not improved with  the current treatment regimen.    She lives alone and manages daily activities independently, including maintaining a garden. Despite fatigue and balance issues, she engages in physical activity by walking and gardening.      Verbal consent obtained to record this visit.     Diabetes Follow-up           The other 14 point review systems are negative except those in HPI.    Current Medications:  Medications Taking[1]    Past Medical History:  Medical History[2]    Past Surgical History:  Past Surgical History[3]    Family History:  Family History[4]    Social History:   reports that she has never smoked. She has never been exposed to tobacco smoke. She has never used smokeless tobacco. She reports that she does not drink alcohol and does not use drugs.    Allergies:  Allergies[5]     The following sections were reviewed this encounter by the provider:   Tobacco  Allergies  Meds  Problems  Med Hx  Surg Hx  Fam Hx         VITALS:  Vitals:    07/25/24 1554 07/25/24 1556   BP: 152/84 146/82   BP Site: Left arm Left arm   Patient Position: Sitting Sitting   Cuff Size: Large Large   Pulse: 73    Resp: 16    Temp: 97.9 F (36.6 C)    TempSrc: Oral    SpO2: 94%  Weight: 90.6 kg (199 lb 12.8 oz)             Review of Systems   Constitutional:  Positive for malaise/fatigue. Negative for chills and fever.   HENT:  Negative for congestion, ear pain and sore throat.    Eyes:  Negative for blurred vision, double vision and pain.   Respiratory:  Negative for cough, shortness of breath and wheezing.    Cardiovascular:  Negative for chest pain, palpitations and leg swelling.   Gastrointestinal:  Negative for abdominal pain, diarrhea, heartburn, nausea and vomiting.   Genitourinary:  Negative for dysuria, hematuria and urgency.   Musculoskeletal:  Negative for joint pain and myalgias.   Skin:  Negative for rash.   Neurological:  Positive for weakness. Negative for dizziness and headaches.   Psychiatric/Behavioral:  Positive  for depression. Negative for hallucinations, substance abuse and suicidal ideas. The patient is nervous/anxious. The patient does not have insomnia.           Physical Exam  Vitals and nursing note reviewed.   Constitutional:       General: She is not in acute distress.     Appearance: Normal appearance.   HENT:      Nose: Nose normal. No congestion.      Mouth/Throat:      Mouth: Mucous membranes are moist.      Pharynx: Oropharynx is clear.   Eyes:      General: No scleral icterus.     Conjunctiva/sclera: Conjunctivae normal.      Pupils: Pupils are equal, round, and reactive to light.   Cardiovascular:      Rate and Rhythm: Normal rate and regular rhythm.      Heart sounds: No murmur heard.  Pulmonary:      Effort: Pulmonary effort is normal. No respiratory distress.      Breath sounds: Normal breath sounds. No wheezing or rhonchi.   Abdominal:      General: Abdomen is flat. Bowel sounds are normal.   Musculoskeletal:      Right lower leg: No edema.      Left lower leg: No edema.      Comments: Right-sided weakness more pronounced than the left bilateral weakness because of her stroke   Neurological:      Mental Status: She is alert.      Motor: Weakness present.      Gait: Gait abnormal.   Psychiatric:         Mood and Affect: Mood normal.   Physical Exam  VITALS: BP- 168/90, SaO2- 94%  GENERAL: Alert, no acute distress.  HEENT: Right eye bloodshot.  EXTREMITIES: Leg swelling.    Physical Exam  VITALS: BP- 146/82        Lab Results   Component Value Date    WBC 6.08 12/05/2023    HGB 12.9 12/05/2023    HCT 41.5 12/05/2023    PLT 143 12/05/2023    CHOL 232 (H) 04/12/2024    TRIG 135 04/12/2024    HDL 53 04/12/2024    LDL 155 (H) 04/12/2024    ALT 15 04/12/2024    AST 23 04/12/2024    NA 141 04/12/2024    K 4.3 04/12/2024    CL 100 04/12/2024    CREAT 0.73 04/12/2024    BUN 19 04/12/2024    CO2 23 04/12/2024    TSH 2.10 11/17/2022    INR 1.0 07/14/2021    GLU 132 (H) 04/12/2024  HGBA1C 10.6 (H) 07/24/2024        Results  LABS  A1c: 10.6% (07/24/2024)  C-peptide: 1.14 ng/mL    Assessment/Plan:         1. Essential hypertension  - Comprehensive Metabolic Panel; Future  - Lipid Panel; Future  - Urine Microalbumin, Random; Future    2. History of CVA (cerebrovascular accident)  - Comprehensive Metabolic Panel; Future  - Lipid Panel; Future  - Urine Microalbumin, Random; Future    3. Mixed hyperlipidemia  - Comprehensive Metabolic Panel; Future  - Lipid Panel; Future  - Urine Microalbumin, Random; Future    4. Diabetic peripheral neuropathy (CMS/HCC)  - Comprehensive Metabolic Panel; Future  - Lipid Panel; Future  - Urine Microalbumin, Random; Future    5. Pancreas cyst  - MRI Abdomen W WO Contrast Mrcp; Future         Assessment & Plan  Type 2 diabetes mellitus, poorly controlled  A1c improved to 10.6 from 11.3 but remains poorly controlled. Difficulty managing blood glucose despite medication adjustments.  - Continue Lantus  14 units and Lispro 7 units TID with meals.  - Continue Jardiance  25 mg.  - Follow up with endocrinologist next week and in November.  - Order liver, kidney, and cholesterol tests.  Diabetes complicated with neuropathy in addition to significant neuropathy post strokes she has had  She has tried multiple medications including gabapentin and pregabalin, Cymbalta  with no major improvement  She is seeing podiatrist  Denies having any open sores  Importance of monitoring discussed  Condition at this time is stable      Hypertension  Hypertension suboptimally controlled with readings >140/80 mmHg. Complicated by emotional state and diabetes.  - Continue losartan  50 mg and verapamil  180 mg.  - Monitor blood pressure at home.  - Discuss management with endocrinologist.    Depression  Chronic depression significantly impacts functioning. No current medication. Emotional distress from sister's loss.  - Discuss re-initiation of antidepressant therapy.  - Encourage follow-up with mental health  professional.    Fibromyalgia  Chronic fibromyalgia with stress-exacerbated pain affecting daily activities.  - Encourage physical activity as tolerated.    Cerebrovascular accident with residual balance impairment  Residual balance impairment post-CVA affects mobility and daily activities.  - Encourage continuation of physical therapy.  - Follow up with neurologist, Dr. Alethea.    Pancreatic cyst, under surveillance  Pancreatic cyst under surveillance. Reports nausea and significant constipation.  - Order MRI abdomen with and without contrast with MRCP.    Constipation  Chronic constipation affects dietary intake.    Status post cholecystectomy  Post-cholecystectomy with digestive issues and chronic constipation.  - Discuss potential use of cholestyramine if loose stools develop.  - Monitor and adjust dietary intake.    General Health Maintenance  Due for flu shot, mammogram, and neck ultrasound for thyroid  evaluation.  - Administer flu shot during next visit.  - Schedule neck ultrasound for thyroid  evaluation.      Follow Up In Primary Care          Routine, Future, Expected: 12/05/2024, Expires: 10/25/2025 Select the type of follow up: Medicare Wellness Who is this follow-up with? Me Who is this follow-up with? Me     Verbal consent obtained to record this visit.     Emily ONEIDA Commodore, MD           [1]   Outpatient Medications Marked as Taking for the 07/25/24 encounter (Office Visit) with Akera Snowberger T, MD  Medication Sig Dispense Refill    aspirin  81 MG chewable tablet Chew 1 tablet (81 mg) by mouth daily      B Complex Vitamins (Vitamin-B Complex) Tablet Take by mouth once daily      clobetasol (TEMOVATE) 0.05 % external solution APPLY TOPICALLY TO THE AFFECTED AREA DAILY AT BEDTIME AS NEEDED      Cyanocobalamin (VITAMIN B-12 PO) Take by mouth once daily      empagliflozin  (Jardiance ) 25 MG tablet Take 1 tablet (25 mg) by mouth daily 60 tablet 5    glucose blood (Accu-Chek Guide Test) test strip  Type 2 Diabetes Mellitus, insulin  requiring(ICD-10 E11.9) Check fingerstick glucose AC and HS 400 strip 3    glucose blood test strip Check sugar three times a day, ICD 10 E11.9 100 each 10    hydrocortisone 2.5 % cream as needed      Insulin  Lispro-aabc, 1 U Dial, (Lyumjev  KwikPen) 100 UNIT/ML Solution Pen-injector Inject 7 Units into the skin once daily Prior to meals 15 mL 3    Insulin  Pen Needle (BD Pen Needle Nano 2nd Gen) 32G X 4 MM Misc Type 2 Diabetes Mellitus, insulin  requiring(ICD-10 E11.9) Check fingerstick glucose AC and HS 200 each 11    Lancets (accu-chek safe-t pro) lancets Type 2 Diabetes Mellitus, insulin  requiring(ICD-10 E11.9) Check fingerstick glucose AC and HS 200 each 5    Lantus  SoloStar 100 UNIT/ML injection pen Inject 16 Units into the skin once at bedtime (Patient taking differently: Inject 14 Units into the skin once at bedtime) 15 mL 3    LORazepam  (ATIVAN ) 0.5 MG tablet Take 0.5 tablets (0.25 mg) by mouth nightly as needed for Anxiety 20 tablet 0    losartan  (COZAAR ) 50 MG tablet Take 1 tablet (50 mg) by mouth 2 (two) times daily 60 tablet 5    OneTouch Delica Lancets 30G Misc Test sugars x2/ day 100 each 3    Pitavastatin  Calcium  1 MG Tablet ONE TABB PO QHS 90 tablet 0    triamcinolone (KENALOG) 0.1 % ointment APPLY TO THE AFFECTED AREA ON CHEST AND BACK TWICE DAILY. USE MAX 2 WEEKS PER MONTH. AVOID FACE UNDERARMS AND GROIN      UNABLE TO FIND DOISKA-MULTI VITAMIN      verapamil  (VERELAN ) 180 MG 24 hr ER capsule Take 1 capsule (180 mg) by mouth once daily 90 capsule 1    vitamin D , ergocalciferol , (DRISDOL ) 50000 UNIT Cap Take 1 capsule (50,000 Units) by mouth once a week 12 capsule 1   [2]   Past Medical History:  Diagnosis Date    Anxiety     Asthma     allergic reaction to mold, not since 20 years, HAS WHEEZING IN COLD WEATHER. NO INHALERS    Bowel perforation (CMS/HCC)     COLONOSCOPY 09/2019    Depression     Diabetes (CMS/HCC)     Difficulty walking     WEAK RIGHT LEG AFTER CVA  2021    Dysphagia     BIG PILLS ONLY     Gastroesophageal reflux disease     PREVACID OTC PRN     Hyperlipemia     Hypertensive disorder     Hypokalemia     CMP REQ PCP 09/14/20    Neuropathy     NUMBNESS RIGHT UE AND LE     Pain     Legs/Arms/Back    Post-operative nausea and vomiting     ALWAYS WITH ANESTHESIA     Sleep  apnea 2017    NO CPAP NEEDED PER PT     Stroke (CMS/HCC) 2012     ARM, LEG WEAK. WALKS WITH CANE, WALKER    TIA (transient ischemic attack)     MULTIPLE BEFORE CVA 2012    Type 2 diabetes mellitus, controlled (CMS/HCC) 2021    MANAGED BY DR CLARETTA RAO. AM FSB 117-140. TAKES LANTUS  PRN IN AM. HAIC 7.9    Urinary incontinence    [3]   Past Surgical History:  Procedure Laterality Date    BREAST BIOPSY Left     benign lump biopsy    COLONOSCOPY, DIAGNOSTIC (SCREENING)      X 2    COLONOSCOPY, DIAGNOSTIC (SCREENING) N/A 09/17/2020    Procedure: COLONOSCOPY;  Surgeon: Nemiah Lamar BIRCH, MD;  Location: [facility];  Service: Gastroenterology;  Laterality: N/A;    EGD N/A 10/28/2020    Procedure: EGD w/ bx's;  Surgeon: Marvis Waunita RAMAN, MD;  Location: [facility];  Service: Gastroenterology;  Laterality: N/A;    GALLBLADDER SURGERY  03/14/2023    HYSTERECTOMY      2005    OOPHORECTOMY Bilateral     2005    REDUCTION MAMMAPLASTY      TONSILLECTOMY      urinary bladder      BLADDER LIFT    [4]   Family History  Problem Relation Name Age of Onset    Heart attack Mother      Diabetes Father      Diabetes Maternal Grandmother      Stroke Maternal Grandmother      Stroke Maternal Grandfather      Breast cancer Daughter  30    Colon cancer Neg Hx     [5]   Allergies  Allergen Reactions    Codeine Nausea And Vomiting    Crestor  [Rosuvastatin ] Rash     D/c due to severe rash and higher blood sugars    Glucosamine Hives and Nausea And Vomiting    Latex Rash    Tree Nuts Hives     ESPECIALLY ALMONDS     Almond Oil     Coconut (Cocos Nucifera)     Dairy [Milk-Related Compounds]     Egg-Derived Products       Patient had an allergy test and stated that she was allergic to eggs    Gabapentin      Dizziness and rash, irregular heart rate    Gluten Meal     Invokana  [Canagliflozin ]     Levemir  [Insulin  Detemir] Nausea And Vomiting    Lyrica [Pregabalin]      PT STATES IT MAKES HER NEUROPATHY WORSE STATES SHE FEELS LIKE SHE IS ON FIRE    Metformin       diarrhea    Novolog  [Insulin  Aspart (Human Analog) (Yeast)] Hives    Repatha  [Evolocumab ] Headaches    Sucralfate Nausea And Vomiting     BODY ACHES, FEVER    Tresiba  [Insulin  Degludec]     Victoza  [Liraglutide ] Nausea And Vomiting    Flaxseed [Flaxseed (Linseed)] Rash    Omega 3 [Fish Oil] Rash    Peanut-Containing Drug Products Rash    Repaglinide  Anxiety

## 2024-07-29 ENCOUNTER — Ambulatory Visit (HOSPITAL_BASED_OUTPATIENT_CLINIC_OR_DEPARTMENT_OTHER): Admitting: Family Nurse Practitioner

## 2024-07-29 ENCOUNTER — Encounter (HOSPITAL_BASED_OUTPATIENT_CLINIC_OR_DEPARTMENT_OTHER): Payer: Self-pay | Admitting: Family Nurse Practitioner

## 2024-07-29 ENCOUNTER — Telehealth (HOSPITAL_BASED_OUTPATIENT_CLINIC_OR_DEPARTMENT_OTHER): Payer: Self-pay | Admitting: Family Nurse Practitioner

## 2024-07-29 VITALS — BP 154/90 | HR 76 | Temp 97.5°F | Resp 18 | Ht 66.0 in | Wt 200.0 lb

## 2024-07-29 DIAGNOSIS — Z794 Long term (current) use of insulin: Secondary | ICD-10-CM

## 2024-07-29 DIAGNOSIS — E669 Obesity, unspecified: Secondary | ICD-10-CM

## 2024-07-29 DIAGNOSIS — I10 Essential (primary) hypertension: Secondary | ICD-10-CM

## 2024-07-29 DIAGNOSIS — E1165 Type 2 diabetes mellitus with hyperglycemia: Secondary | ICD-10-CM

## 2024-07-29 DIAGNOSIS — E782 Mixed hyperlipidemia: Secondary | ICD-10-CM

## 2024-07-29 LAB — POCT GLUCOSE: Whole Blood Glucose POCT: 131 mg/dL — AB (ref 70–100)

## 2024-07-29 MED ORDER — SITAGLIPTIN PHOSPHATE 25 MG PO TABS
25.0000 mg | ORAL_TABLET | Freq: Every day | ORAL | 11 refills | Status: AC
Start: 2024-07-29 — End: 2025-07-29

## 2024-07-29 NOTE — Progress Notes (Signed)
 Date Time: @IPTODAYDATE @ 9:35 AM  Patient Name: Emily Rowe  Attending Physician: No att. providers found    Date: 07/29/2024 9:35 AM   Patient ID: Emily Rowe is a 73 y.o. female.  Chief Complaint:     Chief Complaint   Patient presents with    T2DM     Follow up:   BS  131  (Non-fasting)  A1c  10.6  (07-24-24)      History of Present Illness:   Emily Rowe is a 73 y.o. female with a Body mass index is 32.28 kg/m. and a history of type 2 diabetes  who presents for management of diabetes mellitus   Visit type: FOLLOW UP   Ref:      Diabete   Type: Type II and non-insulin  requiring  Current symptoms: none  Complications: without complications  Control: 9.1%   Comorbid illness: hyperlipidemia      Pertinent History/Interval:    Intolerance to Victoza  and potential risk with Ozempic.       Last follow-up: 05/01/2024 with me; Also see's Dr. KANDICE   AT LAST VISIT:   PLAN==>> - Increase to Lymjev  7 with breakfast, Lunch, and Dinner: If you do not eat a meal, do not take  Increase to Lantus  16 units at bedtime   Cont. Jardaince 25 mg daily     TODAY   She is on the above regimen     No BG log today.  She remembers ==> fasting 121,   Lunch time she reports to be 149 to 145   Dinner time avoiding dinner. But she will have oatmeal    She reports that her diet is the same. She avoid eating rice     She has hx of gallbladder disease.    Diabetes Medication Regimen:  Daily basal insulin  dosing: Lantus  10 units at night or every other day   Daily bolus insulin  dosing: non-insulin  requiring  Glipizide  10 mg 2 tabs with breakfast  Jardiance  10 daily   Lab Results   Component Value Date    HGBA1C 10.6 (H) 07/24/2024       Previous Medications:   Metformin  Unable to tolerate due to diarrhea   Victoza  Unable to tolerate n/v,   Levemir  (caused nausea and vomiting),   Tresiba  (did not tolerate, not sure reason for intolerance)   Repaglinide  (anxiety)     Protection Meds:   Statin:   Simvastatin  20 mg    ACE-I/ARB:        Self-Monitoring Blood Glucose:  Checks Blood Sugars: Yes [x]   No []   Home Glucose Readings:   Fasting:   Before Lunch:  Before Dinner:  Bedtime:  After meals:   Hypoglycemia: Yes []   No []     24 hour Meals Recall:  Diet: eating sweet potato and avocado everyday.   Breakfast : moderate compliance with recommended diet;   Lunch :    Dinner :    Snack :    Beverages:   Exercise: walks occasionally     Diabetes Complications:  Ophthalmology Diabetic Screening: last retinal exam was >1 year ago  Ophthalmology: no retinopathy, no glaucoma, no cataracts,  Podiatric Diabetic Screening: last retinal exam was >1 year ago  Podiatry/Neuropathy Podiatric     PROBLEM LIST   Problem List[1]  CURRENT MEDICATIONS   Medications Taking[2]   ALLERGIES   Allergies[3]  PAST MEDICAL HISTORY   Medical History[4]  PAST SURGICAL HISTORY   Past Surgical History[5]  FAMILY HISTORY  Family History[6]  SOCIAL HISTORY   Social History[7]     ROS     ROS: A complete 12 point ROS was obtained. Pertinent positives and negatives as noted in HPI. All other systems are negative.     General/Constitutional:  Denies Chills. Denies Fatigue. Denies Fever.   Ophthalmologic: Denies Blurred vision. Denies Eye Pain.   ENT: Denies Nasal Discharge. Denies Ear pain. Denies Sinus pain.   Endocrine: Denies Polydipsia. Denies Polyuria.   Respiratory: Denies Cough. Denies Orthopnea. Denies Shortness of breath. Denies Wheezing.   Cardiovascular: Denies Chest pain. Denies Chest pain with exertion. Denies Leg Claudication. Denies Palpitations. Denies Swelling in hands/feet.   Gastrointestinal: Denies Abdominal pain. Denies Blood in stool. Denies Constipation. Denies Diarrhea.Denies Heartburn. Denies Nausea. Denies Vomiting.   Genitourinary: Denies Blood in urine. Denies Frequent urination. Denies Painful urination.   Musculoskeletal: Denies Leg cramps. Denies Muscle aches.   Skin: Denies Skin lesion(s).   Neurologic: Denies Dizziness. Denies Gait abnormality.  Denies Headache. Denies Tingling/Numbness.     Objective/Physical Exam      GENERAL: alert, in no acute distress, well developed, well nourished,oriented to time, place, and person.   NOSE: normal nasal mucosa.   ORAL CAVITY: normal oropharynx, normal lips, mucosa moist.   THROAT: normal appearance, clear.   NECK/THYROID : neck supple,  no cervical lymphadenopathy,no thyromegaly.  LUNGS: normal effort / no distress, normal breath sounds, clear to auscultation bilaterally, no wheezes.  HEART: S1, S2 normal, no murmurs, regular rate and rhythm.   ABD: Soft, non-distended. Non-tender to palpation. Normoactive bowel sounds.  MUSCK: No joint erythema or edema.  EXTREMITIES: no clubbing, cyanosis, or edema B/L.   LYMPH: No large cervical lymph nodes.  SKIN: Warm, well perfused. No rashes; adequate skin turgor. axillae  PSYCH: Alert, appropriately interactive for age.  PODIATRIC: Diabetes Foot Exam Performed (visual , sensory and pulse):    Monofilament testing of plantar aspect of first toe and metatarsal joints (10g monofilament):  Intact bilaterally, Proporieation  RAD's      No results found.    Labs:     Lab Results   Component Value Date    WBC 6.08 12/05/2023    HGB 12.9 12/05/2023    HCT 41.5 12/05/2023    RBC 4.65 12/05/2023    PLT 143 12/05/2023    MCV 89.2 12/05/2023    MCH 27.7 12/05/2023    MCHC 31.1 (L) 12/05/2023    RDW 13 12/05/2023    MPV 13.9 (H) 12/05/2023     Lab Results   Component Value Date    NA 141 04/12/2024    K 4.3 04/12/2024    CL 100 04/12/2024    CO2 23 04/12/2024    GLU 132 (H) 04/12/2024    BUN 19 04/12/2024    CREAT 0.73 04/12/2024    CA 9.2 04/12/2024    ANIONGAP 15.0 03/18/2024    EGFR 88 04/12/2024    PHOS 2.9 06/05/2018    AST 23 04/12/2024    ALT 15 04/12/2024    ALKPHOS 97 04/12/2024    ALB 4.3 04/12/2024    PROT 6.8 04/12/2024    GLOB 3.8 (H) 03/18/2024    AGRATIO 1.1 03/18/2024    BILITOTAL 0.3 04/12/2024     Lab Results   Component Value Date    CHOL 232 (H) 04/12/2024    TRIG 135  04/12/2024    HDL 53 04/12/2024    LDL 155 (H) 04/12/2024    LABVLDL 24 04/12/2024  CHOLHDLRATIO 4.4 04/12/2024     Lab Results   Component Value Date    TSH 2.10 11/17/2022    T4FREE 1.00 08/09/2022     No results found for: TESTOSTERFRE, TESTOSTERONE  No results found for: DHEA, PROLACTIN, FSH, LH, ESTRADIOL  Lab Results   Component Value Date    VITD 15 (L) 11/17/2022    B12 511 10/29/2019    IRON 86 10/29/2019    TIBC 293 10/29/2019     Lab Results   Component Value Date    Vitamin D , 25 OH, Total 15 (L) 11/17/2022     Lab Results   Component Value Date    PTT 36 07/14/2021     Lab Results   Component Value Date    ALT 15 04/12/2024    AST 23 04/12/2024    ALKPHOS 97 04/12/2024    BILITOTAL 0.3 04/12/2024     Lab Results   Component Value Date    URMICROALB 7.0 09/01/2023    URMICROALB 9 09/01/2023   No components found for: URMICROALBCREAT    Lab Results   Component Value Date    HGBA1C 10.6 (H) 07/24/2024     TRENDS     Lab Results   Component Value Date    HGBA1C 10.6 (H) 07/24/2024    HGBA1C 11.3 (H) 04/12/2024    HGBA1C 10.9 (A) 03/18/2024     Lab Results   Component Value Date    CHOL 232 (H) 04/12/2024    CHOL 207 (H) 12/05/2023    CHOL 198 09/01/2023     Lab Results   Component Value Date    HDL 53 04/12/2024    HDL 51 12/05/2023    HDL 46 09/01/2023     Lab Results   Component Value Date    LDL 155 (H) 04/12/2024    LDL 129 (H) 12/05/2023    LDL 129 (H) 09/01/2023     Lab Results   Component Value Date    TRIG 135 04/12/2024    TRIG 136 12/05/2023    TRIG 114 09/01/2023     Lab Results   Component Value Date    LDL 155 (H) 04/12/2024    LDL 129 (H) 12/05/2023    LDL 129 (H) 09/01/2023     In Office/POCT Labs     Results for orders placed or performed in visit on 07/29/24 (from the past 24 hours)   POCT Glucose    Collection Time: 07/29/24  9:00 AM   Result Value    Whole Blood Glucose POCT 131 (A)     Results       Procedure Component Value Units Date/Time    POCT Glucose [[id number]]   (Abnormal) Collected: 07/29/24 0900    Specimen: Capillary Blood Updated: 07/29/24 0921     Whole Blood Glucose POCT 131 mg/dL           CGM/PUMP/DATA/MEDIA       Vitals   BP 154/90 (BP Site: Left arm, Patient Position: Sitting, Cuff Size: Medium)   Pulse 76   Temp 97.5 F (36.4 C) (Oral)   Resp 18   Ht 1.676 m (5' 6)   Wt 90.7 kg (200 lb)   SpO2 95%   BMI 32.28 kg/m          BP Readings from Last 3 Encounters:   07/29/24 154/90   07/25/24 146/82   05/01/24 (!) 157/94      Last 3 Weights for the past 72  hrs (Last 3 readings):   Weight   07/29/24 0910 90.7 kg (200 lb)      Wt Readings from Last 4 Encounters:   07/29/24 0910 90.7 kg (200 lb)   07/25/24 1554 90.6 kg (199 lb 12.8 oz)   05/01/24 1013 90.9 kg (200 lb 6 oz)   04/12/24 0807 90.3 kg (199 lb)      Assessment/Plan:   Emily Rowe is a 73 y.o. female with a Body mass index is 32.28 kg/m. and a history of  type 2 diabetes  who presents for  Visit type: FOLLOW UP   Ref:     Latest Reference Range & Units 12/05/23 09:14 03/18/24 11:40 04/12/24 00:00 07/24/24 08:29   Hemoglobin A1C 4.8 - 5.6 % 10.6 (H) 10.9 ! 11.3 (H) 10.6 (H)     Patient presents for follow-ups.  Continues on basal bolus insulin  + Jardiance   A1c is continue to be high, but has come down slightly since last visit    She reports that her blood sugars are lower at home, but she does not understand why A1c is still high  She does report trying to avoid rice.    She eats breakfast and lunch, but will have oatmeal with dinner      Plan:  She has multiple allergies and intolerance to meds   She has tried Januvia before, but say it did not work. She denies any allergies to this med   We will try this medicine to see if this will help with blood sugars.  She reports she is taking up to 100 mg before    We discussed at length about trying the new GLP-1's   She had a severe reaction to Victoza  requiring hospitalization.   Possible micro dosing on Ozempic, counting clicks, are an option;  she will meet with Dr. Susi next month   Possible appeite supressants     # Type 2 Diabetes Mellitus   Check blood sugars 4 times a day: Before breakfast, lunch, dinner and bedtime: Record for dosage adjustments  START Januvia 25 mg daily   Cont. Lymjev  7 with breakfast, Lunch, and Dinner: If you do not eat a meal, do not take  Cont. Lantus  16 units at bedtime   Cont. Jardiance  25 mg daily   Advised to check sugars 4 times daily and return with blood sugar log/meter      # Obesity       05/01/2024 07/25/2024 07/29/2024   Weight Monitoring   Height 167.6 cm  167.6 cm   Weight 90.89 kg 90.629 kg 90.719 kg   BMI (calculated) 32.4 kg/m2  32.3 kg/m2      Noted=> patient has hx of intolerance to glp-1 ra   She wants to loose weight    Continue to recommend diet and exercise.  Referral to Weight loss and Bariatric: Can discuss other options    # Hypertension   BP 154/90 (BP Site: Left arm, Patient Position: Sitting, Cuff Size: Medium)   Pulse 76   Temp 97.5 F (36.4 C) (Oral)   Resp 18   Ht 1.676 m (5' 6)   Wt 90.7 kg (200 lb)   SpO2 95%   BMI 32.28 kg/m     Continue Current medication: Verapamil  180 mg + Cozaar  100 mg at night     Continue to optimize low salt diet and aerobic exercise efforts. Recommend optimizing therapeutic lifestyle changes which include obtaining at least 150 minutes of aerobic exercise  per week and eating a heart healthy diet ( i.e. DASH diet - www.heart.org or PopSteam.is ).      # Dyslipidemia    Latest Reference Range & Units 05/30/23 09:07   Cholesterol <=199 mg/dL 822   HDL >=59 mg/dL 50   LDL Calculated 0 - 129 mg/dL 894   Triglycerides 34 - 149 mg/dL 889   Cholesterol / HDL Ratio Index 3.5   VLDL Calculated 10 - 40 mg/dL 22     Current Lipid values within acceptable limits on appropriate intensity statin medication.    Noted history of elevated - LDL   Has hx of taking Repatha , reports he stopped Repatha  because it has increased sugars  On Simvastatin  20 mg QHS at  present.  Given dietary adjustments.     Follow UP    No follow-ups on file.     1. Uncontrolled type 2 diabetes mellitus with hyperglycemia (CMS/HCC)  - POCT Glucose; Future  - POCT Glucose  - SITagliptin (JANUVIA) 25 MG tablet; Take 1 tablet (25 mg) by mouth once daily  Dispense: 30 tablet; Refill: 11    2. Long-term insulin  use (CMS/HCC)  - POCT Glucose; Future  - POCT Glucose  - SITagliptin (JANUVIA) 25 MG tablet; Take 1 tablet (25 mg) by mouth once daily  Dispense: 30 tablet; Refill: 11       *A total of 30 minutes includes previsit review of patient's chart and medical records, visit discussion, patient education and counseling, post visit documentation of assessment and plan, placement of lab orders, imaging, referrals, prescriptions and coordination of care. >50% were in was spent discussing and counseling the patient on the various evaluative and therapeutic options, as discussed above.      *This note was generated by the Epic EMR system/ Dragon speech recognition and may contain inherent errors or omissions not intended by the user. Grammatical errors, random word insertions, deletions, pronoun errors and incomplete sentences are occasional consequences of this technology due to software limitations. Not all errors are caught or corrected. If there are questions or concerns about the content of this note or information contained within the body of this dictation they should be addressed directly with the author for clarification.*          [1]   Patient Active Problem List  Diagnosis    Uncontrolled type 2 diabetes mellitus with hyperglycemia (CMS/HCC)    Mixed hyperlipidemia    Obesity (BMI 30-39.9)    Neuropathy    Stroke (CMS/HCC)    Essential hypertension    Diabetic peripheral neuropathy (CMS/HCC)    History of hemorrhagic stroke with residual hemiparesis (CMS/HCC)    Pancreas cyst    Breast pain, left    Perforated abdominal viscus    Anemia, unspecified type    Pure hypercholesterolemia,  unspecified    Bilateral carotid artery disease, unspecified type    Fibromyalgia muscle pain    Morbid (severe) obesity due to excess calories (CMS/HCC)    History of CVA (cerebrovascular accident)   [2]   Outpatient Medications Marked as Taking for the 07/29/24 encounter (Office Visit) with Aida Muskrat, FNP   Medication Sig Dispense Refill    aspirin  81 MG chewable tablet Chew 1 tablet (81 mg) by mouth daily      B Complex Vitamins (Vitamin-B Complex) Tablet Take by mouth once daily      clobetasol (TEMOVATE) 0.05 % external solution APPLY TOPICALLY TO THE AFFECTED AREA DAILY AT BEDTIME AS NEEDED  Cyanocobalamin (VITAMIN B-12 PO) Take by mouth once daily      empagliflozin  (Jardiance ) 25 MG tablet Take 1 tablet (25 mg) by mouth daily 60 tablet 5    glucose blood (Accu-Chek Guide Test) test strip Type 2 Diabetes Mellitus, insulin  requiring(ICD-10 E11.9) Check fingerstick glucose AC and HS 400 strip 3    glucose blood test strip Check sugar three times a day, ICD 10 E11.9 100 each 10    hydrocortisone 2.5 % cream as needed      Insulin  Lispro-aabc, 1 U Dial, (Lyumjev  KwikPen) 100 UNIT/ML Solution Pen-injector Inject 7 Units into the skin once daily Prior to meals 15 mL 3    Insulin  Pen Needle (BD Pen Needle Nano 2nd Gen) 32G X 4 MM Misc Type 2 Diabetes Mellitus, insulin  requiring(ICD-10 E11.9) Check fingerstick glucose AC and HS 200 each 11    Lancets (accu-chek safe-t pro) lancets Type 2 Diabetes Mellitus, insulin  requiring(ICD-10 E11.9) Check fingerstick glucose AC and HS 200 each 5    Lantus  SoloStar 100 UNIT/ML injection pen Inject 16 Units into the skin once at bedtime (Patient taking differently: Inject 14 Units into the skin once at bedtime) 15 mL 3    LORazepam  (ATIVAN ) 0.5 MG tablet Take 0.5 tablets (0.25 mg) by mouth nightly as needed for Anxiety 20 tablet 0    losartan  (COZAAR ) 50 MG tablet Take 1 tablet (50 mg) by mouth 2 (two) times daily 60 tablet 5    OneTouch Delica Lancets 30G Misc Test  sugars x2/ day 100 each 3    Pitavastatin  Calcium  1 MG Tablet ONE TABB PO QHS 90 tablet 0    triamcinolone (KENALOG) 0.1 % ointment APPLY TO THE AFFECTED AREA ON CHEST AND BACK TWICE DAILY. USE MAX 2 WEEKS PER MONTH. AVOID FACE UNDERARMS AND GROIN      verapamil  (VERELAN ) 180 MG 24 hr ER capsule Take 1 capsule (180 mg) by mouth once daily 90 capsule 1    vitamin D , ergocalciferol , (DRISDOL ) 50000 UNIT Cap Take 1 capsule (50,000 Units) by mouth once a week 12 capsule 1   [3]   Allergies  Allergen Reactions    Codeine Nausea And Vomiting    Crestor  [Rosuvastatin ] Rash     D/c due to severe rash and higher blood sugars    Glucosamine Hives and Nausea And Vomiting    Latex Rash    Tree Nuts Hives     ESPECIALLY ALMONDS     Almond Oil     Coconut (Cocos Nucifera)     Dairy [Milk-Related Compounds]     Egg-Derived Products      Patient had an allergy test and stated that she was allergic to eggs    Gabapentin      Dizziness and rash, irregular heart rate    Gluten Meal     Invokana  [Canagliflozin ]     Levemir  [Insulin  Detemir] Nausea And Vomiting    Lyrica [Pregabalin]      PT STATES IT MAKES HER NEUROPATHY WORSE STATES SHE FEELS LIKE SHE IS ON FIRE    Metformin       diarrhea    Novolog  [Insulin  Aspart (Human Analog) (Yeast)] Hives    Repatha  [Evolocumab ] Headaches    Sucralfate Nausea And Vomiting     BODY ACHES, FEVER    Tresiba  [Insulin  Degludec]     Victoza  [Liraglutide ] Nausea And Vomiting    Flaxseed [Flaxseed (Linseed)] Rash    Omega 3 [Fish Oil] Rash    Peanut-Containing Drug Products  Rash    Repaglinide  Anxiety   [4]   Past Medical History:  Diagnosis Date    Anxiety     Asthma     allergic reaction to mold, not since 20 years, HAS WHEEZING IN COLD WEATHER. NO INHALERS    Bowel perforation (CMS/HCC)     COLONOSCOPY 09/2019    Depression     Diabetes (CMS/HCC)     Difficulty walking     WEAK RIGHT LEG AFTER CVA 2021    Dysphagia     BIG PILLS ONLY     Gastroesophageal reflux disease     PREVACID OTC PRN      Hyperlipemia     Hypertensive disorder     Hypokalemia     CMP REQ PCP 09/14/20    Neuropathy     NUMBNESS RIGHT UE AND LE     Pain     Legs/Arms/Back    Post-operative nausea and vomiting     ALWAYS WITH ANESTHESIA     Sleep apnea 2017    NO CPAP NEEDED PER PT     Stroke (CMS/HCC) 2012     ARM, LEG WEAK. WALKS WITH CANE, WALKER    TIA (transient ischemic attack)     MULTIPLE BEFORE CVA 2012    Type 2 diabetes mellitus, controlled (CMS/HCC) 2021    MANAGED BY DR CLARETTA RAO. AM FSB 117-140. TAKES LANTUS  PRN IN AM. HAIC 7.9    Urinary incontinence    [5]   Past Surgical History:  Procedure Laterality Date    BREAST BIOPSY Left     benign lump biopsy    COLONOSCOPY, DIAGNOSTIC (SCREENING)      X 2    COLONOSCOPY, DIAGNOSTIC (SCREENING) N/A 09/17/2020    Procedure: COLONOSCOPY;  Surgeon: Nemiah Lamar BIRCH, MD;  Location: [facility];  Service: Gastroenterology;  Laterality: N/A;    EGD N/A 10/28/2020    Procedure: EGD w/ bx's;  Surgeon: Marvis Waunita RAMAN, MD;  Location: [facility];  Service: Gastroenterology;  Laterality: N/A;    GALLBLADDER SURGERY  03/14/2023    HYSTERECTOMY      2005    OOPHORECTOMY Bilateral     2005    REDUCTION MAMMAPLASTY      TONSILLECTOMY      urinary bladder      BLADDER LIFT    [6]   Family History  Problem Relation Name Age of Onset    Heart attack Mother      Diabetes Father      Diabetes Maternal Grandmother      Stroke Maternal Grandmother      Stroke Maternal Grandfather      Breast cancer Daughter  30    Colon cancer Neg Hx     [7]   Social History  Tobacco Use    Smoking status: Never     Passive exposure: Never    Smokeless tobacco: Never   Vaping Use    Vaping status: Never Used   Substance Use Topics    Alcohol use: Never    Drug use: Never

## 2024-07-29 NOTE — Patient Instructions (Addendum)
 Hello     You saw Emily Durand, NP on 07/29/2024 for follow up on blood sugar management   The following issues were addressed:  AIC Goals Current A1c has increased to  11.3%   Plan Going Forward       Check blood sugars 4 times a day: Before breakfast, lunch, dinner and bedtime: Record for dosage adjustments  START Januvia 25 mg daily   Cont. Lymjev  7 with breakfast, Lunch, and Dinner: If you do not eat a meal, do not take  Cont. Lantus  16 units at bedtime   Cont. Jardiance  25 mg daily   Contact Endocrinology for blood sugars < 70 mg/dl and persistent high's of > 300 mg/dl     Dietary and Lifestyle modification:  Exercises & Food Intake  Limit intake of refined carbohydrates; such bread, rice, and pasta, refined sugars and sugar sweetened beverages   Referral: Dr. Wilhelmena     Labs are due:    Follow up:

## 2024-07-29 NOTE — Telephone Encounter (Addendum)
 Copied from CRM [id number]. Topic: Clinical Support - Prescription Refill  >> Jul 29, 2024 12:19 PM Chaleque F wrote:  Note to scheduling agent: This telephone encounter should be sent to the clinical/nurse pool. This telephone encounter should be sent as normal priority.      Patients pharmacy: [place] with   [id number] [place], [place] - [address] AT [facility] [address]  [address]  [place] [address]  Phone: [phone] Fax: [phone]    [email or web address] [name], [place] - [address]  [address]  [place] [address]  Phone: [phone] Fax: [phone]    [facility] [place], [place] - [address] [facility] [address]  [address] [place] [address]  Phone: [phone] Fax: [phone]   called in regarding clarification on a prescription sent by the provider.    Medication name, strength, directions of requested refill(s) sent to pharmacy: SITagliptin (JANUVIA) 25 MG tablet, Take 1 tablet (25 mg) by mouth once daily     What clarification is needed: Pt has an active Rx on file for Jardiance . Pharmacist would like to verify which medication the pt should be taking, as Jardiance  and Januvia are the same class of medication.       Additional Notes:

## 2024-07-30 ENCOUNTER — Other Ambulatory Visit (HOSPITAL_BASED_OUTPATIENT_CLINIC_OR_DEPARTMENT_OTHER): Payer: Self-pay | Admitting: Internal Medicine

## 2024-07-30 ENCOUNTER — Ambulatory Visit (INDEPENDENT_AMBULATORY_CARE_PROVIDER_SITE_OTHER)

## 2024-07-30 ENCOUNTER — Other Ambulatory Visit (FREE_STANDING_LABORATORY_FACILITY)

## 2024-07-30 DIAGNOSIS — I10 Essential (primary) hypertension: Secondary | ICD-10-CM

## 2024-07-30 DIAGNOSIS — Z23 Encounter for immunization: Secondary | ICD-10-CM

## 2024-07-30 DIAGNOSIS — E1142 Type 2 diabetes mellitus with diabetic polyneuropathy: Secondary | ICD-10-CM

## 2024-07-30 DIAGNOSIS — E782 Mixed hyperlipidemia: Secondary | ICD-10-CM

## 2024-07-30 DIAGNOSIS — E1165 Type 2 diabetes mellitus with hyperglycemia: Secondary | ICD-10-CM

## 2024-07-30 DIAGNOSIS — Z8673 Personal history of transient ischemic attack (TIA), and cerebral infarction without residual deficits: Secondary | ICD-10-CM

## 2024-07-30 LAB — COMPREHENSIVE METABOLIC PANEL
ALT: 21 U/L (ref <=55)
AST (SGOT): 24 U/L (ref <=41)
Albumin/Globulin Ratio: 1.4 (ref 0.9–2.2)
Albumin: 4.2 g/dL (ref 3.5–4.9)
Alkaline Phosphatase: 95 U/L (ref 37–117)
Anion Gap: 7 (ref 5.0–15.0)
BUN: 18 mg/dL (ref 7–21)
Bilirubin, Total: 0.5 mg/dL (ref 0.2–1.2)
CO2: 29 meq/L (ref 17–29)
Calcium: 9.4 mg/dL (ref 7.9–10.2)
Chloride: 105 meq/L (ref 99–111)
Creatinine: 0.7 mg/dL (ref 0.4–1.0)
GFR: >60 mL/min/1.73 m2 (ref >=60.0)
Globulin: 2.9 g/dL (ref 2.0–3.6)
Glucose: 178 mg/dL — ABNORMAL HIGH (ref 70–100)
Hemolysis Index: 10 {index}
Potassium: 4.4 meq/L (ref 3.5–5.3)
Protein, Total: 7.1 g/dL (ref 6.0–8.3)
Sodium: 141 meq/L (ref 135–145)

## 2024-07-30 LAB — URINE MICROALBUMIN, RANDOM
Urine Creatinine: 47 mg/dL
Urine Microalbumin/Creatinine Ratio: 26 ug/mg (ref <30)
Urine Microalbumin: 12 ug/mL (ref 0.0–30.0)

## 2024-07-30 LAB — LIPID PANEL
Cholesterol / HDL Ratio: 3.6 {index}
Cholesterol: 166 mg/dL (ref <=199)
HDL: 46 mg/dL (ref >=40)
LDL Calculated: 100 mg/dL — ABNORMAL HIGH (ref 0–99)
Triglycerides: 112 mg/dL (ref 34–149)
VLDL Calculated: 18 mg/dL (ref 10–40)

## 2024-07-30 NOTE — Progress Notes (Signed)
 Patient presented to the office for Flu vaccine administration. Verified allergies with patient and previous vaccine reactions. Received injection in the Left arm. No reaction was noted and patient left in good condition.

## 2024-07-31 ENCOUNTER — Ambulatory Visit (INDEPENDENT_AMBULATORY_CARE_PROVIDER_SITE_OTHER): Payer: Self-pay | Admitting: Family Medicine

## 2024-08-01 ENCOUNTER — Ambulatory Visit (HOSPITAL_BASED_OUTPATIENT_CLINIC_OR_DEPARTMENT_OTHER): Admitting: Family Nurse Practitioner

## 2024-08-02 NOTE — Telephone Encounter (Signed)
 Nurse returned call from [facility] pharmacy ([address]/[place]) in regards to confirming if Ms. Brocato should be taking both Januvia and Jardiance  simultaneously. Nurse spoke with pharmacist Marciana) confirming that Ms. Amodio should be taking both Januvia 25 mg and Jardiance  25 mg daily as prescribed by provider Judi Durand) on 07/29/24. Macario verbalized her understanding and stated that she will make a note in Ms. Krizek' record so we will not receive another call asking the same thing. Nurse voiced understanding. No further action was taken.      Reena Ruth, LPN II  0:62 am

## 2024-08-16 ENCOUNTER — Ambulatory Visit (INDEPENDENT_AMBULATORY_CARE_PROVIDER_SITE_OTHER): Admitting: Cardiovascular Disease

## 2024-08-16 ENCOUNTER — Encounter (INDEPENDENT_AMBULATORY_CARE_PROVIDER_SITE_OTHER): Payer: Self-pay | Admitting: Cardiovascular Disease

## 2024-08-16 VITALS — BP 161/88 | HR 78 | Ht 65.0 in | Wt 196.0 lb

## 2024-08-16 DIAGNOSIS — R42 Dizziness and giddiness: Secondary | ICD-10-CM

## 2024-08-16 DIAGNOSIS — R079 Chest pain, unspecified: Secondary | ICD-10-CM

## 2024-08-16 DIAGNOSIS — R002 Palpitations: Secondary | ICD-10-CM

## 2024-08-16 DIAGNOSIS — E1165 Type 2 diabetes mellitus with hyperglycemia: Secondary | ICD-10-CM

## 2024-08-16 DIAGNOSIS — R55 Syncope and collapse: Secondary | ICD-10-CM

## 2024-08-16 DIAGNOSIS — I10 Essential (primary) hypertension: Secondary | ICD-10-CM

## 2024-08-16 LAB — ECG 12-LEAD
Atrial Rate: 71 {beats}/min
IHS MUSE NARRATIVE AND IMPRESSION: NORMAL
P Axis: 40 degrees
P-R Interval: 156 ms
Q-T Interval: 432 ms
QRS Duration: 130 ms
QTC Calculation (Bezet): 469 ms
R Axis: 7 degrees
T Axis: 138 degrees
Ventricular Rate: 71 {beats}/min

## 2024-08-16 NOTE — Progress Notes (Signed)
 [place]  HEART CARDIOLOGY OFFICE PROGRESS NOTE    HRT [facility] OFFICE  [place]  HEART [facility] OFFICE -CARDIOLOGY  [address]  [place] [address]  Dept: [phone]  Dept Fax: [phone]       Patient Name: Emily Rowe    Date of Visit:  August 16, 2024  Date of Birth: Jan 20, 1951  AGE: 73 y.o.  Medical Record #: [id number]  Requesting Physician: Ardeth ONEIDA Commodore, MD      CHIEF COMPLAINT: Palpitations, Dizziness, and Loss of Consciousness      HISTORY OF PRESENT ILLNESS:    She is a pleasant 73 y.o. female who presents today for a follow-up visit.  The patient was last seen by Dr. Julio in February 2024.  History of poorly controlled diabetes as well as poorly controlled hypertension and prior hemorrhagic stroke in 2012.  At that visit an echocardiogram was requested which was reassuring.    She comes in now with episodes of dizziness, weakness, and what sounds like syncope.  Her sister passed away in Mar 10, 2024, and since then her bone health has deteriorated.  A1c was markedly elevated, apparently coming down at 10.6 by her most recent blood work.  Blood pressure is usually not as high as it is in the office.  She has had a prior stroke, and has fibromyalgia, so overall functional status and ability to exercise is compromised.  She describes episodes what sounds like syncope where she then comes to and has chest pain and a rapid heartbeat.      PAST MEDICAL HISTORY: She has a past medical history of Anxiety, Asthma, Bowel perforation (CMS/HCC), Depression, Diabetes (CMS/HCC), Difficulty walking, Dysphagia, Gastroesophageal reflux disease, Hyperlipemia, Hypertensive disorder, Hypokalemia, Neuropathy, Pain, Post-operative nausea and vomiting, Sleep apnea (2017), Stroke (CMS/HCC) (2012), TIA (transient ischemic attack), Type 2 diabetes mellitus, controlled (CMS/HCC) (2021), and Urinary incontinence. She has a past surgical history that includes Tonsillectomy; Reduction mammaplasty;  urinary bladder; COLONOSCOPY, DIAGNOSTIC (SCREENING); COLONOSCOPY, DIAGNOSTIC (SCREENING) (N/A, 09/17/2020); EGD (N/A, 10/28/2020); Hysterectomy; Oophorectomy (Bilateral); Breast biopsy (Left); and Gallbladder surgery (03/14/2023).    Allergies  Reviewed by Isaias Mater, Silvia, MA on 08/16/2024        Severity Reactions Comments    Codeine High Nausea And Vomiting     Crestor  [rosuvastatin ] High Rash D/c due to severe rash and higher blood sugars    Glucosamine High Hives, Nausea And Vomiting     Latex High Rash     Tree Nuts High Hives ESPECIALLY ALMONDS     Almond Oil Not Specified      Coconut (cocos Nucifera) Not Specified      Dairy [milk-related Compounds] Not Specified      Egg-derived Products Not Specified  Patient had an allergy test and stated that she was allergic to eggs    Gabapentin Not Specified  Dizziness and rash, irregular heart rate    Gluten Meal Not Specified      Invokana  [canagliflozin ] Not Specified      Levemir  [insulin  Detemir] Not Specified Nausea And Vomiting     Lyrica [pregabalin] Not Specified  PT STATES IT MAKES HER NEUROPATHY WORSE STATES SHE FEELS LIKE SHE IS ON FIRE    Metformin  Not Specified  diarrhea    Novolog  [insulin  Aspart (human Analog) (yeast)] Not Specified Hives     Repatha  [evolocumab ] Not Specified Headaches     Sucralfate Not Specified Nausea And Vomiting BODY ACHES, FEVER    Tresiba  [insulin  Degludec] Not Specified      Victoza  [  liraglutide ] Not Specified Nausea And Vomiting     Flaxseed [flaxseed (linseed)] Low Rash     Omega 3 [fish Oil] Low Rash     Peanut-containing Drug Products Low Rash     Repaglinide  Low Anxiety              MEDICATIONS:   Patient's current medications were reviewed. ONLY Cardiac medications were updated unless others were addressed in assessment and plan.    Current Outpatient Medications (Relevant to Cardiology)   Medication Sig    aspirin  Chew 1 tablet (81 mg) by mouth daily    verapamil  Take 1 capsule (180 mg) by mouth once daily     Pitavastatin  Calcium  ONE TABB PO QHS    losartan  Take 1 tablet (50 mg) by mouth 2 (two) times daily    Jardiance  TAKE ONE TABLET BY MOUTH DAILY     Current Outpatient Medications (Other)   Medication Sig    glucose blood Check sugar three times a day, ICD 10 E11.9    hydrocortisone as needed    LORazepam  Take 0.5 tablets (0.25 mg) by mouth nightly as needed for Anxiety    OneTouch Delica Lancets 30G Test sugars x2/ day    clobetasol APPLY TOPICALLY TO THE AFFECTED AREA DAILY AT BEDTIME AS NEEDED    triamcinolone APPLY TO THE AFFECTED AREA ON CHEST AND BACK TWICE DAILY. USE MAX 2 WEEKS PER MONTH. AVOID FACE UNDERARMS AND GROIN    UNABLE TO FIND DOISKA-MULTI VITAMIN (Patient not taking: Reported on 08/16/2024)    Cyanocobalamin (VITAMIN B-12 PO) Take by mouth once daily    Vitamin-B Complex Take by mouth once daily    vitamin D  (ergocalciferol ) Take 1 capsule (50,000 Units) by mouth once a week    Lyumjev  KwikPen Inject 7 Units into the skin once daily Prior to meals    Lantus  SoloStar Inject 16 Units into the skin once at bedtime (Patient taking differently: Inject 14 Units into the skin once at bedtime)    BD Pen Needle Nano 2nd Gen Type 2 Diabetes Mellitus, insulin  requiring(ICD-10 E11.9) Check fingerstick glucose AC and HS    Accu-Chek Guide Test Type 2 Diabetes Mellitus, insulin  requiring(ICD-10 E11.9) Check fingerstick glucose AC and HS    accu-chek safe-t pro Type 2 Diabetes Mellitus, insulin  requiring(ICD-10 E11.9) Check fingerstick glucose AC and HS    SITagliptin Take 1 tablet (25 mg) by mouth once daily       FAMILY HISTORY: family history includes Breast cancer (age of onset: 19) in her daughter; Diabetes in her father and maternal grandmother; Heart attack in her mother; Stroke in her maternal grandfather and maternal grandmother.    SOCIAL HISTORY: She reports that she has never smoked. She has never been exposed to tobacco smoke. She has never used smokeless tobacco. She reports that she does not drink  alcohol and does not use drugs.    PHYSICAL EXAMINATION    Visit Vitals  BP 161/88 (BP Site: Left arm, Patient Position: Sitting, Cuff Size: Large)   Pulse 78   Ht 1.651 m (5' 5)   Wt 88.9 kg (196 lb)   BMI 32.62 kg/m       Constitutional: Cooperative, alert, no acute distress.  Overweight  Neck: No carotid bruits, JVP normal.  Cardiac: Regular rate and rhythm, normal S1 and S2; no S3 or S4. No murmurs. No rubs, no gallops.  Pulmonary: Clear to auscultation bilaterally, no wheezing, no rhonchi, no rales.  Extremities: no edema.  Vascular: +  2 pulses in radial artery bilaterally    ECG: Sinus rhythm, left bundle branch block, new compared to 2022      LABS REVIEWED:   Lab Results   Component Value Date    WBC 6.08 12/05/2023    HGB 12.9 12/05/2023    HCT 41.5 12/05/2023    PLT 143 12/05/2023     Lab Results   Component Value Date    GLU 178 (H) 07/30/2024    BUN 18 07/30/2024    CREAT 0.7 07/30/2024    NA 141 07/30/2024    K 4.4 07/30/2024    CL 105 07/30/2024    CO2 29 07/30/2024    AST 24 07/30/2024    ALT 21 07/30/2024     Lab Results   Component Value Date    MG 1.8 07/25/2019    TSH 2.10 11/17/2022    HGBA1C 10.6 (H) 07/24/2024    BNP 24 06/05/2018     Lab Results   Component Value Date    CHOL 166 07/30/2024    TRIG 112 07/30/2024    HDL 46 07/30/2024    LDL 100 (H) 07/30/2024     No results found for: LPACHOL      Most recent echo and nuclear study reviewed.    Carotid September 2024-less than 50% stenosis, mild plaque on the right, mild to moderate plaque on the left    Echo March 2024-EF 70-75%, LVH, trace MR and trace TR      IMPRESSION:   Ms. Friel is a 73 y.o. female with the following problems:    Hypertension.  Poorly controlled  Intolerance to metoprolol, lisinopril , carvedilol , chlorthalidone, nifedipine   Chest pain after what sounds like syncopal episodes, associated rapid heartbeats  Hyperlipidemia-LDL 100 on the follow  Did not tolerate Repatha   Obesity  Insulin -dependent diabetes, uncontrolled  with hemoglobin A1c 10.6 in October 2025  Hemorrhagic stroke 2012  Patient report history of sleep study with no recommendation for CPAP  History of panic attacks and anxiety, fibromyalgia  Bilateral carotid disease less than 50% by Doppler 2022  Abnormal ECG-new left bundle branch block      RECOMMENDATIONS:    Unclear as to exactly what is really happening with her.  The left bundle branch block appears to be new, we will plan to repeat the echo to ensure the ejection fraction has not dropped given what sounds like possible syncopal episodes.  Lexiscan nuclear stress test given her multiple cardiovascular risk factors.  Repeat carotid Doppler study just to ensure that her mild to moderate, nonobstructive disease has not progressed to the point it is obstructive.  APP follow-up in 3 months to discuss results.                                                 Orders Placed This Encounter   Procedures    Lexiscan Nuclear Stress Test    Carotid Duplex    ECG 12 lead (Normal)    Echo 2D Complete    APP Office Visit (HRT [place])       No orders of the defined types were placed in this encounter.      SIGNED:    Davie Slater, MD, [facility], FSCAI      Please pardon any potential grammatical or typographical errors as aspects of this note may have been  created through speech-to-text software.

## 2024-08-20 ENCOUNTER — Other Ambulatory Visit (HOSPITAL_BASED_OUTPATIENT_CLINIC_OR_DEPARTMENT_OTHER): Payer: Self-pay | Admitting: Internal Medicine

## 2024-08-20 ENCOUNTER — Ambulatory Visit
Admission: RE | Admit: 2024-08-20 | Discharge: 2024-08-20 | Disposition: A | Discharge status: Home / Self Care | Source: Ambulatory Visit | Attending: Cardiovascular Disease | Admitting: Cardiovascular Disease

## 2024-08-20 ENCOUNTER — Encounter (HOSPITAL_BASED_OUTPATIENT_CLINIC_OR_DEPARTMENT_OTHER): Payer: Self-pay | Admitting: Internal Medicine

## 2024-08-20 DIAGNOSIS — I10 Essential (primary) hypertension: Secondary | ICD-10-CM | POA: Insufficient documentation

## 2024-08-20 DIAGNOSIS — R079 Chest pain, unspecified: Secondary | ICD-10-CM | POA: Insufficient documentation

## 2024-08-20 DIAGNOSIS — E1165 Type 2 diabetes mellitus with hyperglycemia: Secondary | ICD-10-CM | POA: Insufficient documentation

## 2024-08-20 MED ORDER — TECHNETIUM TC 99M TETROFOSMIN IV KIT
35.0000 | PACK | Freq: Once | INTRAVENOUS | Status: AC | PRN
Start: 2024-08-20 — End: 2024-08-20
  Administered 2024-08-20: 35 via INTRAVENOUS
  Filled 2024-08-20: qty 100

## 2024-08-20 MED ORDER — REGADENOSON 0.4 MG/5ML IV SOLN
0.4000 mg | Freq: Once | INTRAVENOUS | Status: AC | PRN
Start: 2024-08-20 — End: 2024-08-20
  Administered 2024-08-20: 0.4 mg via INTRAVENOUS
  Filled 2024-08-20: qty 5

## 2024-08-20 MED ORDER — TECHNETIUM TC 99M TETROFOSMIN IV KIT
10.0000 | PACK | Freq: Once | INTRAVENOUS | Status: AC | PRN
Start: 2024-08-20 — End: 2024-08-20
  Administered 2024-08-20: 10 via INTRAVENOUS
  Filled 2024-08-20: qty 100

## 2024-08-21 ENCOUNTER — Encounter (INDEPENDENT_AMBULATORY_CARE_PROVIDER_SITE_OTHER): Payer: Self-pay | Admitting: Student

## 2024-08-21 LAB — NM MYOCARDIAL PERFUSION SPECT W STRESS AND REST: Stress LV Ejection Fraction: 55

## 2024-08-22 ENCOUNTER — Other Ambulatory Visit (HOSPITAL_BASED_OUTPATIENT_CLINIC_OR_DEPARTMENT_OTHER): Payer: Self-pay | Admitting: Internal Medicine

## 2024-08-22 DIAGNOSIS — I10 Essential (primary) hypertension: Secondary | ICD-10-CM

## 2024-08-23 ENCOUNTER — Other Ambulatory Visit (INDEPENDENT_AMBULATORY_CARE_PROVIDER_SITE_OTHER): Payer: Self-pay | Admitting: Family Medicine

## 2024-08-24 ENCOUNTER — Other Ambulatory Visit (INDEPENDENT_AMBULATORY_CARE_PROVIDER_SITE_OTHER): Payer: Self-pay | Admitting: Family Medicine

## 2024-08-24 DIAGNOSIS — I69359 Hemiplegia and hemiparesis following cerebral infarction affecting unspecified side: Secondary | ICD-10-CM

## 2024-08-24 DIAGNOSIS — E1165 Type 2 diabetes mellitus with hyperglycemia: Secondary | ICD-10-CM

## 2024-08-24 DIAGNOSIS — E782 Mixed hyperlipidemia: Secondary | ICD-10-CM

## 2024-08-26 NOTE — Telephone Encounter (Signed)
 Medication(s) Requested: pitavastatin     Medication(s) last refilled: Apr 12 2024, Qty 90, Refills 0  Last visit: Jul 25 2024  Patient has an upcoming appointment on December 05 2024. MAW with RN

## 2024-08-30 ENCOUNTER — Ambulatory Visit
Admission: RE | Admit: 2024-08-30 | Discharge: 2024-08-30 | Disposition: A | Discharge status: Home / Self Care | Source: Ambulatory Visit | Attending: Cardiovascular Disease | Admitting: Cardiovascular Disease

## 2024-08-30 ENCOUNTER — Other Ambulatory Visit (HOSPITAL_BASED_OUTPATIENT_CLINIC_OR_DEPARTMENT_OTHER): Payer: Self-pay | Admitting: Internal Medicine

## 2024-08-30 DIAGNOSIS — R55 Syncope and collapse: Secondary | ICD-10-CM | POA: Insufficient documentation

## 2024-08-30 DIAGNOSIS — I10 Essential (primary) hypertension: Secondary | ICD-10-CM

## 2024-09-01 ENCOUNTER — Ambulatory Visit (INDEPENDENT_AMBULATORY_CARE_PROVIDER_SITE_OTHER): Payer: Self-pay | Admitting: Family Medicine

## 2024-09-02 ENCOUNTER — Encounter (INDEPENDENT_AMBULATORY_CARE_PROVIDER_SITE_OTHER): Payer: Self-pay | Admitting: Cardiovascular Disease

## 2024-09-02 LAB — ECHO ADULT TTE COMPLETE
AV Area (Cont Eq VTI): 2.2556
AV Mean Gradient: 3
AV Peak Velocity: 1.34
Ao Root Diameter (2D): 3.3
BP Mod LV Ejection Fraction: 69
IVS Diastolic Thickness (2D): 1.1
LA Dimension (2D): 2.8
LA Volume Index (BP A-L): 33.2482
LVID diastole (2D): 4
LVID systole (2D): 2.5
MV E/A: 0.5455
MV E/e' (Average): 11.9933
Mitral Valve Findings: NORMAL
Prox Ascending Aorta Diameter: 3.8
Pulmonary Valve Findings: NORMAL
RV Basal Diastolic Dimension: 3.4
RV Systolic Pressure: 30.04
TAPSE: 2.5
Tricuspid Valve Findings: NORMAL

## 2024-09-03 ENCOUNTER — Ambulatory Visit
Admission: RE | Admit: 2024-09-03 | Discharge: 2024-09-03 | Disposition: A | Discharge status: Home / Self Care | Source: Ambulatory Visit | Attending: Cardiovascular Disease | Admitting: Cardiovascular Disease

## 2024-09-03 ENCOUNTER — Telehealth: Payer: Self-pay | Admitting: Internal Medicine

## 2024-09-03 ENCOUNTER — Other Ambulatory Visit (HOSPITAL_BASED_OUTPATIENT_CLINIC_OR_DEPARTMENT_OTHER): Payer: Self-pay

## 2024-09-03 DIAGNOSIS — I10 Essential (primary) hypertension: Secondary | ICD-10-CM

## 2024-09-03 DIAGNOSIS — R55 Syncope and collapse: Secondary | ICD-10-CM | POA: Insufficient documentation

## 2024-09-03 DIAGNOSIS — E1165 Type 2 diabetes mellitus with hyperglycemia: Secondary | ICD-10-CM | POA: Insufficient documentation

## 2024-09-03 MED ORDER — VERAPAMIL HCL ER 180 MG PO CP24: 180.0000 mg | ORAL_CAPSULE | Freq: Every day | ORAL | 1 refills | Status: DC

## 2024-09-03 NOTE — Telephone Encounter (Signed)
 Copied from CRM [id number]. Topic: Clinical Support - Prescription Refill  >> Sep 03, 2024 12:22 PM Jazmin C wrote:  Zettler, Reginna ROSARIO called about Clinical Support - Prescription Refill.  Additional details:  >> Sep 03, 2024 12:24 PM Jazmin C wrote:  Name, strength, directions of requested refill(s):  verapamil  (VERELAN ) 180 MG 24 hr ER capsule     How much medication is remaining:     Pharmacy to send refill to or patient to pick up rx from office (mark requested pharmacy in [place]):      [id number] [place], [place] - [address] [facility] [address]  [address]  [place] [address]  Phone: [phone] Fax: [phone]    [email or web address] [name], [place] - [address]  [address]  [place] [address]  Phone: [phone] Fax: [phone]    [facility] [place], [place] - [address]  [address] [place] [address]  Phone: [phone] Fax: [phone]    [facility] [place] - [address]  [address] [place] [address]  Phone: [phone] Fax: [phone]        Please mark X next to the preferred call back number:    Mobile: [phone] (mobile) x   Home: [phone] (home)    Work: @WORKPHONE @        Medication refill request, see above. Thank you   Patient has been informed that medication refill requests should be called in up to one week prior to running out of medication.    Additional Notes:  Pharmacist from [facility] in stating that they have not received prescription and would like for it to be sent again    Next Visit: MM/DD/YYYY

## 2024-09-04 ENCOUNTER — Telehealth (INDEPENDENT_AMBULATORY_CARE_PROVIDER_SITE_OTHER): Payer: Self-pay

## 2024-09-04 NOTE — Telephone Encounter (Signed)
Patient returned my call. Relayed carotid duplex results. Patient verbalized understanding.

## 2024-09-04 NOTE — Telephone Encounter (Addendum)
 Left voicemail to call back for carotid duplex results.    ----- Message from Camellia JONETTA Law, MD sent at 09/04/2024  1:01 PM EST -----  Regarding: carotid  Please let patient know her carotid ultrasound was stable and unchanged compared to 2024.  Continue current medications and follow-up as planned.  Thanks

## 2024-09-09 ENCOUNTER — Ambulatory Visit (HOSPITAL_BASED_OUTPATIENT_CLINIC_OR_DEPARTMENT_OTHER): Admitting: Internal Medicine

## 2024-09-23 ENCOUNTER — Other Ambulatory Visit: Payer: Self-pay

## 2024-09-23 ENCOUNTER — Encounter (HOSPITAL_BASED_OUTPATIENT_CLINIC_OR_DEPARTMENT_OTHER): Payer: Self-pay | Admitting: Internal Medicine

## 2024-09-23 ENCOUNTER — Ambulatory Visit (HOSPITAL_BASED_OUTPATIENT_CLINIC_OR_DEPARTMENT_OTHER): Admitting: Internal Medicine

## 2024-09-23 ENCOUNTER — Other Ambulatory Visit (HOSPITAL_BASED_OUTPATIENT_CLINIC_OR_DEPARTMENT_OTHER)

## 2024-09-23 VITALS — BP 158/84 | HR 75 | Temp 97.6°F | Resp 18 | Ht 66.0 in | Wt 200.8 lb

## 2024-09-23 DIAGNOSIS — E1142 Type 2 diabetes mellitus with diabetic polyneuropathy: Secondary | ICD-10-CM

## 2024-09-23 DIAGNOSIS — I10 Essential (primary) hypertension: Secondary | ICD-10-CM

## 2024-09-23 DIAGNOSIS — E782 Mixed hyperlipidemia: Secondary | ICD-10-CM

## 2024-09-23 DIAGNOSIS — E1165 Type 2 diabetes mellitus with hyperglycemia: Secondary | ICD-10-CM

## 2024-09-23 DIAGNOSIS — Z794 Long term (current) use of insulin: Secondary | ICD-10-CM

## 2024-09-23 MED ORDER — OZEMPIC (0.25 OR 0.5 MG/DOSE) 2 MG/3ML SC SOPN: 0.2500 mg | PEN_INJECTOR | SUBCUTANEOUS | 1 refills | Status: AC

## 2024-09-23 MED ORDER — DEXCOM G7 SENSOR MISC: 1 refills | Status: AC

## 2024-09-23 NOTE — Progress Notes (Addendum)
 [name]    She is on a regimen of 7 units of short-acting insulin  (Lyumjev ) before each meal and 16 units of long-acting insulin  (Lantus ) daily, along with oral medications including Januvia  and Jardiance . Her blood sugar levels fluctuate, with fasting levels around 132 mg/dL and postprandial levels between 150-159 mg/dL. Despite these numbers, her A1c remains high.    She experiences significant pain and weakness in her legs, making it difficult to stand or walk. She reports neuropathy in her legs. She has a history of stroke, which has affected her sensation.    She has a history of gallbladder issues, causing her to feel sick after eating certain foods, particularly those with cream or oil. She avoids eating after 5 PM due to stomach pain. Her diet includes fresh bread with cheese, coffee without sugar, tenderloin, turkey, and oatmeal. She also consumes green apples and cucumber drinks. Despite these dietary adjustments, she does not feel hungry and struggles with constipation.    She has experienced dizziness while exercising, leading to falls. She has a history of chronic constipation since childhood.    Her cholesterol levels have improved, reportedly 50 points lower, which she attributes to dietary changes.    Review of Systems    ROS: A complete 12 point ROS was obtained. Pertinent positives and negatives as noted in HPI. All other systems are negative.    Objective   BP (!) 149/102 (BP Site: Left arm, Patient Position: Sitting, Cuff Size: Large) Comment: Anxiety is elevated  Pulse 76   Temp 97.6 F (36.4 C) (Temporal)   Resp 18   Ht 1.676 m (5' 6)   Wt 91.1 kg (200 lb 12.8 oz)    SpO2 96%   BMI 32.41 kg/m   Physical Exam  Physical Exam  GENERAL: Alert, cooperative, well developed, no acute distress  HEENT: Normocephalic, normal oropharynx, moist mucous membranes  EXTREMITIES: No cyanosis or edema  NEUROLOGICAL: Cranial nerves grossly intact, Moves all extremities without gross motor deficit. Decreased sensation on 10g monofilament testing B/L     Results  LABS  Continuous Glucose Monitor: Fasting 132 mg/dL, Postprandial 849-840 mg/dL  Cholesterol: 50 mg/dL lower (October 7974)      Assessment/Plan     Assessment & Plan  Diabetic polyneuropathy associated with type 2 diabetes mellitus (CMS/HCC)    Orders:    Follow Up In Endocrinology    semaglutide  (Ozempic , 0.25 or 0.5 MG/DOSE,) 2 MG/3ML; Inject 0.25 mg into the skin once a week    Dexcom G7 continuous blood glucose sensor (DEXCOM G7); Change sensor every 15 days    Fructosamine; Future    Follow Up In Endocrinology; Future    Follow Up In Endocrinology; Future    Inadequately controlled diabetes mellitus (CMS/HCC)         Long-term insulin  use (CMS/HCC)         Essential hypertension         Mixed hyperlipidemia            Assessment & Plan  Type 2 diabetes mellitus with hyperglycemia  Blood glucose levels variable; fasting 132 mg/dL, postprandial 849-840 mg/dL. A1c  elevated despite insulin . Anxiety may contribute to hyperglycemia. Previous Victoza  adverse effects. Interested in Ozempic  for weight loss and glycemic control.  - Initiated continuous glucose monitoring for three months.  - Started Ozempic  0.25 mg weekly, titrate based on response.  - Discontinue Januvia  if Ozempic  tolerated.  - Follow-up in two months for A1c evaluation and medication adjustment.    Diabetic neuropathy  Significant neuropathic pain in legs affecting mobility and exercise. Chronic condition impacting quality of life.  - Continue current management and monitor symptoms.    Gallbladder disease  Gallbladder discomfort post fatty foods. Previous infection noted.  Symptoms linked to dietary choices.  - Avoid fatty foods to manage symptoms.    Constipation  Chronic constipation, possibly exacerbated by Ozempic . Long-standing issue.    She will return for a follow up in 4 months, f/u with NP Emily Rowe in 2 months      Emily Claretta Skene MD MPH  Endocrinologist, IM[facility]      Verbal consent obtained to record this visit.

## 2024-09-23 NOTE — Progress Notes (Signed)
 The prescription has been received by the Pharmacy Care Team. A benefit investigation is currently in process.    To follow up on the outcome of the benefit investigation, please check Episodes, Referrals or specialty pharmacy encounter section in the Patient chart review

## 2024-09-23 NOTE — Patient Instructions (Signed)
 VISIT SUMMARY:    Emily Rowe, you visited today to manage your blood sugar levels and discuss other health concerns. Your blood sugar levels have been fluctuating, and despite your current insulin  regimen, your A1c remains high. You also reported significant leg pain and weakness, gallbladder issues, and chronic constipation.    YOUR PLAN:    -TYPE 2 DIABETES MELLITUS WITH HYPERGLYCEMIA: Type 2 diabetes is a condition where your body does not use insulin  properly, leading to high blood sugar levels. We have started you on a continuous glucose monitor for three months to better track your blood sugar levels. You will also begin taking Ozempic  0.25 mg weekly, which may help with weight loss and glycemic control. If you tolerate Ozempic  well, we will discontinue Januvia . Please follow up in two months for an A1c evaluation and possible medication adjustments.    -DIABETIC NEUROPATHY: Diabetic neuropathy is nerve damage caused by high blood sugar levels, leading to pain and weakness in your legs. We will continue with your current management plan and monitor your symptoms.    -GALLBLADDER DISEASE: Gallbladder disease can cause discomfort after eating fatty foods. To manage your symptoms, please avoid fatty foods.    -CONSTIPATION: Constipation is difficulty in passing stools. This has been a long-standing issue for you and may be exacerbated by the new medication, Ozempic . We will monitor this condition closely.    INSTRUCTIONS:    Please follow up in two months for an A1c evaluation and possible medication adjustments. Continue to monitor your blood sugar levels with the continuous glucose monitor and report any significant changes or concerns.

## 2024-09-23 NOTE — Assessment & Plan Note (Addendum)
 Orders:    Follow Up In Endocrinology    semaglutide  (Ozempic , 0.25 or 0.5 MG/DOSE,) 2 MG/3ML; Inject 0.25 mg into the skin once a week    Dexcom G7 continuous blood glucose sensor (DEXCOM G7); Change sensor every 15 days    Fructosamine; Future    Follow Up In Endocrinology; Future    Follow Up In Endocrinology; Future

## 2024-09-24 NOTE — Progress Notes (Signed)
 Outreach Summary  Medication: Ozempic   Outcome: Patient is unable to reach  Additional Notes N/A

## 2024-09-24 NOTE — Progress Notes (Signed)
 Benefits Investigation Summary  Medication: Semaglutide  (Ozempic  (0.25 or 0.5 MG/DOSE))     Prescription: Renewal  Benefits Outcome: Paid     Estimated Rx Copay:  12.15     Additional Notes: UTR PT.  Next Step: Prescription has been released to [id number] - [place], [place] - [address]    :

## 2024-09-25 LAB — FRUCTOSAMINE: Fructosamine: 352 umol/L — ABNORMAL HIGH (ref 205–285)

## 2024-10-07 ENCOUNTER — Ambulatory Visit (HOSPITAL_BASED_OUTPATIENT_CLINIC_OR_DEPARTMENT_OTHER): Payer: Self-pay | Admitting: Internal Medicine

## 2024-10-09 NOTE — Telephone Encounter (Signed)
-----   Message from Joneen Claretta Skene, MD sent at 10/07/2024 12:03 AM EST -----  Fructosamine corresponds to A1c of ~ 8%. Overall sugar control has improved. Thanks  ----- Message -----  From: Lab, Background User  Sent: 09/25/2024   1:28 PM EST  To: Joneen Claretta Skene, MD

## 2024-10-09 NOTE — Telephone Encounter (Signed)
 Nurse called Ms. Rynders in regards to her recent lab result. Patient notified that her fructosamine lab result corresponds to A1c of ~ 8%. Which shows there her overall sugar control has improved. Ms. Lasala verbalized her understanding.      Reena Ruth, LPN II  88:81 am

## 2024-10-25 ENCOUNTER — Encounter (INDEPENDENT_AMBULATORY_CARE_PROVIDER_SITE_OTHER): Payer: Self-pay

## 2024-11-01 ENCOUNTER — Other Ambulatory Visit (HOSPITAL_BASED_OUTPATIENT_CLINIC_OR_DEPARTMENT_OTHER): Payer: Self-pay | Admitting: Family Nurse Practitioner

## 2024-11-01 ENCOUNTER — Telehealth (HOSPITAL_BASED_OUTPATIENT_CLINIC_OR_DEPARTMENT_OTHER): Payer: Self-pay

## 2024-11-01 DIAGNOSIS — E1165 Type 2 diabetes mellitus with hyperglycemia: Secondary | ICD-10-CM

## 2024-11-01 NOTE — Telephone Encounter (Signed)
 Pharmacy requests refill for Jardiance  25 mg tablet    LOV: 09-23-24  NOV: 11-06-24  LABS: 07-2024    Pended for provider review

## 2024-11-01 NOTE — Group Note (Signed)
 Class conducted via Vidyo[employer] app.     Nutrition 101    S & O: Pt attended and actively participated in group class.  Pt is interested in non-surgical weight loss to improve health.    Previous Wt:   Wt Readings from Last 10 Encounters:   09/23/24 200 lb 12.8 oz   08/16/24 196 lb   07/29/24 200 lb   07/25/24 199 lb 12.8 oz   05/01/24 200 lb 6 oz   04/12/24 199 lb   03/18/24 199 lb 6.4 oz   01/12/24 200 lb   12/13/23 205 lb 4 oz   12/05/23 205 lb 4 oz       Nutrition Assessment and Diagnosis: Pt with the condition of overweight or obesity and co-morbidities with ongoing nutrition knowledge deficit as evidenced by initial report;  active participation.  Patient continues to demonstrate motivation to lose weight and change behaviors. Patient has mild nutrition knowledge deficit as evidenced per initial report and continued elevated BMI. Patient was educated during the group session on calories, nutrient density and how to pair appropriate foods together for meals.      Discussion focused on:     1. Behavior Modifications for weight loss  2. Understanding calories and macro-nutrients (protein, fat, and carbohydrates).   3. Designing a nutritious, balanced meal at home and at restaurants using Plate method. Meal and snack ideas provided.  3. Learning how to track food intake using a tracking app, such as Lose It, MyFitnessPal, or Cronometer.   4. Healthier substitutions for sugar-sweetened beverages.   5. The importance of meal timing, proper sleep, adequate hydration, and stress management  6. How 1:1 nutrition counseling can further support weight loss and maintenance efforts.  7. Personalized vs self-guided wellness programs our practice offers for support.     Recommendations:   Daily calorie target of 1,200-1,800 calories and 80-100g protein daily.   Daily fluid intake of 64+ oz clear fluids daily.   150 minutes of moderate intensity or 75 minutes of vigorous physical activity each week.     Materials Provided:   Copy of Powerpoint class slides. Copy of Diet Manual.   Additional education class modules provided:   - Behavioral health: Patience and Self-belief   - Nutrition: How to Use Food Tracking Apps, [employer], and How to Read a Nutrition Label   - Exercise: Runner, Broadcasting/film/video and Aerobic Training     P.   Pt will start goal setting and make small but measureable improvement in meal pattern, food choices, and lifestyle habits.      Educated pt for 60 minutes in a group setting.  Plan reviewed with bariatrician.

## 2024-11-05 ENCOUNTER — Telehealth (INDEPENDENT_AMBULATORY_CARE_PROVIDER_SITE_OTHER): Payer: Self-pay

## 2024-11-05 DIAGNOSIS — I10 Essential (primary) hypertension: Secondary | ICD-10-CM

## 2024-11-05 MED ORDER — VERAPAMIL HCL ER 180 MG PO CP24: 180.0000 mg | ORAL_CAPSULE | Freq: Every day | ORAL | 1 refills | Status: AC

## 2024-11-05 NOTE — Telephone Encounter (Signed)
 Pharmacy called stating that the capsules will be out of stock until mid-February.  They would like to switch to the extended release tablets, which are available.  Rx updated.

## 2024-11-05 NOTE — Telephone Encounter (Signed)
 Pt called back. Patient states she is unable to obtain Verapamil  180mg  CR from [facility]. Pt states she was told to pick another pharmacy and have it sent there. Patient chose [facility]. Pt then stated all [facility] are without the Verapamil . Explained patient needs to choose another pharmacy. Pt requesting medication change. Verapamil  was sent in by her endocrinologist. Pt states both [facility] and endocrine fill it for her.     Patient has several HTN medication intolerances.     Per policy, medication listed in last OV note therefore will send to [facility] [address] per pt request.

## 2024-11-05 NOTE — Telephone Encounter (Addendum)
 Spoke with patient who states her pharm was currently out of Verapamil . At this time patient advised to check with other local pharms to see who has this medicine in stock and let us  know. Patient understood to CB after she gathers this info.

## 2024-11-06 ENCOUNTER — Ambulatory Visit (HOSPITAL_BASED_OUTPATIENT_CLINIC_OR_DEPARTMENT_OTHER): Admitting: Family Nurse Practitioner

## 2024-11-06 ENCOUNTER — Encounter (HOSPITAL_BASED_OUTPATIENT_CLINIC_OR_DEPARTMENT_OTHER): Payer: Self-pay | Admitting: Family Nurse Practitioner

## 2024-11-06 VITALS — BP 145/91 | HR 80 | Ht 66.0 in | Wt 200.2 lb

## 2024-11-06 DIAGNOSIS — I10 Essential (primary) hypertension: Secondary | ICD-10-CM

## 2024-11-06 DIAGNOSIS — Z6832 Body mass index (BMI) 32.0-32.9, adult: Secondary | ICD-10-CM

## 2024-11-06 DIAGNOSIS — Z794 Long term (current) use of insulin: Secondary | ICD-10-CM

## 2024-11-06 DIAGNOSIS — E782 Mixed hyperlipidemia: Secondary | ICD-10-CM

## 2024-11-06 DIAGNOSIS — E1165 Type 2 diabetes mellitus with hyperglycemia: Secondary | ICD-10-CM

## 2024-11-06 DIAGNOSIS — E669 Obesity, unspecified: Secondary | ICD-10-CM

## 2024-11-06 DIAGNOSIS — E1142 Type 2 diabetes mellitus with diabetic polyneuropathy: Secondary | ICD-10-CM

## 2024-11-06 LAB — POCT GLUCOSE: Whole Blood Glucose POCT: 146 mg/dL — AB (ref 70–100)

## 2024-11-06 LAB — POCT HEMOGLOBIN A1C: POCT Hgb A1C: 9.7 % — AB (ref 4.0–5.9)

## 2024-11-06 NOTE — Patient Instructions (Addendum)
 Hello     You saw Emily Durand, NP on 11/06/2024 for follow up on blood sugar management   The following issues were addressed:  AIC 9.7%  Plan Going Forward       Check blood sugars 4 times a day: Before breakfast, lunch, dinner and bedtime: Record for dosage adjustments  Cont Ozempic  0.25 mg weekly   HOLD Lymjev  7 with breakfast, Lunch, and Dinner  Cont. Lantus  16 units at bedtime   Cont. Jardiance  25 mg daily   Contact Endocrinology for blood sugars < 70 mg/dl and persistent high's of > 300 mg/dl     Dietary and Lifestyle modification:  Exercises & Food Intake  Limit intake of refined carbohydrates; such bread, rice, and pasta, refined sugars and sugar sweetened beverages   Referral: Dr. Wilhelmena     Labs are due:  a1c

## 2024-11-06 NOTE — Progress Notes (Signed)
 Date Time: @IPTODAYDATE @ 11:26 AM  Patient Name: Emily Rowe  Attending Physician: No att. providers found    Date: 11/06/2024 11:26 AM   Patient ID: Emily Rowe is a 74 y.o. female.  Chief Complaint:     Chief Complaint   Patient presents with    Diabetes     Follow up: DM2, A1C 9.7(11-06-24).      History of Present Illness:   Emily Rowe is a 74 y.o. female with a Body mass index is 32.31 kg/m. and a history of type 2 diabetes  who presents for management of diabetes mellitus   Visit type: FOLLOW UP   Ref: Claretta Melanee Ransom, MD    Diabete   Type: Type II and non-insulin  requiring  Current symptoms: none  Complications: without complications  Control: 9.1%   Lab Results   Component Value Date    HGBA1C 9.7 (A) 11/06/2024   Comorbid illness: hyperlipidemia      Pertinent History/Interval:    Intolerance to Victoza  and potential risk with Ozempic .       Last follow-up:  08/2024 Dr. KANDICE   AT LAST VISIT:   PLAN==>> - Blood glucose levels variable; fasting 132 mg/dL, postprandial 849-840 mg/dL. A1c elevated despite insulin . Anxiety may contribute to hyperglycemia. Previous Victoza  adverse effects. Interested in Ozempic  for weight loss and glycemic control.  - Initiated continuous glucose monitoring for three months.  - Started Ozempic  0.25 mg weekly, titrate based on response.  - Discontinue Januvia  if Ozempic  tolerated.  - Follow-up in two months for A1c evaluation and medication adjustment.      TODAY   She reports that since starting Ozempic  ==> Her kidney hurts   No changes in her urine. No UTI's   This is the only new medication she has tried.   She does not want to stop it ==> She wants to keep taking it.     She is still on Lantus  16 units + Jardaince 25 mg  She is not taking Lyumjev      She reports checking blood sugars at home t 3 times a day.  She did not bring a glucose log.  She reports her highest blood sugar over the past few weeks to be 155    From memory she reports fasting  values 99, 108, 121, 142   Middays 139, 140,   Dinnertime 132,     She denies any lows      Diabetes Medication Regimen:  Daily basal insulin  dosing: Lantus  10 units at night or every other day   Daily bolus insulin  dosing: non-insulin  requiring  Glipizide  10 mg 2 tabs with breakfast  Jardiance  10 daily     Previous Medications:   Metformin  Unable to tolerate due to diarrhea   Victoza  Unable to tolerate n/v,   Levemir  (caused nausea and vomiting),   Tresiba  (did not tolerate, not sure reason for intolerance)   Repaglinide  (anxiety)     Self-Monitoring Blood Glucose:  Checks Blood Sugars: Yes [x]   No []   Home Glucose Readings:   Fasting:   Before Lunch:  Before Dinner:  Bedtime:  After meals:   Hypoglycemia: Yes []   No [x]     24 hour Meals Recall:  Diet: eating sweet potato and avocado everyday.   Breakfast : moderate compliance with recommended diet;   Lunch :    Dinner :    Snack :    Beverages:   Exercise: walks occasionally     Diabetes Complications:  Ophthalmology Diabetic Screening: last retinal exam was >1 year ago  Ophthalmology: no retinopathy, no glaucoma, no cataracts,  Podiatric Diabetic Screening: last retinal exam was >1 year ago  Podiatry/Neuropathy Podiatric     PROBLEM LIST   Problem List[1]  CURRENT MEDICATIONS   Medications Taking[2]   ALLERGIES   Allergies[3]  PAST MEDICAL HISTORY   Medical History[4]  PAST SURGICAL HISTORY   Past Surgical History[5]  FAMILY HISTORY   Family History[6]  SOCIAL HISTORY   Social History[7]     ROS     ROS: A complete 12 point ROS was obtained. Pertinent positives and negatives as noted in HPI. All other systems are negative.     General/Constitutional:  Denies Chills. Denies Fatigue. Denies Fever.   Ophthalmologic: Denies Blurred vision. Denies Eye Pain.   ENT: Denies Nasal Discharge. Denies Ear pain. Denies Sinus pain.   Endocrine: Denies Polydipsia. Denies Polyuria.   Respiratory: Denies Cough. Denies Orthopnea. Denies Shortness of breath. Denies Wheezing.    Cardiovascular: Denies Chest pain. Denies Chest pain with exertion. Denies Leg Claudication. Denies Palpitations. Denies Swelling in hands/feet.   Gastrointestinal: Denies Abdominal pain. Denies Blood in stool. Denies Constipation. Denies Diarrhea.Denies Heartburn. Denies Nausea. Denies Vomiting.   Genitourinary: Denies Blood in urine. Denies Frequent urination. Denies Painful urination.   Musculoskeletal: Denies Leg cramps. Denies Muscle aches.   Skin: Denies Skin lesion(s).   Neurologic: Denies Dizziness. Denies Gait abnormality. Denies Headache. Denies Tingling/Numbness.     Objective/Physical Exam      GENERAL: alert, in no acute distress, well developed, well nourished,oriented to time, place, and person.   NOSE: normal nasal mucosa.   ORAL CAVITY: normal oropharynx, normal lips, mucosa moist.   THROAT: normal appearance, clear.   NECK/THYROID : neck supple,  no cervical lymphadenopathy,no thyromegaly.  LUNGS: normal effort / no distress, normal breath sounds, clear to auscultation bilaterally, no wheezes.  HEART: S1, S2 normal, no murmurs, regular rate and rhythm.   ABD: Soft, non-distended. Non-tender to palpation. Normoactive bowel sounds.  MUSCK: No joint erythema or edema.  EXTREMITIES: no clubbing, cyanosis, or edema B/L.   LYMPH: No large cervical lymph nodes.  SKIN: Warm, well perfused. No rashes; adequate skin turgor. axillae  PSYCH: Alert, appropriately interactive for age.  PODIATRIC: Diabetes Foot Exam Performed (visual , sensory and pulse):    Monofilament testing of plantar aspect of first toe and metatarsal joints (10g monofilament):  Intact bilaterally, Proporieation  RAD's      No results found.    Labs:     Lab Results   Component Value Date    WBC 6.08 12/05/2023    HGB 12.9 12/05/2023    HCT 41.5 12/05/2023    RBC 4.65 12/05/2023    PLT 143 12/05/2023    MCV 89.2 12/05/2023    MCH 27.7 12/05/2023    MCHC 31.1 (L) 12/05/2023    RDW 13 12/05/2023    MPV 13.9 (H) 12/05/2023     Lab Results    Component Value Date    NA 141 07/30/2024    K 4.4 07/30/2024    CL 105 07/30/2024    CO2 29 07/30/2024    GLU 178 (H) 07/30/2024    BUN 18 07/30/2024    CREAT 0.7 07/30/2024    CA 9.4 07/30/2024    ANIONGAP 7.0 07/30/2024    EGFR >60.0 07/30/2024    PHOS 2.9 06/05/2018    AST 24 07/30/2024    ALT 21 07/30/2024    ALKPHOS 95  07/30/2024    ALB 4.2 07/30/2024    PROT 7.1 07/30/2024    GLOB 2.9 07/30/2024    AGRATIO 1.4 07/30/2024    BILITOTAL 0.5 07/30/2024     Lab Results   Component Value Date    CHOL 166 07/30/2024    TRIG 112 07/30/2024    HDL 46 07/30/2024    LDL 100 (H) 07/30/2024    LABVLDL 18 07/30/2024    CHOLHDLRATIO 3.6 07/30/2024     Lab Results   Component Value Date    TSH 2.10 11/17/2022    T4FREE 1.00 08/09/2022     No results found for: TESTOSTERFRE, TESTOSTERONE  No results found for: DHEA, PROLACTIN, FSH, LH, ESTRADIOL  Lab Results   Component Value Date    VITD 15 (L) 11/17/2022    B12 511 10/29/2019    IRON 86 10/29/2019    TIBC 293 10/29/2019     Lab Results   Component Value Date    Vitamin D , 25 OH, Total 15 (L) 11/17/2022     Lab Results   Component Value Date    PTT 36 07/14/2021     Lab Results   Component Value Date    ALT 21 07/30/2024    AST 24 07/30/2024    ALKPHOS 95 07/30/2024    BILITOTAL 0.5 07/30/2024     Lab Results   Component Value Date    URMICROALB 12.0 07/30/2024    URMICROALB 26 07/30/2024   No components found for: URMICROALBCREAT    Lab Results   Component Value Date    HGBA1C 9.7 (A) 11/06/2024     TRENDS     Lab Results   Component Value Date    HGBA1C 9.7 (A) 11/06/2024    HGBA1C 10.6 (H) 07/24/2024    HGBA1C 11.3 (H) 04/12/2024     Lab Results   Component Value Date    CHOL 166 07/30/2024    CHOL 232 (H) 04/12/2024    CHOL 207 (H) 12/05/2023     Lab Results   Component Value Date    HDL 46 07/30/2024    HDL 53 04/12/2024    HDL 51 12/05/2023     Lab Results   Component Value Date    LDL 100 (H) 07/30/2024    LDL 155 (H) 04/12/2024    LDL 129 (H)  12/05/2023     Lab Results   Component Value Date    TRIG 112 07/30/2024    TRIG 135 04/12/2024    TRIG 136 12/05/2023     Lab Results   Component Value Date    LDL 100 (H) 07/30/2024    LDL 155 (H) 04/12/2024    LDL 129 (H) 12/05/2023     In Office/POCT Labs     Results       Procedure Component Value Units Date/Time    POCT Hemoglobin A1C [[id number]]  (Abnormal) Collected: 11/06/24 1112    Specimen: Blood Updated: 11/06/24 1113     POCT Hgb A1C 9.7 %     POCT Glucose [[id number]]  (Abnormal) Collected: 11/06/24 1112     Updated: 11/06/24 1113     Whole Blood Glucose POCT 146 mg/dL           Results for orders placed or performed in visit on 11/06/24 (from the past 24 hours)   POCT Hemoglobin A1C    Collection Time: 11/06/24 11:12 AM   Result Value    POCT Hgb A1C 9.7 (A)  POCT Glucose    Collection Time: 11/06/24 11:12 AM   Result Value    Whole Blood Glucose POCT 146 (A)     RAD's   No results found.  CGM/PUMP/DATA/MEDIA       Vitals   BP (!) 145/91 (BP Site: Left arm, Patient Position: Sitting, Cuff Size: Large)   Pulse 80   Ht 1.676 m (5' 6)   Wt 90.8 kg (200 lb 3.2 oz)   BMI 32.31 kg/m          BP Readings from Last 3 Encounters:   11/06/24 (!) 145/91   09/23/24 158/84   08/16/24 161/88      Last 3 Weights for the past 72 hrs (Last 3 readings):   Weight   11/06/24 1058 90.8 kg (200 lb 3.2 oz)      Wt Readings from Last 4 Encounters:   11/06/24 1058 90.8 kg (200 lb 3.2 oz)   09/23/24 1020 91.1 kg (200 lb 12.8 oz)   08/16/24 1317 88.9 kg (196 lb)   07/29/24 0910 90.7 kg (200 lb)      Assessment/Plan:   Emily Rowe is a 74 y.o. female with a Body mass index is 32.31 kg/m.  and a history of  type 2 diabetes  who presents for  Visit type: FOLLOW UP   Ref: Claretta Melanee Ransom, MD  Results       Procedure Component Value Units Date/Time    POCT Hemoglobin A1C [[id number]]  (Abnormal) Collected: 11/06/24 1112    Specimen: Blood Updated: 11/06/24 1113     POCT Hgb A1C 9.7 %     POCT Glucose  [[id number]]  (Abnormal) Collected: 11/06/24 1112     Updated: 11/06/24 1113     Whole Blood Glucose POCT 146 mg/dL           Results for orders placed or performed in visit on 11/06/24 (from the past 24 hours)   POCT Hemoglobin A1C    Collection Time: 11/06/24 11:12 AM   Result Value    POCT Hgb A1C 9.7 (A)   POCT Glucose    Collection Time: 11/06/24 11:12 AM   Result Value    Whole Blood Glucose POCT 146 (A)     Patient with a longstanding history of type 2 diabetes mellitus and high A1c's.  At last visit with me, we added Januvia : As she had taken it before.    She was seen by Dr. Susi in November 2025  Januvia  was stopped and She is now on Ozempic . She has been on for 2 weeks     She also reports that she stopped eating as much. She has cut back on carbs and sweets.  A1c is improved    Plan:  She has multiple allergies and intolerance to meds ==> She had a severe reaction to Victoza  requiring hospitalization.   She is now back on one of the newer GLP-1's    I still think microdosing/counting clicks of Ozempic  is best for her.    Will keep her on this current dose for now given the pain in her kidneys.  She wants to stay on  She has been holding Lyumjev ; continue to hold    # Type 2 Diabetes Mellitus   Check blood sugars 4 times a day: Before breakfast, lunch, dinner and bedtime: Record for dosage adjustments  Cont Ozempic  0.25 mg weekly   HOLD Lymjev  7 with breakfast, Lunch, and Dinner  Cont. Lantus  16 units at  bedtime   Cont. Jardiance  25 mg daily       # Obesity       08/16/2024 09/23/2024 11/06/2024   Weight Monitoring   Height 165.1 cm 167.6 cm 167.6 cm   Weight 88.905 kg 91.082 kg 90.81 kg   BMI (calculated) 32.6 kg/m2 32.4 kg/m2 32.3 kg/m2   Noted=> patient has hx of intolerance to glp-1 ra   She wants to loose weight  She was referred to the weight loss center.  She was started on Ozempic  2 weeks ago.  Prior to starting she began to make some dietary changes: Cutting back on carbs and  sugars      Referral to Weight loss and Bariatric: Can discuss other options  Recommend therapeutic lifestyle changes which include optimizing low carbohydrate diet and aerobic exercise efforts.  Continue to work on diet ==> eliminate fast food, sodas or sugar sweetened beverages, reduce carbohydrates, refined sugars, processed foods, increase protein (emphasis on plant based proteins if possible), reduce saturated fats and red meats  Continue on Ozempic  0.25 mg      Lab Results   Component Value Date    TSH 2.10 11/17/2022    T4FREE 1.00 08/09/2022          # Dyslipidemia    Latest Reference Range & Units 05/30/23 09:07   Cholesterol <=199 mg/dL 822   HDL >=59 mg/dL 50   LDL Calculated 0 - 129 mg/dL 894   Triglycerides 34 - 149 mg/dL 889   Cholesterol / HDL Ratio Index 3.5   VLDL Calculated 10 - 40 mg/dL 22     Current Lipid values within acceptable limits on appropriate intensity statin medication.    Noted history of elevated - LDL   Has hx of taking Repatha , reports he stopped Repatha  because it has increased sugars  On Simvastatin  20 mg QHS at present.  Given dietary adjustments.     High Cholesterol/Lipids Recommend patient consume a healthy diet that emphasizes the intake of vegetables, fruits, nuts, whole grains, lean vegetable or animal protein, and fish and minimizes the intake of trans fats, red meat and process  Continue current medication  Ongoing monitoring of lipid panel     # Microalbuminuria  Ratio & Random  Lab Results   Component Value Date    URMICROALB 12.0 07/30/2024    URMICROALB 26 07/30/2024     Lab Results   Component Value Date    GLU 178 (H) 07/30/2024    BUN 18 07/30/2024    CREAT 0.7 07/30/2024    EGFR >60.0 07/30/2024     Microalbumin testing is UTD within past year.   Recommend optimizing therapeutic lifestyle changes which include obtaining at least 150 minutes of aerobic exercise per week and eating a heart healthy diet       # Hypertension   BP (!) 145/91 (BP Site: Left arm,  Patient Position: Sitting, Cuff Size: Large)   Pulse 80   Ht 1.676 m (5' 6)   Wt 90.8 kg (200 lb 3.2 oz)   BMI 32.31 kg/m   BP Readings from Last 3 Encounters:   11/06/24 (!) 145/91   09/23/24 158/84   08/16/24 161/88     Continue Current medication: Verapamil  180 mg + Cozaar  100 mg at night   She will switch to Verapamil  180 mg capsules   She has been off BP meds for 3 days, waiting on the capsules.  It was sent    Continue to optimize low  salt diet and aerobic exercise efforts. Recommend optimizing therapeutic lifestyle changes which include obtaining at least 150 minutes of aerobic exercise per week and eating a heart healthy diet   Recommend out-of-office blood pressure measurements for titration of BP-lowering medication.         Hypoglycemia management (stable):   counseled patient on need to keep sugar source with her at all times, and medic-alert bracelet/indicator specifying diagnosis of diabetes.       Follow-up:   Return in about 6 months (around 05/06/2025).    1. Diabetic polyneuropathy associated with type 2 diabetes mellitus (CMS/HCC)  - Follow Up In Endocrinology  - POCT Glucose; Future  - POCT Hemoglobin A1C  - POCT Glucose  - CBC with Differential (Order); Future  - Comprehensive Metabolic Panel; Future      *A total of 40 minutes includes previsit review of patient's chart and medical records, visit discussion, patient education and counseling, post visit documentation of assessment and plan, placement of lab orders, imaging, referrals, prescriptions and coordination of care. >50% were in was spent discussing and counseling the patient on the various evaluative and therapeutic options, as discussed above.      *This note was generated by the Epic EMR system/ Dragon speech recognition and may contain inherent errors or omissions not intended by the user. Grammatical errors, random word insertions, deletions, pronoun errors and incomplete sentences are occasional consequences of this technology  due to software limitations. Not all errors are caught or corrected. If there are questions or concerns about the content of this note or information contained within the body of this dictation they should be addressed directly with the author for clarification.*          [1]   Patient Active Problem List  Diagnosis    Uncontrolled type 2 diabetes mellitus with hyperglycemia (CMS/HCC)    Mixed hyperlipidemia    Obesity (BMI 30-39.9)    Neuropathy    Stroke (CMS/HCC)    Essential hypertension    Diabetic peripheral neuropathy (CMS/HCC)    History of hemorrhagic stroke with residual hemiparesis (CMS/HCC)    Pancreas cyst    Breast pain, left    Perforated abdominal viscus    Anemia, unspecified type    Pure hypercholesterolemia, unspecified    Bilateral carotid artery disease, unspecified type    Fibromyalgia muscle pain    Morbid (severe) obesity due to excess calories (CMS/HCC)    History of CVA (cerebrovascular accident)   [2]   Outpatient Medications Marked as Taking for the 11/06/24 encounter (Office Visit) with Aida Muskrat, FNP   Medication Sig Dispense Refill    aspirin  81 MG chewable tablet Chew 1 tablet (81 mg) by mouth daily      clobetasol (TEMOVATE) 0.05 % external solution APPLY TOPICALLY TO THE AFFECTED AREA DAILY AT BEDTIME AS NEEDED      Cyanocobalamin (VITAMIN B-12 PO) Take by mouth once daily      empagliflozin  (Jardiance ) 25 MG tablet TAKE 1 TABLET(25 MG) BY MOUTH DAILY 90 tablet 3    glucose blood (Accu-Chek Guide Test) test strip Type 2 Diabetes Mellitus, insulin  requiring(ICD-10 E11.9) Check fingerstick glucose AC and HS 400 strip 3    glucose blood test strip Check sugar three times a day, ICD 10 E11.9 100 each 10    hydrocortisone 2.5 % cream as needed      Insulin  Pen Needle (BD Pen Needle Nano 2nd Gen) 32G X 4 MM Misc Type 2 Diabetes Mellitus, insulin  requiring(ICD-10  E11.9) Check fingerstick glucose AC and HS 200 each 11    Lancets (accu-chek safe-t pro) lancets Type 2 Diabetes Mellitus,  insulin  requiring(ICD-10 E11.9) Check fingerstick glucose AC and HS 200 each 5    Lantus  SoloStar 100 UNIT/ML injection pen Inject 16 Units into the skin once at bedtime 15 mL 3    LORazepam  (ATIVAN ) 0.5 MG tablet Take 0.5 tablets (0.25 mg) by mouth nightly as needed for Anxiety 20 tablet 0    losartan  (COZAAR ) 50 MG tablet Take 1 tablet (50 mg) by mouth 2 (two) times daily (Patient taking differently: Take 1 tablet (50 mg) by mouth once at bedtime Takes 2 tablets at night) 60 tablet 5    OneTouch Delica Lancets 30G Misc Test sugars x2/ day 100 each 3    semaglutide  (Ozempic , 0.25 or 0.5 MG/DOSE,) 2 MG/3ML Inject 0.25 mg into the skin once a week 3 mL 1    triamcinolone (KENALOG) 0.1 % ointment APPLY TO THE AFFECTED AREA ON CHEST AND BACK TWICE DAILY. USE MAX 2 WEEKS PER MONTH. AVOID FACE UNDERARMS AND GROIN (Patient taking differently: Apply topically as needed)      verapamil  (VERELAN ) 180 MG 24 hr ER capsule Take 1 capsule (180 mg) by mouth once daily 90 capsule 1    vitamin D , ergocalciferol , (DRISDOL ) 50000 UNIT Cap Take 1 capsule (50,000 Units) by mouth once a week 12 capsule 1   [3]   Allergies  Allergen Reactions    Codeine Nausea And Vomiting    Crestor  [Rosuvastatin ] Rash     D/c due to severe rash and higher blood sugars    Glucosamine Hives and Nausea And Vomiting    Latex Rash    Tree Nuts Hives     ESPECIALLY ALMONDS     Almond Oil     Coconut (Cocos Nucifera)     Dairy [Milk-Related Compounds]     Egg Protein-Containing Drug Products      Patient had an allergy test and stated that she was allergic to eggs    Gabapentin      Dizziness and rash, irregular heart rate    Gluten Meal     Invokana  [Canagliflozin ]     Levemir  [Insulin  Detemir] Nausea And Vomiting    Lyrica [Pregabalin]      PT STATES IT MAKES HER NEUROPATHY WORSE STATES SHE FEELS LIKE SHE IS ON FIRE    Metformin       diarrhea    Novolog  [Insulin  Aspart (Human Analog) (Yeast)] Hives    Repatha  [Evolocumab ] Headaches    Sucralfate Nausea And  Vomiting     BODY ACHES, FEVER    Tresiba  [Insulin  Degludec]     Victoza  [Liraglutide ] Nausea And Vomiting    Flaxseed [Flaxseed (Linseed)] Rash    Omega 3 [Fish Oil] Rash    Peanut-Containing Drug Products Rash    Repaglinide  Anxiety   [4]   Past Medical History:  Diagnosis Date    Anxiety     Asthma     allergic reaction to mold, not since 20 years, HAS WHEEZING IN COLD WEATHER. NO INHALERS    Bowel perforation (CMS/HCC)     COLONOSCOPY 09/2019    Depression     Diabetes (CMS/HCC)     Difficulty walking     WEAK RIGHT LEG AFTER CVA 2021    Dysphagia     BIG PILLS ONLY     Gastroesophageal reflux disease     PREVACID OTC PRN     Hyperlipemia  Hypertensive disorder     Hypokalemia     CMP REQ PCP 09/14/20    Neuropathy     NUMBNESS RIGHT UE AND LE     Pain     Legs/Arms/Back    Post-operative nausea and vomiting     ALWAYS WITH ANESTHESIA     Sleep apnea 2017    NO CPAP NEEDED PER PT     Stroke (CMS/HCC) 2012     ARM, LEG WEAK. WALKS WITH CANE, WALKER    TIA (transient ischemic attack)     MULTIPLE BEFORE CVA 2012    Type 2 diabetes mellitus, controlled (CMS/HCC) 2021    MANAGED BY DR CLARETTA RAO. AM FSB 117-140. TAKES LANTUS  PRN IN AM. HAIC 7.9    Urinary incontinence    [5]   Past Surgical History:  Procedure Laterality Date    BREAST BIOPSY Left     benign lump biopsy    COLONOSCOPY, DIAGNOSTIC (SCREENING)      X 2    COLONOSCOPY, DIAGNOSTIC (SCREENING) N/A 09/17/2020    Procedure: COLONOSCOPY;  Surgeon: Nemiah Lamar BIRCH, MD;  Location: [facility];  Service: Gastroenterology;  Laterality: N/A;    EGD N/A 10/28/2020    Procedure: EGD w/ bx's;  Surgeon: Marvis Waunita RAMAN, MD;  Location: [facility];  Service: Gastroenterology;  Laterality: N/A;    GALLBLADDER SURGERY  03/14/2023    HYSTERECTOMY      2005    OOPHORECTOMY Bilateral     2005    REDUCTION MAMMAPLASTY      TONSILLECTOMY      urinary bladder      BLADDER LIFT    [6]   Family History  Problem Relation Name Age of Onset    Heart attack  Mother      Diabetes Father      Diabetes Maternal Grandmother      Stroke Maternal Grandmother      Stroke Maternal Grandfather      Breast cancer Daughter  30    Colon cancer Neg Hx     [7]   Social History  Tobacco Use    Smoking status: Never     Passive exposure: Never    Smokeless tobacco: Never   Vaping Use    Vaping status: Never Used   Substance Use Topics    Alcohol use: Never    Drug use: Never

## 2024-12-05 ENCOUNTER — Ambulatory Visit (INDEPENDENT_AMBULATORY_CARE_PROVIDER_SITE_OTHER)

## 2024-12-23 ENCOUNTER — Ambulatory Visit (HOSPITAL_BASED_OUTPATIENT_CLINIC_OR_DEPARTMENT_OTHER): Admitting: Internal Medicine

## 2024-12-25 ENCOUNTER — Encounter (INDEPENDENT_AMBULATORY_CARE_PROVIDER_SITE_OTHER): Admitting: Physician Assistant

## 2025-01-20 ENCOUNTER — Institutional Professional Consult (permissible substitution) (HOSPITAL_BASED_OUTPATIENT_CLINIC_OR_DEPARTMENT_OTHER): Admitting: Internal Medicine

## 2025-05-12 ENCOUNTER — Ambulatory Visit (HOSPITAL_BASED_OUTPATIENT_CLINIC_OR_DEPARTMENT_OTHER): Admitting: Internal Medicine
